# Patient Record
Sex: Female | Born: 1935 | Race: White | Hispanic: No | Smoking: Never smoker
Health system: Southern US, Community
[De-identification: ages and names within clinical notes are randomized; demographics above are authoritative.]

## PROBLEM LIST (undated history)

## (undated) DIAGNOSIS — I10 Essential (primary) hypertension: Secondary | ICD-10-CM

## (undated) DIAGNOSIS — E079 Disorder of thyroid, unspecified: Secondary | ICD-10-CM

## (undated) DIAGNOSIS — K219 Gastro-esophageal reflux disease without esophagitis: Secondary | ICD-10-CM

## (undated) DIAGNOSIS — M1990 Unspecified osteoarthritis, unspecified site: Secondary | ICD-10-CM

## (undated) DIAGNOSIS — C801 Malignant (primary) neoplasm, unspecified: Secondary | ICD-10-CM

## (undated) HISTORY — DX: Gastro-esophageal reflux disease without esophagitis: K21.9

## (undated) HISTORY — DX: Essential (primary) hypertension: I10

## (undated) HISTORY — DX: Malignant (primary) neoplasm, unspecified: C80.1

## (undated) HISTORY — DX: Disorder of thyroid, unspecified: E07.9

## (undated) HISTORY — PX: INTRACAPSULAR CATARACT EXTRACTION: SHX361

## (undated) HISTORY — DX: Unspecified osteoarthritis, unspecified site: M19.90

## (undated) HISTORY — PX: ANKLE SURGERY: SHX546

---

## 1992-09-16 HISTORY — PX: MASTECTOMY: SHX3

## 2000-04-02 ENCOUNTER — Encounter: Admission: RE | Admit: 2000-04-02 | Discharge: 2000-04-02 | Payer: Self-pay | Admitting: *Deleted

## 2000-04-02 ENCOUNTER — Encounter: Payer: Self-pay | Admitting: *Deleted

## 2001-04-02 ENCOUNTER — Encounter: Payer: Self-pay | Admitting: *Deleted

## 2001-04-02 ENCOUNTER — Encounter: Admission: RE | Admit: 2001-04-02 | Discharge: 2001-04-02 | Payer: Self-pay | Admitting: *Deleted

## 2002-03-15 ENCOUNTER — Ambulatory Visit (HOSPITAL_COMMUNITY): Admission: RE | Admit: 2002-03-15 | Discharge: 2002-03-15 | Payer: Self-pay | Admitting: Oncology

## 2002-03-15 ENCOUNTER — Encounter (HOSPITAL_COMMUNITY): Payer: Self-pay | Admitting: Oncology

## 2002-04-05 ENCOUNTER — Encounter: Admission: RE | Admit: 2002-04-05 | Discharge: 2002-04-05 | Payer: Self-pay | Admitting: Oncology

## 2002-04-05 ENCOUNTER — Encounter (HOSPITAL_COMMUNITY): Payer: Self-pay | Admitting: Oncology

## 2003-04-07 ENCOUNTER — Encounter: Admission: RE | Admit: 2003-04-07 | Discharge: 2003-04-07 | Payer: Self-pay | Admitting: Internal Medicine

## 2003-04-07 ENCOUNTER — Encounter: Payer: Self-pay | Admitting: Internal Medicine

## 2004-01-28 ENCOUNTER — Emergency Department (HOSPITAL_COMMUNITY): Admission: EM | Admit: 2004-01-28 | Discharge: 2004-01-28 | Payer: Self-pay | Admitting: Emergency Medicine

## 2004-04-09 ENCOUNTER — Encounter: Admission: RE | Admit: 2004-04-09 | Discharge: 2004-04-09 | Payer: Self-pay | Admitting: Oncology

## 2005-03-20 ENCOUNTER — Ambulatory Visit: Payer: Self-pay | Admitting: Oncology

## 2005-04-11 ENCOUNTER — Encounter: Admission: RE | Admit: 2005-04-11 | Discharge: 2005-04-11 | Payer: Self-pay | Admitting: Internal Medicine

## 2005-06-10 ENCOUNTER — Ambulatory Visit: Payer: Self-pay | Admitting: Internal Medicine

## 2005-06-10 ENCOUNTER — Other Ambulatory Visit: Admission: RE | Admit: 2005-06-10 | Discharge: 2005-06-10 | Payer: Self-pay | Admitting: Internal Medicine

## 2005-06-10 ENCOUNTER — Encounter: Payer: Self-pay | Admitting: Internal Medicine

## 2005-07-24 ENCOUNTER — Ambulatory Visit: Payer: Self-pay | Admitting: Internal Medicine

## 2005-11-06 ENCOUNTER — Ambulatory Visit: Payer: Self-pay | Admitting: Family Medicine

## 2006-03-12 ENCOUNTER — Ambulatory Visit: Payer: Self-pay | Admitting: Oncology

## 2006-03-25 LAB — CBC WITH DIFFERENTIAL/PLATELET
BASO%: 1 % (ref 0.0–2.0)
Eosinophils Absolute: 0.2 10*3/uL (ref 0.0–0.5)
HCT: 36.3 % (ref 34.8–46.6)
LYMPH%: 25.2 % (ref 14.0–48.0)
MCHC: 33.5 g/dL (ref 32.0–36.0)
MONO#: 0.4 10*3/uL (ref 0.1–0.9)
NEUT#: 3.4 10*3/uL (ref 1.5–6.5)
NEUT%: 62.8 % (ref 39.6–76.8)
Platelets: 377 10*3/uL (ref 145–400)
WBC: 5.4 10*3/uL (ref 3.9–10.0)
lymph#: 1.3 10*3/uL (ref 0.9–3.3)

## 2006-03-25 LAB — LACTATE DEHYDROGENASE: LDH: 167 U/L (ref 94–250)

## 2006-03-25 LAB — COMPREHENSIVE METABOLIC PANEL
ALT: 15 U/L (ref 0–40)
CO2: 23 mEq/L (ref 19–32)
Calcium: 8.8 mg/dL (ref 8.4–10.5)
Chloride: 108 mEq/L (ref 96–112)
Glucose, Bld: 94 mg/dL (ref 70–99)
Sodium: 141 mEq/L (ref 135–145)
Total Protein: 7.2 g/dL (ref 6.0–8.3)

## 2006-04-22 ENCOUNTER — Encounter: Admission: RE | Admit: 2006-04-22 | Discharge: 2006-04-22 | Payer: Self-pay | Admitting: Oncology

## 2006-04-30 ENCOUNTER — Encounter: Admission: RE | Admit: 2006-04-30 | Discharge: 2006-04-30 | Payer: Self-pay | Admitting: Oncology

## 2006-06-10 ENCOUNTER — Ambulatory Visit: Payer: Self-pay | Admitting: Internal Medicine

## 2007-03-16 ENCOUNTER — Encounter: Payer: Self-pay | Admitting: Internal Medicine

## 2007-03-16 DIAGNOSIS — Z853 Personal history of malignant neoplasm of breast: Secondary | ICD-10-CM

## 2007-03-16 DIAGNOSIS — M810 Age-related osteoporosis without current pathological fracture: Secondary | ICD-10-CM

## 2007-03-16 DIAGNOSIS — K219 Gastro-esophageal reflux disease without esophagitis: Secondary | ICD-10-CM

## 2007-03-16 DIAGNOSIS — E039 Hypothyroidism, unspecified: Secondary | ICD-10-CM

## 2007-03-16 DIAGNOSIS — M1990 Unspecified osteoarthritis, unspecified site: Secondary | ICD-10-CM

## 2007-03-19 ENCOUNTER — Ambulatory Visit: Payer: Self-pay | Admitting: Oncology

## 2007-03-24 ENCOUNTER — Ambulatory Visit (HOSPITAL_COMMUNITY): Admission: RE | Admit: 2007-03-24 | Discharge: 2007-03-24 | Payer: Self-pay | Admitting: Oncology

## 2007-03-24 LAB — CBC WITH DIFFERENTIAL/PLATELET
BASO%: 0.8 % (ref 0.0–2.0)
EOS%: 3.6 % (ref 0.0–7.0)
MCH: 28.5 pg (ref 26.0–34.0)
MCHC: 33.9 g/dL (ref 32.0–36.0)
MONO#: 0.5 10*3/uL (ref 0.1–0.9)
RBC: 4.27 10*6/uL (ref 3.70–5.32)
RDW: 14.9 % — ABNORMAL HIGH (ref 11.3–14.5)
WBC: 6.2 10*3/uL (ref 3.9–10.0)
lymph#: 1.2 10*3/uL (ref 0.9–3.3)

## 2007-03-24 LAB — COMPREHENSIVE METABOLIC PANEL
ALT: 16 U/L (ref 0–35)
AST: 28 U/L (ref 0–37)
Albumin: 4.2 g/dL (ref 3.5–5.2)
Calcium: 9.2 mg/dL (ref 8.4–10.5)
Chloride: 108 mEq/L (ref 96–112)
Creatinine, Ser: 1.47 mg/dL — ABNORMAL HIGH (ref 0.40–1.20)
Potassium: 4.4 mEq/L (ref 3.5–5.3)
Sodium: 140 mEq/L (ref 135–145)
Total Protein: 6.9 g/dL (ref 6.0–8.3)

## 2007-03-24 LAB — LACTATE DEHYDROGENASE: LDH: 183 U/L (ref 94–250)

## 2007-04-27 ENCOUNTER — Encounter: Admission: RE | Admit: 2007-04-27 | Discharge: 2007-04-27 | Payer: Self-pay | Admitting: Internal Medicine

## 2007-06-09 ENCOUNTER — Encounter: Payer: Self-pay | Admitting: Internal Medicine

## 2007-06-10 ENCOUNTER — Ambulatory Visit: Payer: Self-pay | Admitting: Internal Medicine

## 2007-07-17 ENCOUNTER — Ambulatory Visit: Payer: Self-pay | Admitting: Internal Medicine

## 2007-12-08 ENCOUNTER — Ambulatory Visit: Payer: Self-pay | Admitting: Internal Medicine

## 2007-12-08 LAB — CONVERTED CEMR LAB
ALT: 20 units/L (ref 0–35)
AST: 29 units/L (ref 0–37)
Alkaline Phosphatase: 84 units/L (ref 39–117)
Bilirubin, Direct: 0.1 mg/dL (ref 0.0–0.3)
CO2: 29 meq/L (ref 19–32)
Chloride: 106 meq/L (ref 96–112)
Cholesterol: 234 mg/dL (ref 0–200)
Creatinine, Ser: 1.4 mg/dL — ABNORMAL HIGH (ref 0.4–1.2)
Eosinophils Absolute: 0.3 10*3/uL (ref 0.0–0.6)
Eosinophils Relative: 6 % — ABNORMAL HIGH (ref 0.0–5.0)
GFR calc non Af Amer: 38 mL/min
HCT: 37.3 % (ref 36.0–46.0)
Hemoglobin: 12.1 g/dL (ref 12.0–15.0)
MCHC: 32.4 g/dL (ref 30.0–36.0)
MCV: 84.8 fL (ref 78.0–100.0)
Neutro Abs: 3.2 10*3/uL (ref 1.4–7.7)
Neutrophils Relative %: 62.1 % (ref 43.0–77.0)
Potassium: 4.5 meq/L (ref 3.5–5.1)
Sodium: 143 meq/L (ref 135–145)
TSH: 2.25 microintl units/mL (ref 0.35–5.50)
Total Bilirubin: 0.5 mg/dL (ref 0.3–1.2)
Total CHOL/HDL Ratio: 3.9
Total Protein: 6.8 g/dL (ref 6.0–8.3)
VLDL: 23 mg/dL (ref 0–40)

## 2008-03-21 ENCOUNTER — Ambulatory Visit: Payer: Self-pay | Admitting: Oncology

## 2008-03-24 ENCOUNTER — Encounter: Payer: Self-pay | Admitting: Internal Medicine

## 2008-03-24 LAB — COMPREHENSIVE METABOLIC PANEL
Alkaline Phosphatase: 87 U/L (ref 39–117)
Creatinine, Ser: 1.4 mg/dL — ABNORMAL HIGH (ref 0.40–1.20)
Glucose, Bld: 84 mg/dL (ref 70–99)
Sodium: 139 mEq/L (ref 135–145)
Total Bilirubin: 0.4 mg/dL (ref 0.3–1.2)
Total Protein: 6.8 g/dL (ref 6.0–8.3)

## 2008-03-24 LAB — CBC WITH DIFFERENTIAL/PLATELET
Eosinophils Absolute: 0.3 10*3/uL (ref 0.0–0.5)
HCT: 35.5 % (ref 34.8–46.6)
LYMPH%: 28.5 % (ref 14.0–48.0)
MCHC: 33.9 g/dL (ref 32.0–36.0)
MCV: 83 fL (ref 81.0–101.0)
MONO%: 8.8 % (ref 0.0–13.0)
NEUT#: 2.8 10*3/uL (ref 1.5–6.5)
NEUT%: 56.5 % (ref 39.6–76.8)
Platelets: 335 10*3/uL (ref 145–400)
RBC: 4.28 10*6/uL (ref 3.70–5.32)

## 2008-03-24 LAB — LACTATE DEHYDROGENASE: LDH: 184 U/L (ref 94–250)

## 2008-06-08 ENCOUNTER — Encounter: Admission: RE | Admit: 2008-06-08 | Discharge: 2008-06-08 | Payer: Self-pay | Admitting: Internal Medicine

## 2008-06-16 ENCOUNTER — Ambulatory Visit: Payer: Self-pay | Admitting: Internal Medicine

## 2008-12-06 ENCOUNTER — Ambulatory Visit: Payer: Self-pay | Admitting: Internal Medicine

## 2008-12-06 LAB — CONVERTED CEMR LAB
ALT: 24 units/L (ref 0–35)
AST: 32 units/L (ref 0–37)
Alkaline Phosphatase: 93 units/L (ref 39–117)
Basophils Relative: 1.2 % (ref 0.0–3.0)
CO2: 28 meq/L (ref 19–32)
Chloride: 111 meq/L (ref 96–112)
Eosinophils Absolute: 0.3 10*3/uL (ref 0.0–0.7)
Eosinophils Relative: 5.1 % — ABNORMAL HIGH (ref 0.0–5.0)
Glucose, Bld: 91 mg/dL (ref 70–99)
Hemoglobin: 11.7 g/dL — ABNORMAL LOW (ref 12.0–15.0)
LDL Cholesterol: 114 mg/dL — ABNORMAL HIGH (ref 0–99)
MCHC: 34 g/dL (ref 30.0–36.0)
Neutro Abs: 4 10*3/uL (ref 1.4–7.7)
Platelets: 290 10*3/uL (ref 150.0–400.0)
Potassium: 4.3 meq/L (ref 3.5–5.1)
RDW: 14.1 % (ref 11.5–14.6)
Sodium: 145 meq/L (ref 135–145)
TSH: 0.59 microintl units/mL (ref 0.35–5.50)
Total Bilirubin: 0.7 mg/dL (ref 0.3–1.2)
Total CHOL/HDL Ratio: 4
Total Protein: 6.7 g/dL (ref 6.0–8.3)
Triglycerides: 133 mg/dL (ref 0.0–149.0)
VLDL: 26.6 mg/dL (ref 0.0–40.0)

## 2009-03-28 ENCOUNTER — Ambulatory Visit: Payer: Self-pay | Admitting: Oncology

## 2009-03-30 ENCOUNTER — Encounter: Payer: Self-pay | Admitting: Internal Medicine

## 2009-03-30 LAB — COMPREHENSIVE METABOLIC PANEL
AST: 27 U/L (ref 0–37)
Albumin: 3.9 g/dL (ref 3.5–5.2)
Alkaline Phosphatase: 90 U/L (ref 39–117)
Potassium: 4.7 mEq/L (ref 3.5–5.3)
Sodium: 141 mEq/L (ref 135–145)
Total Protein: 6.5 g/dL (ref 6.0–8.3)

## 2009-03-30 LAB — CBC WITH DIFFERENTIAL/PLATELET
EOS%: 4.3 % (ref 0.0–7.0)
MCH: 27.9 pg (ref 25.1–34.0)
MCHC: 33.4 g/dL (ref 31.5–36.0)
MCV: 83.4 fL (ref 79.5–101.0)
MONO%: 6.3 % (ref 0.0–14.0)
NEUT#: 4.4 10*3/uL (ref 1.5–6.5)
RBC: 4.08 10*6/uL (ref 3.70–5.45)
RDW: 14.9 % — ABNORMAL HIGH (ref 11.2–14.5)

## 2009-06-22 ENCOUNTER — Encounter: Admission: RE | Admit: 2009-06-22 | Discharge: 2009-06-22 | Payer: Self-pay | Admitting: Internal Medicine

## 2009-07-03 ENCOUNTER — Ambulatory Visit: Payer: Self-pay | Admitting: Internal Medicine

## 2009-07-03 DIAGNOSIS — I10 Essential (primary) hypertension: Secondary | ICD-10-CM | POA: Insufficient documentation

## 2009-07-31 ENCOUNTER — Ambulatory Visit: Payer: Self-pay | Admitting: Internal Medicine

## 2009-10-31 ENCOUNTER — Ambulatory Visit: Payer: Self-pay | Admitting: Internal Medicine

## 2010-02-05 ENCOUNTER — Telehealth: Payer: Self-pay | Admitting: Internal Medicine

## 2010-03-27 ENCOUNTER — Ambulatory Visit: Payer: Self-pay | Admitting: Oncology

## 2010-03-29 ENCOUNTER — Encounter: Payer: Self-pay | Admitting: Internal Medicine

## 2010-03-29 LAB — COMPREHENSIVE METABOLIC PANEL
Alkaline Phosphatase: 64 U/L (ref 39–117)
BUN: 33 mg/dL — ABNORMAL HIGH (ref 6–23)
CO2: 19 mEq/L (ref 19–32)
Chloride: 109 mEq/L (ref 96–112)
Creatinine, Ser: 1.82 mg/dL — ABNORMAL HIGH (ref 0.40–1.20)
Total Bilirubin: 0.3 mg/dL (ref 0.3–1.2)

## 2010-03-29 LAB — CBC WITH DIFFERENTIAL/PLATELET
Basophils Absolute: 0.1 10*3/uL (ref 0.0–0.1)
EOS%: 5.6 % (ref 0.0–7.0)
Eosinophils Absolute: 0.3 10*3/uL (ref 0.0–0.5)
MONO%: 7.4 % (ref 0.0–14.0)
NEUT#: 3.6 10*3/uL (ref 1.5–6.5)
NEUT%: 63.3 % (ref 38.4–76.8)
RBC: 3.69 10*6/uL — ABNORMAL LOW (ref 3.70–5.45)
RDW: 14.1 % (ref 11.2–14.5)
lymph#: 1.3 10*3/uL (ref 0.9–3.3)

## 2010-03-29 LAB — LACTATE DEHYDROGENASE: LDH: 157 U/L (ref 94–250)

## 2010-05-02 ENCOUNTER — Ambulatory Visit: Payer: Self-pay | Admitting: Internal Medicine

## 2010-05-02 LAB — CONVERTED CEMR LAB: TSH: 1.91 microintl units/mL (ref 0.35–5.50)

## 2010-06-21 ENCOUNTER — Ambulatory Visit: Payer: Self-pay | Admitting: Internal Medicine

## 2010-06-26 ENCOUNTER — Encounter: Admission: RE | Admit: 2010-06-26 | Discharge: 2010-06-26 | Payer: Self-pay | Admitting: Oncology

## 2010-10-07 ENCOUNTER — Encounter (HOSPITAL_COMMUNITY): Payer: Self-pay | Admitting: Oncology

## 2010-10-16 NOTE — Assessment & Plan Note (Signed)
Summary: FLU SHOT // RS  Nurse Visit   Vitals Entered By: Cay Schillings LPN (October  6, 624THL 11:29 AM)  Allergies: No Known Drug Allergies  Immunizations Administered:  Influenza Vaccine # 1:    Vaccine Type: Fluvax 3+    Site: left deltoid    Mfr: GlaxoSmithKline    Dose: 0.5 ml    Route: IM    Given by: Cay Schillings LPN    Exp. Date: 03/16/2011    Lot #: [id number]    VIS given: 04/10/10 version given June 21, 2010.    Physician counseled: yes  Flu Vaccine Consent Questions:    Do you have a history of severe allergic reactions to this vaccine? no    Any prior history of allergic reactions to egg and/or gelatin? no    Do you have a sensitivity to the preservative Thimersol? no    Do you have a past history of Guillan-Barre Syndrome? no    Do you currently have an acute febrile illness? no    Have you ever had a severe reaction to latex? no    Vaccine information given and explained to patient? yes    Are you currently pregnant? no  Orders Added: 1)  Flu Vaccine 34yrs + [90658] 2)  Admin 1st Vaccine [id number]

## 2010-10-16 NOTE — Assessment & Plan Note (Signed)
Summary: 3 month rov/njr   Vital Signs:  Patient profile:   77 year old female Weight:      150 pounds Temp:     98.7 degrees F oral BP sitting:   130 / 70  (right arm) Cuff size:   regular  Vitals Entered By: Cay Schillings LPN (February 15, 624THL 11:28 AM) CC: rov , f/u on bp    CC:  rov  and f/u on bp .  History of Present Illness:  77 year old patient who is in today for general follow-up.  She has a history of treated hypertension menopausal syndrome and osteoporosis.  She is on soft, normal Synthroid for hypothyroidism.  She has gastroesophageal reflux disease.  She has done well.  She denies any cardiopulmonary complaints.  She has had no difficulty with medication intolerance.  Allergies: No Known Drug Allergies  Past History:  Past Medical History: Reviewed history from 07/03/2009 and no changes required. GERD Hypothyroidism Osteoporosis DJD Breast cancer, hx of stage IIB, moderately differentiated with 3 of a possible lymph nodes.  Status post radical mastectomy in 1994 Hypertension  Family History: Reviewed history from 06/10/2007 and no changes required. father died age 79, lung cancer mother died at 69, cardiac disease two brothers, one sister positive for cardiac disease, alcoholism  Review of Systems  The patient denies anorexia, fever, weight loss, weight gain, vision loss, decreased hearing, hoarseness, chest pain, syncope, dyspnea on exertion, peripheral edema, prolonged cough, headaches, hemoptysis, abdominal pain, melena, hematochezia, severe indigestion/heartburn, hematuria, incontinence, genital sores, muscle weakness, suspicious skin lesions, transient blindness, difficulty walking, depression, unusual weight change, abnormal bleeding, enlarged lymph nodes, angioedema, and breast masses.    Physical Exam  General:  Well-developed,well-nourished,in no acute distress; alert,appropriate and cooperative throughout examination Head:  Normocephalic  and atraumatic without obvious abnormalities. No apparent alopecia or balding. Eyes:  No corneal or conjunctival inflammation noted. EOMI. Perrla. Funduscopic exam benign, without hemorrhages, exudates or papilledema. Vision grossly normal. Mouth:  Oral mucosa and oropharynx without lesions or exudates.  Teeth in good repair. Neck:  No deformities, masses, or tenderness noted. Lungs:  Normal respiratory effort, chest expands symmetrically. Lungs are clear to auscultation, no crackles or wheezes. Heart:  Normal rate and regular rhythm. S1 and S2 normal without gallop, murmur, click, rub or other extra sounds. Abdomen:  Bowel sounds positive,abdomen soft and non-tender without masses, organomegaly or hernias noted. Msk:  No deformity or scoliosis noted of thoracic or lumbar spine.   Extremities:  No clubbing, cyanosis, edema, or deformity noted with normal full range of motion of all joints.     Impression & Recommendations:  Problem # 1:  HYPERTENSION (ICD-401.9)  Her updated medication list for this problem includes:    Lisinopril-hydrochlorothiazide 20-25 Mg Tabs (Lisinopril-hydrochlorothiazide) ..... One daily  Her updated medication list for this problem includes:    Lisinopril-hydrochlorothiazide 20-25 Mg Tabs (Lisinopril-hydrochlorothiazide) ..... One daily  Problem # 2:  DEGENERATIVE JOINT DISEASE (ICD-715.90)  Her updated medication list for this problem includes:    Aspir-low 81 Mg Tbec (Aspirin) ..... Qd  Her updated medication list for this problem includes:    Aspir-low 81 Mg Tbec (Aspirin) ..... Qd  Problem # 3:  HYPOTHYROIDISM (ICD-244.9)  Her updated medication list for this problem includes:    Synthroid 75 Mcg Tabs (Levothyroxine sodium) .Marland Kitchen... 1 once daily  Her updated medication list for this problem includes:    Synthroid 75 Mcg Tabs (Levothyroxine sodium) .Marland Kitchen... 1 once daily  Complete Medication List: 1)  Evista 60 Mg Tabs (Raloxifene hcl) .... Take 1 tablet by  mouth once a day 2)  Prevacid 30 Mg Cpdr (Lansoprazole) .... Take 1 capsule by mouth every morning 3)  Synthroid 75 Mcg Tabs (Levothyroxine sodium) .Marland Kitchen.. 1 once daily 4)  Lisinopril-hydrochlorothiazide 20-25 Mg Tabs (Lisinopril-hydrochlorothiazide) .... One daily 5)  Aspir-low 81 Mg Tbec (Aspirin) .... Qd 6)  Calcium 500 Mg Tabs (Calcium) .... Qd  Patient Instructions: 1)  Please schedule a follow-up appointment in 6 months. 2)  Limit your Sodium (Salt). 3)  It is important that you exercise regularly at least 20 minutes 5 times a week. If you develop chest pain, have severe difficulty breathing, or feel very tired , stop exercising immediately and seek medical attention. 4)  Take calcium +Vitamin D daily. Prescriptions: EVISTA 60 MG TABS (RALOXIFENE HCL) Take 1 tablet by mouth once a day  #90 x 6   Entered and Authorized by:   [name]  MD   Signed by:   [name]  MD on 10/31/2009   Method used:   Print then Give to Patient   RxID:   [id number]

## 2010-10-16 NOTE — Progress Notes (Signed)
Summary: REFILL REQUEST  Phone Note Refill Request   Refills Requested: Medication #1:  EVISTA 60 MG TABS Take 1 tablet by mouth once a day   Notes: Please send or fax to [place].  Medication #2:  PREVACID 30 MG CPDR Take 1 capsule by mouth every morning   Notes: Please send or fax to [place].  Medication #3:  LISINOPRIL-HYDROCHLOROTHIAZIDE 20-25 MG TABS one daily   Notes: Please send or fax to [place].    Initial call taken by: Duanne Moron,  Feb 05, 2010 8:31 AM    Prescriptions: LISINOPRIL-HYDROCHLOROTHIAZIDE 20-25 MG TABS (LISINOPRIL-HYDROCHLOROTHIAZIDE) one daily  #90 x 4   Entered by:   Cay Schillings LPN   Authorized by:   [name]  MD   Signed by:   Cay Schillings LPN on 075-GRM   Method used:   Faxed to ...       [facility]* (mail-order)             ,          Ph: [phone]       Fax: [phone]   RxIDHH:[id number] PREVACID 30 MG CPDR (LANSOPRAZOLE) Take 1 capsule by mouth every morning  #90 x 4   Entered by:   Cay Schillings LPN   Authorized by:   [name]  MD   Signed by:   Cay Schillings LPN on 075-GRM   Method used:   Faxed to ...       [facility]* (mail-order)             ,          Ph: [phone]       Fax: [phone]   RxIDTT:[id number] EVISTA 60 MG TABS (RALOXIFENE HCL) Take 1 tablet by mouth once a day  #90 x 4   Entered by:   Cay Schillings LPN   Authorized by:   [name]  MD   Signed by:   Cay Schillings LPN on 075-GRM   Method used:   Faxed to ...       [facility] (mail-order)             ,          Ph: [phone]       Fax: [phone]   RxID:   [id number]

## 2010-10-16 NOTE — Letter (Signed)
Summary: Mary Guzman   Imported By: Laural Benes 04/12/2010 12:18:12  _____________________________________________________________________  External Attachment:    Type:   Image     Comment:   External Document

## 2010-10-16 NOTE — Assessment & Plan Note (Signed)
Summary: pt will come in fasting/njr rsc bmp/njr   Vital Signs:  Patient profile:   77 year old female Height:      63 inches Weight:      151 pounds BMI:     26.85 Temp:     97.8 degrees F oral Pulse rate:   76 / minute Pulse rhythm:   regular Resp:     16 per minute BP sitting:   120 / 68  (right arm) Cuff size:   regular  Vitals Entered By: Cay Schillings LPN (August 17, 624THL 9:31 AM) CC: cpx - doing well Is Patient Diabetic? No   CC:  cpx - doing well.  History of Present Illness: an 77 year old patient who is seen today for a comprehensive evaluation.  She is accompanied by her daughter, who has noticed some mild cognitive impairment.  She is followed annually by oncology for stage IIB breast cancer with 3 of 8 lymph nodes positive.  She is status post left mastectomy in 1994.  She has hypertension and hypothyroidism. Here for Medicare AWV:  1.   Risk factors based on Past M, S, F history:  cardiovascular risk factors include age, hypertension. 2.   Physical Activities:  fairly sedentary at 77 years of age 27.   Depression/mood: no history of depression or mood disorder 4.   Hearing: no significant impairment 5.   ADL's: requires minimal assistance and aspects of daily living.  No longer drives 6.   Fall Risk: moderate due to her age 77.   Home Safety: no problems identified 8.   Height, weight, &visual acuity:height and weight are stable.  No change in her visual acuity 9.   Counseling: calcium supplementation encouraged 10.   Labs ordered based on risk factors: recent laboratory studies done by oncology will check TSH, only today 11.           Referral Coordination-not appropriate 12.           Care Plan- continue present regimen 13.            Cognitive Assessment- daughters describes some mild forgetfulness   Allergies (verified): No Known Drug Allergies  Past History:  Past Medical History: GERD Hypothyroidism Osteoporosis DJD Breast cancer, hx of stage  IIB, moderately differentiated with 3 of 8  possible lymph nodes.  Status post radical mastectomy in 1994 Hypertension  Past Surgical History: Reviewed history from 12/06/2008 and no changes required. Mastectomy 1994 R ankle surgery gravida two, para two, aborta zero Cataract extraction  Family History: Reviewed history from 06/10/2007 and no changes required. father died age 29, lung cancer mother died at 24, cardiac disease two brothers, one sister positive for cardiac disease, alcoholism  Social History: Reviewed history from 12/06/2008 and no changes required. widowed fairly active with interaction with grandchildren  Review of Systems  The patient denies anorexia, fever, weight loss, weight gain, vision loss, decreased hearing, hoarseness, chest pain, syncope, dyspnea on exertion, peripheral edema, prolonged cough, headaches, hemoptysis, abdominal pain, melena, hematochezia, severe indigestion/heartburn, hematuria, incontinence, genital sores, muscle weakness, suspicious skin lesions, transient blindness, difficulty walking, depression, unusual weight change, abnormal bleeding, enlarged lymph nodes, angioedema, and breast masses.    Physical Exam  General:  elderly alert, no distress.  Blood pressure 120/64 Head:  Normocephalic and atraumatic without obvious abnormalities. No apparent alopecia or balding. Eyes:  No corneal or conjunctival inflammation noted. EOMI. Perrla. Funduscopic exam benign, without hemorrhages, exudates or papilledema. Vision grossly normal. Ears:  External ear exam  shows no significant lesions or deformities.  Otoscopic examination reveals clear canals, tympanic membranes are intact bilaterally without bulging, retraction, inflammation or discharge. Hearing is grossly normal bilaterally. Mouth:  Oral mucosa and oropharynx without lesions or exudates.  dentures in place Neck:  No deformities, masses, or tenderness noted. Chest Wall:  No deformities,  masses, or tenderness noted. Breasts:  left mastectomy Lungs:  Normal respiratory effort, chest expands symmetrically. Lungs are clear to auscultation, no crackles or wheezes. Heart:  Normal rate and regular rhythm. S1 and S2 normal without gallop, murmur, click, rub or other extra sounds. Abdomen:  Bowel sounds positive,abdomen soft and non-tender without masses, organomegaly or hernias noted. Rectal:  No external abnormalities noted. Normal sphincter tone. No rectal masses or tenderness. Genitalia:  atrophic changes only Msk:  No deformity or scoliosis noted of thoracic or lumbar spine.   Pulses:  the left dorsalis pedis pulse intact.  Other peripheral pulses not easily palpable Extremities:  plus one right ankle edema Neurologic:  alert & oriented X3, DTRs symmetrical and normal, and finger-to-nose normal.  alert & oriented X3, DTRs symmetrical and normal, and finger-to-nose normal.   Skin:  Intact without suspicious lesions or rashes Cervical Nodes:  No lymphadenopathy noted Axillary Nodes:  No palpable lymphadenopathy Inguinal Nodes:  No significant adenopathy Psych:  Cognition and judgment appear intact. Alert and cooperative with normal attention span and concentration. No apparent delusions, illusions, hallucinations   Impression & Recommendations:  Problem # 1:  Preventive Health Care (ICD-V70.0)  Orders: Medicare -1st Annual Wellness Visit [phone])  Complete Medication List: 1)  Evista 60 Mg Tabs (Raloxifene hcl) .... Take 1 tablet by mouth once a day 2)  Prevacid 30 Mg Cpdr (Lansoprazole) .... Take 1 capsule by mouth every morning 3)  Synthroid 75 Mcg Tabs (Levothyroxine sodium) .Marland Kitchen.. 1 once daily 4)  Lisinopril-hydrochlorothiazide 20-25 Mg Tabs (Lisinopril-hydrochlorothiazide) .... One daily 5)  Aspir-low 81 Mg Tbec (Aspirin) .... Qd 6)  Calcium 500 Mg Tabs (Calcium) .... Qd  Other Orders: EKG w/ Interpretation (93000) Venipuncture HR:[id number]) TLB-TSH (Thyroid Stimulating  Hormone) (84443-TSH) Specimen Handling (99000)  Patient Instructions: 1)  Please schedule a follow-up appointment in 6 months. 2)  Limit your Sodium (Salt). 3)  It is important that you exercise regularly at least 20 minutes 5 times a week. If you develop chest pain, have severe difficulty breathing, or feel very tired , stop exercising immediately and seek medical attention. 4)  Take calcium +Vitamin D daily. Prescriptions: LISINOPRIL-HYDROCHLOROTHIAZIDE 20-25 MG TABS (LISINOPRIL-HYDROCHLOROTHIAZIDE) one daily  #90 x 4   Entered and Authorized by:   [name]  MD   Signed by:   [name]  MD on 05/02/2010   Method used:   Faxed to ...       [facility] ([facility])             , [place]         Ph:        Fax: [phone]   RxID:   [id number] SYNTHROID 75 MCG TABS (LEVOTHYROXINE SODIUM) 1 once daily  #90 x 4   Entered and Authorized by:   [name]  MD   Signed by:   [name]  MD on 05/02/2010   Method used:   Faxed to ...       [facility])             , [place]         Ph:  Fax: [phone]   RxIDIB:[id number] PREVACID 30 MG CPDR (LANSOPRAZOLE) Take 1 capsule by mouth every morning  #90 x 4   Entered and Authorized by:   [name]  MD   Signed by:   [name]  MD on 05/02/2010   Method used:   Faxed to ...       [facility] (mail-order)             , [place]         Ph:        Fax: [phone]   RxID:   [id number] EVISTA 60 MG TABS (RALOXIFENE HCL) Take 1 tablet by mouth once a day  #90 x 4   Entered and Authorized by:   [name]  MD   Signed by:   [name]  MD on 05/02/2010   Method used:   Faxed to ...       [facility] (mail-order)             , [place]         Ph:        Fax: [phone]   RxID:   [id number]

## 2010-10-30 ENCOUNTER — Encounter: Payer: Self-pay | Admitting: Internal Medicine

## 2010-10-31 ENCOUNTER — Encounter: Payer: Self-pay | Admitting: Internal Medicine

## 2010-10-31 ENCOUNTER — Ambulatory Visit (INDEPENDENT_AMBULATORY_CARE_PROVIDER_SITE_OTHER): Payer: Medicare Other | Admitting: Internal Medicine

## 2010-10-31 DIAGNOSIS — M810 Age-related osteoporosis without current pathological fracture: Secondary | ICD-10-CM

## 2010-10-31 DIAGNOSIS — K219 Gastro-esophageal reflux disease without esophagitis: Secondary | ICD-10-CM

## 2010-10-31 DIAGNOSIS — E039 Hypothyroidism, unspecified: Secondary | ICD-10-CM

## 2010-10-31 DIAGNOSIS — I10 Essential (primary) hypertension: Secondary | ICD-10-CM

## 2010-10-31 MED ORDER — LEVOTHYROXINE SODIUM 75 MCG PO TABS: 75.0000 ug | ORAL_TABLET | Freq: Every day | ORAL | Status: DC

## 2010-10-31 MED ORDER — LANSOPRAZOLE 30 MG PO CPDR: 30.0000 mg | DELAYED_RELEASE_CAPSULE | Freq: Every day | ORAL | Status: DC

## 2010-10-31 MED ORDER — LISINOPRIL-HYDROCHLOROTHIAZIDE 20-25 MG PO TABS: 1.0000 | ORAL_TABLET | Freq: Every day | ORAL | Status: DC

## 2010-10-31 MED ORDER — RALOXIFENE HCL 60 MG PO TABS: 60.0000 mg | ORAL_TABLET | Freq: Every day | ORAL | Status: DC

## 2010-10-31 NOTE — Progress Notes (Signed)
  Subjective:    Patient ID: Mary Guzman, female    DOB: [birth year], 77 y.o.   MRN: [id number]  HPI  An 77 year old patient who is seen today for follow-up of her multiple medical problems.  She has treated hypertension, which has done wel lon combination therapy of lisinopril thiazide diuretic.  She tracks home blood pressure readings with very nice results. She has gastroesophageal reflux disease, controlled on PPI therapy.  She is asymptomatic. She has hypothyroidism on supplemental medication.  Last TSH was normal. She has osteoarthritis and osteoporosis and remains on Evista.   Past Medical History  Diagnosis Date  . GERD (gastroesophageal reflux disease)   . Thyroid disease     Hypothyroidism  . Osteoporosis   . DJD (degenerative joint disease)   . 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  . Hypertension    Past Surgical History  Procedure Date  . Mastectomy 1994  . Ankle surgery     Right  . Intracapsular cataract extraction     reports that she has never smoked. She does not have any smokeless tobacco history on file. Her alcohol and drug histories not on file. family history includes Alcohol abuse in her brother and Asthma in her son.       Review of Systems  Constitutional: Negative.   HENT: Negative for hearing loss, congestion, sore throat, rhinorrhea, dental problem, sinus pressure and tinnitus.   Eyes: Negative for pain, discharge and visual disturbance.  Respiratory: Negative for cough and shortness of breath.   Cardiovascular: Negative for chest pain, palpitations and leg swelling.  Gastrointestinal: Negative for nausea, vomiting, abdominal pain, diarrhea, constipation, blood in stool and abdominal distention.  Genitourinary: Negative for dysuria, urgency, frequency, hematuria, flank pain, vaginal bleeding, vaginal discharge, difficulty urinating, vaginal pain and pelvic pain.  Musculoskeletal: Negative  for joint swelling, arthralgias and gait problem.  Skin: Negative for rash.  Neurological: Negative for dizziness, syncope, speech difficulty, weakness, numbness and headaches.  Hematological: Negative for adenopathy.  Psychiatric/Behavioral: Negative for behavioral problems, dysphoric mood and agitation. The patient is not nervous/anxious.        Objective:   Physical Exam  Constitutional: She is oriented to person, place, and time. She appears well-developed and well-nourished.  HENT:  Head: Normocephalic.  Right Ear: External ear normal.  Left Ear: External ear normal.  Mouth/Throat: Oropharynx is clear and moist.  Eyes: Conjunctivae and EOM are normal. Pupils are equal, round, and reactive to light.  Neck: Normal range of motion. Neck supple. No thyromegaly present.  Cardiovascular: Normal rate, regular rhythm, normal heart sounds and intact distal pulses.   Pulmonary/Chest: Effort normal and breath sounds normal.  Abdominal: Soft. Bowel sounds are normal. She exhibits no mass. There is no tenderness.  Musculoskeletal: Normal range of motion.  Lymphadenopathy:    She has no cervical adenopathy.  Neurological: She is alert and oriented to person, place, and time.  Skin: Skin is warm and dry. No rash noted.  Psychiatric: She has a normal mood and affect. Her behavior is normal.          Assessment & Plan:  Hypertension- stable.  Will continue present regimen.  Medications refill Hypothyroidism.  Levothyroxine refill Osteoporosis.  Will continue the Evista.  Medications refill Gastroesophageal reflux disease-

## 2010-10-31 NOTE — Patient Instructions (Signed)
Limit your sodium (Salt) intake   It is important that you exercise regularly, at least 20 minutes 3 to 4 times per week.  If you develop chest pain or shortness of breath seek  medical attention. Take a calcium supplement, plus [phone] units of vitamin D  Return in 6 months for follow-up  Avoids foods high in acid such as tomatoes citrus juices, and spicy foods.  Avoid eating within two hours of lying down or before exercising.  Do not overheat.  Try smaller more frequent meals.  If symptoms persist, elevate the head of her bed 12 inches while sleeping.

## 2011-02-01 NOTE — Assessment & Plan Note (Signed)
[facility] OFFICE NOTE   NAME:Mary Guzman, Mary Guzman                        MRN:          [id number]  DATE:06/10/2006                            DOB:          July 15, 1921    The patient is an 77 year old female, seen today for a comprehensive exam.   MEDICAL PROBLEMS:  1. Hypothyroidism.  2. Osteoporosis.  3. Remote history of breast cancer in 1994.  4. She has gastroesophageal reflux disease.  5. She is gravida 2, para 2, abortus 0.   FAMILY HISTORY:  Positive for lung cancer.   PHYSICAL EXAMINATION:  Exam reveals an elderly white female in no acute  distress.  Blood pressure was normal.  SKIN:  Unremarkable.  HEAD AND NECK:  Normal fundi.  Ears, nose and throat clear.  The patient was  edentulous.  NECK:  No bruits.  CHEST:  Clear.  The patient had a left mastectomy, right breast  unremarkable.  CARDIOVASCULAR:  Normal heart sounds without murmur.  ABDOMEN:  Benign, no organomegaly.  PELVIC EXAM:  No adnexal masses.  Stool heme negative.  EXTREMITIES:  Revealed intact peripheral pulses.  There is trace edema.  NEUROLOGIC:  Negative.   IMPRESSION:  1. Degenerative joint disease.  2. Osteoporosis.  3. Hypothyroidism.  4. Chronic reflux.   DISPOSITION:  Medical regimen unchanged.  Laboratory update reviewed.  We  will reassess in 1 year.  Flu vaccine administered today.                                   [name], MD   PFK/MedQ  DD:  06/10/2006  DT:  06/12/2006  Job #:  [id number]

## 2011-04-01 ENCOUNTER — Other Ambulatory Visit (HOSPITAL_COMMUNITY): Payer: Self-pay | Admitting: Oncology

## 2011-04-01 ENCOUNTER — Encounter (HOSPITAL_BASED_OUTPATIENT_CLINIC_OR_DEPARTMENT_OTHER): Payer: Medicare Other | Admitting: Oncology

## 2011-04-01 DIAGNOSIS — R030 Elevated blood-pressure reading, without diagnosis of hypertension: Secondary | ICD-10-CM

## 2011-04-01 DIAGNOSIS — Z853 Personal history of malignant neoplasm of breast: Secondary | ICD-10-CM

## 2011-04-01 DIAGNOSIS — M899 Disorder of bone, unspecified: Secondary | ICD-10-CM

## 2011-04-01 DIAGNOSIS — D638 Anemia in other chronic diseases classified elsewhere: Secondary | ICD-10-CM

## 2011-04-01 DIAGNOSIS — N19 Unspecified kidney failure: Secondary | ICD-10-CM

## 2011-04-01 LAB — CBC WITH DIFFERENTIAL/PLATELET
BASO%: 0.9 % (ref 0.0–2.0)
EOS%: 5.4 % (ref 0.0–7.0)
HCT: 30.7 % — ABNORMAL LOW (ref 34.8–46.6)
HGB: 10.4 g/dL — ABNORMAL LOW (ref 11.6–15.9)
LYMPH%: 22 % (ref 14.0–49.7)
MCHC: 34 g/dL (ref 31.5–36.0)
MONO%: 9.3 % (ref 0.0–14.0)
RBC: 3.4 10*6/uL — ABNORMAL LOW (ref 3.70–5.45)
WBC: 6.5 10*3/uL (ref 3.9–10.3)

## 2011-04-01 LAB — COMPREHENSIVE METABOLIC PANEL
BUN: 36 mg/dL — ABNORMAL HIGH (ref 6–23)
CO2: 18 mEq/L — ABNORMAL LOW (ref 19–32)
Calcium: 8.7 mg/dL (ref 8.4–10.5)
Chloride: 107 mEq/L (ref 96–112)
Creatinine, Ser: 1.88 mg/dL — ABNORMAL HIGH (ref 0.50–1.10)
Glucose, Bld: 88 mg/dL (ref 70–99)

## 2011-04-01 LAB — LACTATE DEHYDROGENASE: LDH: 172 U/L (ref 94–250)

## 2011-04-09 ENCOUNTER — Ambulatory Visit
Admission: RE | Admit: 2011-04-09 | Discharge: 2011-04-09 | Disposition: A | Discharge status: Home / Self Care | Payer: Medicare Other | Source: Ambulatory Visit | Attending: Oncology | Admitting: Oncology

## 2011-04-09 DIAGNOSIS — Z853 Personal history of malignant neoplasm of breast: Secondary | ICD-10-CM

## 2011-05-01 ENCOUNTER — Ambulatory Visit (INDEPENDENT_AMBULATORY_CARE_PROVIDER_SITE_OTHER): Payer: Medicare Other | Admitting: Internal Medicine

## 2011-05-01 ENCOUNTER — Encounter: Payer: Self-pay | Admitting: Internal Medicine

## 2011-05-01 DIAGNOSIS — M1990 Unspecified osteoarthritis, unspecified site: Secondary | ICD-10-CM

## 2011-05-01 DIAGNOSIS — E039 Hypothyroidism, unspecified: Secondary | ICD-10-CM

## 2011-05-01 DIAGNOSIS — Z853 Personal history of malignant neoplasm of breast: Secondary | ICD-10-CM

## 2011-05-01 DIAGNOSIS — I10 Essential (primary) hypertension: Secondary | ICD-10-CM

## 2011-05-01 DIAGNOSIS — K219 Gastro-esophageal reflux disease without esophagitis: Secondary | ICD-10-CM

## 2011-05-01 DIAGNOSIS — M810 Age-related osteoporosis without current pathological fracture: Secondary | ICD-10-CM

## 2011-05-01 MED ORDER — RALOXIFENE HCL 60 MG PO TABS: 60.0000 mg | ORAL_TABLET | Freq: Every day | ORAL | Status: DC

## 2011-05-01 MED ORDER — LEVOTHYROXINE SODIUM 75 MCG PO TABS: 75.0000 ug | ORAL_TABLET | Freq: Every day | ORAL | Status: DC

## 2011-05-01 MED ORDER — LISINOPRIL-HYDROCHLOROTHIAZIDE 20-25 MG PO TABS: 1.0000 | ORAL_TABLET | Freq: Every day | ORAL | Status: DC

## 2011-05-01 MED ORDER — LANSOPRAZOLE 30 MG PO CPDR: 30.0000 mg | DELAYED_RELEASE_CAPSULE | Freq: Every day | ORAL | Status: DC

## 2011-05-01 NOTE — Patient Instructions (Signed)
Limit your sodium (Salt) intake    It is important that you exercise regularly, at least 20 minutes 3 to 4 times per week.  If you develop chest pain or shortness of breath seek  medical attention.  Return in 6 months for follow-up  

## 2011-05-02 ENCOUNTER — Encounter: Payer: Self-pay | Admitting: Internal Medicine

## 2011-05-02 NOTE — Progress Notes (Signed)
  Subjective:    Patient ID: Mary Guzman, female    DOB: Apr 04, 1921, 77 y.o.   MRN: [id number]  HPI  77 year old patient who is seen today for her biannual followup. She has a history of treated hypothyroidism as well as treated hypertension. She has done quite well. She remains on the this to her history of breast cancer and osteoporosis. She is on calcium and vitamin D supplements. She has a history of gastroesophageal reflux disease which has been stable. She has done well without any new concerns or complaints.    Review of Systems  Constitutional: Negative.   HENT: Negative for hearing loss, congestion, sore throat, rhinorrhea, dental problem, sinus pressure and tinnitus.   Eyes: Negative for pain, discharge and visual disturbance.  Respiratory: Negative for cough and shortness of breath.   Cardiovascular: Negative for chest pain, palpitations and leg swelling.  Gastrointestinal: Negative for nausea, vomiting, abdominal pain, diarrhea, constipation, blood in stool and abdominal distention.  Genitourinary: Negative for dysuria, urgency, frequency, hematuria, flank pain, vaginal bleeding, vaginal discharge, difficulty urinating, vaginal pain and pelvic pain.  Musculoskeletal: Negative for joint swelling, arthralgias and gait problem.  Skin: Negative for rash.  Neurological: Negative for dizziness, syncope, speech difficulty, weakness, numbness and headaches.  Hematological: Negative for adenopathy.  Psychiatric/Behavioral: Negative for behavioral problems, dysphoric mood and agitation. The patient is not nervous/anxious.        Objective:   Physical Exam  Constitutional: She is oriented to person, place, and time. She appears well-developed and well-nourished.  HENT:  Head: Normocephalic.  Right Ear: External ear normal.  Left Ear: External ear normal.  Mouth/Throat: Oropharynx is clear and moist.  Eyes: Conjunctivae and EOM are normal. Pupils are equal, round, and reactive to  light.  Neck: Normal range of motion. Neck supple. No thyromegaly present.  Cardiovascular: Normal rate, regular rhythm, normal heart sounds and intact distal pulses.   Pulmonary/Chest: Effort normal and breath sounds normal.  Abdominal: Soft. Bowel sounds are normal. She exhibits no mass. There is no tenderness.  Musculoskeletal: Normal range of motion.  Lymphadenopathy:    She has no cervical adenopathy.  Neurological: She is alert and oriented to person, place, and time.  Skin: Skin is warm and dry. No rash noted.  Psychiatric: She has a normal mood and affect. Her behavior is normal.          Assessment & Plan:   Osteoporosis. We'll continue the Evista calcium and vitamin D supplementation Hypertension. Well controlled we'll continue her present antihypertensive regimen. Hypothyroidism. We'll check a TSH. Gastroesophageal reflux disease stable on present regimen  We'll recheck in 6 months for her annual physical

## 2011-06-11 ENCOUNTER — Other Ambulatory Visit (HOSPITAL_COMMUNITY): Payer: Self-pay | Admitting: Oncology

## 2011-06-11 DIAGNOSIS — Z1231 Encounter for screening mammogram for malignant neoplasm of breast: Secondary | ICD-10-CM

## 2011-06-18 ENCOUNTER — Other Ambulatory Visit: Payer: Self-pay | Admitting: Internal Medicine

## 2011-06-19 ENCOUNTER — Other Ambulatory Visit: Payer: Self-pay | Admitting: Internal Medicine

## 2011-06-27 ENCOUNTER — Ambulatory Visit (INDEPENDENT_AMBULATORY_CARE_PROVIDER_SITE_OTHER): Payer: Medicare Other

## 2011-06-27 DIAGNOSIS — Z23 Encounter for immunization: Secondary | ICD-10-CM

## 2011-07-02 ENCOUNTER — Ambulatory Visit
Admission: RE | Admit: 2011-07-02 | Discharge: 2011-07-02 | Disposition: A | Discharge status: Home / Self Care | Payer: Medicare Other | Source: Ambulatory Visit | Attending: Oncology | Admitting: Oncology

## 2011-07-02 DIAGNOSIS — Z1231 Encounter for screening mammogram for malignant neoplasm of breast: Secondary | ICD-10-CM

## 2011-10-24 ENCOUNTER — Telehealth: Payer: Self-pay | Admitting: Internal Medicine

## 2011-10-24 MED ORDER — LISINOPRIL-HYDROCHLOROTHIAZIDE 20-25 MG PO TABS: 1.0000 | ORAL_TABLET | Freq: Every day | ORAL | Status: DC

## 2011-10-24 NOTE — Telephone Encounter (Signed)
90 day supply sent to [facility].  14 day supply sent to [facility].

## 2011-10-24 NOTE — Telephone Encounter (Signed)
Pts mail order pharmacy has changed from [facility] to [facility]. Pt is needing new script for Lisinopril/HCTZ 20-25 mg to be sent to [facility]. Their phone # is [phone].  Pt also needs a script sent [place], [place], [place] [phone], to last until pts mail order arrives, because pt only has 1 pill left.

## 2011-10-30 ENCOUNTER — Encounter: Payer: Self-pay | Admitting: Internal Medicine

## 2011-10-30 ENCOUNTER — Ambulatory Visit (INDEPENDENT_AMBULATORY_CARE_PROVIDER_SITE_OTHER): Payer: Medicare Other | Admitting: Internal Medicine

## 2011-10-30 DIAGNOSIS — E039 Hypothyroidism, unspecified: Secondary | ICD-10-CM

## 2011-10-30 DIAGNOSIS — I10 Essential (primary) hypertension: Secondary | ICD-10-CM

## 2011-10-30 MED ORDER — LANSOPRAZOLE 30 MG PO CPDR: 30.0000 mg | DELAYED_RELEASE_CAPSULE | Freq: Every day | ORAL | Status: DC

## 2011-10-30 MED ORDER — LEVOTHYROXINE SODIUM 75 MCG PO TABS: 75.0000 ug | ORAL_TABLET | Freq: Every day | ORAL | Status: DC

## 2011-10-30 MED ORDER — RALOXIFENE HCL 60 MG PO TABS: 60.0000 mg | ORAL_TABLET | Freq: Every day | ORAL | Status: DC

## 2011-10-30 MED ORDER — LISINOPRIL-HYDROCHLOROTHIAZIDE 20-25 MG PO TABS: 1.0000 | ORAL_TABLET | Freq: Every day | ORAL | Status: DC

## 2011-10-30 NOTE — Progress Notes (Signed)
  Subjective:    Patient ID: Mary Guzman, female    DOB: Mar 18, 1921, 78 y.o.   MRN: [id number]  HPI  78 year old patient who is seen today for followup. She has a history of treated hypothyroidism as well as hypertension. She is doing markedly well at almost 78 years of age. She is scheduled for oncology followup in a few months. No concerns or complaints. She also takes Prevacid for mild reflux symptoms    Review of Systems  Constitutional: Negative.   HENT: Negative for hearing loss, congestion, sore throat, rhinorrhea, dental problem, sinus pressure and tinnitus.   Eyes: Negative for pain, discharge and visual disturbance.  Respiratory: Negative for cough and shortness of breath.   Cardiovascular: Negative for chest pain, palpitations and leg swelling.  Gastrointestinal: Negative for nausea, vomiting, abdominal pain, diarrhea, constipation, blood in stool and abdominal distention.  Genitourinary: Negative for dysuria, urgency, frequency, hematuria, flank pain, vaginal bleeding, vaginal discharge, difficulty urinating, vaginal pain and pelvic pain.  Musculoskeletal: Negative for joint swelling, arthralgias and gait problem.  Skin: Negative for rash.  Neurological: Negative for dizziness, syncope, speech difficulty, weakness, numbness and headaches.  Hematological: Negative for adenopathy.  Psychiatric/Behavioral: Negative for behavioral problems, dysphoric mood and agitation. The patient is not nervous/anxious.        Objective:   Physical Exam  Constitutional: She is oriented to person, place, and time. She appears well-developed and well-nourished.  HENT:  Head: Normocephalic.  Right Ear: External ear normal.  Left Ear: External ear normal.  Mouth/Throat: Oropharynx is clear and moist.  Eyes: Conjunctivae and EOM are normal. Pupils are equal, round, and reactive to light.        anicteric  Neck: Normal range of motion. Neck supple. No thyromegaly present.  Cardiovascular:  Normal rate, regular rhythm, normal heart sounds and intact distal pulses.   Pulmonary/Chest: Effort normal and breath sounds normal.  Abdominal: Soft. Bowel sounds are normal. She exhibits no mass. There is no tenderness.  Musculoskeletal: Normal range of motion.  Lymphadenopathy:    She has no cervical adenopathy.  Neurological: She is alert and oriented to person, place, and time.  Skin: Skin is warm and dry. No rash noted.       Skin appeared mildly jaundiced but no scleral icterus. This was noted 6 months ago 2 weeks following laboratory studies that revealed a normal bilirubin  Psychiatric: She has a normal mood and affect. Her behavior is normal.          Assessment & Plan:   Hypertension stable. We'll continue present regimen medications refilled Hypothyroidism medication refilled samples provided Gastroesophageal reflux disease stable on Prevacid  Followup oncology Low-salt diet and regular exercise encouraged Recheck 6 months

## 2011-10-30 NOTE — Patient Instructions (Signed)
Limit your sodium (Salt) intake    It is important that you exercise regularly, at least 20 minutes 3 to 4 times per week.  If you develop chest pain or shortness of breath seek  medical attention.  Return in 6 months for follow-up  

## 2012-01-30 ENCOUNTER — Other Ambulatory Visit: Payer: Self-pay

## 2012-01-30 MED ORDER — RALOXIFENE HCL 60 MG PO TABS: 60.0000 mg | ORAL_TABLET | Freq: Every day | ORAL | Status: DC

## 2012-01-30 MED ORDER — LISINOPRIL-HYDROCHLOROTHIAZIDE 20-25 MG PO TABS: 1.0000 | ORAL_TABLET | Freq: Every day | ORAL | Status: DC

## 2012-01-30 MED ORDER — LEVOTHYROXINE SODIUM 75 MCG PO TABS: 75.0000 ug | ORAL_TABLET | Freq: Every day | ORAL | Status: DC

## 2012-01-30 MED ORDER — LANSOPRAZOLE 30 MG PO CPDR: 30.0000 mg | DELAYED_RELEASE_CAPSULE | Freq: Every day | ORAL | Status: DC

## 2012-01-30 NOTE — Telephone Encounter (Signed)
meds RF to [facility]

## 2012-03-24 ENCOUNTER — Telehealth: Payer: Self-pay | Admitting: Oncology

## 2012-03-24 NOTE — Telephone Encounter (Signed)
s/w pt and moved her 7/23 appt to 8/29 due to tx pt         aom

## 2012-04-07 ENCOUNTER — Ambulatory Visit: Payer: Medicare Other | Admitting: Oncology

## 2012-04-07 ENCOUNTER — Other Ambulatory Visit: Payer: Medicare Other | Admitting: Lab

## 2012-04-21 ENCOUNTER — Ambulatory Visit: Payer: Medicare Other | Admitting: Internal Medicine

## 2012-04-28 ENCOUNTER — Ambulatory Visit: Payer: Medicare Other | Admitting: Internal Medicine

## 2012-05-07 ENCOUNTER — Ambulatory Visit (INDEPENDENT_AMBULATORY_CARE_PROVIDER_SITE_OTHER): Payer: Medicare Other | Admitting: Internal Medicine

## 2012-05-07 ENCOUNTER — Encounter: Payer: Self-pay | Admitting: Internal Medicine

## 2012-05-07 VITALS — BP 122/82 | HR 60 | Temp 98.5°F | Wt 153.0 lb

## 2012-05-07 DIAGNOSIS — K219 Gastro-esophageal reflux disease without esophagitis: Secondary | ICD-10-CM

## 2012-05-07 DIAGNOSIS — M810 Age-related osteoporosis without current pathological fracture: Secondary | ICD-10-CM

## 2012-05-07 DIAGNOSIS — M1990 Unspecified osteoarthritis, unspecified site: Secondary | ICD-10-CM

## 2012-05-07 DIAGNOSIS — I10 Essential (primary) hypertension: Secondary | ICD-10-CM

## 2012-05-07 DIAGNOSIS — E039 Hypothyroidism, unspecified: Secondary | ICD-10-CM

## 2012-05-07 MED ORDER — LISINOPRIL-HYDROCHLOROTHIAZIDE 20-25 MG PO TABS: 1.0000 | ORAL_TABLET | Freq: Every day | ORAL | Status: DC

## 2012-05-07 MED ORDER — RALOXIFENE HCL 60 MG PO TABS: 60.0000 mg | ORAL_TABLET | Freq: Every day | ORAL | Status: DC

## 2012-05-07 MED ORDER — LANSOPRAZOLE 30 MG PO CPDR: 30.0000 mg | DELAYED_RELEASE_CAPSULE | Freq: Every day | ORAL | Status: DC

## 2012-05-07 MED ORDER — LEVOTHYROXINE SODIUM 75 MCG PO TABS: 75.0000 ug | ORAL_TABLET | Freq: Every day | ORAL | Status: DC

## 2012-05-07 NOTE — Progress Notes (Signed)
Subjective:    Patient ID: [name], female    DOB: 10/20/1920, 78 y.o.   MRN: [id number]  HPI  78 year old patient who is seen today for her annual followup. Medical problems include treated hypertension and hypothyroidism. She is doing quite well and is accompanied by her daughter. She is scheduled for annual oncology visit for followup of breast cancer soon. No real concerns or complaints.  Past Medical History  Diagnosis Date  . GERD (gastroesophageal reflux disease)   . Thyroid disease     Hypothyroidism  . Osteoporosis   . DJD (degenerative joint disease)   . 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  . Hypertension     History   Social History  . Marital Status: Single    Spouse Name: N/A    Number of Children: N/A  . Years of Education: N/A   Occupational History  . Not on file.   Social History Main Topics  . Smoking status: Never Smoker   . Smokeless tobacco: Not on file  . Alcohol Use: Not on file  . Drug Use: Not on file  . Sexually Active: Not on file   Other Topics Concern  . Not on file   Social History Narrative   Fairly active interaction with grandchildren.    Past Surgical History  Procedure Date  . Mastectomy 1994  . Ankle surgery     Right  . Intracapsular cataract extraction     Family History  Problem Relation Age of Onset  . Alcohol abuse Brother   . Asthma Son     No Known Allergies  Current Outpatient Prescriptions on File Prior to Visit  Medication Sig Dispense Refill  . aspirin 81 MG EC tablet Take 81 mg by mouth daily.        . lansoprazole (PREVACID) 30 MG capsule Take 1 capsule (30 mg total) by mouth daily.  90 capsule  3  . lisinopril-hydrochlorothiazide (PRINZIDE,ZESTORETIC) 20-25 MG per tablet Take 1 tablet by mouth daily.  90 tablet  3  . raloxifene (EVISTA) 60 MG tablet Take 1 tablet (60 mg total) by mouth daily.  90 tablet  3  . Calcium Carbonate (CALCIUM 500  PO) Take 1 tablet by mouth daily.        Marland Kitchen levothyroxine (SYNTHROID, LEVOTHROID) 75 MCG tablet Take 1 tablet (75 mcg total) by mouth daily.  90 tablet  3    BP 122/82  Pulse 60  Temp 98.5 F (36.9 C) (Oral)  Wt 153 lb (69.4 kg)  SpO2 95%       Review of Systems  Constitutional: Negative.   HENT: Negative for hearing loss, congestion, sore throat, rhinorrhea, dental problem, sinus pressure and tinnitus.   Eyes: Negative for pain, discharge and visual disturbance.  Respiratory: Negative for cough and shortness of breath.   Cardiovascular: Negative for chest pain, palpitations and leg swelling.  Gastrointestinal: Negative for nausea, vomiting, abdominal pain, diarrhea, constipation, blood in stool and abdominal distention.  Genitourinary: Negative for dysuria, urgency, frequency, hematuria, flank pain, vaginal bleeding, vaginal discharge, difficulty urinating, vaginal pain and pelvic pain.  Musculoskeletal: Positive for gait problem. Negative for joint swelling and arthralgias.  Skin: Negative for rash.  Neurological: Negative for dizziness, syncope, speech difficulty, weakness, numbness and headaches.  Hematological: Negative for adenopathy.  Psychiatric/Behavioral: Negative for behavioral problems, dysphoric mood and agitation. The patient is not nervous/anxious.  Objective:   Physical Exam  Constitutional: She is oriented to person, place, and time. She appears well-developed and well-nourished.       Blood pressure well controlled  HENT:  Head: Normocephalic.  Right Ear: External ear normal.  Left Ear: External ear normal.  Mouth/Throat: Oropharynx is clear and moist.  Eyes: Conjunctivae and EOM are normal. Pupils are equal, round, and reactive to light.  Neck: Normal range of motion. Neck supple. No thyromegaly present.  Cardiovascular: Normal rate, regular rhythm, normal heart sounds and intact distal pulses.   Pulmonary/Chest: Effort normal and breath sounds  normal.  Abdominal: Soft. Bowel sounds are normal. She exhibits no mass. There is no tenderness.  Musculoskeletal: Normal range of motion. She exhibits edema.       +1 pedal edema  Lymphadenopathy:    She has no cervical adenopathy.  Neurological: She is alert and oriented to person, place, and time.  Skin: Skin is warm and dry. No rash noted.  Psychiatric: She has a normal mood and affect. Her behavior is normal.          Assessment & Plan:   Hypertension well controlled Hypothyroidism. We'll continue levothyroxin Gastro esophageal reflux disease Prevacid refilled osteoporosis stable  Oncology followup as planned CPX 6 months

## 2012-05-07 NOTE — Patient Instructions (Signed)
Limit your sodium (Salt) intake  Please check your blood pressure on a regular basis.  If it is consistently greater than 150/90, please make an office appointment.  Return in 6 months for follow-up   

## 2012-05-14 ENCOUNTER — Other Ambulatory Visit (HOSPITAL_BASED_OUTPATIENT_CLINIC_OR_DEPARTMENT_OTHER): Payer: Medicare Other

## 2012-05-14 ENCOUNTER — Telehealth: Payer: Self-pay | Admitting: Oncology

## 2012-05-14 ENCOUNTER — Ambulatory Visit (HOSPITAL_BASED_OUTPATIENT_CLINIC_OR_DEPARTMENT_OTHER): Payer: Medicare Other | Admitting: Family

## 2012-05-14 ENCOUNTER — Encounter: Payer: Self-pay | Admitting: Family

## 2012-05-14 ENCOUNTER — Ambulatory Visit: Payer: Medicare Other | Admitting: Oncology

## 2012-05-14 VITALS — BP 162/66 | HR 56 | Temp 97.8°F | Resp 20 | Ht 63.0 in | Wt 153.2 lb

## 2012-05-14 DIAGNOSIS — Z853 Personal history of malignant neoplasm of breast: Secondary | ICD-10-CM

## 2012-05-14 DIAGNOSIS — M7989 Other specified soft tissue disorders: Secondary | ICD-10-CM

## 2012-05-14 DIAGNOSIS — M810 Age-related osteoporosis without current pathological fracture: Secondary | ICD-10-CM

## 2012-05-14 DIAGNOSIS — R030 Elevated blood-pressure reading, without diagnosis of hypertension: Secondary | ICD-10-CM

## 2012-05-14 DIAGNOSIS — M899 Disorder of bone, unspecified: Secondary | ICD-10-CM

## 2012-05-14 LAB — CBC WITH DIFFERENTIAL/PLATELET
BASO%: 1 % (ref 0.0–2.0)
EOS%: 5.6 % (ref 0.0–7.0)
MCHC: 32.5 g/dL (ref 31.5–36.0)
MONO#: 0.4 10*3/uL (ref 0.1–0.9)
RBC: 3.56 10*6/uL — ABNORMAL LOW (ref 3.70–5.45)
WBC: 6.2 10*3/uL (ref 3.9–10.3)
lymph#: 1.9 10*3/uL (ref 0.9–3.3)

## 2012-05-14 LAB — COMPREHENSIVE METABOLIC PANEL (CC13)
Albumin: 3.7 g/dL (ref 3.5–5.0)
CO2: 22 mEq/L (ref 22–29)
Calcium: 8.6 mg/dL (ref 8.4–10.4)
Chloride: 104 mEq/L (ref 98–107)
Glucose: 91 mg/dl (ref 70–99)
Potassium: 5.5 mEq/L — ABNORMAL HIGH (ref 3.5–5.1)
Sodium: 133 mEq/L — ABNORMAL LOW (ref 136–145)
Total Bilirubin: 0.3 mg/dL (ref 0.20–1.20)
Total Protein: 6.5 g/dL (ref 6.4–8.3)

## 2012-05-14 LAB — LACTATE DEHYDROGENASE (CC13): LDH: 204 U/L (ref 125–220)

## 2012-05-14 NOTE — Progress Notes (Signed)
Patient ID: Mary Guzman, female   DOB: Mar 04, 1921, 78 y.o.   MRN: [id number] CSN: [id number]  Cc: Mary Lor, MD Mary Edison, MD  Problem List: [name] is a 78 y.o. female with a problem list consisting of: 1.  Stage IIB, Invasive carcinoma of the left breast, moderately differentiated, with 3 out of 8 positive lymph nodes, ER+, PR negative (1994) 2.  Left breast modified radical mastectomy (1994) 3.  Right ankle fracture and repair 4.  HTN 5.  Osteoporosis 6.  GERD 7.  Hypothyroidism 8.  Cataracts and cataract surgery  Ms. Mary Guzman returns to our clinic today for follow-up of her history of stage IIB invasive carcinoma of the left breast. Since her last visit in June 2012, she is without major complaint and does not describe any difficulty completing her ADLs. The patient is accompanied by her daughter Mary Guzman at today's visit. The patient does mention that she has ongoing bilateral lower extremity swelling,  but she is able to ambulate without difficulty. The patient denies any fever, chills, pain, night sweats, cough, dyspnea, nausea, vomiting, diarrhea or sick contact.  The patient also denies any unusual bruising or bleeding. The patient states that October is the month in which she receives her annual mammograms.  The patient cannot remember the last time she had a colonoscopy.  The patient receives the influenza vaccination annually.  Ms. Tuck believes she received the pneumonia vaccine 3 years ago. The patient reports she had a bone density examination in April 09, 2011.  The patient's blood pressure was slightly elevated today, this is an isolated reading. The patient and her daughter deny any other symptomatology or concerns.  Past Medical History: Past Medical History  Diagnosis Date  . GERD (gastroesophageal reflux disease)   . Thyroid disease     Hypothyroidism  . Osteoporosis   . DJD (degenerative joint disease)   . Cancer     Breast Hx of stage IIB,  moderately differentiated with 3 of 8 possible lymph nodes.  . Hypertension     Surgical History: Past Surgical History  Procedure Date  . Mastectomy 1994  . Ankle surgery     Right  . Intracapsular cataract extraction     Current Medications: Current Outpatient Prescriptions  Medication Sig Dispense Refill  . aspirin 81 MG EC tablet Take 81 mg by mouth daily.        . Calcium Carbonate (CALCIUM 500 PO) Take 1 tablet by mouth daily.        . lansoprazole (PREVACID) 30 MG capsule Take 1 capsule (30 mg total) by mouth daily.  90 capsule  3  . levothyroxine (SYNTHROID, LEVOTHROID) 75 MCG tablet Take 1 tablet (75 mcg total) by mouth daily.  90 tablet  3  . lisinopril-hydrochlorothiazide (PRINZIDE,ZESTORETIC) 20-25 MG per tablet Take 1 tablet by mouth daily.  90 tablet  3  . Multiple Vitamins-Minerals ([place]) Take by mouth 4 (four) times daily. Macular Protect Complete      . raloxifene (EVISTA) 60 MG tablet Take 1 tablet (60 mg total) by mouth daily.  90 tablet  3    Allergies: No Known Allergies   Family History: Family History  Problem Relation Age of Onset  . Alcohol abuse Brother   . Asthma Son     Social History: History  Substance Use Topics  . Smoking status: Never Smoker   . Smokeless tobacco: Current User    Types: Chew  . Alcohol  Use: No    Review of Systems: 10 Point review of systems was completed and is negative except as noted above.   Physical Exam:   Blood pressure 162/66, pulse 56, temperature 97.8 F (36.6 C), temperature source Oral, resp. rate 20, height 5\' 3"  (1.6 m), weight 153 lb 3.2 oz (69.491 kg).  General appearance: Alert, cooperative, well nourished, no apparent distress Head: Normocephalic, without obvious abnormality, atraumatic Eyes: Conjunctivae/corneas clear, PERRLA, EOMI, no scleral icterus Nose: Nares, septum and mucosa are normal, no drainage or sinus tenderness Neck: No adenopathy, supple, symmetrical, trachea  midline, thyroid not enlarged, no tenderness Resp: Clear to auscultation bilaterally Breasts: Left total mastectomy with well-healed surgical scar,right breast has a normal appearance, no masses or tenderness Cardio: Regular rate and rhythm, S1, S2 normal, no murmur, click, rub or gallop GI: Soft, non-tender, slightly distended, normoactive bowel sounds,  no masses,  no organomegaly Extremities: Extremities RLE is larger than LLE, 2+ bilateral LE edema,  atraumatic, no cyanosis  Lymph nodes: Cervical, supraclavicular, and axillary nodes normal Neurologic: Grossly normal   Laboratory Data: Results for orders placed in visit on 05/14/12 (from the past 48 hour(s))  CBC WITH DIFFERENTIAL     Status: Abnormal   Collection Time   05/14/12  3:22 PM      Component Value Range Comment   WBC 6.2  3.9 - 10.3 10e3/uL    NEUT# 3.5  1.5 - 6.5 10e3/uL    HGB 10.2 (*) 11.6 - 15.9 g/dL    HCT 31.4 (*) 34.8 - 46.6 %    Platelets 292  145 - 400 10e3/uL    MCV 88.2  79.5 - 101.0 fL    MCH 28.7  25.1 - 34.0 pg    MCHC 32.5  31.5 - 36.0 g/dL    RBC 3.56 (*) 3.70 - 5.45 10e6/uL    RDW 14.4  11.2 - 14.5 %    lymph# 1.9  0.9 - 3.3 10e3/uL    MONO# 0.4  0.1 - 0.9 10e3/uL    Eosinophils Absolute 0.4  0.0 - 0.5 10e3/uL    Basophils Absolute 0.1  0.0 - 0.1 10e3/uL    NEUT% 56.4  38.4 - 76.8 %    LYMPH% 29.9  14.0 - 49.7 %    MONO% 7.1  0.0 - 14.0 %    EOS% 5.6  0.0 - 7.0 %    BASO% 1.0  0.0 - 2.0 %      Imaging Studies: 1.  04/09/2011 Bone Density Study T Score:  2.7 - the patient's diagnostic category is osteoporosis by WHO criteria and fracture risk is increased.  Comparison: 04/22/2006, 04/09/2004, 04/02/2001. Compared to the previous study at the lumbar spine, there has been 13.9% decrease in bone mineral density over the baseline exam and 8.2% loss of bone mineral density since the previous study. Both numbers are statistically significant. No comparison is available for the forearm. 2.  07/02/2011  Digital Screening Mammogram Unilateral Right -  IMPRESSION: No specific mammographic evidence of malignancy. Next screening mammogram is recommended in one year.   Impression/Plan: Ms. Mary Guzman is a 78 year-old Caucasian female with a history of stage IIB invasive carcinoma of the left breast, moderately differentiated, with 3/8 positive lymph nodes diagnosed in August 1994. She underwent left modified radical mastectomy on 05/09/1993, followed by adjuvant tamoxifen for 5 years. She also received external radiation therapy to the chest. She's had no evidence of disease recurrence since that time. Her last mammogram was  obtained on 07/02/2011 which revealed no mammographic evidence of malignancy.  She'll be due for repeat mammogram in October of 2013. The patient received a bone density/DEXA scan 04/09/2011 in which her T score was -2.7 which indicates osteoporosis. Dr. Ralene Ok gave the patient the option of following up with our office on an as needed basis or to continue the annual evaluations during her office visit today.  The patient opted to continue annual evaluations.  Dr. Ralene Ok will see the patient again for follow up with in one years' time.  At that time we will check CBC, CMP and LDH. The patient's annual mammogram has been scheduled for July 08, 2012. The patient and her daughter were asked to call in the interim if they have any questions or concerns.   Marvis Bakken NP-C 05/14/2012, 3:31 PM

## 2012-05-14 NOTE — Patient Instructions (Addendum)
Patient ID: Mary Guzman,   DOB: [birth year],  MRN: [id number]   [place] Discharge Instructions  RECOMMENDATIONS MAD BY THE CONSULTANT AND ANY TEST RESULT(S) WILL BE FORWARDED TO YOU REFERRING DOCTOR   EXAM FINDINGS BY NURSE PRACTITIONER TODAY TO REPORT TO THE CLINIC OR PRIMARY PROVIDER:    Your Current Medications Are: Current Outpatient Prescriptions  Medication Sig Dispense Refill  . aspirin 81 MG EC tablet Take 81 mg by mouth daily.        . Calcium Carbonate (CALCIUM 500 PO) Take 1 tablet by mouth daily.        . lansoprazole (PREVACID) 30 MG capsule Take 1 capsule (30 mg total) by mouth daily.  90 capsule  3  . levothyroxine (SYNTHROID, LEVOTHROID) 75 MCG tablet Take 1 tablet (75 mcg total) by mouth daily.  90 tablet  3  . lisinopril-hydrochlorothiazide (PRINZIDE,ZESTORETIC) 20-25 MG per tablet Take 1 tablet by mouth daily.  90 tablet  3  . Multiple Vitamins-Minerals ([place]) Take by mouth 4 (four) times daily. Macular Protect Complete      . raloxifene (EVISTA) 60 MG tablet Take 1 tablet (60 mg total) by mouth daily.  90 tablet  3     INSTRUCTIONS GIVEN, DISCUSSED AND FOLLOW-UP: Keep up the great work. Think about giving up the chewing tobacco.  I acknowledge that I have been informed and understand all the instructions given to me and have received a copy.  I do not have any further questions at this time, but I understand that I may call the [facility] at [phone] during business hours should I have any further questions or need assistance in obtaining follow-up care.   05/14/2012, 4:37 PM

## 2012-05-14 NOTE — Telephone Encounter (Signed)
gv pt appt schedule for August 2014 and mammo for 07/08/12.

## 2012-06-16 ENCOUNTER — Ambulatory Visit (INDEPENDENT_AMBULATORY_CARE_PROVIDER_SITE_OTHER): Payer: Medicare Other

## 2012-06-16 DIAGNOSIS — Z23 Encounter for immunization: Secondary | ICD-10-CM

## 2012-06-30 ENCOUNTER — Ambulatory Visit: Payer: Medicare Other

## 2012-07-08 ENCOUNTER — Ambulatory Visit
Admission: RE | Admit: 2012-07-08 | Discharge: 2012-07-08 | Disposition: A | Discharge status: Home / Self Care | Payer: Medicare Other | Source: Ambulatory Visit | Attending: Family | Admitting: Family

## 2012-07-08 DIAGNOSIS — Z853 Personal history of malignant neoplasm of breast: Secondary | ICD-10-CM

## 2012-10-29 ENCOUNTER — Other Ambulatory Visit: Payer: Medicare Other

## 2012-11-05 ENCOUNTER — Encounter: Payer: Self-pay | Admitting: Internal Medicine

## 2012-11-05 ENCOUNTER — Ambulatory Visit (INDEPENDENT_AMBULATORY_CARE_PROVIDER_SITE_OTHER): Payer: Medicare Other | Admitting: Internal Medicine

## 2012-11-05 VITALS — BP 110/70 | HR 65 | Temp 98.4°F | Resp 18 | Ht 62.5 in | Wt 146.0 lb

## 2012-11-05 DIAGNOSIS — M810 Age-related osteoporosis without current pathological fracture: Secondary | ICD-10-CM

## 2012-11-05 DIAGNOSIS — I10 Essential (primary) hypertension: Secondary | ICD-10-CM

## 2012-11-05 DIAGNOSIS — K219 Gastro-esophageal reflux disease without esophagitis: Secondary | ICD-10-CM

## 2012-11-05 DIAGNOSIS — Z853 Personal history of malignant neoplasm of breast: Secondary | ICD-10-CM

## 2012-11-05 DIAGNOSIS — Z0000 Encounter for general adult medical examination without abnormal findings: Secondary | ICD-10-CM

## 2012-11-05 DIAGNOSIS — M1990 Unspecified osteoarthritis, unspecified site: Secondary | ICD-10-CM

## 2012-11-05 DIAGNOSIS — E039 Hypothyroidism, unspecified: Secondary | ICD-10-CM

## 2012-11-05 LAB — COMPREHENSIVE METABOLIC PANEL
ALT: 20 U/L (ref 0–35)
AST: 27 U/L (ref 0–37)
Albumin: 3.7 g/dL (ref 3.5–5.2)
BUN: 42 mg/dL — ABNORMAL HIGH (ref 6–23)
CO2: 22 mEq/L (ref 19–32)
Calcium: 8.8 mg/dL (ref 8.4–10.5)
Chloride: 112 mEq/L (ref 96–112)
GFR: 27.24 mL/min — ABNORMAL LOW (ref >60.00)
Potassium: 5.2 mEq/L — ABNORMAL HIGH (ref 3.5–5.1)

## 2012-11-05 LAB — CBC WITH DIFFERENTIAL/PLATELET
Basophils Absolute: 0.1 10*3/uL (ref 0.0–0.1)
Eosinophils Absolute: 0.3 10*3/uL (ref 0.0–0.7)
Lymphocytes Relative: 21.9 % (ref 12.0–46.0)
MCHC: 33 g/dL (ref 30.0–36.0)
Neutrophils Relative %: 66.2 % (ref 43.0–77.0)
RBC: 3.61 Mil/uL — ABNORMAL LOW (ref 3.87–5.11)
RDW: 14.7 % — ABNORMAL HIGH (ref 11.5–14.6)

## 2012-11-05 MED ORDER — LANSOPRAZOLE 30 MG PO CPDR: 30.0000 mg | DELAYED_RELEASE_CAPSULE | Freq: Every day | ORAL | Status: DC

## 2012-11-05 MED ORDER — LEVOTHYROXINE SODIUM 75 MCG PO TABS: 75.0000 ug | ORAL_TABLET | Freq: Every day | ORAL | Status: DC

## 2012-11-05 MED ORDER — LISINOPRIL-HYDROCHLOROTHIAZIDE 20-25 MG PO TABS: 1.0000 | ORAL_TABLET | Freq: Every day | ORAL | Status: DC

## 2012-11-05 NOTE — Patient Instructions (Signed)
Limit your sodium (Salt) intake  Please check your blood pressure on a regular basis.  If it is consistently greater than 150/90, please make an office appointment.   

## 2012-11-05 NOTE — Progress Notes (Signed)
Patient ID: Mary Guzman, female   DOB: 1921/03/18, 79 y.o.   MRN: [id number]  Subjective:    Patient ID: Mary Guzman, female    DOB: Feb 26, 1921, 79 y.o.   MRN: [id number]  HPI  79 year-old patient who is seen today for her annual followup. She has a history of treated hypothyroidism as well as treated hypertension. She has done quite well. She has a history of  breast cancer and osteoporosis. She is on calcium and vitamin D supplements. She has a history of gastroesophageal reflux disease which has been stable. She has done well without any new concerns or complaints.   Past Medical History  Diagnosis Date  . GERD (gastroesophageal reflux disease)   . Thyroid disease     Hypothyroidism  . Osteoporosis   . DJD (degenerative joint disease)   . 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  . Hypertension     History   Social History  . Marital Status: Single    Spouse Name: N/A    Number of Children: N/A  . Years of Education: N/A   Occupational History  . Not on file.   Social History Main Topics  . Smoking status: Never Smoker   . Smokeless tobacco: Current User    Types: Chew  . Alcohol Use: No  . Drug Use: Not on file  . Sexually Active: Not on file   Other Topics Concern  . Not on file   Social History Narrative   Fairly active interaction with grandchildren.    Past Surgical History  Procedure Laterality Date  . Mastectomy  1994  . Ankle surgery      Right  . Intracapsular cataract extraction      Family History  Problem Relation Age of Onset  . Alcohol abuse Brother   . Asthma Son     No Known Allergies  Current Outpatient Prescriptions on File Prior to Visit  Medication Sig Dispense Refill  . aspirin 81 MG EC tablet Take 81 mg by mouth daily.        . Calcium Carbonate (CALCIUM 500 PO) Take 1 tablet by mouth daily.        . lansoprazole (PREVACID) 30 MG capsule Take 1 capsule (30 mg total) by  mouth daily.  90 capsule  3  . levothyroxine (SYNTHROID, LEVOTHROID) 75 MCG tablet Take 1 tablet (75 mcg total) by mouth daily.  90 tablet  3  . lisinopril-hydrochlorothiazide (PRINZIDE,ZESTORETIC) 20-25 MG per tablet Take 1 tablet by mouth daily.  90 tablet  3  . Multiple Vitamins-Minerals ([place]) Take by mouth 4 (four) times daily. Macular Protect Complete      . raloxifene (EVISTA) 60 MG tablet Take 1 tablet (60 mg total) by mouth daily.  90 tablet  3   No current facility-administered medications on file prior to visit.    BP 110/70  Pulse 65  Temp(Src) 98.4 F (36.9 C) (Oral)  Resp 18  Ht 5' 2.5" (1.588 m)  Wt 146 lb (66.225 kg)  BMI 26.26 kg/m2  SpO2 96%   1. Risk factors, based on past  M,S,F history- cardio vascular factors include hypertension and age  79.  Physical activities: Fairly sedentary but does well for 79 years of age  41.  Depression/mood: No history of depression or mood disorder  4.  Hearing: Mild deficit only  5.  ADL's: Requires assistance with aspects  of daily living  6.  Fall risk: Moderate due to age and slight instability  7.  Home safety: No proms identifying  8.  Height weight, and visual acuity; height and weight stable no change in visual acuity  9.  Counseling: Heart healthy diet encouraged  10. Lab orders based on risk factors: TSH and laboratory update reviewed  11. Referral : Not appropriate at this time  12. Care plan: Continue the aggressive blood pressure control  13. Cognitive assessment: Alert and oriented normal affect. No cognitive dysfunction      Review of Systems  Constitutional: Negative.   HENT: Negative for hearing loss, congestion, sore throat, rhinorrhea, dental problem, sinus pressure and tinnitus.   Eyes: Negative for pain, discharge and visual disturbance.  Respiratory: Negative for cough and shortness of breath.   Cardiovascular: Negative for chest pain, palpitations and leg swelling.   Gastrointestinal: Negative for nausea, vomiting, abdominal pain, diarrhea, constipation, blood in stool and abdominal distention.  Genitourinary: Negative for dysuria, urgency, frequency, hematuria, flank pain, vaginal bleeding, vaginal discharge, difficulty urinating, vaginal pain and pelvic pain.  Musculoskeletal: Positive for arthralgias. Negative for joint swelling and gait problem.       Right ankle pain  Skin: Negative for rash.  Neurological: Negative for dizziness, syncope, speech difficulty, weakness, numbness and headaches.  Hematological: Negative for adenopathy.  Psychiatric/Behavioral: Negative for behavioral problems, dysphoric mood and agitation. The patient is not nervous/anxious.        Objective:   Physical Exam  Constitutional: She is oriented to person, place, and time. She appears well-developed and well-nourished.  HENT:  Head: Normocephalic.  Right Ear: External ear normal.  Left Ear: External ear normal.  Mouth/Throat: Oropharynx is clear and moist.  Eyes: Conjunctivae and EOM are normal. Pupils are equal, round, and reactive to light.  Neck: Normal range of motion. Neck supple. No thyromegaly present.  Cardiovascular: Normal rate, regular rhythm and normal heart sounds.   Pedal pulses not easily palpable  Pulmonary/Chest: Effort normal and breath sounds normal.  Abdominal: Soft. Bowel sounds are normal. She exhibits no mass. There is no tenderness.  Genitourinary: Guaiac negative stool.  Musculoskeletal: Normal range of motion.  Osteoarthritic changes in the small joints  Trace ankle edema  Lymphadenopathy:    She has no cervical adenopathy.  Neurological: She is alert and oriented to person, place, and time.  Skin: Skin is warm and dry. No rash noted.  Psychiatric: She has a normal mood and affect. Her behavior is normal.          Assessment & Plan:   Osteoporosis. We'll continue  calcium and vitamin D supplementation Hypertension. Well  controlled we'll continue her present antihypertensive regimen. Hypothyroidism. We'll check a TSH. Gastroesophageal reflux disease stable on present regimen  We'll recheck in 12 months

## 2013-01-01 ENCOUNTER — Telehealth: Payer: Self-pay | Admitting: *Deleted

## 2013-01-01 NOTE — Telephone Encounter (Signed)
sw pt informed her that DSM will be on pal 8/26. gv appt d/t for 05/17/13. Pt is aware.Mary Kitchentd

## 2013-03-04 ENCOUNTER — Telehealth: Payer: Self-pay | Admitting: *Deleted

## 2013-03-04 NOTE — Telephone Encounter (Signed)
sw  the pt informed her that we are closed on 05/17/13. gv appt d/t for 05/18/13@ 1pm labs, and ov@ 1:30pm. i also made the pt aware that i will mail a letter/cal as well...td

## 2013-05-04 ENCOUNTER — Telehealth: Payer: Self-pay | Admitting: Hematology and Oncology

## 2013-05-04 NOTE — Telephone Encounter (Signed)
Per email from [facility] called pt to r/s 9/2 appt to wk of 9/15. S/w pt re new d/t for 9/17 @ 2:30pm lb/fu. Per Merry Proud pt to see Alvy Bimler and scheduled under CP2.

## 2013-05-11 ENCOUNTER — Other Ambulatory Visit: Payer: Medicare Other | Admitting: Lab

## 2013-05-11 ENCOUNTER — Ambulatory Visit: Payer: Medicare Other | Admitting: Oncology

## 2013-05-17 ENCOUNTER — Other Ambulatory Visit: Payer: Medicare Other | Admitting: Lab

## 2013-05-17 ENCOUNTER — Ambulatory Visit: Payer: Medicare Other | Admitting: Oncology

## 2013-05-18 ENCOUNTER — Other Ambulatory Visit: Payer: Self-pay | Admitting: Lab

## 2013-05-18 ENCOUNTER — Ambulatory Visit: Payer: Medicare Other

## 2013-05-21 ENCOUNTER — Telehealth: Payer: Self-pay | Admitting: Hematology and Oncology

## 2013-05-21 NOTE — Telephone Encounter (Signed)
MOVED 9/17 APPT TO [facility]. S/W PT RE NEW TIME FOR LB/FU 9/17 @ 1:30PM.

## 2013-06-02 ENCOUNTER — Telehealth: Payer: Self-pay | Admitting: Internal Medicine

## 2013-06-02 ENCOUNTER — Other Ambulatory Visit: Payer: Self-pay | Admitting: *Deleted

## 2013-06-02 ENCOUNTER — Ambulatory Visit (HOSPITAL_BASED_OUTPATIENT_CLINIC_OR_DEPARTMENT_OTHER): Payer: Medicare Other | Admitting: Hematology and Oncology

## 2013-06-02 ENCOUNTER — Other Ambulatory Visit (HOSPITAL_BASED_OUTPATIENT_CLINIC_OR_DEPARTMENT_OTHER): Payer: Medicare Other | Admitting: Lab

## 2013-06-02 ENCOUNTER — Telehealth: Payer: Self-pay | Admitting: Hematology and Oncology

## 2013-06-02 ENCOUNTER — Encounter: Payer: Self-pay | Admitting: Hematology and Oncology

## 2013-06-02 VITALS — BP 143/62 | HR 67 | Temp 97.5°F | Resp 20 | Ht 62.5 in | Wt 145.8 lb

## 2013-06-02 DIAGNOSIS — C50919 Malignant neoplasm of unspecified site of unspecified female breast: Secondary | ICD-10-CM

## 2013-06-02 DIAGNOSIS — Z853 Personal history of malignant neoplasm of breast: Secondary | ICD-10-CM

## 2013-06-02 DIAGNOSIS — I129 Hypertensive chronic kidney disease with stage 1 through stage 4 chronic kidney disease, or unspecified chronic kidney disease: Secondary | ICD-10-CM

## 2013-06-02 DIAGNOSIS — D638 Anemia in other chronic diseases classified elsewhere: Secondary | ICD-10-CM

## 2013-06-02 DIAGNOSIS — N189 Chronic kidney disease, unspecified: Secondary | ICD-10-CM

## 2013-06-02 DIAGNOSIS — M810 Age-related osteoporosis without current pathological fracture: Secondary | ICD-10-CM

## 2013-06-02 DIAGNOSIS — Z23 Encounter for immunization: Secondary | ICD-10-CM

## 2013-06-02 DIAGNOSIS — D631 Anemia in chronic kidney disease: Secondary | ICD-10-CM

## 2013-06-02 LAB — COMPREHENSIVE METABOLIC PANEL (CC13)
AST: 26 U/L (ref 5–34)
Albumin: 3.6 g/dL (ref 3.5–5.0)
BUN: 50.1 mg/dL — ABNORMAL HIGH (ref 7.0–26.0)
Calcium: 8.8 mg/dL (ref 8.4–10.4)
Chloride: 113 mEq/L — ABNORMAL HIGH (ref 98–109)
Glucose: 97 mg/dl (ref 70–140)
Potassium: 5.8 mEq/L — ABNORMAL HIGH (ref 3.5–5.1)

## 2013-06-02 LAB — CBC WITH DIFFERENTIAL/PLATELET
Basophils Absolute: 0.1 10*3/uL (ref 0.0–0.1)
Eosinophils Absolute: 0.2 10*3/uL (ref 0.0–0.5)
HGB: 10.7 g/dL — ABNORMAL LOW (ref 11.6–15.9)
MONO#: 0.5 10*3/uL (ref 0.1–0.9)
NEUT#: 5.3 10*3/uL (ref 1.5–6.5)
RDW: 13.8 % (ref 11.2–14.5)
lymph#: 1.1 10*3/uL (ref 0.9–3.3)

## 2013-06-02 MED ORDER — INFLUENZA VAC SPLIT QUAD 0.5 ML IM SUSP
0.5000 mL | Freq: Once | INTRAMUSCULAR | Status: AC
  Administered 2013-06-02: 0.5 mL via INTRAMUSCULAR
  Filled 2013-06-02: qty 0.5

## 2013-06-02 NOTE — Telephone Encounter (Signed)
Dr. Alvy Bimler instructed patient not to take her blood pressure medication because her labs showed that her liver was elevated, as well as her potasium. Please advise.

## 2013-06-02 NOTE — Progress Notes (Signed)
[name] OFFICE PROGRESS NOTE  [name], MD [place] [address]  DIAGNOSIS: Needs flu shot - Plan: influenza vac split quadrivalent PF (FLUARIX) injection 0.5 mL  OSTEOPOROSIS - Plan: DG Bone Density  BREAST CANCER, HX OF - Plan: MM Digital Screening Unilat R, MM Digital Screening Unilat R  SUMMARY OF ONCOLOGIC HISTORY: I have reviewed her chart extensively. She has a background history of left breast cancer, moderately differentiated, sized unknown but with involvement of 3/8 lymph nodes, ER positive, PR negative, HER-2/neu unknown. The patient underwent left breast modified radical mastectomy in 1994, followed by radiation therapy. She was placed on tamoxifen for 5 years. Most recently she is on Evista. INTERVAL HISTORY: Mary Guzman 79 y.o. female returns for yearly visit pertaining to her prior diagnosis of breast cancer. She has no new complaints. She denies any new areas of abnormalities in her chest wall. No new palpable lumps of bumps swelling of lymph glands. She denies any new areas of bone pain. She's been compliant taking Evista. She is taking calcium and vitamin D for her history of osteoporosis. Her last bone density scan was more than 2 years ago.   I have reviewed the past medical history, past surgical history, social history and family history with the patient and they are unchanged from previous note.  ALLERGIES:  has No Known Allergies.  MEDICATIONS: has a current medication list which includes the following prescription(s): aspirin, calcium-magnesium-vitamin d, lansoprazole, levothyroxine, lisinopril-hydrochlorothiazide, multiple vitamins-minerals, and raloxifene.  REVIEW OF SYSTEMS:   Constitutional: Denies fevers, chills or abnormal weight loss Eyes: Denies blurriness of vision Ears, nose, mouth, throat, and face: Denies mucositis or sore throat Respiratory: Denies cough, dyspnea or wheezes Cardiovascular:  Denies palpitation, chest discomfort or lower extremity swelling Gastrointestinal:  Denies nausea, heartburn or change in bowel habits Skin: Denies abnormal skin rashes Lymphatics: Denies new lymphadenopathy or easy bruising Neurological:Denies numbness, tingling or new weaknesses Behavioral/Psych: Mood is stable, no new changes  All other systems were reviewed with the patient and are negative.  PHYSICAL EXAMINATION: ECOG PERFORMANCE STATUS: 1 - Symptomatic but completely ambulatory  Filed Vitals:   06/02/13 1359  BP: 143/62  Pulse: 67  Temp: 97.5 F (36.4 C)  Resp: 20    GENERAL:alert, no distress and comfortable SKIN: skin color, texture, turgor are normal, no rashes or significant lesions EYES: normal, Conjunctiva are pink and non-injected, sclera clear OROPHARYNX:no exudate, no erythema and lips, buccal mucosa, and tongue normal  NECK: supple, thyroid normal size, non-tender, without nodularity LYMPH:  no palpable lymphadenopathy in the cervical, axillary or inguinal LUNGS: clear to auscultation and percussion with normal breathing effort HEART: regular rate & rhythm and no murmurs and no lower extremity edema ABDOMEN:abdomen soft, non-tender and normal bowel sounds Musculoskeletal:no cyanosis of digits and no clubbing  NEURO: alert & oriented x 3 with fluent speech, no focal motor/sensory deficits Examining her right breast which is normal. There were no new lumps or bumps. The nipple is normal. On the left chest, there is a well-healed mastectomy scar no palpable abnormalities. I have examined both axilla and they were normal.   LABORATORY DATA:  I have reviewed the data as listed Results for orders placed in visit on 06/02/13 (from the past 48 hour(s))  CBC WITH DIFFERENTIAL     Status: Abnormal   Collection Time    06/02/13  1:47 PM      Result Value Range   WBC 7.2  3.9 -  10.3 10e3/uL   NEUT# 5.3  1.5 - 6.5 10e3/uL   HGB 10.7 (*) 11.6 - 15.9 g/dL   HCT 31.8 (*)  34.8 - 46.6 %   Platelets 304  145 - 400 10e3/uL   MCV 90.9  79.5 - 101.0 fL   MCH 30.5  25.1 - 34.0 pg   MCHC 33.6  31.5 - 36.0 g/dL   RBC 3.50 (*) 3.70 - 5.45 10e6/uL   RDW 13.8  11.2 - 14.5 %   lymph# 1.1  0.9 - 3.3 10e3/uL   MONO# 0.5  0.1 - 0.9 10e3/uL   Eosinophils Absolute 0.2  0.0 - 0.5 10e3/uL   Basophils Absolute 0.1  0.0 - 0.1 10e3/uL   NEUT% 74.3  38.4 - 76.8 %   LYMPH% 15.9  14.0 - 49.7 %   MONO% 6.4  0.0 - 14.0 %   EOS% 2.3  0.0 - 7.0 %   BASO% 1.1  0.0 - 2.0 %  COMPREHENSIVE METABOLIC PANEL (0000000)     Status: Abnormal   Collection Time    06/02/13  1:47 PM      Result Value Range   Sodium 140  136 - 145 mEq/L   Potassium 5.8 (*) 3.5 - 5.1 mEq/L   Chloride 113 (*) 98 - 109 mEq/L   CO2 20 (*) 22 - 29 mEq/L   Glucose 97  70 - 140 mg/dl   BUN 50.1 (*) 7.0 - 26.0 mg/dL   Creatinine 2.3 (*) 0.6 - 1.1 mg/dL   Total Bilirubin 0.23  0.20 - 1.20 mg/dL   Alkaline Phosphatase 54  40 - 150 U/L   AST 26  5 - 34 U/L   ALT 14  0 - 55 U/L   Total Protein 6.9  6.4 - 8.3 g/dL   Albumin 3.6  3.5 - 5.0 g/dL   Calcium 8.8  8.4 - 10.4 mg/dL    RADIOGRAPHIC STUDIES: I have personally reviewed the radiological images as listed and agreed with the findings in the report. I have reviewed the last mammogram report and her last bone density report.  ASSESSMENT: Remote history of left breast cancer, no evidence of disease recurrence.   PLAN:  #1 history of breast cancer She is doing very well. She is currently on Evista which has been shown to reduce her risk of breast cancer recurrence. I recommend you continue history, physical examination, and yearly mammogram. Her next mammogram is due in October of this year. I have ordered that for her. #2 history of osteoporosis She will continue Evista, calcium and vitamin D supplements. We will recheck a bone density scan in October. #3 high blood pressure Her blood pressure is just mildly elevated. I have asked her to stop her blood  pressure medication today 2 to worsening kidney function and mild hyperkalemia. #4 hyperkalemia I have asked her to hold her blood pressure medication and make an appointment to see her primary care provider as soon as possible for further evaluation. #5 anemia of chronic disease This is stable compared to her osteopenia but this is likely due to her chronic kidney disease. We'll observe. #6 chronic kidney disease Her creatinine is slightly worse. I suspect this could be due to her blood pressure medication. I have stopped her blood pressure medication today. She will need to followup with her primary care provider but later than next week. #7 preventive care We will administer influenza vaccination in the clinic today.  All questions were answered. The patient knows  to call the clinic with any problems, questions or concerns. We can certainly see the patient much sooner if necessary. No barriers to learning was detected.  The patient and plan discussed with [facility], Teal Bontrager  and she is in agreement with the aforementioned.  I spent 25 minutes counseling the patient face to face. The total time spent in the appointment was 40 minutes and more than 50% was on counseling.     [facility], [place], MD 06/02/2013 2:31 PM

## 2013-06-02 NOTE — Telephone Encounter (Signed)
gv pt appt schedule for November 2015 including appts for mammo/dexa scan 07/13/13 @ [facility]. Per pt dtr appt for NG November 2015 was scheduled for 2nd wk in November due to [facility] for late October 2015 is still to be schedule. Only one appt can be scheduled at a time and since pt has not completed mammo for 2014 yet appt for 2015 cannot be scheduled. Once mammo for 2014 is complete/resulted pt/dtr aware that they can schedule appt for 2015 and that [facility] will also send out a reminder. Pt/dtr aware mammo just needs to be before f/u visit w/NG 2nd wk in November.

## 2013-06-03 MED ORDER — RALOXIFENE HCL 60 MG PO TABS: 60.0000 mg | ORAL_TABLET | Freq: Every day | ORAL | Status: DC

## 2013-06-03 NOTE — Telephone Encounter (Signed)
agree. Discontinue blood pressure medicine and make office visit in one or 2 weeks. Will repeat laboratory studies at that time. Ask  patient to liberalize her fluid intake

## 2013-06-03 NOTE — Telephone Encounter (Signed)
Spoke to pt told her Dr. Raliegh Ip agrees with Cardiologist, discontinue blood pressure medicine and make follow up appt in 1-2 weeks. Will repeat labs at that time and need to increase fluids. Pt verbalized understanding.

## 2013-06-07 ENCOUNTER — Ambulatory Visit (INDEPENDENT_AMBULATORY_CARE_PROVIDER_SITE_OTHER): Payer: Medicare Other | Admitting: Internal Medicine

## 2013-06-07 ENCOUNTER — Encounter: Payer: Self-pay | Admitting: Internal Medicine

## 2013-06-07 VITALS — BP 140/80 | HR 70 | Temp 98.4°F | Resp 20 | Wt 148.0 lb

## 2013-06-07 DIAGNOSIS — E039 Hypothyroidism, unspecified: Secondary | ICD-10-CM

## 2013-06-07 DIAGNOSIS — M1990 Unspecified osteoarthritis, unspecified site: Secondary | ICD-10-CM

## 2013-06-07 DIAGNOSIS — N189 Chronic kidney disease, unspecified: Secondary | ICD-10-CM

## 2013-06-07 DIAGNOSIS — I10 Essential (primary) hypertension: Secondary | ICD-10-CM

## 2013-06-07 DIAGNOSIS — Z23 Encounter for immunization: Secondary | ICD-10-CM

## 2013-06-07 LAB — BASIC METABOLIC PANEL
Calcium: 8.4 mg/dL (ref 8.4–10.5)
GFR: 22.27 mL/min — ABNORMAL LOW (ref >60.00)
Glucose, Bld: 90 mg/dL (ref 70–99)
Sodium: 136 mEq/L (ref 135–145)

## 2013-06-07 LAB — TSH: TSH: 7.21 u[IU]/mL — ABNORMAL HIGH (ref 0.35–5.50)

## 2013-06-07 NOTE — Progress Notes (Signed)
Subjective:    Patient ID: Mary Guzman, female    DOB: September 13, 1921, 79 y.o.   MRN: [id number]  HPI  79 year old patient who has a history of treated hypertension as well as chronic kidney disease. Patient seen and followed by oncology recently and laboratory screen revealed worsening azotemia and hyperkalemia.  Patient has been treated for hypertension with a combination ACE inhibitor and diuretic. Patient has been on this medication for 5 days. She feels well today. The patient does have a home blood pressure monitor which she uses infrequently  Past Medical History  Diagnosis Date  . GERD (gastroesophageal reflux disease)   . Thyroid disease     Hypothyroidism  . Osteoporosis   . DJD (degenerative joint disease)   . 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  . Hypertension     History   Social History  . Marital Status: Single    Spouse Name: N/A    Number of Children: N/A  . Years of Education: N/A   Occupational History  . Not on file.   Social History Main Topics  . Smoking status: Never Smoker   . Smokeless tobacco: Current User    Types: Chew  . Alcohol Use: No  . Drug Use: Not on file  . Sexual Activity: Not on file   Other Topics Concern  . Not on file   Social History Narrative   Fairly active interaction with grandchildren.    Past Surgical History  Procedure Laterality Date  . Mastectomy  1994  . Ankle surgery      Right  . Intracapsular cataract extraction      Family History  Problem Relation Age of Onset  . Alcohol abuse Brother   . Asthma Son     No Known Allergies  Current Outpatient Prescriptions on File Prior to Visit  Medication Sig Dispense Refill  . aspirin 81 MG EC tablet Take 81 mg by mouth daily.        . Calcium Carbonate (CALCIUM 500 PO) Take 1 tablet by mouth daily.        . lansoprazole (PREVACID) 30 MG capsule Take 1 capsule (30 mg total) by mouth daily.  90 capsule  3   . levothyroxine (SYNTHROID, LEVOTHROID) 75 MCG tablet Take 1 tablet (75 mcg total) by mouth daily.  90 tablet  3  . Multiple Vitamins-Minerals ([place]) Take by mouth 4 (four) times daily. Macular Protect Complete      . raloxifene (EVISTA) 60 MG tablet Take 1 tablet (60 mg total) by mouth daily.  90 tablet  3  . lisinopril-hydrochlorothiazide (PRINZIDE,ZESTORETIC) 20-25 MG per tablet Take 1 tablet by mouth daily.  90 tablet  3   No current facility-administered medications on file prior to visit.    BP 140/80  Pulse 70  Temp(Src) 98.4 F (36.9 C) (Oral)  Resp 20  Wt 148 lb (67.132 kg)  BMI 26.62 kg/m2  SpO2 98%       Review of Systems  Constitutional: Negative.   HENT: Negative for hearing loss, congestion, sore throat, rhinorrhea, dental problem, sinus pressure and tinnitus.   Eyes: Negative for pain, discharge and visual disturbance.  Respiratory: Negative for cough and shortness of breath.   Cardiovascular: Negative for chest pain, palpitations and leg swelling.  Gastrointestinal: Negative for nausea, vomiting, abdominal pain, diarrhea, constipation, blood in stool and abdominal distention.  Genitourinary: Negative for dysuria, urgency,  frequency, hematuria, flank pain, vaginal bleeding, vaginal discharge, difficulty urinating, vaginal pain and pelvic pain.  Musculoskeletal: Negative for joint swelling, arthralgias and gait problem.  Skin: Negative for rash.  Neurological: Negative for dizziness, syncope, speech difficulty, weakness, numbness and headaches.  Hematological: Negative for adenopathy.  Psychiatric/Behavioral: Negative for behavioral problems, dysphoric mood and agitation. The patient is not nervous/anxious.        Objective:   Physical Exam  Constitutional: She is oriented to person, place, and time. She appears well-developed and well-nourished.  Blood pressure 134/72  HENT:  Head: Normocephalic.  Right Ear: External ear normal.  Left Ear:  External ear normal.  Mouth/Throat: Oropharynx is clear and moist.  Eyes: Conjunctivae and EOM are normal. Pupils are equal, round, and reactive to light.  Neck: Normal range of motion. Neck supple. No thyromegaly present.  Cardiovascular: Normal rate, regular rhythm, normal heart sounds and intact distal pulses.   Pulmonary/Chest: Effort normal and breath sounds normal.  Abdominal: Soft. Bowel sounds are normal. She exhibits no mass. There is no tenderness.  Musculoskeletal: Normal range of motion.  Lymphadenopathy:    She has no cervical adenopathy.  Neurological: She is alert and oriented to person, place, and time.  Skin: Skin is warm and dry. No rash noted.  Psychiatric: She has a normal mood and affect. Her behavior is normal.          Assessment & Plan:   Hypertension. Blood pressure medication has been discontinued. We'll continue to observe off treatment. Home blood pressure monitoring encouraged. We'll reassess in 6 weeks Azotemia/hyperkalemia. Will recheck a BMet today and continue to hold combination blood pressure medication. Recheck 6 weeks

## 2013-06-07 NOTE — Patient Instructions (Signed)
Drink as much fluid as you  can tolerate over the next few days  Please check your blood pressure on a regular basis.  If it is consistently greater than 150/90, please make an office appointment.

## 2013-06-09 ENCOUNTER — Other Ambulatory Visit: Payer: Self-pay | Admitting: *Deleted

## 2013-06-09 MED ORDER — LEVOTHYROXINE SODIUM 88 MCG PO TABS: 88.0000 ug | ORAL_TABLET | Freq: Every day | ORAL | Status: DC

## 2013-06-21 ENCOUNTER — Telehealth: Payer: Self-pay | Admitting: Internal Medicine

## 2013-06-21 NOTE — Telephone Encounter (Signed)
Called [facility] spoke to pharmacist Jamas Lav, told her pt has been on generic since we have been writing Rx's. Jamas Lav said that is fine, pt wanted generic and just needed to verify to was okay. Told her that is fine.

## 2013-06-21 NOTE — Telephone Encounter (Signed)
Reference number - pop [id number]. Spoke w/ Larena Glassman, and she stated that the patient has always received brand name levothyroxine (SYNTHROID, LEVOTHROID) 88 MCG tablet. The last RX was sent over, stated that a generic was okay. The pharmacy is calling to inquire to see if Dr. Raliegh Ip would really like them to dispense a generic, or continue with name brand. Please advise.

## 2013-07-13 ENCOUNTER — Ambulatory Visit
Admission: RE | Admit: 2013-07-13 | Discharge: 2013-07-13 | Disposition: A | Discharge status: Home / Self Care | Payer: Medicare Other | Source: Ambulatory Visit | Attending: Hematology and Oncology | Admitting: Hematology and Oncology

## 2013-07-13 DIAGNOSIS — M810 Age-related osteoporosis without current pathological fracture: Secondary | ICD-10-CM

## 2013-07-13 DIAGNOSIS — Z853 Personal history of malignant neoplasm of breast: Secondary | ICD-10-CM

## 2013-07-27 ENCOUNTER — Encounter: Payer: Self-pay | Admitting: Internal Medicine

## 2013-07-27 ENCOUNTER — Ambulatory Visit (INDEPENDENT_AMBULATORY_CARE_PROVIDER_SITE_OTHER): Payer: Medicare Other | Admitting: Internal Medicine

## 2013-07-27 VITALS — BP 140/70 | HR 76 | Temp 98.1°F | Resp 20 | Wt 150.0 lb

## 2013-07-27 DIAGNOSIS — I129 Hypertensive chronic kidney disease with stage 1 through stage 4 chronic kidney disease, or unspecified chronic kidney disease: Secondary | ICD-10-CM

## 2013-07-27 DIAGNOSIS — E039 Hypothyroidism, unspecified: Secondary | ICD-10-CM

## 2013-07-27 DIAGNOSIS — I10 Essential (primary) hypertension: Secondary | ICD-10-CM

## 2013-07-27 DIAGNOSIS — N189 Chronic kidney disease, unspecified: Secondary | ICD-10-CM

## 2013-07-27 DIAGNOSIS — M810 Age-related osteoporosis without current pathological fracture: Secondary | ICD-10-CM

## 2013-07-27 LAB — BASIC METABOLIC PANEL
Chloride: 109 mEq/L (ref 96–112)
Creatinine, Ser: 1.9 mg/dL — ABNORMAL HIGH (ref 0.4–1.2)

## 2013-07-27 NOTE — Progress Notes (Signed)
Subjective:    Patient ID: Mary Guzman, female    DOB: 30-Mar-1921, 79 y.o.   MRN: [id number]  HPI Pre-visit discussion using our clinic review tool. No additional management support is needed unless otherwise documented below in the visit note.  79 year old patient who is seen today in followup. Due to hyperkalemia and renal insufficiency blood pressure medication held 6 weeks ago. She feels well today. Blood pressure medication has been held  Past Medical History  Diagnosis Date  . GERD (gastroesophageal reflux disease)   . Thyroid disease     Hypothyroidism  . Osteoporosis   . DJD (degenerative joint disease)   . 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  . Hypertension     History   Social History  . Marital Status: Single    Spouse Name: N/A    Number of Children: N/A  . Years of Education: N/A   Occupational History  . Not on file.   Social History Main Topics  . Smoking status: Never Smoker   . Smokeless tobacco: Current User    Types: Chew  . Alcohol Use: No  . Drug Use: Not on file  . Sexual Activity: Not on file   Other Topics Concern  . Not on file   Social History Narrative   Fairly active interaction with grandchildren.    Past Surgical History  Procedure Laterality Date  . Mastectomy  1994  . Ankle surgery      Right  . Intracapsular cataract extraction      Family History  Problem Relation Age of Onset  . Alcohol abuse Brother   . Asthma Son     No Known Allergies  Current Outpatient Prescriptions on File Prior to Visit  Medication Sig Dispense Refill  . aspirin 81 MG EC tablet Take 81 mg by mouth daily.        . Calcium Carbonate (CALCIUM 500 PO) Take 1 tablet by mouth daily.        . Cholecalciferol (VITAMIN D3) 5000 UNITS CAPS Take 1 capsule by mouth daily.      . lansoprazole (PREVACID) 30 MG capsule Take 1 capsule (30 mg total) by mouth daily.  90 capsule  3  . levothyroxine  (SYNTHROID, LEVOTHROID) 88 MCG tablet Take 1 tablet (88 mcg total) by mouth daily.  90 tablet  3  . Multiple Vitamins-Minerals ([place]) Take by mouth 4 (four) times daily. Macular Protect Complete      . raloxifene (EVISTA) 60 MG tablet Take 1 tablet (60 mg total) by mouth daily.  90 tablet  3   No current facility-administered medications on file prior to visit.    BP 140/70  Pulse 76  Temp(Src) 98.1 F (36.7 C) (Oral)  Resp 20  Wt 150 lb (68.04 kg)  SpO2 96%       Review of Systems  Constitutional: Negative.   HENT: Negative for congestion, dental problem, hearing loss, rhinorrhea, sinus pressure, sore throat and tinnitus.   Eyes: Negative for pain, discharge and visual disturbance.  Respiratory: Negative for cough and shortness of breath.   Cardiovascular: Negative for chest pain, palpitations and leg swelling.  Gastrointestinal: Negative for nausea, vomiting, abdominal pain, diarrhea, constipation, blood in stool and abdominal distention.  Genitourinary: Negative for dysuria, urgency, frequency, hematuria, flank pain, vaginal bleeding, vaginal discharge, difficulty urinating, vaginal pain and pelvic pain.  Musculoskeletal: Negative for arthralgias, gait problem and  joint swelling.  Skin: Negative for rash.  Neurological: Negative for dizziness, syncope, speech difficulty, weakness, numbness and headaches.  Hematological: Negative for adenopathy.  Psychiatric/Behavioral: Negative for behavioral problems, dysphoric mood and agitation. The patient is not nervous/anxious.        Objective:   Physical Exam  Constitutional: She appears well-developed and well-nourished. No distress.  Blood pressure 140/70          Assessment & Plan:   Hypertension. The blood pressure presently controlled off therapy. We'll continue to observe without treatment. Chronic kidney disease. We'll check a renal indices and potassium level.  Recheck in 3 months as  scheduled Home blood pressure monitoring encouraged

## 2013-07-27 NOTE — Progress Notes (Signed)
Pre-visit discussion using our clinic review tool. No additional management support is needed unless otherwise documented below in the visit note.  

## 2013-07-27 NOTE — Patient Instructions (Signed)
Limit your sodium (Salt) intake  Return in 3 months for follow-up   

## 2013-11-09 ENCOUNTER — Ambulatory Visit (INDEPENDENT_AMBULATORY_CARE_PROVIDER_SITE_OTHER): Payer: Medicare Other | Admitting: Internal Medicine

## 2013-11-09 ENCOUNTER — Encounter: Payer: Self-pay | Admitting: Internal Medicine

## 2013-11-09 VITALS — BP 156/80 | HR 78 | Temp 98.5°F | Resp 20 | Ht 63.0 in | Wt 147.0 lb

## 2013-11-09 DIAGNOSIS — Z0000 Encounter for general adult medical examination without abnormal findings: Secondary | ICD-10-CM

## 2013-11-09 DIAGNOSIS — I10 Essential (primary) hypertension: Secondary | ICD-10-CM

## 2013-11-09 DIAGNOSIS — K219 Gastro-esophageal reflux disease without esophagitis: Secondary | ICD-10-CM

## 2013-11-09 DIAGNOSIS — M810 Age-related osteoporosis without current pathological fracture: Secondary | ICD-10-CM

## 2013-11-09 DIAGNOSIS — Z23 Encounter for immunization: Secondary | ICD-10-CM

## 2013-11-09 DIAGNOSIS — M1990 Unspecified osteoarthritis, unspecified site: Secondary | ICD-10-CM

## 2013-11-09 DIAGNOSIS — N189 Chronic kidney disease, unspecified: Secondary | ICD-10-CM

## 2013-11-09 DIAGNOSIS — E039 Hypothyroidism, unspecified: Secondary | ICD-10-CM

## 2013-11-09 LAB — COMPREHENSIVE METABOLIC PANEL
ALBUMIN: 3.7 g/dL (ref 3.5–5.2)
ALT: 16 U/L (ref 0–35)
AST: 32 U/L (ref 0–37)
Alkaline Phosphatase: 48 U/L (ref 39–117)
BUN: 32 mg/dL — AB (ref 6–23)
CALCIUM: 8.7 mg/dL (ref 8.4–10.5)
CHLORIDE: 109 meq/L (ref 96–112)
CO2: 24 mEq/L (ref 19–32)
CREATININE: 1.8 mg/dL — AB (ref 0.4–1.2)
GFR: 27.69 mL/min — AB (ref >60.00)
GLUCOSE: 79 mg/dL (ref 70–99)
POTASSIUM: 4.8 meq/L (ref 3.5–5.1)
Sodium: 140 mEq/L (ref 135–145)
Total Bilirubin: 0.5 mg/dL (ref 0.3–1.2)
Total Protein: 6.5 g/dL (ref 6.0–8.3)

## 2013-11-09 LAB — CBC WITH DIFFERENTIAL/PLATELET
BASOS PCT: 0.6 % (ref 0.0–3.0)
Basophils Absolute: 0 10*3/uL (ref 0.0–0.1)
EOS ABS: 0.2 10*3/uL (ref 0.0–0.7)
EOS PCT: 3.6 % (ref 0.0–5.0)
HCT: 31.7 % — ABNORMAL LOW (ref 36.0–46.0)
HEMOGLOBIN: 10.3 g/dL — AB (ref 12.0–15.0)
LYMPHS PCT: 21.2 % (ref 12.0–46.0)
Lymphs Abs: 1.2 10*3/uL (ref 0.7–4.0)
MCHC: 32.6 g/dL (ref 30.0–36.0)
MCV: 87 fl (ref 78.0–100.0)
MONOS PCT: 9.1 % (ref 3.0–12.0)
Monocytes Absolute: 0.5 10*3/uL (ref 0.1–1.0)
NEUTROS ABS: 3.8 10*3/uL (ref 1.4–7.7)
NEUTROS PCT: 65.5 % (ref 43.0–77.0)
Platelets: 236 10*3/uL (ref 150.0–400.0)
RBC: 3.64 Mil/uL — AB (ref 3.87–5.11)
RDW: 14.9 % — ABNORMAL HIGH (ref 11.5–14.6)
WBC: 5.8 10*3/uL (ref 4.5–10.5)

## 2013-11-09 LAB — TSH: TSH: 1.04 u[IU]/mL (ref 0.35–5.50)

## 2013-11-09 MED ORDER — LANSOPRAZOLE 30 MG PO CPDR: 30.0000 mg | DELAYED_RELEASE_CAPSULE | Freq: Every day | ORAL | Status: DC

## 2013-11-09 NOTE — Patient Instructions (Signed)
Limit your sodium (Salt) intake  Take a calcium supplement, plus 800-1200 units of vitamin D  Return in one year for follow-up  

## 2013-11-09 NOTE — Progress Notes (Signed)
Pre-visit discussion using our clinic review tool. No additional management support is needed unless otherwise documented below in the visit note.  

## 2013-11-09 NOTE — Progress Notes (Signed)
Patient ID: Mary Guzman, female   DOB: 1920-10-11, 80 y.o.   MRN: [id number]  Subjective:    Patient ID: Mary Guzman, female    DOB: Sep 19, 1920, 80 y.o.   MRN: [id number]  HPI 80 year-old patient who is seen today for her annual followup. She has a history of treated hypothyroidism as well as treated hypertension. She has done quite well. She has a history of  breast cancer and osteoporosis. She is on calcium and vitamin D supplements. She has a history of gastroesophageal reflux disease which has been stable. She has done well without any new concerns or complaints.   Past Medical History  Diagnosis Date  . GERD (gastroesophageal reflux disease)   . Thyroid disease     Hypothyroidism  . Osteoporosis   . DJD (degenerative joint disease)   . 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  . Hypertension     History   Social History  . Marital Status: Single    Spouse Name: N/A    Number of Children: N/A  . Years of Education: N/A   Occupational History  . Not on file.   Social History Main Topics  . Smoking status: Never Smoker   . Smokeless tobacco: Current User    Types: Chew  . Alcohol Use: No  . Drug Use: Not on file  . Sexual Activity: Not on file   Other Topics Concern  . Not on file   Social History Narrative   Fairly active interaction with grandchildren.    Past Surgical History  Procedure Laterality Date  . Mastectomy  1994  . Ankle surgery      Right  . Intracapsular cataract extraction      Family History  Problem Relation Age of Onset  . Alcohol abuse Brother   . Asthma Son     No Known Allergies  Current Outpatient Prescriptions on File Prior to Visit  Medication Sig Dispense Refill  . aspirin 81 MG EC tablet Take 81 mg by mouth daily.        . Calcium Carbonate (CALCIUM 500 PO) Take 1 tablet by mouth daily.        . Cholecalciferol (VITAMIN D3) 5000 UNITS CAPS Take 1 capsule by mouth  daily.      . lansoprazole (PREVACID) 30 MG capsule Take 1 capsule (30 mg total) by mouth daily.  90 capsule  3  . levothyroxine (SYNTHROID, LEVOTHROID) 88 MCG tablet Take 1 tablet (88 mcg total) by mouth daily.  90 tablet  3  . Multiple Vitamins-Minerals ([place]) Take by mouth 4 (four) times daily. Macular Protect Complete      . raloxifene (EVISTA) 60 MG tablet Take 1 tablet (60 mg total) by mouth daily.  90 tablet  3   No current facility-administered medications on file prior to visit.    BP 156/80  Pulse 78  Temp(Src) 98.5 F (36.9 C) (Oral)  Resp 20  Ht 5\' 3"  (1.6 m)  Wt 147 lb (66.679 kg)  BMI 26.05 kg/m2  SpO2 97%   1. Risk factors, based on past  M,S,F history- cardio vascular factors include hypertension and age  80.  Physical activities: Fairly sedentary but does well for 80 years of age  9.  Depression/mood: No history of depression or mood disorder  4.  Hearing: Mild deficit only  5.  ADL's: Requires assistance with aspects of daily  living  6.  Fall risk: Moderate due to age and slight instability  7.  Home safety: No problems identified   8.  Height weight, and visual acuity; height and weight stable no change in visual acuity  9.  Counseling: Heart healthy diet encouraged  10. Lab orders based on risk factors: TSH and laboratory update reviewed  11. Referral : Not appropriate at this time  12. Care plan: Continue the aggressive blood pressure control  13. Cognitive assessment: Alert and oriented normal affect. No cognitive dysfunction      Review of Systems  Constitutional: Negative.   HENT: Negative for congestion, dental problem, hearing loss, rhinorrhea, sinus pressure, sore throat and tinnitus.   Eyes: Negative for pain, discharge and visual disturbance.  Respiratory: Negative for cough and shortness of breath.   Cardiovascular: Negative for chest pain, palpitations and leg swelling.  Gastrointestinal: Negative for nausea,  vomiting, abdominal pain, diarrhea, constipation, blood in stool and abdominal distention.  Genitourinary: Negative for dysuria, urgency, frequency, hematuria, flank pain, vaginal bleeding, vaginal discharge, difficulty urinating, vaginal pain and pelvic pain.  Musculoskeletal: Positive for arthralgias. Negative for gait problem and joint swelling.          Skin: Negative for rash.  Neurological: Negative for dizziness, syncope, speech difficulty, weakness, numbness and headaches.  Hematological: Negative for adenopathy.  Psychiatric/Behavioral: Negative for behavioral problems, dysphoric mood and agitation. The patient is not nervous/anxious.        Objective:   Physical Exam  Constitutional: She is oriented to person, place, and time. She appears well-developed and well-nourished.  HENT:  Head: Normocephalic.  Right Ear: External ear normal.  Left Ear: External ear normal.  Mouth/Throat: Oropharynx is clear and moist.  Eyes: Conjunctivae and EOM are normal. Pupils are equal, round, and reactive to light.  Neck: Normal range of motion. Neck supple. No thyromegaly present.  Cardiovascular: Normal rate, regular rhythm and normal heart sounds.   Dorsalis pedis pulses full.  Posterior tibial pulses not easily palpable  Pulmonary/Chest: Effort normal and breath sounds normal.  Abdominal: Soft. Bowel sounds are normal. She exhibits no mass. There is no tenderness.  Genitourinary: Guaiac negative stool.  Musculoskeletal: Normal range of motion.  Osteoarthritic changes in the small joints  Trace ankle edema  Lymphadenopathy:    She has no cervical adenopathy.  Neurological: She is alert and oriented to person, place, and time.  Skin: Skin is warm and dry. No rash noted.  Psychiatric: She has a normal mood and affect. Her behavior is normal.          Assessment & Plan:  Preventive health examination Osteoporosis. We'll continue  calcium and vitamin D supplementation Hypertension.  Well controlled we'll continue her present antihypertensive regimen. Hypothyroidism. We'll check a TSH. Gastroesophageal reflux disease stable on present regimen  We'll recheck in 12 months

## 2013-11-10 ENCOUNTER — Telehealth: Payer: Self-pay | Admitting: Internal Medicine

## 2013-11-10 NOTE — Telephone Encounter (Signed)
Relevant patient education mailed to patient.  

## 2014-01-03 ENCOUNTER — Other Ambulatory Visit: Payer: Self-pay | Admitting: Internal Medicine

## 2014-01-03 ENCOUNTER — Ambulatory Visit (HOSPITAL_COMMUNITY): Payer: Medicare Other | Attending: Internal Medicine | Admitting: Cardiology

## 2014-01-03 ENCOUNTER — Telehealth: Payer: Self-pay | Admitting: Internal Medicine

## 2014-01-03 DIAGNOSIS — R600 Localized edema: Secondary | ICD-10-CM

## 2014-01-03 DIAGNOSIS — M79609 Pain in unspecified limb: Secondary | ICD-10-CM | POA: Insufficient documentation

## 2014-01-03 DIAGNOSIS — M79662 Pain in left lower leg: Secondary | ICD-10-CM

## 2014-01-03 DIAGNOSIS — R609 Edema, unspecified: Secondary | ICD-10-CM | POA: Insufficient documentation

## 2014-01-03 NOTE — Progress Notes (Signed)
Lower venous duplex bilateral complete 

## 2014-01-03 NOTE — Telephone Encounter (Signed)
Patient Information:  Caller Name: [name]  Phone: [phone]  Patient: Mary Guzman  Gender: Female  DOB: 03/19/1921  Age: 80 Years  PCP: Bluford Kaufmann (Family Practice > 1yrs old)  Office Follow Up:  Does the office need to follow up with this patient?: Yes  Instructions For The Office: Please call ASAP to advise where to be seen.  RN Note:  [name] called from work; last saw her mom  01/02/14.  Conferenced with Aeris at [phone]. Left leg is more edamatous than right leg; left calf pain is intermittent.  Per EMR, appointment at 1345 was not scheduled yet.  Discussed see now vs ED dispositon with Kathyrn/office nurse who requested message be sent now and she will discuss with MD and call [name]/daughter back. [name] notified and said she will need at least 1 hour to get to office.   Symptoms  Reason For Call & Symptoms: Worsening edema of both legs over past 2 weeks; edema worsened over weekend and intermittent pain in left lower calf. Also noted decreased hearing  left ear following viral upper respiratory infection.  Reports she was scheduled to see Dr Maudie Mercury for 1345; asking if OK to wait to see Dr. Burnice Logan  Reviewed Health History In EMR: Yes  Reviewed Medications In EMR: Yes  Reviewed Allergies In EMR: Yes  Reviewed Surgeries / Procedures: Yes  Date of Onset of Symptoms: 12/20/2013  Guideline(s) Used:  Leg Swelling and Edema  Disposition Per Guideline:   Go to ED Now (or to Office with PCP Approval)  Reason For Disposition Reached:   Thigh, calf, or ankle swelling in both legs, but one side is definitely more swollen  Advice Given:  Varicose Veins - Treatment:  Try to rest and elevate your legs above your heart a couple times each day for 15 minutes.  Walking is good for your blood flow (Reason: helps pump the blood out of the veins).  Avoid prolonged standing in one place.  Call Back If:  Swelling becomes worse  Swelling becomes red or painful to the  touch  Calf pain occurs and becomes constant  You become worse.  RN Overrode Recommendation:  Follow Up With Office Later  Office to call back with MD recommendation

## 2014-01-03 NOTE — Telephone Encounter (Signed)
Received call from [facility] in the Vascular Lab, Venous Doppler was negative. Told her okay can let pt go home and I will let Dr. Raliegh Ip know and I will call her with follow up. Gina verbalized understanding.

## 2014-01-03 NOTE — Telephone Encounter (Signed)
Discussed result with Dr. Raliegh Ip, he said pt to elevate legs and schedule follow up end of week.

## 2014-01-03 NOTE — Telephone Encounter (Signed)
STAT order done for Venous Doppler Bilateral per Dr. Raliegh Ip.

## 2014-01-03 NOTE — Telephone Encounter (Signed)
Spoke to pt and daughter [name], told them venous doppler was negative and pt should elevate legs and make follow up appointment end of week. [name] and pt verbalized understanding and [name] said pt legs are red also. Told her okay, elevate legs and will see pt on Wed at 11:00 with Dr. Raliegh Ip. Appointment scheduled. [name] verbalized understanding.

## 2014-01-05 ENCOUNTER — Ambulatory Visit (INDEPENDENT_AMBULATORY_CARE_PROVIDER_SITE_OTHER): Payer: Medicare Other | Admitting: Internal Medicine

## 2014-01-05 ENCOUNTER — Encounter: Payer: Self-pay | Admitting: Internal Medicine

## 2014-01-05 VITALS — BP 158/70 | HR 68 | Temp 98.3°F | Resp 20 | Ht 63.0 in | Wt 144.0 lb

## 2014-01-05 DIAGNOSIS — I10 Essential (primary) hypertension: Secondary | ICD-10-CM

## 2014-01-05 DIAGNOSIS — H6120 Impacted cerumen, unspecified ear: Secondary | ICD-10-CM

## 2014-01-05 DIAGNOSIS — L03116 Cellulitis of left lower limb: Secondary | ICD-10-CM

## 2014-01-05 DIAGNOSIS — K219 Gastro-esophageal reflux disease without esophagitis: Secondary | ICD-10-CM

## 2014-01-05 DIAGNOSIS — H918X9 Other specified hearing loss, unspecified ear: Secondary | ICD-10-CM

## 2014-01-05 DIAGNOSIS — L03119 Cellulitis of unspecified part of limb: Secondary | ICD-10-CM

## 2014-01-05 DIAGNOSIS — L02419 Cutaneous abscess of limb, unspecified: Secondary | ICD-10-CM

## 2014-01-05 DIAGNOSIS — E039 Hypothyroidism, unspecified: Secondary | ICD-10-CM

## 2014-01-05 DIAGNOSIS — N189 Chronic kidney disease, unspecified: Secondary | ICD-10-CM

## 2014-01-05 DIAGNOSIS — M1990 Unspecified osteoarthritis, unspecified site: Secondary | ICD-10-CM

## 2014-01-05 MED ORDER — CEFUROXIME AXETIL 250 MG PO TABS: 250.0000 mg | ORAL_TABLET | Freq: Two times a day (BID) | ORAL | Status: DC

## 2014-01-05 NOTE — Progress Notes (Signed)
Pre-visit discussion using our clinic review tool. No additional management support is needed unless otherwise documented below in the visit note.  

## 2014-01-05 NOTE — Patient Instructions (Signed)
Limit your sodium (Salt) intake  Take your antibiotic as prescribed until ALL of it is gone, but stop if you develop a rash, swelling, or any side effects of the medication.  Contact our office as soon as possible if  there are side effects of the medication.  Cellulitis Cellulitis is an infection of the skin and the tissue under the skin. The infected area is usually red and tender. This happens most often in the arms and lower legs. HOME CARE   Take your antibiotic medicine as told. Finish the medicine even if you start to feel better.  Keep the infected arm or leg raised (elevated).  Put a warm cloth on the area up to 4 times per day.  Only take medicines as told by your doctor.  Keep all doctor visits as told. GET HELP RIGHT AWAY IF:   You have a fever.  You feel very sleepy.  You throw up (vomit) or have watery poop (diarrhea).  You feel sick and have muscle aches and pains.  You see red streaks on the skin coming from the infected area.  Your red area gets bigger or turns a dark color.  Your bone or joint under the infected area is painful after the skin heals.  Your infection comes back in the same area or different area.  You have a puffy (swollen) bump in the infected area.  You have new symptoms. MAKE SURE YOU:   Understand these instructions.  Will watch your condition.  Will get help right away if you are not doing well or get worse. Document Released: 02/19/2008 Document Revised: 03/03/2012 Document Reviewed: 11/18/2011 [facility] Patient Information 2014 [place], [place].

## 2014-01-05 NOTE — Progress Notes (Signed)
Subjective:    Patient ID: Mary Guzman, female    DOB: Oct 09, 1920, 80 y.o.   MRN: [id number]  HPI  80 year old patient who is seen today in followup.  For the past 2 or 3 weeks has had increase in swelling involving primarily her left leg.  She has had a recent outpatient venous Doppler study performed that was negative for DVT.  Over the past couple days.  She has had increasing redness and pain involving her left lower leg.  She denies any pulmonary complaints.  She does have a history of treated hypertension, chronic kidney disease, and osteoarthritis.  She has treated hypothyroidism.  The patient also complained of decreased artery acutely from the right ear  Past Medical History  Diagnosis Date  . GERD (gastroesophageal reflux disease)   . Thyroid disease     Hypothyroidism  . Osteoporosis   . DJD (degenerative joint disease)   . 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  . Hypertension     History   Social History  . Marital Status: Single    Spouse Name: N/A    Number of Children: N/A  . Years of Education: N/A   Occupational History  . Not on file.   Social History Main Topics  . Smoking status: Never Smoker   . Smokeless tobacco: Current User    Types: Chew  . Alcohol Use: No  . Drug Use: Not on file  . Sexual Activity: Not on file   Other Topics Concern  . Not on file   Social History Narrative   Fairly active interaction with grandchildren.    Past Surgical History  Procedure Laterality Date  . Mastectomy  1994  . Ankle surgery      Right  . Intracapsular cataract extraction      Family History  Problem Relation Age of Onset  . Alcohol abuse Brother   . Asthma Son     No Known Allergies  Current Outpatient Prescriptions on File Prior to Visit  Medication Sig Dispense Refill  . aspirin 81 MG EC tablet Take 81 mg by mouth daily.        . Calcium Carbonate (CALCIUM 500 PO) Take 1 tablet  by mouth daily.        . Cholecalciferol (VITAMIN D3) 5000 UNITS CAPS Take 1 capsule by mouth daily.      . lansoprazole (PREVACID) 30 MG capsule Take 1 capsule (30 mg total) by mouth daily.  90 capsule  3  . levothyroxine (SYNTHROID, LEVOTHROID) 88 MCG tablet Take 1 tablet (88 mcg total) by mouth daily.  90 tablet  3  . raloxifene (EVISTA) 60 MG tablet Take 1 tablet (60 mg total) by mouth daily.  90 tablet  3   No current facility-administered medications on file prior to visit.    BP 158/70  Pulse 68  Temp(Src) 98.3 F (36.8 C) (Oral)  Resp 20  Ht 5\' 3"  (1.6 m)  Wt 144 lb (65.318 kg)  BMI 25.51 kg/m2  SpO2 94%       Review of Systems  Constitutional: Negative.   HENT: Positive for hearing loss. Negative for congestion, dental problem, rhinorrhea, sinus pressure, sore throat and tinnitus.   Eyes: Negative for pain, discharge and visual disturbance.  Respiratory: Negative for cough and shortness of breath.   Cardiovascular: Positive for leg swelling. Negative for chest pain and palpitations.  Gastrointestinal: Negative for nausea,  vomiting, abdominal pain, diarrhea, constipation, blood in stool and abdominal distention.  Genitourinary: Negative for dysuria, urgency, frequency, hematuria, flank pain, vaginal bleeding, vaginal discharge, difficulty urinating, vaginal pain and pelvic pain.  Musculoskeletal: Negative for arthralgias, gait problem and joint swelling.  Skin: Positive for rash.  Neurological: Negative for dizziness, syncope, speech difficulty, weakness, numbness and headaches.  Hematological: Negative for adenopathy.  Psychiatric/Behavioral: Negative for behavioral problems, dysphoric mood and agitation. The patient is not nervous/anxious.        Objective:   Physical Exam  Constitutional: She appears well-developed and well-nourished. No distress.  HENT:  Cerumen impaction right canal  Musculoskeletal:  Both legs with distal edema.  This changes.  Some chronic  swelling.  Involving the right ankle due to a prior fracture.  She did have increasing warmth, tenderness, and erythema involving her distal left lower leg          Assessment & Plan:   Early cellulitis, left lower leg.  Recent venous Doppler study negative for DVT.  Will place on antibiotic therapy Hypertension stable Hypothyroidism.  We'll schedule CPX and check a TSH Cerumen  impaction right canal.  Right ear irrigated until clear

## 2014-01-24 ENCOUNTER — Encounter: Payer: Self-pay | Admitting: Internal Medicine

## 2014-01-24 ENCOUNTER — Ambulatory Visit (INDEPENDENT_AMBULATORY_CARE_PROVIDER_SITE_OTHER): Payer: Medicare Other | Admitting: Internal Medicine

## 2014-01-24 ENCOUNTER — Telehealth: Payer: Self-pay | Admitting: Internal Medicine

## 2014-01-24 VITALS — BP 134/76 | HR 81 | Temp 98.3°F | Resp 20 | Ht 63.0 in | Wt 143.0 lb

## 2014-01-24 DIAGNOSIS — I10 Essential (primary) hypertension: Secondary | ICD-10-CM

## 2014-01-24 DIAGNOSIS — I872 Venous insufficiency (chronic) (peripheral): Secondary | ICD-10-CM

## 2014-01-24 MED ORDER — FUROSEMIDE 20 MG PO TABS: 20.0000 mg | ORAL_TABLET | Freq: Every day | ORAL | Status: DC

## 2014-01-24 NOTE — Patient Instructions (Addendum)
Venous Stasis or Chronic Venous Insufficiency Chronic venous insufficiency, also called venous stasis, is a condition that affects the veins in the legs. The condition prevents blood from being pumped through these veins effectively. Blood may no longer be pumped effectively from the legs back to the heart. This condition can range from mild to severe. With proper treatment, you should be able to continue with an active life. CAUSES  Chronic venous insufficiency occurs when the vein walls become stretched, weakened, or damaged or when valves within the vein are damaged. Some common causes of this include:  High blood pressure inside the veins (venous hypertension).  Increased blood pressure in the leg veins from long periods of sitting or standing.  A blood clot that blocks blood flow in a vein (deep vein thrombosis).  Inflammation of a superficial vein (phlebitis) that causes a blood clot to form. RISK FACTORS Various things can make you more likely to develop chronic venous insufficiency, including:  Family history of this condition.  Obesity.  Pregnancy.  Sedentary lifestyle.  Smoking.  Jobs requiring long periods of standing or sitting in one place.  Being a certain age. Women in their 64s and 80s and men in their 35s are more likely to develop this condition. SIGNS AND SYMPTOMS  Symptoms may include:   Varicose veins.  Skin breakdown or ulcers.  Reddened or discolored skin on the leg.  Brown, smooth, tight, and painful skin just above the ankle, usually on the inside surface (lipodermatosclerosis).  Swelling. DIAGNOSIS  To diagnose this condition, your health care provider will take a medical history and do a physical exam. The following tests may be ordered to confirm the diagnosis:  Duplex ultrasound A procedure that produces a picture of a blood vessel and nearby organs and also provides information on blood flow through the blood vessel.  Plethysmography A  procedure that tests blood flow.  A venogram, or venography A procedure used to look at the veins using X-ray and dye. TREATMENT The goals of treatment are to help you return to an active life and to minimize pain or disability. Treatment will depend on the severity of the condition. Medical procedures may be needed for severe cases. Treatment options may include:   Use of compression stockings. These can help with symptoms and lower the chances of the problem getting worse, but they do not cure the problem.  Sclerotherapy A procedure involving an injection of a material that "dissolves" the damaged veins. Other veins in the network of blood vessels take over the function of the damaged veins.  Surgery to remove the vein or cut off blood flow through the vein (vein stripping or laser ablation surgery).  Surgery to repair a valve. HOME CARE INSTRUCTIONS   Wear compression stockings as directed by your health care provider.  Only take over-the-counter or prescription medicines for pain, discomfort, or fever as directed by your health care provider.  Follow up with your health care provider as directed. SEEK MEDICAL CARE IF:   You have redness, swelling, or increasing pain in the affected area.  You see a red streak or line that extends up or down from the affected area.  You have a breakdown or loss of skin in the affected area, even if the breakdown is small.  You have an injury to the affected area. SEEK IMMEDIATE MEDICAL CARE IF:   You have an injury and open wound in the affected area.  Your pain is severe and does not improve with  medicine.  You have sudden numbness or weakness in the foot or ankle below the affected area, or you have trouble moving your foot or ankle.  You have a fever or persistent symptoms for more than 2 3 days.  You have a fever and your symptoms suddenly get worse. MAKE SURE YOU:   Understand these instructions.  Will watch your condition.  Will  get help right away if you are not doing well or get worse. Document Released: 01/06/2007 Document Revised: 06/23/2013 Document Reviewed: 05/10/2013 [facility] Patient Information 2014 [place].   Limit your sodium (Salt) intake  Keep your legs elevated as much as possible  2 Gram Low Sodium Diet A 2 gram sodium diet restricts the amount of sodium in the diet to no more than 2 g or 2000 mg daily. Limiting the amount of sodium is often used to help lower blood pressure. It is important if you have heart, liver, or kidney problems. Many foods contain sodium for flavor and sometimes as a preservative. When the amount of sodium in a diet needs to be low, it is important to know what to look for when choosing foods and drinks. The following includes some information and guidelines to help make it easier for you to adapt to a low sodium diet. QUICK TIPS  Do not add salt to food.  Avoid convenience items and fast food.  Choose unsalted snack foods.  Buy lower sodium products, often labeled as "lower sodium" or "no salt added."  Check food labels to learn how much sodium is in 1 serving.  When eating at a restaurant, ask that your food be prepared with less salt or none, if possible. READING FOOD LABELS FOR SODIUM INFORMATION The nutrition facts label is a good place to find how much sodium is in foods. Look for products with no more than 500 to 600 mg of sodium per meal and no more than 150 mg per serving. Remember that 2 g = 2000 mg. The food label may also list foods as:  Sodium-free: Less than 5 mg in a serving.  Very low sodium: 35 mg or less in a serving.  Low-sodium: 140 mg or less in a serving.  Light in sodium: 50% less sodium in a serving. For example, if a food that usually has 300 mg of sodium is changed to become light in sodium, it will have 150 mg of sodium.  Reduced sodium: 25% less sodium in a serving. For example, if a food that usually has 400 mg of sodium is  changed to reduced sodium, it will have 300 mg of sodium. CHOOSING FOODS Grains  Avoid: Salted crackers and snack items. Some cereals, including instant hot cereals. Bread stuffing and biscuit mixes. Seasoned rice or pasta mixes.  Choose: Unsalted snack items. Low-sodium cereals, oats, puffed wheat and rice, shredded wheat. English muffins and bread. Pasta. Meats  Avoid: Salted, canned, smoked, spiced, pickled meats, including fish and poultry. Bacon, ham, sausage, cold cuts, hot dogs, anchovies.  Choose: Low-sodium canned tuna and salmon. Fresh or frozen meat, poultry, and fish. Dairy  Avoid: Processed cheese and spreads. Cottage cheese. Buttermilk and condensed milk. Regular cheese.  Choose: Milk. Low-sodium cottage cheese. Yogurt. Sour cream. Low-sodium cheese. Fruits and Vegetables  Avoid: Regular canned vegetables. Regular canned tomato sauce and paste. Frozen vegetables in sauces. Olives. Angie Fava. Relishes. Sauerkraut.  Choose: Low-sodium canned vegetables. Low-sodium tomato sauce and paste. Frozen or fresh vegetables. Fresh and frozen fruit. Condiments  Avoid: Canned and packaged  gravies. Worcestershire sauce. Tartar sauce. Barbecue sauce. Soy sauce. Steak sauce. Ketchup. Onion, garlic, and table salt. Meat flavorings and tenderizers.  Choose: Fresh and dried herbs and spices. Low-sodium varieties of mustard and ketchup. Lemon juice. Tabasco sauce. Horseradish. SAMPLE 2 GRAM SODIUM MEAL PLAN Breakfast / Sodium (mg)  1 cup low-fat milk / [id number] mg  2 slices whole-wheat toast / 270 mg  1 tbs heart-healthy margarine / 153 mg  1 hard-boiled egg / 139 mg  1 small orange / 0 mg Lunch / Sodium (mg)  1 cup raw carrots / 76 mg   cup hummus / 298 mg  1 cup low-fat milk / 143 mg   cup red grapes / 2 mg  1 whole-wheat pita bread / 356 mg Dinner / Sodium (mg)  1 cup whole-wheat pasta / 2 mg  1 cup low-sodium tomato sauce / 73 mg  3 oz lean ground beef / 57 mg  1  small side salad (1 cup raw spinach leaves,  cup cucumber,  cup yellow bell pepper) with 1 tsp olive oil and 1 tsp red wine vinegar / 25 mg Snack / Sodium (mg)  1 container low-fat vanilla yogurt / 107 mg  3 graham cracker squares / 127 mg Nutrient Analysis  Calories: 2033  Protein: 77 g  Carbohydrate: 282 g  Fat: 72 g  Sodium: 1971 mg Document Released: 09/02/2005 Document Revised: 11/25/2011 Document Reviewed: 12/04/2009 [employer] Patient Information 2014 [place].

## 2014-01-24 NOTE — Progress Notes (Signed)
Pre-visit discussion using our clinic review tool. No additional management support is needed unless otherwise documented below in the visit note.  

## 2014-01-24 NOTE — Progress Notes (Signed)
Subjective:    Patient ID: Mary Guzman, female    DOB: Apr 05, 1921, 80 y.o.   MRN: [id number]  HPI  80 year old patient , who was treated 2 weeks ago for a early cellulitis involving the left lower extremity.  Venous Doppler studies recently have been negative for DVT.  Today she complains of bilateral lower extremity swelling.  No shortness of breath or chest pain She has a remote history of breast cancer, chronic kidney disease, and hypertension.  Past Medical History  Diagnosis Date  . GERD (gastroesophageal reflux disease)   . Thyroid disease     Hypothyroidism  . Osteoporosis   . DJD (degenerative joint disease)   . 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  . Hypertension     History   Social History  . Marital Status: Single    Spouse Name: N/A    Number of Children: N/A  . Years of Education: N/A   Occupational History  . Not on file.   Social History Main Topics  . Smoking status: Never Smoker   . Smokeless tobacco: Current User    Types: Chew  . Alcohol Use: No  . Drug Use: Not on file  . Sexual Activity: Not on file   Other Topics Concern  . Not on file   Social History Narrative   Fairly active interaction with grandchildren.    Past Surgical History  Procedure Laterality Date  . Mastectomy  1994  . Ankle surgery      Right  . Intracapsular cataract extraction      Family History  Problem Relation Age of Onset  . Alcohol abuse Brother   . Asthma Son     No Known Allergies  Current Outpatient Prescriptions on File Prior to Visit  Medication Sig Dispense Refill  . aspirin 81 MG EC tablet Take 81 mg by mouth daily.        . Calcium Carbonate (CALCIUM 500 PO) Take 1 tablet by mouth daily.        . Cholecalciferol (VITAMIN D3) 5000 UNITS CAPS Take 1 capsule by mouth daily.      . lansoprazole (PREVACID) 30 MG capsule Take 1 capsule (30 mg total) by mouth daily.  90 capsule  3  .  levothyroxine (SYNTHROID, LEVOTHROID) 88 MCG tablet Take 1 tablet (88 mcg total) by mouth daily.  90 tablet  3  . Multiple Vitamins-Minerals (PRESERVISION AREDS 2 PO) Take 2 tablets by mouth 2 (two) times daily.      . raloxifene (EVISTA) 60 MG tablet Take 1 tablet (60 mg total) by mouth daily.  90 tablet  3   No current facility-administered medications on file prior to visit.    BP 134/76  Pulse 81  Temp(Src) 98.3 F (36.8 C) (Oral)  Resp 20  Ht 5\' 3"  (1.6 m)  Wt 143 lb (64.864 kg)  BMI 25.34 kg/m2       Review of Systems  Constitutional: Negative.   HENT: Negative for congestion, dental problem, hearing loss, rhinorrhea, sinus pressure, sore throat and tinnitus.   Eyes: Negative for pain, discharge and visual disturbance.  Respiratory: Negative for cough and shortness of breath.   Cardiovascular: Positive for leg swelling. Negative for chest pain and palpitations.  Gastrointestinal: Negative for nausea, vomiting, abdominal pain, diarrhea, constipation, blood in stool and abdominal distention.  Genitourinary: Negative for dysuria, urgency, frequency, hematuria, flank pain, vaginal bleeding, vaginal discharge,  difficulty urinating, vaginal pain and pelvic pain.  Musculoskeletal: Negative for arthralgias, gait problem and joint swelling.  Skin: Negative for rash.  Neurological: Negative for dizziness, syncope, speech difficulty, weakness, numbness and headaches.  Hematological: Negative for adenopathy.  Psychiatric/Behavioral: Negative for behavioral problems, dysphoric mood and agitation. The patient is not nervous/anxious.        Objective:   Physical Exam  Constitutional: She appears well-developed and well-nourished. No distress.  Musculoskeletal: She exhibits edema.  Bilateral lower extremity edema.  Distal to the knees Stasis dermatitis changes left lower leg greater than the right Chronic right ankle edema Edema is somewhat asymmetric with more distal involvement on  the right and more proximal involvement on the left          Assessment & Plan:   Venous insufficiency Status post recent cellulitis  Will place on a low salt diet elevate and low-dose diuretic therapy. We'll considered Ted stockings

## 2014-01-24 NOTE — Telephone Encounter (Signed)
Patient Information:  Caller Name: [name]  Phone: [phone]  Patient: Mary, Guzman  Gender: Female  DOB: 1921/04/20  Age: 80 Years  PCP: Bluford Kaufmann (Family Practice > 80yrs old)  Office Follow Up:  Does the office need to follow up with this patient?: No  Instructions For The Office: N/A   Symptoms  Reason For Call & Symptoms: Today, 01/24/2014 , Daughter calling stating pt had OV  01/05/2014   for swelling  in legs with red discoloration for  ~ 1-2 weeks.  Pt had doppler and no evidence of blood clots. Pt was prescribed  Ceftin with diagnosis of early cellulitis  finishing ~ 01/12/2014 .  Legs are still swollen , and also still reddish , brown  colored,  not as bright red . Pain has improved ,  not having as much as before. RN called pt and conferenced for triage and c/o of swelling extending to below her knees not improving  No fever, NO pain.  Reviewed Health History In EMR: Yes  Reviewed Medications In EMR: Yes  Reviewed Allergies In EMR: Yes  Reviewed Surgeries / Procedures: Yes  Date of Onset of Symptoms: 12/15/2013  Treatments Tried: Elevation  Treatments Tried Worked: No  Guideline(s) Used:  Leg Swelling and Edema  Disposition Per Guideline:   See Today in Office  Reason For Disposition Reached:   Moderate swelling of both ankles (e.g., swelling extends up to the knees) AND new onset or worsening  Advice Given:  Varicose Veins - Treatment:  Try to rest and elevate your legs above your heart a couple times each day for 15 minutes.  Patient Will Follow Care Advice:  YES  Appointment Scheduled:  01/24/2014 15:15:00 Appointment Scheduled Provider:  Bluford Kaufmann (Family Practice > 41yrs old)

## 2014-01-24 NOTE — Telephone Encounter (Signed)
Noted  

## 2014-03-22 ENCOUNTER — Other Ambulatory Visit: Payer: Self-pay | Admitting: *Deleted

## 2014-03-22 MED ORDER — LEVOTHYROXINE SODIUM 88 MCG PO TABS: 88.0000 ug | ORAL_TABLET | Freq: Every day | ORAL | Status: DC

## 2014-07-04 ENCOUNTER — Encounter: Payer: Self-pay | Admitting: Internal Medicine

## 2014-07-04 ENCOUNTER — Other Ambulatory Visit: Payer: Self-pay | Admitting: Internal Medicine

## 2014-07-04 ENCOUNTER — Ambulatory Visit (INDEPENDENT_AMBULATORY_CARE_PROVIDER_SITE_OTHER): Payer: Medicare Other | Admitting: Internal Medicine

## 2014-07-04 VITALS — BP 136/80 | HR 68 | Temp 98.3°F | Resp 18 | Ht 63.0 in | Wt 137.0 lb

## 2014-07-04 DIAGNOSIS — E039 Hypothyroidism, unspecified: Secondary | ICD-10-CM

## 2014-07-04 DIAGNOSIS — I10 Essential (primary) hypertension: Secondary | ICD-10-CM

## 2014-07-04 DIAGNOSIS — Z23 Encounter for immunization: Secondary | ICD-10-CM

## 2014-07-04 DIAGNOSIS — M810 Age-related osteoporosis without current pathological fracture: Secondary | ICD-10-CM

## 2014-07-04 DIAGNOSIS — M159 Polyosteoarthritis, unspecified: Secondary | ICD-10-CM

## 2014-07-04 DIAGNOSIS — K219 Gastro-esophageal reflux disease without esophagitis: Secondary | ICD-10-CM

## 2014-07-04 DIAGNOSIS — M150 Primary generalized (osteo)arthritis: Secondary | ICD-10-CM

## 2014-07-04 MED ORDER — RALOXIFENE HCL 60 MG PO TABS: 60.0000 mg | ORAL_TABLET | Freq: Every day | ORAL | Status: DC

## 2014-07-04 MED ORDER — LEVOTHYROXINE SODIUM 88 MCG PO TABS: 88.0000 ug | ORAL_TABLET | Freq: Every day | ORAL | Status: DC

## 2014-07-04 MED ORDER — OMEPRAZOLE 20 MG PO CPDR: 20.0000 mg | DELAYED_RELEASE_CAPSULE | Freq: Every day | ORAL | Status: DC

## 2014-07-04 MED ORDER — FUROSEMIDE 20 MG PO TABS: 20.0000 mg | ORAL_TABLET | Freq: Every day | ORAL | Status: DC

## 2014-07-04 NOTE — Patient Instructions (Signed)
Limit your sodium (Salt) intake  Return in 6 months for follow-up  

## 2014-07-04 NOTE — Progress Notes (Signed)
Pre visit review using our clinic review tool, if applicable. No additional management support is needed unless otherwise documented below in the visit note. 

## 2014-07-04 NOTE — Progress Notes (Signed)
Subjective:    Patient ID: Mary Guzman, female    DOB: 1920/10/20, 80 y.o.   MRN: [id number]  HPI  80 year old patient who is seen today for followup.  She has a history of hypothyroidism, mild hypertension, and gastroesophageal reflux disease.  Doing quite well.  She has chronic venous insufficiency, which has been stable.  She has been off low-dose furosemide for about one month and has manageable edema.  No new concerns or complaints  Past Medical History  Diagnosis Date  . GERD (gastroesophageal reflux disease)   . Thyroid disease     Hypothyroidism  . Osteoporosis   . DJD (degenerative joint disease)   . 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  . Hypertension     History   Social History  . Marital Status: Single    Spouse Name: N/A    Number of Children: N/A  . Years of Education: N/A   Occupational History  . Not on file.   Social History Main Topics  . Smoking status: Never Smoker   . Smokeless tobacco: Current User    Types: Chew  . Alcohol Use: No  . Drug Use: Not on file  . Sexual Activity: Not on file   Other Topics Concern  . Not on file   Social History Narrative   Fairly active interaction with grandchildren.    Past Surgical History  Procedure Laterality Date  . Mastectomy  1994  . Ankle surgery      Right  . Intracapsular cataract extraction      Family History  Problem Relation Age of Onset  . Alcohol abuse Brother   . Asthma Son     No Known Allergies  Current Outpatient Prescriptions on File Prior to Visit  Medication Sig Dispense Refill  . aspirin 81 MG EC tablet Take 81 mg by mouth daily.        . Calcium Carbonate (CALCIUM 500 PO) Take 1 tablet by mouth daily.        . Cholecalciferol (VITAMIN D3) 5000 UNITS CAPS Take 1 capsule by mouth daily.      . furosemide (LASIX) 20 MG tablet Take 1 tablet (20 mg total) by mouth daily.  30 tablet  3  . lansoprazole (PREVACID) 30  MG capsule Take 1 capsule (30 mg total) by mouth daily.  90 capsule  3  . levothyroxine (SYNTHROID, LEVOTHROID) 88 MCG tablet Take 1 tablet (88 mcg total) by mouth daily.  90 tablet  3  . Multiple Vitamins-Minerals (PRESERVISION AREDS 2 PO) Take 2 tablets by mouth 2 (two) times daily.      . raloxifene (EVISTA) 60 MG tablet Take 1 tablet (60 mg total) by mouth daily.  90 tablet  3   No current facility-administered medications on file prior to visit.    BP 136/80  Pulse 68  Temp(Src) 98.3 F (36.8 C) (Oral)  Resp 18  Ht 5\' 3"  (1.6 m)  Wt 137 lb (62.143 kg)  BMI 24.27 kg/m2     Review of Systems  Constitutional: Negative.   HENT: Negative for congestion, dental problem, hearing loss, rhinorrhea, sinus pressure, sore throat and tinnitus.   Eyes: Negative for pain, discharge and visual disturbance.  Respiratory: Negative for cough and shortness of breath.   Cardiovascular: Positive for leg swelling. Negative for chest pain and palpitations.  Gastrointestinal: Negative for nausea, vomiting, abdominal pain, diarrhea, constipation, blood in stool  and abdominal distention.  Genitourinary: Negative for dysuria, urgency, frequency, hematuria, flank pain, vaginal bleeding, vaginal discharge, difficulty urinating, vaginal pain and pelvic pain.  Musculoskeletal: Negative for arthralgias, gait problem and joint swelling.  Skin: Negative for rash.  Neurological: Negative for dizziness, syncope, speech difficulty, weakness, numbness and headaches.  Hematological: Negative for adenopathy.  Psychiatric/Behavioral: Negative for behavioral problems, dysphoric mood and agitation. The patient is not nervous/anxious.        Objective:   Physical Exam  Constitutional: She is oriented to person, place, and time. She appears well-developed and well-nourished.  HENT:  Head: Normocephalic.  Right Ear: External ear normal.  Left Ear: External ear normal.  Mouth/Throat: Oropharynx is clear and moist.    Eyes: Conjunctivae and EOM are normal. Pupils are equal, round, and reactive to light.  Neck: Normal range of motion. Neck supple. No thyromegaly present.  Cardiovascular: Normal rate, regular rhythm, normal heart sounds and intact distal pulses.   Pulmonary/Chest: Effort normal and breath sounds normal.  Abdominal: Soft. Bowel sounds are normal. She exhibits no mass. There is no tenderness.  Musculoskeletal: Normal range of motion. She exhibits edema.  Mild lower extremity edema, right greater than left, with some stasis changes  Lymphadenopathy:    She has no cervical adenopathy.  Neurological: She is alert and oriented to person, place, and time.  Skin: Skin is warm and dry. No rash noted.  Psychiatric: She has a normal mood and affect. Her behavior is normal.          Assessment & Plan:   Chronic venous insufficiency.  Continue furosemide when necessary Essential hypertension, stable Osteoporosis Chronic kidney disease  CPX 6 months

## 2014-07-13 ENCOUNTER — Other Ambulatory Visit: Payer: Self-pay

## 2014-07-13 DIAGNOSIS — Z1231 Encounter for screening mammogram for malignant neoplasm of breast: Secondary | ICD-10-CM

## 2014-07-18 ENCOUNTER — Encounter: Payer: Self-pay | Admitting: Internal Medicine

## 2014-07-27 ENCOUNTER — Telehealth: Payer: Self-pay | Admitting: Hematology and Oncology

## 2014-07-27 ENCOUNTER — Ambulatory Visit (HOSPITAL_BASED_OUTPATIENT_CLINIC_OR_DEPARTMENT_OTHER): Payer: Medicare Other | Admitting: Hematology and Oncology

## 2014-07-27 ENCOUNTER — Encounter: Payer: Self-pay | Admitting: Hematology and Oncology

## 2014-07-27 VITALS — BP 174/71 | HR 69 | Temp 98.4°F | Resp 18 | Ht 63.0 in | Wt 134.8 lb

## 2014-07-27 DIAGNOSIS — Z853 Personal history of malignant neoplasm of breast: Secondary | ICD-10-CM

## 2014-07-27 DIAGNOSIS — R600 Localized edema: Secondary | ICD-10-CM

## 2014-07-27 DIAGNOSIS — M810 Age-related osteoporosis without current pathological fracture: Secondary | ICD-10-CM

## 2014-07-27 NOTE — Telephone Encounter (Signed)
Pt confirmed labs/ov per 11/11 POF, gave pt AVS..... KJ °

## 2014-07-27 NOTE — Assessment & Plan Note (Signed)
This is due to mild fluid retention.  She is currently taking Lasix.

## 2014-07-27 NOTE — Progress Notes (Signed)
[facility] OFFICE PROGRESS NOTE  Patient Care Team: [name], MD as PCP - General  SUMMARY OF ONCOLOGIC HISTORY: I have reviewed her chart extensively. She has a background history of left breast cancer, moderately differentiated, sized unknown but with involvement of 3/8 lymph nodes, ER positive, PR negative, HER-2/neu unknown. The patient underwent left breast modified radical mastectomy in 1994, followed by radiation therapy. She was placed on tamoxifen for 5 years. Most recently she is on Evista.  INTERVAL HISTORY: Please see below for problem oriented charting. She denies any recent abnormal breast examination, palpable mass, abnormal breast appearance or nipple changes   REVIEW OF SYSTEMS:   Constitutional: Denies fevers, chills  Eyes: Denies blurriness of vision Ears, nose, mouth, throat, and face: Denies mucositis or sore throat Respiratory: Denies cough, dyspnea or wheezes Cardiovascular: Denies palpitation, chest discomfort or lower extremity swelling Gastrointestinal:  Denies nausea, heartburn or change in bowel habits Skin: Denies abnormal skin rashes Lymphatics: Denies new lymphadenopathy or easy bruising Neurological:Denies numbness, tingling or new weaknesses Behavioral/Psych: Mood is stable, no new changes  All other systems were reviewed with the patient and are negative.  I have reviewed the past medical history, past surgical history, social history and family history with the patient and they are unchanged from previous note.  ALLERGIES:  has No Known Allergies.  MEDICATIONS:  Current Outpatient Prescriptions  Medication Sig Dispense Refill  . aspirin 81 MG EC tablet Take 81 mg by mouth daily.      . Calcium Carbonate (CALCIUM 500 PO) Take 1 tablet by mouth daily.      . Cholecalciferol (VITAMIN D3) 5000 UNITS CAPS Take 1 capsule by mouth daily.    . furosemide (LASIX) 20 MG tablet Take 1 tablet (20 mg total) by mouth daily. 30 tablet  3  . levothyroxine (SYNTHROID, LEVOTHROID) 88 MCG tablet Take 1 tablet (88 mcg total) by mouth daily. 90 tablet 3  . Multiple Vitamins-Minerals (PRESERVISION AREDS 2 PO) Take 2 tablets by mouth 2 (two) times daily.    Marland Kitchen omeprazole (PRILOSEC) 20 MG capsule Take 1 capsule (20 mg total) by mouth daily. 90 capsule 3  . raloxifene (EVISTA) 60 MG tablet Take 1 tablet (60 mg total) by mouth daily. 90 tablet 3   No current facility-administered medications for this visit.    PHYSICAL EXAMINATION: ECOG PERFORMANCE STATUS: 1 - Symptomatic but completely ambulatory  Filed Vitals:   07/27/14 1201  BP: 174/71  Pulse: 69  Temp: 98.4 F (36.9 C)  Resp: 18   Filed Weights   07/27/14 1201  Weight: 134 lb 12.8 oz (61.145 kg)    GENERAL:alert, no distress and comfortable. She looks thin SKIN: skin color, texture, turgor are normal, no rashes or significant lesions EYES: normal, Conjunctiva are pink and non-injected, sclera clear OROPHARYNX:no exudate, no erythema and lips, buccal mucosa, and tongue normal  NECK: supple, thyroid normal size, non-tender, without nodularity LYMPH:  no palpable lymphadenopathy in the cervical, axillary or inguinal LUNGS: clear to auscultation and percussion with normal breathing effort HEART: regular rate & rhythm and no murmurs with mild bilateral lower extremity edema ABDOMEN:abdomen soft, non-tender and normal bowel sounds Musculoskeletal:no cyanosis of digits and no clubbing  NEURO: alert & oriented x 3 with fluent speech, no focal motor/sensory deficits  Well-healed mastectomy scar on the left. Normal breast exam on the right. LABORATORY DATA:  I have reviewed the data as listed    Component Value Date/Time   NA 140 11/09/2013  0920   NA 140 06/02/2013 1347   K 4.8 11/09/2013 0920   K 5.8* 06/02/2013 1347   CL 109 11/09/2013 0920   CL 104 05/14/2012 1522   CO2 24 11/09/2013 0920   CO2 20* 06/02/2013 1347   GLUCOSE 79 11/09/2013 0920   GLUCOSE 97  06/02/2013 1347   GLUCOSE 91 05/14/2012 1522   BUN 32* 11/09/2013 0920   BUN 50.1* 06/02/2013 1347   CREATININE 1.8* 11/09/2013 0920   CREATININE 2.3* 06/02/2013 1347   CALCIUM 8.7 11/09/2013 0920   CALCIUM 8.8 06/02/2013 1347   PROT 6.5 11/09/2013 0920   PROT 6.9 06/02/2013 1347   ALBUMIN 3.7 11/09/2013 0920   ALBUMIN 3.6 06/02/2013 1347   AST 32 11/09/2013 0920   AST 26 06/02/2013 1347   ALT 16 11/09/2013 0920   ALT 14 06/02/2013 1347   ALKPHOS 48 11/09/2013 0920   ALKPHOS 54 06/02/2013 1347   BILITOT 0.5 11/09/2013 0920   BILITOT 0.23 06/02/2013 1347   GFRNONAA 45.29 12/06/2008 1026   GFRAA 46 12/08/2007 0959    No results found for: SPEP, UPEP  Lab Results  Component Value Date   WBC 5.8 11/09/2013   NEUTROABS 3.8 11/09/2013   HGB 10.3* 11/09/2013   HCT 31.7* 11/09/2013   MCV 87.0 11/09/2013   PLT 236.0 11/09/2013      Chemistry      Component Value Date/Time   NA 140 11/09/2013 0920   NA 140 06/02/2013 1347   K 4.8 11/09/2013 0920   K 5.8* 06/02/2013 1347   CL 109 11/09/2013 0920   CL 104 05/14/2012 1522   CO2 24 11/09/2013 0920   CO2 20* 06/02/2013 1347   BUN 32* 11/09/2013 0920   BUN 50.1* 06/02/2013 1347   CREATININE 1.8* 11/09/2013 0920   CREATININE 2.3* 06/02/2013 1347      Component Value Date/Time   CALCIUM 8.7 11/09/2013 0920   CALCIUM 8.8 06/02/2013 1347   ALKPHOS 48 11/09/2013 0920   ALKPHOS 54 06/02/2013 1347   AST 32 11/09/2013 0920   AST 26 06/02/2013 1347   ALT 16 11/09/2013 0920   ALT 14 06/02/2013 1347   BILITOT 0.5 11/09/2013 0920   BILITOT 0.23 06/02/2013 1347      ASSESSMENT & PLAN:  BREAST CANCER, HX OF Clinically, she has no evidence of disease. I recommend consideration to discontinue mammogram screening.  Osteoporosis She is currently on Evista, calcium with vitamin D. I would defer to her primary care doctor for further management.  Bilateral leg edema This is due to mild fluid retention.  She is currently taking  Lasix.     All questions were answered. The patient knows to call the clinic with any problems, questions or concerns. No barriers to learning was detected. I spent 15 minutes counseling the patient face to face. The total time spent in the appointment was 20 minutes and more than 50% was on counseling and review of test results     [facility], [place], MD 07/27/2014 2:52 PM

## 2014-07-27 NOTE — Assessment & Plan Note (Signed)
Clinically, she has no evidence of disease. I recommend consideration to discontinue mammogram screening.

## 2014-07-27 NOTE — Assessment & Plan Note (Signed)
She is currently on Evista, calcium with vitamin D. I would defer to her primary care doctor for further management.

## 2014-08-03 ENCOUNTER — Ambulatory Visit: Payer: Medicare Other | Admitting: Hematology and Oncology

## 2014-08-09 ENCOUNTER — Ambulatory Visit
Admission: RE | Admit: 2014-08-09 | Discharge: 2014-08-09 | Disposition: A | Discharge status: Home / Self Care | Payer: Medicare Other | Source: Ambulatory Visit

## 2014-08-09 DIAGNOSIS — Z1231 Encounter for screening mammogram for malignant neoplasm of breast: Secondary | ICD-10-CM

## 2014-11-10 ENCOUNTER — Ambulatory Visit (INDEPENDENT_AMBULATORY_CARE_PROVIDER_SITE_OTHER): Payer: Medicare Other | Admitting: Internal Medicine

## 2014-11-10 ENCOUNTER — Encounter: Payer: Self-pay | Admitting: Internal Medicine

## 2014-11-10 VITALS — BP 122/70 | HR 70 | Temp 98.1°F | Resp 18 | Ht 62.0 in | Wt 136.0 lb

## 2014-11-10 DIAGNOSIS — Z0000 Encounter for general adult medical examination without abnormal findings: Secondary | ICD-10-CM

## 2014-11-10 DIAGNOSIS — M810 Age-related osteoporosis without current pathological fracture: Secondary | ICD-10-CM

## 2014-11-10 DIAGNOSIS — I10 Essential (primary) hypertension: Secondary | ICD-10-CM

## 2014-11-10 DIAGNOSIS — N183 Chronic kidney disease, stage 3 unspecified: Secondary | ICD-10-CM

## 2014-11-10 DIAGNOSIS — M150 Primary generalized (osteo)arthritis: Secondary | ICD-10-CM

## 2014-11-10 DIAGNOSIS — E039 Hypothyroidism, unspecified: Secondary | ICD-10-CM

## 2014-11-10 DIAGNOSIS — M159 Polyosteoarthritis, unspecified: Secondary | ICD-10-CM

## 2014-11-10 DIAGNOSIS — Z853 Personal history of malignant neoplasm of breast: Secondary | ICD-10-CM

## 2014-11-10 DIAGNOSIS — N182 Chronic kidney disease, stage 2 (mild): Secondary | ICD-10-CM

## 2014-11-10 LAB — COMPREHENSIVE METABOLIC PANEL
ALT: 11 U/L (ref 0–35)
AST: 22 U/L (ref 0–37)
Albumin: 3.6 g/dL (ref 3.5–5.2)
Alkaline Phosphatase: 74 U/L (ref 39–117)
BILIRUBIN TOTAL: 0.3 mg/dL (ref 0.2–1.2)
BUN: 42 mg/dL — ABNORMAL HIGH (ref 6–23)
CO2: 24 mEq/L (ref 19–32)
CREATININE: 1.84 mg/dL — AB (ref 0.40–1.20)
Calcium: 8.8 mg/dL (ref 8.4–10.5)
Chloride: 110 mEq/L (ref 96–112)
GFR: 27.28 mL/min — ABNORMAL LOW (ref >60.00)
Glucose, Bld: 90 mg/dL (ref 70–99)
Potassium: 4.9 mEq/L (ref 3.5–5.1)
SODIUM: 139 meq/L (ref 135–145)
TOTAL PROTEIN: 6.6 g/dL (ref 6.0–8.3)

## 2014-11-10 LAB — TSH: TSH: 1.3 u[IU]/mL (ref 0.35–4.50)

## 2014-11-10 LAB — CBC WITH DIFFERENTIAL/PLATELET
BASOS PCT: 0.9 % (ref 0.0–3.0)
Basophils Absolute: 0.1 10*3/uL (ref 0.0–0.1)
Eosinophils Absolute: 0.3 10*3/uL (ref 0.0–0.7)
Eosinophils Relative: 5.8 % — ABNORMAL HIGH (ref 0.0–5.0)
HEMATOCRIT: 29.1 % — AB (ref 36.0–46.0)
HEMOGLOBIN: 9.4 g/dL — AB (ref 12.0–15.0)
Lymphocytes Relative: 21.7 % (ref 12.0–46.0)
Lymphs Abs: 1.3 10*3/uL (ref 0.7–4.0)
MCHC: 32.4 g/dL (ref 30.0–36.0)
MCV: 78.2 fl (ref 78.0–100.0)
MONOS PCT: 7.3 % (ref 3.0–12.0)
Monocytes Absolute: 0.4 10*3/uL (ref 0.1–1.0)
NEUTROS ABS: 3.8 10*3/uL (ref 1.4–7.7)
Neutrophils Relative %: 64.3 % (ref 43.0–77.0)
Platelets: 363 10*3/uL (ref 150.0–400.0)
RBC: 3.72 Mil/uL — ABNORMAL LOW (ref 3.87–5.11)
RDW: 15.4 % (ref 11.5–15.5)
WBC: 5.9 10*3/uL (ref 4.0–10.5)

## 2014-11-10 MED ORDER — FUROSEMIDE 20 MG PO TABS: 20.0000 mg | ORAL_TABLET | Freq: Every day | ORAL | Status: DC

## 2014-11-10 NOTE — Patient Instructions (Signed)
Limit your sodium (Salt) intake  Please check your blood pressure on a regular basis.  If it is consistently greater than 150/90, please make an office appointment.  Avoids foods high in acid such as tomatoes citrus juices, and spicy foods.  Avoid eating within two hours of lying down or before exercising.  Do not overheat.  Try smaller more frequent meals.  If symptoms persist, elevate the head of her bed 12 inches while sleeping.  Health Maintenance Adopting a healthy lifestyle and getting preventive care can go a long way to promote health and wellness. Talk with your health care provider about what schedule of regular examinations is right for you. This is a good chance for you to check in with your provider about disease prevention and staying healthy. In between checkups, there are plenty of things you can do on your own. Experts have done a lot of research about which lifestyle changes and preventive measures are most likely to keep you healthy. Ask your health care provider for more information. WEIGHT AND DIET  Eat a healthy diet  Be sure to include plenty of vegetables, fruits, low-fat dairy products, and lean protein.  Do not eat a lot of foods high in solid fats, added sugars, or salt.  Get regular exercise. This is one of the most important things you can do for your health.  Most adults should exercise for at least 150 minutes each week. The exercise should increase your heart rate and make you sweat (moderate-intensity exercise).  Most adults should also do strengthening exercises at least twice a week. This is in addition to the moderate-intensity exercise.  Maintain a healthy weight  Body mass index (BMI) is a measurement that can be used to identify possible weight problems. It estimates body fat based on height and weight. Your health care provider can help determine your BMI and help you achieve or maintain a healthy weight.  For females 79 years of age and older:   A  BMI below 18.5 is considered underweight.  A BMI of 18.5 to 24.9 is normal.  A BMI of 25 to 29.9 is considered overweight.  A BMI of 30 and above is considered obese.  Watch levels of cholesterol and blood lipids  You should start having your blood tested for lipids and cholesterol at 79 years of age, then have this test every 5 years.  You may need to have your cholesterol levels checked more often if:  Your lipid or cholesterol levels are high.  You are older than 79 years of age.  You are at high risk for heart disease.  CANCER SCREENING   Lung Cancer  Lung cancer screening is recommended for adults 79-43 years old who are at high risk for lung cancer because of a history of smoking.  A yearly low-dose CT scan of the lungs is recommended for people who:  Currently smoke.  Have quit within the past 15 years.  Have at least a 30-pack-year history of smoking. A pack year is smoking an average of one pack of cigarettes a day for 1 year.  Yearly screening should continue until it has been 15 years since you quit.  Yearly screening should stop if you develop a health problem that would prevent you from having lung cancer treatment.  Breast Cancer  Practice breast self-awareness. This means understanding how your breasts normally appear and feel.  It also means doing regular breast self-exams. Let your health care provider know about any changes, no  matter how small.  If you are in your 79s or 30s, you should have a clinical breast exam (CBE) by a health care provider every 1-3 years as part of a regular health exam.  If you are 68 or older, have a CBE every year. Also consider having a breast X-ray (mammogram) every year.  If you have a family history of breast cancer, talk to your health care provider about genetic screening.  If you are at high risk for breast cancer, talk to your health care provider about having an MRI and a mammogram every year.  Breast cancer  gene (BRCA) assessment is recommended for women who have family members with BRCA-related cancers. BRCA-related cancers include:  Breast.  Ovarian.  Tubal.  Peritoneal cancers.  Results of the assessment will determine the need for genetic counseling and BRCA1 and BRCA2 testing. Cervical Cancer Routine pelvic examinations to screen for cervical cancer are no longer recommended for nonpregnant women who are considered low risk for cancer of the pelvic organs (ovaries, uterus, and vagina) and who do not have symptoms. A pelvic examination may be necessary if you have symptoms including those associated with pelvic infections. Ask your health care provider if a screening pelvic exam is right for you.   The Pap test is the screening test for cervical cancer for women who are considered at risk.  If you had a hysterectomy for a problem that was not cancer or a condition that could lead to cancer, then you no longer need Pap tests.  If you are older than 65 years, and you have had normal Pap tests for the past 10 years, you no longer need to have Pap tests.  If you have had past treatment for cervical cancer or a condition that could lead to cancer, you need Pap tests and screening for cancer for at least 20 years after your treatment.  If you no longer get a Pap test, assess your risk factors if they change (such as having a new sexual partner). This can affect whether you should start being screened again.  Some women have medical problems that increase their chance of getting cervical cancer. If this is the case for you, your health care provider may recommend more frequent screening and Pap tests.  The human papillomavirus (HPV) test is another test that may be used for cervical cancer screening. The HPV test looks for the virus that can cause cell changes in the cervix. The cells collected during the Pap test can be tested for HPV.  The HPV test can be used to screen women 10 years of age  and older. Getting tested for HPV can extend the interval between normal Pap tests from three to five years.  An HPV test also should be used to screen women of any age who have unclear Pap test results.  After 79 years of age, women should have HPV testing as often as Pap tests.  Colorectal Cancer  This type of cancer can be detected and often prevented.  Routine colorectal cancer screening usually begins at 79 years of age and continues through 79 years of age.  Your health care provider may recommend screening at an earlier age if you have risk factors for colon cancer.  Your health care provider may also recommend using home test kits to check for hidden blood in the stool.  A small camera at the end of a tube can be used to examine your colon directly (sigmoidoscopy or colonoscopy). This is  done to check for the earliest forms of colorectal cancer.  Routine screening usually begins at age 50.  Direct examination of the colon should be repeated every 5-10 years through 79 years of age. However, you may need to be screened more often if early forms of precancerous polyps or small growths are found. Skin Cancer  Check your skin from head to toe regularly.  Tell your health care provider about any new moles or changes in moles, especially if there is a change in a mole's shape or color.  Also tell your health care provider if you have a mole that is larger than the size of a pencil eraser.  Always use sunscreen. Apply sunscreen liberally and repeatedly throughout the day.  Protect yourself by wearing long sleeves, pants, a wide-brimmed hat, and sunglasses whenever you are outside. HEART DISEASE, DIABETES, AND HIGH BLOOD PRESSURE   Have your blood pressure checked at least every 1-2 years. High blood pressure causes heart disease and increases the risk of stroke.  If you are between 55 years and 79 years old, ask your health care provider if you should take aspirin to prevent  strokes.  Have regular diabetes screenings. This involves taking a blood sample to check your fasting blood sugar level.  If you are at a normal weight and have a low risk for diabetes, have this test once every three years after 79 years of age.  If you are overweight and have a high risk for diabetes, consider being tested at a younger age or more often. PREVENTING INFECTION  Hepatitis B  If you have a higher risk for hepatitis B, you should be screened for this virus. You are considered at high risk for hepatitis B if:  You were born in a country where hepatitis B is common. Ask your health care provider which countries are considered high risk.  Your parents were born in a high-risk country, and you have not been immunized against hepatitis B (hepatitis B vaccine).  You have HIV or AIDS.  You use needles to inject street drugs.  You live with someone who has hepatitis B.  You have had sex with someone who has hepatitis B.  You get hemodialysis treatment.  You take certain medicines for conditions, including cancer, organ transplantation, and autoimmune conditions. Hepatitis C  Blood testing is recommended for:  Everyone born from 1945 through 1965.  Anyone with known risk factors for hepatitis C. Sexually transmitted infections (STIs)  You should be screened for sexually transmitted infections (STIs) including gonorrhea and chlamydia if:  You are sexually active and are younger than 79 years of age.  You are older than 79 years of age and your health care provider tells you that you are at risk for this type of infection.  Your sexual activity has changed since you were last screened and you are at an increased risk for chlamydia or gonorrhea. Ask your health care provider if you are at risk.  If you do not have HIV, but are at risk, it may be recommended that you take a prescription medicine daily to prevent HIV infection. This is called pre-exposure prophylaxis  (PrEP). You are considered at risk if:  You are sexually active and do not regularly use condoms or know the HIV status of your partner(s).  You take drugs by injection.  You are sexually active with a partner who has HIV. Talk with your health care provider about whether you are at high risk of being infected   with HIV. If you choose to begin PrEP, you should first be tested for HIV. You should then be tested every 3 months for as long as you are taking PrEP.  PREGNANCY   If you are premenopausal and you may become pregnant, ask your health care provider about preconception counseling.  If you may become pregnant, take 400 to 800 micrograms (mcg) of folic acid every day.  If you want to prevent pregnancy, talk to your health care provider about birth control (contraception). OSTEOPOROSIS AND MENOPAUSE   Osteoporosis is a disease in which the bones lose minerals and strength with aging. This can result in serious bone fractures. Your risk for osteoporosis can be identified using a bone density scan.  If you are 65 years of age or older, or if you are at risk for osteoporosis and fractures, ask your health care provider if you should be screened.  Ask your health care provider whether you should take a calcium or vitamin D supplement to lower your risk for osteoporosis.  Menopause may have certain physical symptoms and risks.  Hormone replacement therapy may reduce some of these symptoms and risks. Talk to your health care provider about whether hormone replacement therapy is right for you.  HOME CARE INSTRUCTIONS   Schedule regular health, dental, and eye exams.  Stay current with your immunizations.   Do not use any tobacco products including cigarettes, chewing tobacco, or electronic cigarettes.  If you are pregnant, do not drink alcohol.  If you are breastfeeding, limit how much and how often you drink alcohol.  Limit alcohol intake to no more than 1 drink per day for  nonpregnant women. One drink equals 12 ounces of beer, 5 ounces of wine, or 1 ounces of hard liquor.  Do not use street drugs.  Do not share needles.  Ask your health care provider for help if you need support or information about quitting drugs.  Tell your health care provider if you often feel depressed.  Tell your health care provider if you have ever been abused or do not feel safe at home. Document Released: 03/18/2011 Document Revised: 01/17/2014 Document Reviewed: 08/04/2013 [employer] Patient Information 2015 [employer]. This information is not intended to replace advice given to you by your health care provider. Make sure you discuss any questions you have with your health care provider.  

## 2014-11-10 NOTE — Progress Notes (Signed)
Patient ID: Mary Guzman, female   DOB: 1921/02/25, 81 y.o.   MRN: [id number]  Subjective:    Patient ID: Mary Guzman, female    DOB: 10-Nov-1920, 81 y.o.   MRN: [id number]  HPI 81 year-old patient who is seen today for her annual followup.  She has a history of treated hypothyroidism as well as treated hypertension. She has done quite well. She has a history of  breast cancer and osteoporosis. She is on calcium and vitamin D supplements. She has a history of gastroesophageal reflux disease which has been stable. She has done well without any new concerns or complaints.   Past Medical History  Diagnosis Date  . GERD (gastroesophageal reflux disease)   . Thyroid disease     Hypothyroidism  . Osteoporosis   . DJD (degenerative joint disease)   . 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  . Hypertension     History   Social History  . Marital Status: Single    Spouse Name: N/A  . Number of Children: N/A  . Years of Education: N/A   Occupational History  . Not on file.   Social History Main Topics  . Smoking status: Never Smoker   . Smokeless tobacco: Current User    Types: Chew  . Alcohol Use: No  . Drug Use: Not on file  . Sexual Activity: Not on file   Other Topics Concern  . Not on file   Social History Narrative   Fairly active interaction with grandchildren.    Past Surgical History  Procedure Laterality Date  . Mastectomy  1994  . Ankle surgery      Right  . Intracapsular cataract extraction      Family History  Problem Relation Age of Onset  . Alcohol abuse Brother   . Asthma Son     No Known Allergies  Current Outpatient Prescriptions on File Prior to Visit  Medication Sig Dispense Refill  . aspirin 81 MG EC tablet Take 81 mg by mouth daily.      . Calcium Carbonate (CALCIUM 500 PO) Take 1 tablet by mouth daily.      . Cholecalciferol (VITAMIN D3) 5000 UNITS CAPS Take 1 capsule by mouth daily.     . furosemide (LASIX) 20 MG tablet Take 1 tablet (20 mg total) by mouth daily. 30 tablet 3  . levothyroxine (SYNTHROID, LEVOTHROID) 88 MCG tablet Take 1 tablet (88 mcg total) by mouth daily. 90 tablet 3  . Multiple Vitamins-Minerals (PRESERVISION AREDS 2 PO) Take 2 tablets by mouth 2 (two) times daily.    Marland Kitchen omeprazole (PRILOSEC) 20 MG capsule Take 1 capsule (20 mg total) by mouth daily. 90 capsule 3  . raloxifene (EVISTA) 60 MG tablet Take 1 tablet (60 mg total) by mouth daily. 90 tablet 3   No current facility-administered medications on file prior to visit.    BP 122/70 mmHg  Pulse 70  Temp(Src) 98.1 F (36.7 C) (Oral)  Resp 18  Ht 5\' 2"  (1.575 m)  Wt 136 lb (61.689 kg)  BMI 24.87 kg/m2  SpO2 95%   1. Risk factors, based on past  M,S,F history- cardio vascular factors include hypertension and age  83.  Physical activities: Fairly sedentary but does well for 81 years of age  58.  Depression/mood: No history of depression or mood disorder  4.  Hearing: Mild deficit only  5.  ADL's: Requires  assistance with aspects of daily living  6.  Fall risk: Moderate due to age and slight instability  7.  Home safety: No problems identified   8.  Height weight, and visual acuity; height and weight stable no change in visual acuity  9.  Counseling: Heart healthy diet encouraged  10. Lab orders based on risk factors: TSH and laboratory update reviewed  11. Referral : Not appropriate at this time  12. Care plan: Continue the aggressive blood pressure control  13. Cognitive assessment: Alert and oriented normal affect. No cognitive dysfunction  14.  Preventive services will include annual clinical exams with screening lab.  She will consider annual mammograms.  Oncology follow-up.  She is also followed by ophthalmology.  previously  15.  Provider list includes primary care ophthalmology oncology and radiology    Review of Systems  Constitutional: Negative.   HENT: Negative for  congestion, dental problem, hearing loss, rhinorrhea, sinus pressure, sore throat and tinnitus.   Eyes: Negative for pain, discharge and visual disturbance.  Respiratory: Negative for cough and shortness of breath.   Cardiovascular: Negative for chest pain, palpitations and leg swelling.  Gastrointestinal: Negative for nausea, vomiting, abdominal pain, diarrhea, constipation, blood in stool and abdominal distention.  Genitourinary: Negative for dysuria, urgency, frequency, hematuria, flank pain, vaginal bleeding, vaginal discharge, difficulty urinating, vaginal pain and pelvic pain.  Musculoskeletal: Positive for arthralgias. Negative for joint swelling and gait problem.          Skin: Negative for rash.  Neurological: Negative for dizziness, syncope, speech difficulty, weakness, numbness and headaches.  Hematological: Negative for adenopathy.  Psychiatric/Behavioral: Negative for behavioral problems, dysphoric mood and agitation. The patient is not nervous/anxious.        Objective:   Physical Exam  Constitutional: She is oriented to person, place, and time. She appears well-developed and well-nourished.  HENT:  Head: Normocephalic.  Right Ear: External ear normal.  Left Ear: External ear normal.  Mouth/Throat: Oropharynx is clear and moist.  Eyes: Conjunctivae and EOM are normal. Pupils are equal, round, and reactive to light.  Neck: Normal range of motion. Neck supple. No thyromegaly present.  Cardiovascular: Normal rate, regular rhythm and normal heart sounds.   Dorsalis pedis pulses full.  Posterior tibial pulses not easily palpable  Pulmonary/Chest: Effort normal and breath sounds normal.  Abdominal: Soft. Bowel sounds are normal. She exhibits no mass. There is no tenderness.  Genitourinary: Guaiac negative stool.  Musculoskeletal: Normal range of motion.  Osteoarthritic changes in the small joints  Trace ankle edema  Lymphadenopathy:    She has no cervical adenopathy.   Neurological: She is alert and oriented to person, place, and time.  Skin: Skin is warm and dry. No rash noted.  Psychiatric: She has a normal mood and affect. Her behavior is normal.          Assessment & Plan:  Preventive health examination Osteoporosis. We'll continue  calcium and vitamin D supplementation Hypertension. Well controlled we'll continue her present antihypertensive regimen. Hypothyroidism. We'll check a TSH. Gastroesophageal reflux disease stable;  we'll challenge off PPI therapy  We'll recheck in 12 months

## 2014-11-10 NOTE — Progress Notes (Signed)
Pre visit review using our clinic review tool, if applicable. No additional management support is needed unless otherwise documented below in the visit note. 

## 2014-11-22 ENCOUNTER — Ambulatory Visit: Payer: Medicare Other | Attending: Optometry | Admitting: Occupational Therapy

## 2014-11-22 DIAGNOSIS — H53419 Scotoma involving central area, unspecified eye: Secondary | ICD-10-CM

## 2014-11-22 NOTE — Therapy (Signed)
[facility] [address] [place], [place], [address] Phone: [phone]   Fax:  [phone]  Occupational Therapy Evaluation  Patient Details  Name: Mary Guzman MRN: [id number] Date of Birth: 10-24-1920 Referring Provider:  [name], MD  Encounter Date: 11/22/2014      OT End of Session - 11/22/14 1234    Visit Number 1   Number of Visits 1   Authorization Type [employer]   OT Start Time 1110   OT Stop Time 1205   OT Time Calculation (min) 55 min   Activity Tolerance Patient tolerated treatment well   Behavior During Therapy [facility] for tasks assessed/performed      Past Medical History  Diagnosis Date  . GERD (gastroesophageal reflux disease)   . Thyroid disease     Hypothyroidism  . Osteoporosis   . DJD (degenerative joint disease)   . 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  . Hypertension     Past Surgical History  Procedure Laterality Date  . Mastectomy  1994  . Ankle surgery      Right  . Intracapsular cataract extraction      There were no vitals taken for this visit.  Visit Diagnosis:  Scotoma involving central area, unspecified laterality      Subjective Assessment - 11/22/14 1112    Symptoms Pt's vision is blurry. Pt reports difficulty reading eye chart   Pertinent History macular degeneration    Patient Stated Goals read better with adaptive equipment    Currently in Pain? No/denies          [facility] OT Assessment - 11/23/14 0001    Assessment   Diagnosis macular degeneration   Onset Date --  several years   Assessment Pt reports increased blurriness of vision with increased difficulty reading.   Prior Therapy none   Precautions   Precautions Other (comment)  low vision   Balance Screen   Has the patient fallen in the past 6 months Yes   How many times? 1   Has the patient had a decrease in activity level  because of a fear of falling?  No   Is the patient reluctant to leave their home because of a fear of falling?  No   Home  Environment   Family/patient expects to be discharged to: Private residence   [place] One level   Bathroom Shower/Tub [place]   Lives With Alone   ADL   ADL comments modified independent with all basic ADLs  Pt performs cooking modified independently   Vision - History   Baseline Vision Bifocals   Visual History Macular degeneration   Additional Comments hx of  cataract surgery   Vision Assessment   Vision Assessment Vision tested   Per MD/OD Report OD: 20/100, OS: 20/25   Reading Acuity 20/63   Cognition   Overall Cognitive Status Within Functional Limits for tasks assessed   Mini Mental State Exam  26/30 WNLS         Treatment: Pt/daughter were instructed in use of 3x handheld magnifier and 3x stand magnifier. Pt demonstrated ability to read continuous text following instruction. Therapist made recommendations regarding hi- marks for stove/ microwave, AE for increased ease with check writing/ measuring ingredients. Education provided regarding macular degeneration and compensatory strategies for visual impairment. Pt was provided with an  [employer] catalog for purchase of AE.Pt verbalized understanding of all education.              OT Education - 10-Dec-2014 0915    Education provided Yes   Education Details Use of 3x stand and handheld magnifiers, macular degeneration and AE/strategies to compensate for visual deficits   Person(s) Educated Patient;Child(ren)   Methods Explanation;Demonstration  catalog to purchase AE/ magnfiers   Comprehension Verbalized understanding;Returned demonstration             OT Long Term Goals - 12/10/14 0910    OT LONG TERM GOAL #1   Title No goals were set as pt was seen for evaluation and treatment on day of eval.                Plan - 12/10/14 0917    Clinical Impression Statement Pt with macular degneration reports blurry vision and increased difficulty with reading. Pt can benefit from OT eval and treatment to educate pt. regarding AE and compensatory strategies for visual deficits.   Pt will benefit from skilled therapeutic intervention in order to improve on the following deficits (Retired) Impaired vision/preception   Rehab Potential Good   Clinical Impairments Affecting Rehab Potential vision   OT Frequency 1x / week   OT Duration 4 weeks   OT Treatment/Interventions Self-care/ADL training;DME and/or AE instruction;Patient/family education;Therapeutic exercise;Visual/perceptual remediation/compensation;Therapeutic activities   Plan Pt was seen for evaluation and treatment on day of eval. Pt demonstrates ability to read continuous text using 3x magnifiers. No additional OT recommended at this time.   Consulted and Agree with Plan of Care Patient;Family member/caregiver   Family Member Consulted daughter.          G-Codes - Dec 10, 2014 0911    Functional Assessment Tool Used difficulty reading   Functional Limitation Self care   Self Care Current Status [phone]) At least 1 percent but less than 20 percent impaired, limited or restricted   Self Care Goal Status [id number]) At least 1 percent but less than 20 percent impaired, limited or restricted   Self Care Discharge Status [phone]) At least 1 percent but less than 20 percent impaired, limited or restricted      Problem List Patient Active Problem List   Diagnosis Date Noted  . Bilateral leg edema 07/27/2014  . Chronic kidney disease 06/07/2013  . Essential hypertension 07/03/2009  . Hypothyroidism 03/16/2007  . GERD 03/16/2007  . Osteoarthritis 03/16/2007  . Osteoporosis 03/16/2007  . BREAST CANCER, HX OF 03/16/2007    RINE,KATHRYN 10-Dec-2014, 9:22 AM Theone Murdoch, OTR/L [phone] Phone: [phone] 9:22 AM  Dec 10, 2014 [facility] [address] [place], [place], [address] Phone: [phone]   Fax:  [phone]

## 2014-11-23 NOTE — Addendum Note (Signed)
Addended by: Theone Murdoch B on: 11/23/2014 09:26 AM   Modules accepted: Orders

## 2015-01-03 ENCOUNTER — Encounter: Payer: Medicare Other | Admitting: Internal Medicine

## 2015-01-10 ENCOUNTER — Ambulatory Visit (INDEPENDENT_AMBULATORY_CARE_PROVIDER_SITE_OTHER): Payer: Medicare Other | Admitting: Family Medicine

## 2015-01-10 ENCOUNTER — Encounter: Payer: Self-pay | Admitting: Family Medicine

## 2015-01-10 VITALS — BP 138/80 | HR 71 | Temp 98.1°F | Ht 62.0 in | Wt 143.7 lb

## 2015-01-10 DIAGNOSIS — M545 Low back pain: Secondary | ICD-10-CM

## 2015-01-10 NOTE — Progress Notes (Signed)
HPI:  R hip pain: -started two week ago - no trauma or fall recently -reports fall several months ago and reports no pain at that time and reports saw PCP for this -pain is sharp, intermittent, moderate pain - notices it when she lifts something heavy or when goes to stand up or walking sometimes, doesn't bother her when walking up steps -denies: radiation, weakness, numbness, bowel or bladder dysfunction, fevers, chills, weight loss  ROS: See pertinent positives and negatives per HPI.  Past Medical History  Diagnosis Date  . GERD (gastroesophageal reflux disease)   . Thyroid disease     Hypothyroidism  . Osteoporosis   . DJD (degenerative joint disease)   . 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  . Hypertension     Past Surgical History  Procedure Laterality Date  . Mastectomy  1994  . Ankle surgery      Right  . Intracapsular cataract extraction      Family History  Problem Relation Age of Onset  . Alcohol abuse Brother   . Asthma Son     History   Social History  . Marital Status: Single    Spouse Name: N/A  . Number of Children: N/A  . Years of Education: N/A   Social History Main Topics  . Smoking status: Never Smoker   . Smokeless tobacco: Current User    Types: Chew  . Alcohol Use: No  . Drug Use: Not on file  . Sexual Activity: Not on file   Other Topics Concern  . None   Social History Narrative   Fairly active interaction with grandchildren.     Current outpatient prescriptions:  .  aspirin 81 MG EC tablet, Take 81 mg by mouth daily.  , Disp: , Rfl:  .  Calcium Carbonate (CALCIUM 500 PO), Take 1 tablet by mouth daily.  , Disp: , Rfl:  .  Cholecalciferol (VITAMIN D3) 5000 UNITS CAPS, Take 1 capsule by mouth daily., Disp: , Rfl:  .  furosemide (LASIX) 20 MG tablet, Take 1 tablet (20 mg total) by mouth daily., Disp: 90 tablet, Rfl: 3 .  ibuprofen (ADVIL,MOTRIN) 200 MG tablet, Take 200 mg  by mouth as needed., Disp: , Rfl:  .  levothyroxine (SYNTHROID, LEVOTHROID) 88 MCG tablet, Take 1 tablet (88 mcg total) by mouth daily., Disp: 90 tablet, Rfl: 3 .  Multiple Vitamins-Minerals (PRESERVISION AREDS 2 PO), Take 2 tablets by mouth 2 (two) times daily., Disp: , Rfl:  .  omeprazole (PRILOSEC) 20 MG capsule, Take 20 mg by mouth daily as needed., Disp: , Rfl:  .  raloxifene (EVISTA) 60 MG tablet, Take 1 tablet (60 mg total) by mouth daily., Disp: 90 tablet, Rfl: 3  EXAM:  Filed Vitals:   01/10/15 1402  BP: 138/80  Pulse: 71  Temp: 98.1 F (36.7 C)    Body mass index is 26.28 kg/(m^2).  GENERAL: vitals reviewed and listed above, alert, oriented, appears well hydrated and in no acute distress  HEENT: atraumatic, conjunttiva clear, no obvious abnormalities on inspection of external nose and ears  NECK: no obvious masses on inspection  MS: moves all extremities without noticeable abnormality Normal Gait Normal inspection of back except for increased thoracic kyphosis and mild scoliosis  TTP at: L PSIS and in R upper glutes -/+ tests: neg trendelenburg,-facet loading, -SLRT, -CLRT, -FABER, -FADIR Muscle strength equal and semtric in LEs bilat, sensation to light touch  and DTRs in LEs bilaterally  PSYCH: pleasant and cooperative, no obvious depression or anxiety  ASSESSMENT AND PLAN:  Discussed the following assessment and plan:  Low back pain without sciatica, unspecified back pain laterality  -suspect musculoskeletal origin, possible sacroiliitis -opted for conservative measures and follow up in 4 weeks -Patient advised to return or notify a doctor immediately if symptoms worsen or persist or new concerns arise.  Patient Instructions  BEFORE YOU LEAVE: -sacroiliitis exercises -schedule follow up with Dr. Burnice Logan in 4 weeks  Heat for 15 minutes twice daily  Tylenol 500mg  up to 3 times daily as needed for pain     KIM, HANNAH R.

## 2015-01-10 NOTE — Patient Instructions (Signed)
BEFORE YOU LEAVE: -sacroiliitis exercises -schedule follow up with Dr. Burnice Logan in 4 weeks  Heat for 15 minutes twice daily  Tylenol 500mg  up to 3 times daily as needed for pain

## 2015-01-10 NOTE — Progress Notes (Signed)
Pre visit review using our clinic review tool, if applicable. No additional management support is needed unless otherwise documented below in the visit note. 

## 2015-01-20 ENCOUNTER — Telehealth: Payer: Self-pay | Admitting: Internal Medicine

## 2015-01-20 NOTE — Telephone Encounter (Signed)
Pt daughter called and is requesting to see Dr Raliegh Ip for her mom buttock pain. May I use SDA  01/23/10

## 2015-01-20 NOTE — Telephone Encounter (Signed)
Yes, that is fine. 

## 2015-01-24 ENCOUNTER — Telehealth: Payer: Self-pay | Admitting: Adult Health

## 2015-01-24 ENCOUNTER — Ambulatory Visit (INDEPENDENT_AMBULATORY_CARE_PROVIDER_SITE_OTHER)
Admission: RE | Admit: 2015-01-24 | Discharge: 2015-01-24 | Disposition: A | Discharge status: Home / Self Care | Payer: Medicare Other | Source: Ambulatory Visit | Attending: Adult Health | Admitting: Adult Health

## 2015-01-24 ENCOUNTER — Ambulatory Visit (INDEPENDENT_AMBULATORY_CARE_PROVIDER_SITE_OTHER): Payer: Medicare Other | Admitting: Adult Health

## 2015-01-24 ENCOUNTER — Encounter: Payer: Self-pay | Admitting: Adult Health

## 2015-01-24 VITALS — BP 130/80 | HR 71 | Temp 98.0°F | Ht 62.0 in | Wt 139.6 lb

## 2015-01-24 DIAGNOSIS — R059 Cough, unspecified: Secondary | ICD-10-CM

## 2015-01-24 DIAGNOSIS — R05 Cough: Secondary | ICD-10-CM

## 2015-01-24 DIAGNOSIS — M25551 Pain in right hip: Secondary | ICD-10-CM

## 2015-01-24 MED ORDER — BACLOFEN 10 MG PO TABS: ORAL_TABLET | ORAL | Status: DC

## 2015-01-24 MED ORDER — METHYLPREDNISOLONE ACETATE 80 MG/ML IJ SUSP
60.0000 mg | Freq: Once | INTRAMUSCULAR | Status: AC
  Administered 2015-01-24: 60 mg via INTRAMUSCULAR

## 2015-01-24 NOTE — Progress Notes (Addendum)
   Subjective:    Patient ID: Mary Guzman, female    DOB: 07/24/1921, 81 y.o.   MRN: [id number]  Cough Associated symptoms include myalgias and wheezing. Pertinent negatives include no chest pain, ear pain, eye redness, postnasal drip, rhinorrhea or sore throat.    Patient presents to the office today for continued right hip pain.   Patient had a fall in February but did not have any discomfort since the end of April. Was seen on 01/10/2015 by Dr. Maudie Mercury. Has been following her discharge instructions. Patient endorses no improvement in her right buttock pain.   She endorses the pain is "pitching", she has no pain when she is sitting. Also endorses pain with walking.   She also endorses intermittant productive cough for two weeks that has not been getting any better. She had a fever at the beginning of the two weeks but nothing since.   Denies any n/v/d/ SOB/ CP. No PND or rhinorrhea.     Review of Systems  HENT: Negative for ear discharge, ear pain, postnasal drip, rhinorrhea, sneezing, sore throat and trouble swallowing.   Eyes: Negative for redness.  Respiratory: Positive for cough and wheezing. Negative for chest tightness.   Cardiovascular: Negative for chest pain, palpitations and leg swelling.  Musculoskeletal: Positive for myalgias and gait problem. Negative for back pain and joint swelling.       Right lateral buttock pain  All other systems reviewed and are negative.      Objective:   Physical Exam  Constitutional: She is oriented to person, place, and time. She appears well-developed and well-nourished. No distress.  HENT:  Head: Normocephalic and atraumatic.  Right Ear: External ear normal.  Left Ear: External ear normal.  Mouth/Throat: Oropharynx is clear and moist. No oropharyngeal exudate.  Cardiovascular: Normal rate, regular rhythm, normal heart sounds and intact distal pulses.  Exam reveals no gallop and no friction rub.   No murmur heard. Pulmonary/Chest:  Effort normal. No respiratory distress. She has wheezes (able to clear wheezing in bilateral lower lobes with cough. No wheezing in lower). She has no rales.  Musculoskeletal: She exhibits tenderness (Tenderness with deep palpation to right lower lateral buttocks. No bruising, redness, swelling or warmth noted). She exhibits no edema.  "Pullling" pain in right gluteal muscle with flexion of right leg. No pain with extension  Lymphadenopathy:    She has no cervical adenopathy.  Neurological: She is alert and oriented to person, place, and time.  Skin: Skin is warm and dry. No rash noted. She is not diaphoretic. No erythema. No pallor.  Psychiatric: She has a normal mood and affect. Her behavior is normal. Judgment and thought content normal.  Nursing note and vitals reviewed.        Assessment & Plan:  1. Right hip pain - methylPREDNISolone acetate (DEPO-MEDROL) injection 60 mg; Inject 0.75 mLs (60 mg total) into the muscle once. - baclofen (LIORESAL) 10 MG tablet; Take 1/2 tablet once a day.  Dispense: 10 each; Refill: 0 - Take one half a tablet daily.  - Ice area of pain for 15 minutes every hour  - DG Pelvis 1-2 Views; Future - Use caution when getting out of bed, out of chair or off cough.  - Follow up if no improvement in 2-3 days  2. Cough - DG Chest 2 View; Future - Likely viral - Follow up if no improvement.

## 2015-01-24 NOTE — Patient Instructions (Signed)
I will let you know what your x ray shows. In the mean time, see how the steroid shot works. If it helps with your pain, then you don't have to take the muscle relaxer. If you do take the muscle relaxer then only take it when you will not be up moing around or driving for the next several hours. Please be careful walking or getting out of bed, chair or couch. Start using ice on the injured site. Follow up with me in 2-3 days if no improvement.    Continue to drink plenty of fluid and get a lot of rest. If you notice that your cough is getting worse or you have a fever greater than 101, then please follow up with me. If you need anything in the mean time, please let me know. It was great meeting you today! Have a Happy 89nd Birthday!

## 2015-01-24 NOTE — Telephone Encounter (Signed)
Spoke to patient and her daughter to inform them that the chest x ray and pelvic x ray came back normal. Patient endorses improved pain control since steroid injection.

## 2015-01-24 NOTE — Progress Notes (Signed)
Pre visit review using our clinic review tool, if applicable. No additional management support is needed unless otherwise documented below in the visit note. 

## 2015-01-25 ENCOUNTER — Ambulatory Visit: Payer: Medicare Other | Admitting: Internal Medicine

## 2015-02-07 ENCOUNTER — Encounter: Payer: Self-pay | Admitting: Internal Medicine

## 2015-02-07 ENCOUNTER — Ambulatory Visit (INDEPENDENT_AMBULATORY_CARE_PROVIDER_SITE_OTHER): Payer: Medicare Other | Admitting: Internal Medicine

## 2015-02-07 VITALS — BP 140/80 | HR 72 | Temp 98.0°F | Resp 20 | Ht 62.0 in | Wt 138.0 lb

## 2015-02-07 DIAGNOSIS — N183 Chronic kidney disease, stage 3 unspecified: Secondary | ICD-10-CM

## 2015-02-07 DIAGNOSIS — M810 Age-related osteoporosis without current pathological fracture: Secondary | ICD-10-CM

## 2015-02-07 DIAGNOSIS — M150 Primary generalized (osteo)arthritis: Secondary | ICD-10-CM | POA: Diagnosis not present

## 2015-02-07 DIAGNOSIS — I10 Essential (primary) hypertension: Secondary | ICD-10-CM | POA: Diagnosis not present

## 2015-02-07 DIAGNOSIS — E039 Hypothyroidism, unspecified: Secondary | ICD-10-CM | POA: Diagnosis not present

## 2015-02-07 DIAGNOSIS — M159 Polyosteoarthritis, unspecified: Secondary | ICD-10-CM

## 2015-02-07 NOTE — Progress Notes (Signed)
Pre visit review using our clinic review tool, if applicable. No additional management support is needed unless otherwise documented below in the visit note. 

## 2015-02-07 NOTE — Progress Notes (Signed)
Subjective:    Patient ID: Mary Guzman, female    DOB: 21-Nov-1920, 81 y.o.   MRN: [id number]  HPI  81 year old patient who was seen earlier complaining of right hip pain.  Radiographs were obtained and reveal very mild arthritis only the right hip pain has resolved and now she has some left hip discomfort.  She has been doing some stretching exercises.  She was treated with Depo-Medrol with some benefit.  In general doing fairly well.  She'll be Donnie 81 years old in 2 days  Past Medical History  Diagnosis Date  . GERD (gastroesophageal reflux disease)   . Thyroid disease     Hypothyroidism  . Osteoporosis   . DJD (degenerative joint disease)   . 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  . Hypertension     History   Social History  . Marital Status: Single    Spouse Name: N/A  . Number of Children: N/A  . Years of Education: N/A   Occupational History  . Not on file.   Social History Main Topics  . Smoking status: Never Smoker   . Smokeless tobacco: Current User    Types: Chew  . Alcohol Use: No  . Drug Use: Not on file  . Sexual Activity: Not on file   Other Topics Concern  . Not on file   Social History Narrative   Fairly active interaction with grandchildren.    Past Surgical History  Procedure Laterality Date  . Mastectomy  1994  . Ankle surgery      Right  . Intracapsular cataract extraction      Family History  Problem Relation Age of Onset  . Alcohol abuse Brother   . Asthma Son     No Known Allergies  Current Outpatient Prescriptions on File Prior to Visit  Medication Sig Dispense Refill  . aspirin 81 MG EC tablet Take 81 mg by mouth daily.      . baclofen (LIORESAL) 10 MG tablet Take 1/2 tablet once a day. 10 each 0  . Calcium Carbonate (CALCIUM 500 PO) Take 1 tablet by mouth daily.      . Cholecalciferol (VITAMIN D3) 5000 UNITS CAPS Take 1 capsule by mouth daily.    . furosemide  (LASIX) 20 MG tablet Take 1 tablet (20 mg total) by mouth daily. (Patient taking differently: Take 20 mg by mouth as needed. ) 90 tablet 3  . ibuprofen (ADVIL,MOTRIN) 200 MG tablet Take 200 mg by mouth as needed.    Marland Kitchen levothyroxine (SYNTHROID, LEVOTHROID) 88 MCG tablet Take 1 tablet (88 mcg total) by mouth daily. 90 tablet 3  . Multiple Vitamins-Minerals (PRESERVISION AREDS 2 PO) Take 2 tablets by mouth 2 (two) times daily.    Marland Kitchen omeprazole (PRILOSEC) 20 MG capsule Take 20 mg by mouth as needed.     . raloxifene (EVISTA) 60 MG tablet Take 1 tablet (60 mg total) by mouth daily. 90 tablet 3   No current facility-administered medications on file prior to visit.    BP 140/80 mmHg  Pulse 72  Temp(Src) 98 F (36.7 C) (Oral)  Resp 20  Ht 5\' 2"  (1.575 m)  Wt 138 lb (62.596 kg)  BMI 25.23 kg/m2  SpO2 96%     Review of Systems  Constitutional: Negative.   HENT: Negative for congestion, dental problem, hearing loss, rhinorrhea, sinus pressure, sore throat and tinnitus.   Eyes: Negative for  pain, discharge and visual disturbance.  Respiratory: Negative for cough and shortness of breath.   Cardiovascular: Negative for chest pain, palpitations and leg swelling.  Gastrointestinal: Negative for nausea, vomiting, abdominal pain, diarrhea, constipation, blood in stool and abdominal distention.  Genitourinary: Negative for dysuria, urgency, frequency, hematuria, flank pain, vaginal bleeding, vaginal discharge, difficulty urinating, vaginal pain and pelvic pain.  Musculoskeletal: Positive for back pain, arthralgias and gait problem. Negative for joint swelling.  Skin: Negative for rash.  Neurological: Negative for dizziness, syncope, speech difficulty, weakness, numbness and headaches.  Hematological: Negative for adenopathy.  Psychiatric/Behavioral: Negative for behavioral problems, dysphoric mood and agitation. The patient is not nervous/anxious.        Objective:   Physical Exam    Constitutional: She is oriented to person, place, and time. She appears well-developed and well-nourished.  HENT:  Head: Normocephalic.  Right Ear: External ear normal.  Left Ear: External ear normal.  Mouth/Throat: Oropharynx is clear and moist.  Eyes: Conjunctivae and EOM are normal. Pupils are equal, round, and reactive to light.  Neck: Normal range of motion. Neck supple. No thyromegaly present.  Cardiovascular: Normal rate, regular rhythm, normal heart sounds and intact distal pulses.   Pulmonary/Chest: Effort normal and breath sounds normal.  Abdominal: Soft. Bowel sounds are normal. She exhibits no mass. There is no tenderness.  Musculoskeletal: Normal range of motion.  Full range of motion both hips  Lymphadenopathy:    She has no cervical adenopathy.  Neurological: She is alert and oriented to person, place, and time.  Skin: Skin is warm and dry. No rash noted.  Psychiatric: She has a normal mood and affect. Her behavior is normal.          Assessment & Plan:   Left hip pain.  This  seems more musculoligamentous.  Will continue warm compresses, gentle stretching, and Tylenol. Chronic kidney disease Hypertension, stable  Recheck 6 months

## 2015-02-07 NOTE — Patient Instructions (Signed)
Limit your sodium (Salt) intake  Return in 6 months for follow-up  Call or return to clinic prn if these symptoms worsen or fail to improve as anticipated.

## 2015-03-23 ENCOUNTER — Telehealth: Payer: Self-pay | Admitting: Internal Medicine

## 2015-03-23 NOTE — Telephone Encounter (Signed)
I spoke with pt daughter and pt prefers to come in and see Dr. Raliegh Ip.

## 2015-03-23 NOTE — Telephone Encounter (Signed)
Spoke to pt's daughter [name], told her can see pt tomorrow at 2:30 PM with Dr.K. [name] verbalized understanding, appointment scheduled.

## 2015-03-23 NOTE — Telephone Encounter (Signed)
[facility] - Client [place] [facility] Patient Name: RECIE CARNELL DOB: [birth year] Initial Comment Caller states her mother was diagnosed with a pulled muscle in her hip. After a prednisone shot it did become better but it now has moved to the other hip. It is to the point that she has difficulty walking. Nurse Assessment Nurse: Martyn Ehrich, RN, Felicia Date/Time (Eastern Time): 03/23/2015 4:25:02 PM Confirm and document reason for call. If symptomatic, describe symptoms. ---Pt had a prednisone shot in R hip on May 10 for pulled muscle. Now her other hip is bothering her and she is having trouble walking all along but last week is a lot of trouble bc of pain in L buttock. She has been trying to do the exercises but it is worse. No fever Has the patient traveled out of the country within the last 30 days? ---No Does the patient require triage? ---Yes Related visit to physician within the last 2 weeks? ---No Does the PT have any chronic conditions? (i.e. diabetes, asthma, etc.) ---NoGuidelines Guideline Title Affirmed Question Affirmed Notes Hip Pain [1] SEVERE pain (e.g., excruciating, unable to do any normal activities) AND [2] not improved after 2 hours of pain medicine Final Disposition User See Physician within 4 Hours (or PCP triage) Martyn Ehrich, RN, Felicia Comments pain is in center of buttock urination has not changed , no pain and no blood in it PT DECLINES TO GO TO [facility] THIS EVENING. SHE PREFERS TO BE SEEN TOM IN THE AFTERNOON AT THE OFFICE. (pain of 9 when walking) Told caller will send this record to the office and someone will call her Call Id: [id number]

## 2015-03-24 ENCOUNTER — Encounter: Payer: Self-pay | Admitting: Internal Medicine

## 2015-03-24 ENCOUNTER — Ambulatory Visit (INDEPENDENT_AMBULATORY_CARE_PROVIDER_SITE_OTHER): Payer: Medicare Other | Admitting: Internal Medicine

## 2015-03-24 DIAGNOSIS — M791 Myalgia: Secondary | ICD-10-CM

## 2015-03-24 DIAGNOSIS — M150 Primary generalized (osteo)arthritis: Secondary | ICD-10-CM | POA: Diagnosis not present

## 2015-03-24 DIAGNOSIS — M7918 Myalgia, other site: Secondary | ICD-10-CM

## 2015-03-24 DIAGNOSIS — I10 Essential (primary) hypertension: Secondary | ICD-10-CM | POA: Diagnosis not present

## 2015-03-24 DIAGNOSIS — N183 Chronic kidney disease, stage 3 unspecified: Secondary | ICD-10-CM

## 2015-03-24 DIAGNOSIS — M159 Polyosteoarthritis, unspecified: Secondary | ICD-10-CM

## 2015-03-24 MED ORDER — METHYLPREDNISOLONE ACETATE 80 MG/ML IJ SUSP
40.0000 mg | Freq: Once | INTRAMUSCULAR | Status: AC
  Administered 2015-03-24: 40 mg via INTRAMUSCULAR

## 2015-03-24 NOTE — Progress Notes (Signed)
Subjective:    Patient ID: Mary Guzman, female    DOB: Jun 07, 1921, 81 y.o.   MRN: [id number]  HPI  81 year old patient who has had some pain involving the right buttock area.  When she was seen 6 weeks ago, this had resolved and she was first noticing discomfort in the left buttock area.  Over the past 2 weeks.  This has intensified.  Earlier she had improved after a Depo-Medrol injection to the right buttock area.  She walks with a cane.  Pain seems worse with prolonged sitting.  She has benefit with warm compresses   Past Medical History  Diagnosis Date  . GERD (gastroesophageal reflux disease)   . Thyroid disease     Hypothyroidism  . Osteoporosis   . DJD (degenerative joint disease)   . 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  . Hypertension     History   Social History  . Marital Status: Single    Spouse Name: N/A  . Number of Children: N/A  . Years of Education: N/A   Occupational History  . Not on file.   Social History Main Topics  . Smoking status: Never Smoker   . Smokeless tobacco: Current User    Types: Chew  . Alcohol Use: No  . Drug Use: Not on file  . Sexual Activity: Not on file   Other Topics Concern  . Not on file   Social History Narrative   Fairly active interaction with grandchildren.    Past Surgical History  Procedure Laterality Date  . Mastectomy  1994  . Ankle surgery      Right  . Intracapsular cataract extraction      Family History  Problem Relation Age of Onset  . Alcohol abuse Brother   . Asthma Son     No Known Allergies  Current Outpatient Prescriptions on File Prior to Visit  Medication Sig Dispense Refill  . aspirin 81 MG EC tablet Take 81 mg by mouth daily.      . Calcium Carbonate (CALCIUM 500 PO) Take 1 tablet by mouth daily.      . Cholecalciferol (VITAMIN D3) 5000 UNITS CAPS Take 1 capsule by mouth daily.    . furosemide (LASIX) 20 MG tablet Take 1  tablet (20 mg total) by mouth daily. (Patient taking differently: Take 20 mg by mouth as needed. ) 90 tablet 3  . levothyroxine (SYNTHROID, LEVOTHROID) 88 MCG tablet Take 1 tablet (88 mcg total) by mouth daily. 90 tablet 3  . Multiple Vitamins-Minerals (PRESERVISION AREDS 2 PO) Take 2 tablets by mouth 2 (two) times daily.    Marland Kitchen omeprazole (PRILOSEC) 20 MG capsule Take 20 mg by mouth as needed.     . raloxifene (EVISTA) 60 MG tablet Take 1 tablet (60 mg total) by mouth daily. 90 tablet 3   No current facility-administered medications on file prior to visit.    BP 140/74 mmHg  Pulse 76  Temp(Src) 98.3 F (36.8 C) (Oral)  Resp 20  Ht 5\' 2"  (1.575 m)  Wt 143 lb (64.864 kg)  BMI 26.15 kg/m2  SpO2 97%     Review of Systems  Constitutional: Negative.   HENT: Negative for congestion, dental problem, hearing loss, rhinorrhea, sinus pressure, sore throat and tinnitus.   Eyes: Negative for pain, discharge and visual disturbance.  Respiratory: Negative for cough and shortness of breath.   Cardiovascular: Negative for chest pain,  palpitations and leg swelling.  Gastrointestinal: Negative for nausea, vomiting, abdominal pain, diarrhea, constipation, blood in stool and abdominal distention.  Genitourinary: Negative for dysuria, urgency, frequency, hematuria, flank pain, vaginal bleeding, vaginal discharge, difficulty urinating, vaginal pain and pelvic pain.  Musculoskeletal: Negative for joint swelling, arthralgias and gait problem.       Left buttock pain  Skin: Negative for rash.  Neurological: Negative for dizziness, syncope, speech difficulty, weakness, numbness and headaches.  Hematological: Negative for adenopathy.  Psychiatric/Behavioral: Negative for behavioral problems, dysphoric mood and agitation. The patient is not nervous/anxious.        Objective:   Physical Exam  Constitutional: She appears well-developed and well-nourished. No distress.  Musculoskeletal:  Tenderness over  the left ischium Flexion of the left hip also tends to aggravate the discomfort in this area          Assessment & Plan:   Left buttock pain.  Probable mechanical; tenderness over the left ischium.  Patient has had a similar pain involving the right buttock area that responded to local Depo-Medrol.  Will repeat Essential hypertension, stable

## 2015-03-24 NOTE — Patient Instructions (Signed)
You  may move around, but avoid painful motions and activities.  Apply heat  to the sore area for 15 to 20 minutes 3 or 4 times daily for the next two to 3 days.  Call if you develop any new or worsening symptoms 

## 2015-03-24 NOTE — Progress Notes (Signed)
Pre visit review using our clinic review tool, if applicable. No additional management support is needed unless otherwise documented below in the visit note. 

## 2015-03-24 NOTE — Addendum Note (Signed)
Addended by: Marian Sorrow on: 03/24/2015 03:24 PM   Modules accepted: Orders

## 2015-04-04 ENCOUNTER — Telehealth: Payer: Self-pay | Admitting: Internal Medicine

## 2015-04-04 DIAGNOSIS — M159 Polyosteoarthritis, unspecified: Secondary | ICD-10-CM

## 2015-04-04 DIAGNOSIS — M25552 Pain in left hip: Secondary | ICD-10-CM

## 2015-04-04 DIAGNOSIS — M150 Primary generalized (osteo)arthritis: Secondary | ICD-10-CM

## 2015-04-04 NOTE — Telephone Encounter (Signed)
Daughter is calling to let you know what has been going on with her mother as well as seek advice on what their next steps should be. The Prednisone has not helped her bursitis and they need further direction please.

## 2015-04-04 NOTE — Telephone Encounter (Signed)
Please see message and advise 

## 2015-04-05 ENCOUNTER — Other Ambulatory Visit: Payer: Self-pay | Admitting: Internal Medicine

## 2015-04-05 MED ORDER — TRAMADOL HCL 50 MG PO TABS: 50.0000 mg | ORAL_TABLET | Freq: Four times a day (QID) | ORAL | Status: DC | PRN

## 2015-04-05 NOTE — Telephone Encounter (Signed)
Ortho  Referral Tramadol

## 2015-04-05 NOTE — Telephone Encounter (Signed)
Spoke to pt's daughter [name], told her Dr. Raliegh Ip said to see Ortho, so I sent the referral and someone will contact you regarding an appointment. Also I faxed a Rx to the pharmacy for Tramadol to help with pain. [name] verbalized understanding.

## 2015-05-31 ENCOUNTER — Telehealth: Payer: Self-pay | Admitting: Internal Medicine

## 2015-05-31 NOTE — Telephone Encounter (Signed)
Pt went to 2nd therapy visit today. When they took her bp today it was 172/106 and 171/92. They stopped her treatment today due to pt's elevated bp and advised to call pcp.  Pt was telling them she was weak and nervous.  Daughter refused triage nurse. Also advised her dr Raliegh Ip out this afternoon and would be in the am before he wuld return. Daughter verbalized understanding. She states now that pt is out of their office, she is fine, hungry and calm. She was glad to be out of there. pls advise

## 2015-06-01 ENCOUNTER — Encounter: Payer: Self-pay | Admitting: Internal Medicine

## 2015-06-01 ENCOUNTER — Ambulatory Visit (INDEPENDENT_AMBULATORY_CARE_PROVIDER_SITE_OTHER): Payer: Medicare Other | Admitting: Internal Medicine

## 2015-06-01 VITALS — BP 130/78 | HR 88 | Temp 98.1°F | Resp 18 | Ht 62.0 in | Wt 137.0 lb

## 2015-06-01 DIAGNOSIS — R531 Weakness: Secondary | ICD-10-CM | POA: Diagnosis not present

## 2015-06-01 DIAGNOSIS — M159 Polyosteoarthritis, unspecified: Secondary | ICD-10-CM

## 2015-06-01 DIAGNOSIS — M150 Primary generalized (osteo)arthritis: Secondary | ICD-10-CM | POA: Diagnosis not present

## 2015-06-01 DIAGNOSIS — I10 Essential (primary) hypertension: Secondary | ICD-10-CM

## 2015-06-01 DIAGNOSIS — R600 Localized edema: Secondary | ICD-10-CM | POA: Diagnosis not present

## 2015-06-01 DIAGNOSIS — E039 Hypothyroidism, unspecified: Secondary | ICD-10-CM

## 2015-06-01 DIAGNOSIS — N183 Chronic kidney disease, stage 3 unspecified: Secondary | ICD-10-CM

## 2015-06-01 NOTE — Patient Instructions (Signed)
Limit your sodium (Salt) intake  Please check your blood pressure on a regular basis.  If it is consistently greater than 150/90, please make an office appointment.  Return in one month for follow-up  Report any new or worsening symptoms

## 2015-06-01 NOTE — Progress Notes (Signed)
Subjective:    Patient ID: Mary Guzman, female    DOB: Sep 16, 1921, 81 y.o.   MRN: [id number]  HPI  Wt Readings from Last 3 Encounters:  06/01/15 137 lb (62.143 kg)  03/24/15 143 lb (64.864 kg)  02/07/15 138 lb (62.596 kg)    BP Readings from Last 3 Encounters:  06/01/15 130/78  03/24/15 140/74  02/07/15 16/76   81 year old patient who has a history of osteoarthritis, remote breast cancer and essential hypertension.  She has chronic lower extremity edema.  She has chronic kidney disease.  For the past couple weeks she has had some increasing fatigue and weakness.  She has had some right hip pain and is undergoing physical therapy.  Her blood pressure and pulse have been a bit labile and physical therapy has been canceled at times due to elevated blood pressure. Medical regimen includes levothyroxin.  She has a history of chronic anemia thought secondary to her chronic kidney disease.  There is been some modest weight loss compared to July, but unchanged over the past 4 months.  She does describe some early satiety.  No change in bowel habits.  No nausea or vomiting.  Denies any abdominal pain  Past Medical History  Diagnosis Date  . GERD (gastroesophageal reflux disease)   . Thyroid disease     Hypothyroidism  . Osteoporosis   . DJD (degenerative joint disease)   . 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  . Hypertension     Social History   Social History  . Marital Status: Single    Spouse Name: N/A  . Number of Children: N/A  . Years of Education: N/A   Occupational History  . Not on file.   Social History Main Topics  . Smoking status: Never Smoker   . Smokeless tobacco: Current User    Types: Chew  . Alcohol Use: No  . Drug Use: Not on file  . Sexual Activity: Not on file   Other Topics Concern  . Not on file   Social History Narrative   Fairly active interaction with grandchildren.    Past  Surgical History  Procedure Laterality Date  . Mastectomy  1994  . Ankle surgery      Right  . Intracapsular cataract extraction      Family History  Problem Relation Age of Onset  . Alcohol abuse Brother   . Asthma Son     No Known Allergies  Current Outpatient Prescriptions on File Prior to Visit  Medication Sig Dispense Refill  . acetaminophen (TYLENOL) 500 MG tablet Take 500 mg by mouth every 6 (six) hours as needed.    Marland Kitchen aspirin 81 MG EC tablet Take 81 mg by mouth daily.      . Calcium Carbonate (CALCIUM 500 PO) Take 1 tablet by mouth daily.      . Cholecalciferol (VITAMIN D3) 5000 UNITS CAPS Take 1 capsule by mouth daily.    . furosemide (LASIX) 20 MG tablet Take 1 tablet (20 mg total) by mouth daily. (Patient taking differently: Take 20 mg by mouth as needed. ) 90 tablet 3  . levothyroxine (SYNTHROID, LEVOTHROID) 88 MCG tablet Take 1 tablet (88 mcg total) by mouth daily. 90 tablet 3  . Multiple Vitamins-Minerals (PRESERVISION AREDS 2 PO) Take 2 tablets by mouth 2 (two) times daily.    . naproxen sodium (ALEVE) 220 MG tablet Take 220 mg by mouth 2 (  two) times daily with a meal.    . omeprazole (PRILOSEC) 20 MG capsule Take 20 mg by mouth as needed.     . raloxifene (EVISTA) 60 MG tablet Take 1 tablet (60 mg total) by mouth daily. 90 tablet 3   No current facility-administered medications on file prior to visit.    BP 130/78 mmHg  Pulse 88  Temp(Src) 98.1 F (36.7 C) (Oral)  Resp 18  Ht 5\' 2"  (1.575 m)  Wt 137 lb (62.143 kg)  BMI 25.05 kg/m2  SpO2 96%    Review of Systems  Constitutional: Positive for fatigue.  HENT: Negative for congestion, dental problem, hearing loss, rhinorrhea, sinus pressure, sore throat and tinnitus.   Eyes: Negative for pain, discharge and visual disturbance.  Respiratory: Negative for cough and shortness of breath.   Cardiovascular: Negative for chest pain, palpitations and leg swelling.  Gastrointestinal: Negative for nausea, vomiting,  abdominal pain, diarrhea, constipation, blood in stool and abdominal distention.  Genitourinary: Negative for dysuria, urgency, frequency, hematuria, flank pain, vaginal bleeding, vaginal discharge, difficulty urinating, vaginal pain and pelvic pain.  Musculoskeletal: Negative for joint swelling, arthralgias and gait problem.  Skin: Negative for rash.  Neurological: Positive for tremors and weakness. Negative for dizziness, syncope, speech difficulty, numbness and headaches.  Hematological: Negative for adenopathy.  Psychiatric/Behavioral: Negative for behavioral problems, dysphoric mood and agitation. The patient is nervous/anxious.        Objective:   Physical Exam  Constitutional: She is oriented to person, place, and time. She appears well-developed and well-nourished.  Blood pressure 130/70  HENT:  Head: Normocephalic.  Right Ear: External ear normal.  Left Ear: External ear normal.  Mouth/Throat: Oropharynx is clear and moist.  Eyes: Conjunctivae and EOM are normal. Pupils are equal, round, and reactive to light.  Neck: Normal range of motion. Neck supple. No thyromegaly present.  Cardiovascular: Normal rate, regular rhythm, normal heart sounds and intact distal pulses.   Pulmonary/Chest: Effort normal and breath sounds normal.  Abdominal: Soft. Bowel sounds are normal. She exhibits no mass. There is no tenderness. There is no rebound and no guarding.  Musculoskeletal: Normal range of motion. She exhibits edema.  Lymphadenopathy:    She has no cervical adenopathy.  Neurological: She is alert and oriented to person, place, and time.  Skin: Skin is warm and dry. No rash noted.  Psychiatric: She has a normal mood and affect. Her behavior is normal.          Assessment & Plan:  Fatigue Weakness Labile hypertension Chronic kidney disease History of anemia Osteoarthritis  We'll check screening lab including TSH.  Blood pressure well controlled at present.  Will continue to  observe off medication Recheck 4 weeks or as needed Low-salt diet recommended

## 2015-06-01 NOTE — Progress Notes (Signed)
Pre visit review using our clinic review tool, if applicable. No additional management support is needed unless otherwise documented below in the visit note. 

## 2015-06-01 NOTE — Telephone Encounter (Signed)
Spoke to [place] pt's daughter, asked her how pt's blood pressure was yesterday after PT visit. [name] said Bp was running anywhere from 125/78- 154/84, pulse 74-103. [name] said she asked pt if she has been feeling tired,weak and nervous for awhile prior to PT and pt said yes. Rolanda Lundborg I think pt should come in today to be evaluated and check TSH has not been checked since Feb. [name] verbalized understanding. Appointment scheduled for 4:15 today with Dr.K. [name] verbalized understanding.

## 2015-06-02 ENCOUNTER — Telehealth: Payer: Self-pay | Admitting: Internal Medicine

## 2015-06-02 LAB — CBC WITH DIFFERENTIAL/PLATELET
BASOS ABS: 0.1 10*3/uL (ref 0.0–0.1)
Basophils Relative: 1.5 % (ref 0.0–3.0)
EOS ABS: 0.3 10*3/uL (ref 0.0–0.7)
Eosinophils Relative: 4 % (ref 0.0–5.0)
HCT: 32.3 % — ABNORMAL LOW (ref 36.0–46.0)
HEMOGLOBIN: 10.2 g/dL — AB (ref 12.0–15.0)
Lymphocytes Relative: 23.7 % (ref 12.0–46.0)
Lymphs Abs: 1.7 10*3/uL (ref 0.7–4.0)
MCHC: 31.5 g/dL (ref 30.0–36.0)
MCV: 77.6 fl — AB (ref 78.0–100.0)
Monocytes Absolute: 0.4 10*3/uL (ref 0.1–1.0)
Monocytes Relative: 6.1 % (ref 3.0–12.0)
Neutro Abs: 4.6 10*3/uL (ref 1.4–7.7)
Neutrophils Relative %: 64.7 % (ref 43.0–77.0)
Platelets: 416 10*3/uL — ABNORMAL HIGH (ref 150.0–400.0)
RBC: 4.16 Mil/uL (ref 3.87–5.11)
RDW: 20.1 % — ABNORMAL HIGH (ref 11.5–15.5)
WBC: 7.1 10*3/uL (ref 4.0–10.5)

## 2015-06-02 LAB — COMPREHENSIVE METABOLIC PANEL
ALK PHOS: 102 U/L (ref 39–117)
ALT: 18 U/L (ref 0–35)
AST: 29 U/L (ref 0–37)
Albumin: 3.6 g/dL (ref 3.5–5.2)
BILIRUBIN TOTAL: 0.3 mg/dL (ref 0.2–1.2)
BUN: 47 mg/dL — ABNORMAL HIGH (ref 6–23)
CO2: 24 mEq/L (ref 19–32)
Calcium: 8.5 mg/dL (ref 8.4–10.5)
Chloride: 106 mEq/L (ref 96–112)
Creatinine, Ser: 1.75 mg/dL — ABNORMAL HIGH (ref 0.40–1.20)
GFR: 28.87 mL/min — AB (ref >60.00)
Glucose, Bld: 96 mg/dL (ref 70–99)
Potassium: 4.2 mEq/L (ref 3.5–5.1)
Sodium: 138 mEq/L (ref 135–145)
TOTAL PROTEIN: 6.6 g/dL (ref 6.0–8.3)

## 2015-06-02 LAB — TSH: TSH: 1.9 u[IU]/mL (ref 0.35–4.50)

## 2015-06-02 LAB — SEDIMENTATION RATE: Sed Rate: 29 mm/hr — ABNORMAL HIGH (ref 0–22)

## 2015-06-02 NOTE — Telephone Encounter (Signed)
Please call/notify patient that lab/test/procedure is normal; mild anemia is improved

## 2015-06-02 NOTE — Telephone Encounter (Signed)
Spoke to pt's daughter [name], told her labs were normal, mild anemia is improved. [name] verbalized understanding and asked what should we do. Told her to continue to monitor blood pressure if greater than 140/90 to call office, follow  Low salt diet as recommended and follow up in 4 weeks. [name] verbalized understanding.

## 2015-06-02 NOTE — Telephone Encounter (Signed)
daughter would like to know if you have the results of pt's labs   And are they do anything about them? She is at the pt's house, so pls call on that [phone]

## 2015-06-02 NOTE — Telephone Encounter (Signed)
Pt calling for lab results from yesterday, please advise.

## 2015-06-09 ENCOUNTER — Other Ambulatory Visit: Payer: Self-pay | Admitting: *Deleted

## 2015-06-09 MED ORDER — RALOXIFENE HCL 60 MG PO TABS: 60.0000 mg | ORAL_TABLET | Freq: Every day | ORAL | Status: DC

## 2015-06-09 MED ORDER — LEVOTHYROXINE SODIUM 88 MCG PO TABS: 88.0000 ug | ORAL_TABLET | Freq: Every day | ORAL | Status: DC

## 2015-06-12 ENCOUNTER — Other Ambulatory Visit: Payer: Self-pay | Admitting: Internal Medicine

## 2015-06-12 ENCOUNTER — Telehealth: Payer: Self-pay | Admitting: Internal Medicine

## 2015-06-12 ENCOUNTER — Ambulatory Visit (INDEPENDENT_AMBULATORY_CARE_PROVIDER_SITE_OTHER)
Admission: RE | Admit: 2015-06-12 | Discharge: 2015-06-12 | Disposition: A | Discharge status: Home / Self Care | Payer: Medicare Other | Source: Ambulatory Visit | Attending: Internal Medicine | Admitting: Internal Medicine

## 2015-06-12 DIAGNOSIS — R0602 Shortness of breath: Secondary | ICD-10-CM

## 2015-06-12 NOTE — Telephone Encounter (Signed)
Dr. K, please see message and advise. 

## 2015-06-12 NOTE — Telephone Encounter (Signed)
Pt daughter is calling to let donna know her mother is not doing as well as last visit. Pt daughter decline triage. Pt has no appetite, still jittery. Please advise  If [name] not at home please call her mother number

## 2015-06-15 ENCOUNTER — Ambulatory Visit (INDEPENDENT_AMBULATORY_CARE_PROVIDER_SITE_OTHER): Payer: Medicare Other | Admitting: Internal Medicine

## 2015-06-15 ENCOUNTER — Other Ambulatory Visit: Payer: Self-pay | Admitting: *Deleted

## 2015-06-15 ENCOUNTER — Encounter: Payer: Self-pay | Admitting: Internal Medicine

## 2015-06-15 VITALS — BP 136/80 | HR 93 | Temp 97.9°F | Resp 24 | Ht 62.0 in | Wt 139.0 lb

## 2015-06-15 DIAGNOSIS — I5022 Chronic systolic (congestive) heart failure: Secondary | ICD-10-CM

## 2015-06-15 DIAGNOSIS — R600 Localized edema: Secondary | ICD-10-CM

## 2015-06-15 DIAGNOSIS — N183 Chronic kidney disease, stage 3 unspecified: Secondary | ICD-10-CM

## 2015-06-15 DIAGNOSIS — I10 Essential (primary) hypertension: Secondary | ICD-10-CM

## 2015-06-15 LAB — BRAIN NATRIURETIC PEPTIDE

## 2015-06-15 LAB — BASIC METABOLIC PANEL
BUN: 58 mg/dL — AB (ref 6–23)
CO2: 23 meq/L (ref 19–32)
CREATININE: 2.18 mg/dL — AB (ref 0.40–1.20)
Calcium: 8.7 mg/dL (ref 8.4–10.5)
Chloride: 104 mEq/L (ref 96–112)
GFR: 22.41 mL/min — ABNORMAL LOW (ref >60.00)
Glucose, Bld: 87 mg/dL (ref 70–99)
Potassium: 4 mEq/L (ref 3.5–5.1)
Sodium: 139 mEq/L (ref 135–145)

## 2015-06-15 MED ORDER — METOPROLOL SUCCINATE ER 25 MG PO TB24: 12.5000 mg | ORAL_TABLET | Freq: Every day | ORAL | Status: DC

## 2015-06-15 MED ORDER — LISINOPRIL 5 MG PO TABS: 5.0000 mg | ORAL_TABLET | Freq: Every day | ORAL | Status: DC

## 2015-06-15 NOTE — Progress Notes (Signed)
Pre visit review using our clinic review tool, if applicable. No additional management support is needed unless otherwise documented below in the visit note. 

## 2015-06-15 NOTE — Progress Notes (Signed)
Subjective:    Patient ID: Mary Guzman, female    DOB: 1920/09/21, 81 y.o.   MRN: [id number]  HPI  Wt Readings from Last 3 Encounters:  06/15/15 139 lb (63.05 kg)  06/01/15 137 lb (62.143 kg)  03/24/15 143 lb (64.67 kg)     81 year old patient who is seen today for follow-up. She was seen 2 weeks ago and was placed on diuretic therapy. Complains of fatigue, shortness of breath nausea, poor appetite nervousness and shakiness her lower extremity edema is about the same  Laboratory screen was fairly unremarkable.  Stable renal insufficiency and anemia.  She has hypothyroidism and TSH was normal  Chest x-ray was consistent with congestive heart failure  She has not improved with diuretic therapy she has a long history of lower extremity edema.  Past Medical History  Diagnosis Date  . GERD (gastroesophageal reflux disease)   . Thyroid disease     Hypothyroidism  . Osteoporosis   . DJD (degenerative joint disease)   . 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  . Hypertension     Social History   Social History  . Marital Status: Single    Spouse Name: N/A  . Number of Children: N/A  . Years of Education: N/A   Occupational History  . Not on file.   Social History Main Topics  . Smoking status: Never Smoker   . Smokeless tobacco: Current User    Types: Chew  . Alcohol Use: No  . Drug Use: Not on file  . Sexual Activity: Not on file   Other Topics Concern  . Not on file   Social History Narrative   Fairly active interaction with grandchildren.    Past Surgical History  Procedure Laterality Date  . Mastectomy  1994  . Ankle surgery      Right  . Intracapsular cataract extraction      Family History  Problem Relation Age of Onset  . Alcohol abuse Brother   . Asthma Son     No Known Allergies  Current Outpatient Prescriptions on File Prior to Visit  Medication Sig Dispense Refill  . acetaminophen  (TYLENOL) 500 MG tablet Take 500 mg by mouth every 6 (six) hours as needed.    Marland Kitchen aspirin 81 MG EC tablet Take 81 mg by mouth daily.      . Calcium Carbonate (CALCIUM 500 PO) Take 1 tablet by mouth daily.      . Cholecalciferol (VITAMIN D3) 5000 UNITS CAPS Take 1 capsule by mouth daily.    . furosemide (LASIX) 20 MG tablet Take 1 tablet (20 mg total) by mouth daily. (Patient taking differently: Take 20 mg by mouth as needed. ) 90 tablet 3  . levothyroxine (SYNTHROID, LEVOTHROID) 88 MCG tablet Take 1 tablet (88 mcg total) by mouth daily. 90 tablet 1  . Multiple Vitamins-Minerals (PRESERVISION AREDS 2 PO) Take 2 tablets by mouth 2 (two) times daily.    . naproxen sodium (ALEVE) 220 MG tablet Take 220 mg by mouth 2 (two) times daily with a meal.    . omeprazole (PRILOSEC) 20 MG capsule Take 20 mg by mouth as needed.     . raloxifene (EVISTA) 60 MG tablet Take 1 tablet (60 mg total) by mouth daily. 90 tablet 1   No current facility-administered medications on file prior to visit.    BP 136/80 mmHg  Pulse 93  Temp(Src) 97.9 F (  36.6 C) (Oral)  Resp 24  Ht 5\' 2"  (1.575 m)  Wt 139 lb (63.05 kg)  BMI 25.42 kg/m2  SpO2 96%     Review of Systems  Constitutional: Positive for activity change, appetite change and fatigue.  HENT: Negative for congestion, dental problem, hearing loss, rhinorrhea, sinus pressure, sore throat and tinnitus.   Eyes: Negative for pain, discharge and visual disturbance.  Respiratory: Positive for shortness of breath. Negative for cough.   Cardiovascular: Positive for leg swelling. Negative for chest pain and palpitations.  Gastrointestinal: Positive for nausea. Negative for vomiting, abdominal pain, diarrhea, constipation, blood in stool and abdominal distention.  Genitourinary: Negative for dysuria, urgency, frequency, hematuria, flank pain, vaginal bleeding, vaginal discharge, difficulty urinating, vaginal pain and pelvic pain.  Musculoskeletal: Negative for joint  swelling, arthralgias and gait problem.  Skin: Negative for rash.  Neurological: Positive for tremors. Negative for dizziness, syncope, speech difficulty, weakness, numbness and headaches.  Hematological: Negative for adenopathy.  Psychiatric/Behavioral: Negative for behavioral problems, dysphoric mood and agitation. The patient is nervous/anxious.        Objective:   Physical Exam  Constitutional: She is oriented to person, place, and time. She appears well-developed and well-nourished.  HENT:  Head: Normocephalic.  Right Ear: External ear normal.  Left Ear: External ear normal.  Mouth/Throat: Oropharynx is clear and moist.  Eyes: Conjunctivae and EOM are normal. Pupils are equal, round, and reactive to light.  Neck: Normal range of motion. Neck supple. No thyromegaly present.  Cardiovascular: Normal rate, regular rhythm, normal heart sounds and intact distal pulses.   Pulmonary/Chest: Effort normal and breath sounds normal.  Decreased breath sounds to bases, right greater than the left. Rare basilar rales  Abdominal: Soft. Bowel sounds are normal. She exhibits no mass. There is no tenderness.  Musculoskeletal: Normal range of motion. She exhibits edema.  Lower extremity edema distal to the knees, more prominent in the calves Right ankle greater than left ankle edema No pedal edema  Lymphadenopathy:    She has no cervical adenopathy.  Neurological: She is alert and oriented to person, place, and time.  Skin: Skin is warm and dry. No rash noted.  Psychiatric: She has a normal mood and affect. Her behavior is normal.          Assessment & Plan:   Congestive heart failure.  We'll continue careful diuresis.  We'll decrease furosemide to 20 mg daily unless swelling worsens.  Add low-dose beta blocker therapy and Ace inhibition.  We'll check electrolytes and BNP as well as 2-D echocardiogram Essential hypertension

## 2015-06-15 NOTE — Patient Instructions (Addendum)
Limit your sodium (Salt) intake  Discontinue Evista  Discontinue naproxen  Furosemide 20 mg once daily  Return in 2 weeks for follow-up

## 2015-06-16 ENCOUNTER — Other Ambulatory Visit: Payer: Self-pay | Admitting: Internal Medicine

## 2015-06-16 DIAGNOSIS — R600 Localized edema: Secondary | ICD-10-CM

## 2015-06-16 DIAGNOSIS — N183 Chronic kidney disease, stage 3 unspecified: Secondary | ICD-10-CM

## 2015-06-16 DIAGNOSIS — R5383 Other fatigue: Secondary | ICD-10-CM

## 2015-06-16 DIAGNOSIS — I5022 Chronic systolic (congestive) heart failure: Secondary | ICD-10-CM

## 2015-06-23 ENCOUNTER — Telehealth: Payer: Self-pay

## 2015-06-23 NOTE — Telephone Encounter (Signed)
Left message regarding referral received from primary care. Pt does live in [place]. Wanted to verify with patient if she wanted to be seen in [place] or go to the CHF clinic in [place].

## 2015-06-26 ENCOUNTER — Other Ambulatory Visit: Payer: Self-pay

## 2015-06-26 ENCOUNTER — Ambulatory Visit (HOSPITAL_COMMUNITY): Payer: Medicare Other | Attending: Internal Medicine

## 2015-06-26 DIAGNOSIS — I340 Nonrheumatic mitral (valve) insufficiency: Secondary | ICD-10-CM | POA: Diagnosis not present

## 2015-06-26 DIAGNOSIS — I10 Essential (primary) hypertension: Secondary | ICD-10-CM | POA: Insufficient documentation

## 2015-06-26 DIAGNOSIS — I429 Cardiomyopathy, unspecified: Secondary | ICD-10-CM | POA: Insufficient documentation

## 2015-06-26 DIAGNOSIS — J90 Pleural effusion, not elsewhere classified: Secondary | ICD-10-CM | POA: Diagnosis not present

## 2015-06-26 DIAGNOSIS — I5022 Chronic systolic (congestive) heart failure: Secondary | ICD-10-CM | POA: Diagnosis not present

## 2015-06-26 DIAGNOSIS — R29898 Other symptoms and signs involving the musculoskeletal system: Secondary | ICD-10-CM | POA: Diagnosis not present

## 2015-06-26 DIAGNOSIS — Z8249 Family history of ischemic heart disease and other diseases of the circulatory system: Secondary | ICD-10-CM | POA: Diagnosis not present

## 2015-06-26 DIAGNOSIS — I313 Pericardial effusion (noninflammatory): Secondary | ICD-10-CM | POA: Insufficient documentation

## 2015-06-26 NOTE — Telephone Encounter (Signed)
Patients daughter called back does want to go to [place] if possible as they live there I will forward the referral to the [facility] CHF clinic.

## 2015-06-26 NOTE — Progress Notes (Signed)
Patient ID: Mary Guzman, female   DOB: [birth year], 81 y.o.   MRN: [id number]   2D Echocardiogram Complete.  06/26/2015   Deliah Boston, RDCS   Preliminary Technician Findings:  Moderately Decreased Ejection Fraction (around 15-20%), Moderate TR and MR, with Mild AI.  I discussed these findings with Dr. Marlou Porch prior to the patient leaving, who is comfortable letting her follow up with her primary.  [name] does not complain of any symptoms besides fatigue and appears stable at the time of the exam.

## 2015-06-29 ENCOUNTER — Ambulatory Visit: Payer: Medicare Other | Admitting: Internal Medicine

## 2015-06-29 ENCOUNTER — Other Ambulatory Visit: Payer: Self-pay | Admitting: Internal Medicine

## 2015-06-30 ENCOUNTER — Encounter: Payer: Self-pay | Admitting: Internal Medicine

## 2015-06-30 ENCOUNTER — Ambulatory Visit (INDEPENDENT_AMBULATORY_CARE_PROVIDER_SITE_OTHER): Payer: Medicare Other | Admitting: Internal Medicine

## 2015-06-30 VITALS — BP 102/72 | HR 73 | Temp 98.3°F | Ht 62.0 in | Wt 142.0 lb

## 2015-06-30 DIAGNOSIS — Z23 Encounter for immunization: Secondary | ICD-10-CM

## 2015-06-30 DIAGNOSIS — I10 Essential (primary) hypertension: Secondary | ICD-10-CM | POA: Diagnosis not present

## 2015-06-30 DIAGNOSIS — I5023 Acute on chronic systolic (congestive) heart failure: Secondary | ICD-10-CM

## 2015-06-30 DIAGNOSIS — R600 Localized edema: Secondary | ICD-10-CM

## 2015-06-30 DIAGNOSIS — N184 Chronic kidney disease, stage 4 (severe): Secondary | ICD-10-CM

## 2015-06-30 LAB — BASIC METABOLIC PANEL
BUN: 41 mg/dL — ABNORMAL HIGH (ref 6–23)
CHLORIDE: 107 meq/L (ref 96–112)
CO2: 22 meq/L (ref 19–32)
CREATININE: 1.68 mg/dL — AB (ref 0.40–1.20)
Calcium: 8.7 mg/dL (ref 8.4–10.5)
GFR: 30.26 mL/min — ABNORMAL LOW (ref >60.00)
Glucose, Bld: 90 mg/dL (ref 70–99)
Potassium: 4.5 mEq/L (ref 3.5–5.1)
Sodium: 139 mEq/L (ref 135–145)

## 2015-06-30 NOTE — Progress Notes (Signed)
Pre visit review using our clinic review tool, if applicable. No additional management support is needed unless otherwise documented below in the visit note. 

## 2015-06-30 NOTE — Patient Instructions (Signed)
Limit your sodium (Salt) intake  Cardiology/ heart failure clinic follow-up as scheduled  Return in one month for follow-up

## 2015-06-30 NOTE — Progress Notes (Signed)
Subjective:    Patient ID: Mary Guzman, female    DOB: [birth year], 81 y.o.   MRN: [id number]  HPI  Wt Readings from Last 3 Encounters:  06/30/15 142 lb (64.411 kg)  06/15/15 139 lb (63.05 kg)  06/01/15 137 lb (62.40 kg)     81 year old patient who is seen today for follow-up of congestive heart failure. Her chief complaint today is fatigue  Shortness of breath and dyspnea on exertion is improved.  Peripheral edema is unchanged   The patient has had a 2-D echocardiogram that revealed an ejection fraction in the 20-25% range.  Still  attempting to schedule a visit with the heart failure clinic.  Wt Readings from Last 3 Encounters:  06/30/15 142 lb (64.411 kg)  06/15/15 139 lb (63.05 kg)  06/01/15 137 lb (62.143 kg)   Past Medical History  Diagnosis Date  . GERD (gastroesophageal reflux disease)   . Thyroid disease     Hypothyroidism  . Osteoporosis   . DJD (degenerative joint disease)   . Cancer ([place])     Breast Hx of stage IIB, moderately differentiated with 3 of 8 possible lymph nodes.  . Hypertension     Social History   Social History  . Marital Status: Single    Spouse Name: N/A  . Number of Children: N/A  . Years of Education: N/A   Occupational History  . Not on file.   Social History Main Topics  . Smoking status: Never Smoker   . Smokeless tobacco: Current User    Types: Chew  . Alcohol Use: No  . Drug Use: Not on file  . Sexual Activity: Not on file   Other Topics Concern  . Not on file   Social History Narrative   Fairly active interaction with grandchildren.    Past Surgical History  Procedure Laterality Date  . Mastectomy  1994  . Ankle surgery      Right  . Intracapsular cataract extraction      Family History  Problem Relation Age of Onset  . Alcohol abuse Brother   . Asthma Son     No Known Allergies  Current Outpatient Prescriptions on File Prior to Visit  Medication Sig Dispense Refill  . acetaminophen (TYLENOL) 500 MG  tablet Take 500 mg by mouth every 6 (six) hours as needed.    Marland Kitchen aspirin 81 MG EC tablet Take 81 mg by mouth daily.      . Calcium Carbonate (CALCIUM 500 PO) Take 1 tablet by mouth daily.      . Cholecalciferol (VITAMIN D3) 5000 UNITS CAPS Take 1 capsule by mouth daily.    . furosemide (LASIX) 20 MG tablet Take 1 tablet (20 mg total) by mouth daily. (Patient taking differently: Take 20 mg by mouth as needed. ) 90 tablet 3  . levothyroxine (SYNTHROID, LEVOTHROID) 88 MCG tablet Take 1 tablet (88 mcg total) by mouth daily. 90 tablet 1  . lisinopril (PRINIVIL,ZESTRIL) 5 MG tablet Take 1 tablet (5 mg total) by mouth daily. 90 tablet 3  . lisinopril (PRINIVIL,ZESTRIL) 5 MG tablet TAKE 1 TABLET(5 MG) BY MOUTH DAILY 15 tablet 0  . metoprolol succinate (TOPROL-XL) 25 MG 24 hr tablet Take 0.5 tablets (12.5 mg total) by mouth daily. 90 tablet 3  . metoprolol succinate (TOPROL-XL) 25 MG 24 hr tablet Take 0.5 tablets (12.5 mg total) by mouth daily. 15 tablet 0  . Multiple Vitamins-Minerals (PRESERVISION AREDS 2 PO) Take 2 tablets by mouth 2 (two) times  daily.    . omeprazole (PRILOSEC) 20 MG capsule Take 20 mg by mouth as needed.      No current facility-administered medications on file prior to visit.    BP 102/72 mmHg  Pulse 73  Temp(Src) 98.3 F (36.8 C) (Oral)  Ht 5\' 2"  (1.575 m)  Wt 142 lb (64.411 kg)  BMI 25.97 kg/m2     Review of Systems  Constitutional: Positive for fatigue.  HENT: Negative for congestion, dental problem, hearing loss, rhinorrhea, sinus pressure, sore throat and tinnitus.   Eyes: Negative for pain, discharge and visual disturbance.  Respiratory: Negative for cough and shortness of breath.   Cardiovascular: Positive for leg swelling. Negative for chest pain and palpitations.  Gastrointestinal: Negative for nausea, vomiting, abdominal pain, diarrhea, constipation, blood in stool and abdominal distention.  Genitourinary: Negative for dysuria, urgency, frequency, hematuria,  flank pain, vaginal bleeding, vaginal discharge, difficulty urinating, vaginal pain and pelvic pain.  Musculoskeletal: Negative for joint swelling, arthralgias and gait problem.  Skin: Negative for rash.  Neurological: Positive for weakness. Negative for dizziness, syncope, speech difficulty, numbness and headaches.  Hematological: Negative for adenopathy.  Psychiatric/Behavioral: Negative for behavioral problems, dysphoric mood and agitation. The patient is not nervous/anxious.        Objective:   Physical Exam  Constitutional: She is oriented to person, place, and time. She appears well-developed and well-nourished.  HENT:  Head: Normocephalic.  Right Ear: External ear normal.  Left Ear: External ear normal.  Mouth/Throat: Oropharynx is clear and moist.  Eyes: Conjunctivae and EOM are normal. Pupils are equal, round, and reactive to light.  Neck: Normal range of motion. Neck supple. No thyromegaly present.  Cardiovascular: Normal rate, regular rhythm, normal heart sounds and intact distal pulses.   Pulmonary/Chest: Effort normal and breath sounds normal.  Decreased breath sounds at both bases, left greater than right, but otherwise clear  O2 saturation a remarkable 99%  Abdominal: Soft. Bowel sounds are normal. She exhibits no mass. There is no tenderness.  Musculoskeletal: Normal range of motion. She exhibits edema.  Lymphadenopathy:    She has no cervical adenopathy.  Neurological: She is alert and oriented to person, place, and time.  Skin: Skin is warm and dry. No rash noted.  Psychiatric: She has a normal mood and affect. Her behavior is normal.          Assessment & Plan:   Systolic heart failure Chronic kidney disease.  Will check a Bmet.  No change in therapy at this time Essential hypertension, stable  We'll attempt to obtain a urgent referral for the heart failure clinic

## 2015-07-04 ENCOUNTER — Encounter: Payer: Self-pay | Admitting: Hematology and Oncology

## 2015-07-04 ENCOUNTER — Telehealth: Payer: Self-pay | Admitting: Hematology and Oncology

## 2015-07-04 ENCOUNTER — Ambulatory Visit (HOSPITAL_BASED_OUTPATIENT_CLINIC_OR_DEPARTMENT_OTHER): Payer: Medicare Other | Admitting: Hematology and Oncology

## 2015-07-04 VITALS — BP 141/64 | HR 81 | Temp 97.9°F | Resp 18 | Ht 62.0 in | Wt 139.9 lb

## 2015-07-04 DIAGNOSIS — Z853 Personal history of malignant neoplasm of breast: Secondary | ICD-10-CM

## 2015-07-04 DIAGNOSIS — I5023 Acute on chronic systolic (congestive) heart failure: Secondary | ICD-10-CM | POA: Diagnosis not present

## 2015-07-04 NOTE — Telephone Encounter (Signed)
per pof to sch pt appt-gave pt copy of avs °

## 2015-07-05 NOTE — Progress Notes (Signed)
[facility] OFFICE PROGRESS NOTE  Patient Care Team: [name], MD as PCP - General  SUMMARY OF ONCOLOGIC HISTORY:  I have reviewed her chart extensively. She has a background history of left breast cancer, moderately differentiated, sized unknown but with involvement of 3/8 lymph nodes, ER positive, PR negative, HER-2/neu unknown. The patient underwent left breast modified radical mastectomy in 1994, followed by radiation therapy. She was placed on tamoxifen for 5 years. She was then placed on Evista, discontinue due to recent acute heart failure  INTERVAL HISTORY: Please see below for problem oriented charting. She denies any recent abnormal breast examination, palpable mass, abnormal breast appearance or nipple changes She has significant shortness of breath and diffuse bilateral lower extremity edema. Currently she is on high-dose diuretic therapy.  REVIEW OF SYSTEMS:   Constitutional: Denies fevers, chills or abnormal weight loss Eyes: Denies blurriness of vision Ears, nose, mouth, throat, and face: Denies mucositis or sore throat Gastrointestinal:  Denies nausea, heartburn or change in bowel habits Skin: Denies abnormal skin rashes Lymphatics: Denies new lymphadenopathy or easy bruising Neurological:Denies numbness, tingling or new weaknesses Behavioral/Psych: Mood is stable, no new changes  All other systems were reviewed with the patient and are negative.  I have reviewed the past medical history, past surgical history, social history and family history with the patient and they are unchanged from previous note.  ALLERGIES:  has No Known Allergies.  MEDICATIONS:  Current Outpatient Prescriptions  Medication Sig Dispense Refill  . Calcium Carbonate (CALCIUM 500 PO) Take 1 tablet by mouth daily.      . Cholecalciferol (VITAMIN D3) 5000 UNITS CAPS Take 1 capsule by mouth daily.    . furosemide (LASIX) 20 MG tablet Take 1 tablet (20 mg total) by mouth  daily. (Patient taking differently: Take 20 mg by mouth as needed. ) 90 tablet 3  . levothyroxine (SYNTHROID, LEVOTHROID) 88 MCG tablet Take 1 tablet (88 mcg total) by mouth daily. 90 tablet 1  . lisinopril (PRINIVIL,ZESTRIL) 5 MG tablet Take 1 tablet (5 mg total) by mouth daily. 90 tablet 3  . metoprolol succinate (TOPROL-XL) 25 MG 24 hr tablet Take 0.5 tablets (12.5 mg total) by mouth daily. 90 tablet 3  . Multiple Vitamins-Minerals (PRESERVISION AREDS 2 PO) Take 2 tablets by mouth 2 (two) times daily.    Marland Kitchen omeprazole (PRILOSEC) 20 MG capsule Take 20 mg by mouth as needed.     Marland Kitchen acetaminophen (TYLENOL) 500 MG tablet Take 500 mg by mouth every 6 (six) hours as needed.    Marland Kitchen aspirin 81 MG EC tablet Take 81 mg by mouth daily.       No current facility-administered medications for this visit.    PHYSICAL EXAMINATION: ECOG PERFORMANCE STATUS: 2 - Symptomatic, <50% confined to bed  Filed Vitals:   07/04/15 1046  BP: 141/64  Pulse: 81  Temp: 97.9 F (36.6 C)  Resp: 18   Filed Weights   07/04/15 1046  Weight: 139 lb 14.4 oz (63.458 kg)    GENERAL:alert, no distress and comfortable SKIN: skin color, texture, turgor are normal, no rashes or significant lesions EYES: normal, Conjunctiva are pink and non-injected, sclera clear OROPHARYNX:no exudate, no erythema and lips, buccal mucosa, and tongue normal  NECK: supple, thyroid normal size, non-tender, without nodularity LYMPH:  no palpable lymphadenopathy in the cervical, axillary or inguinal LUNGS: Noticed reduced breath sounds on both lung bases  HEART: regular rate & rhythm and no murmurs with moderate bilateral lower extremity  edema ABDOMEN:abdomen soft, non-tender and normal bowel sounds Musculoskeletal:no cyanosis of digits and no clubbing  NEURO: alert & oriented x 3 with fluent speech, no focal motor/sensory deficits Breasts exam revealed well-healed mastectomy scar on the left without any signs of cancer. Normal breast exam on the  right.  LABORATORY DATA:  I have reviewed the data as listed    Component Value Date/Time   NA 139 06/30/2015 1432   NA 140 06/02/2013 1347   K 4.5 06/30/2015 1432   K 5.8* 06/02/2013 1347   CL 107 06/30/2015 1432   CL 104 05/14/2012 1522   CO2 22 06/30/2015 1432   CO2 20* 06/02/2013 1347   GLUCOSE 90 06/30/2015 1432   GLUCOSE 97 06/02/2013 1347   GLUCOSE 91 05/14/2012 1522   BUN 41* 06/30/2015 1432   BUN 50.1* 06/02/2013 1347   CREATININE 1.68* 06/30/2015 1432   CREATININE 2.3* 06/02/2013 1347   CALCIUM 8.7 06/30/2015 1432   CALCIUM 8.8 06/02/2013 1347   PROT 6.6 06/01/2015 1701   PROT 6.9 06/02/2013 1347   ALBUMIN 3.6 06/01/2015 1701   ALBUMIN 3.6 06/02/2013 1347   AST 29 06/01/2015 1701   AST 26 06/02/2013 1347   ALT 18 06/01/2015 1701   ALT 14 06/02/2013 1347   ALKPHOS 102 06/01/2015 1701   ALKPHOS 54 06/02/2013 1347   BILITOT 0.3 06/01/2015 1701   BILITOT 0.23 06/02/2013 1347   GFRNONAA 45.29 12/06/2008 1026   GFRAA 46 12/08/2007 0959    No results found for: SPEP, UPEP  Lab Results  Component Value Date   WBC 7.1 06/01/2015   NEUTROABS 4.6 06/01/2015   HGB 10.2* 06/01/2015   HCT 32.3* 06/01/2015   MCV 77.6* 06/01/2015   PLT 416.0* 06/01/2015      Chemistry      Component Value Date/Time   NA 139 06/30/2015 1432   NA 140 06/02/2013 1347   K 4.5 06/30/2015 1432   K 5.8* 06/02/2013 1347   CL 107 06/30/2015 1432   CL 104 05/14/2012 1522   CO2 22 06/30/2015 1432   CO2 20* 06/02/2013 1347   BUN 41* 06/30/2015 1432   BUN 50.1* 06/02/2013 1347   CREATININE 1.68* 06/30/2015 1432   CREATININE 2.3* 06/02/2013 1347      Component Value Date/Time   CALCIUM 8.7 06/30/2015 1432   CALCIUM 8.8 06/02/2013 1347   ALKPHOS 102 06/01/2015 1701   ALKPHOS 54 06/02/2013 1347   AST 29 06/01/2015 1701   AST 26 06/02/2013 1347   ALT 18 06/01/2015 1701   ALT 14 06/02/2013 1347   BILITOT 0.3 06/01/2015 1701   BILITOT 0.23 06/02/2013 1347      ASSESSMENT &  PLAN:  BREAST CANCER, HX OF Clinically, she has no evidence of disease. I recommend consideration to discontinue mammogram screening. I agree with discontinuation of Evista and just follow clinically.   Acute on chronic systolic heart failure ([place]) She has clinical evidence of fluid overload and recent echocardiogram shows significant cardiomyopathy. She is currently on aggressive diuretic regimen and will follow with cardiologist in the new future. Continue medical management.   No orders of the defined types were placed in this encounter.   All questions were answered. The patient knows to call the clinic with any problems, questions or concerns. No barriers to learning was detected. I spent 15 minutes counseling the patient face to face. The total time spent in the appointment was 20 minutes and more than 50% was on counseling and review of  test results     [facility], Chrystian Cupples, MD 07/05/2015 7:27 AM

## 2015-07-05 NOTE — Assessment & Plan Note (Signed)
She has clinical evidence of fluid overload and recent echocardiogram shows significant cardiomyopathy. She is currently on aggressive diuretic regimen and will follow with cardiologist in the new future. Continue medical management.

## 2015-07-05 NOTE — Assessment & Plan Note (Signed)
Clinically, she has no evidence of disease. I recommend consideration to discontinue mammogram screening. I agree with discontinuation of Evista and just follow clinically.

## 2015-07-14 ENCOUNTER — Encounter (HOSPITAL_COMMUNITY): Payer: Self-pay

## 2015-07-14 ENCOUNTER — Ambulatory Visit (HOSPITAL_COMMUNITY)
Admission: RE | Admit: 2015-07-14 | Discharge: 2015-07-14 | Disposition: A | Discharge status: Home / Self Care | Payer: Medicare Other | Source: Ambulatory Visit | Attending: Cardiology | Admitting: Cardiology

## 2015-07-14 VITALS — BP 126/74 | HR 75 | Wt 133.5 lb

## 2015-07-14 DIAGNOSIS — Z8249 Family history of ischemic heart disease and other diseases of the circulatory system: Secondary | ICD-10-CM | POA: Insufficient documentation

## 2015-07-14 DIAGNOSIS — K219 Gastro-esophageal reflux disease without esophagitis: Secondary | ICD-10-CM | POA: Insufficient documentation

## 2015-07-14 DIAGNOSIS — Z7982 Long term (current) use of aspirin: Secondary | ICD-10-CM | POA: Insufficient documentation

## 2015-07-14 DIAGNOSIS — Z79899 Other long term (current) drug therapy: Secondary | ICD-10-CM | POA: Diagnosis not present

## 2015-07-14 DIAGNOSIS — I130 Hypertensive heart and chronic kidney disease with heart failure and stage 1 through stage 4 chronic kidney disease, or unspecified chronic kidney disease: Secondary | ICD-10-CM | POA: Insufficient documentation

## 2015-07-14 DIAGNOSIS — E039 Hypothyroidism, unspecified: Secondary | ICD-10-CM | POA: Insufficient documentation

## 2015-07-14 DIAGNOSIS — Z853 Personal history of malignant neoplasm of breast: Secondary | ICD-10-CM | POA: Insufficient documentation

## 2015-07-14 DIAGNOSIS — Z923 Personal history of irradiation: Secondary | ICD-10-CM | POA: Insufficient documentation

## 2015-07-14 DIAGNOSIS — N184 Chronic kidney disease, stage 4 (severe): Secondary | ICD-10-CM

## 2015-07-14 DIAGNOSIS — I5022 Chronic systolic (congestive) heart failure: Secondary | ICD-10-CM | POA: Diagnosis not present

## 2015-07-14 DIAGNOSIS — N183 Chronic kidney disease, stage 3 (moderate): Secondary | ICD-10-CM | POA: Insufficient documentation

## 2015-07-14 DIAGNOSIS — I509 Heart failure, unspecified: Secondary | ICD-10-CM | POA: Diagnosis present

## 2015-07-14 MED ORDER — POTASSIUM CHLORIDE ER 10 MEQ PO TBCR: 10.0000 meq | EXTENDED_RELEASE_TABLET | Freq: Every day | ORAL | Status: DC

## 2015-07-14 MED ORDER — FUROSEMIDE 20 MG PO TABS: 40.0000 mg | ORAL_TABLET | Freq: Every day | ORAL | Status: DC

## 2015-07-14 NOTE — Patient Instructions (Signed)
TAKE Lasix 40mg  (2 tablets) in the morning and 20mg  (1 tablet) in the evening for 4 DAYS... Then take Lasix 40mg  DAILY.  Start Potassium 41meq ( 1 tablet ) daily.   LABS in 7 days (bmet bnp)  FOLLOW UP: 10days

## 2015-07-16 DIAGNOSIS — I5022 Chronic systolic (congestive) heart failure: Secondary | ICD-10-CM | POA: Insufficient documentation

## 2015-07-16 NOTE — Progress Notes (Signed)
Patient ID: Mary Guzman, female   DOB: 06-01-1921, 81 y.o.   MRN: [id number] PCP: Dr Burnice Logan Cardiology: Dr. Aundra Dubin  81 yo with history of breast cancer in the 1990s, HTN, and CKD has developed congestive heart failure.  No prior known cardiac problems. She continues to live alone.  She dates her symptoms to around the time when she developed severe hip pain in 8/16.  She was under a lot of stress with the severe pain and ended up getting steroid injections.  She developed exertional dyspnea around that time.  However, for > 1 year she has had significant lower extremity edema.  She has gotten to the point where she is short of breath after walking about 15-20 feet.  She feels weak and is using a walker.  Denies orthopnea but lies on her side.  No PND.  No lightheadedness, syncope, or palpitations.  No chest pain.  CXR in 9/16 showed mild pulmonary edema.  She has been started on Lasix 20 mg daily.   ECG: NSR at 70, IVCD  Labs (9/16): pro-BNP > 5000, TSH normal Labs (10/16): K 4.5, creatinine 1.68  PMH: 1. Breast cancer: On left, s/p mastectomy in 1994 along with radiation treatment.  No chemotherapy.  2. Hypothyroidism. 3. OA 4. HTN 5. GERD 6. CKD 7. Chronic systolic CHF: Echo (123XX123) with EF 20-25%, diffuse hypokinesis, mild MR, moderately decreased RV systolic function, PASP 60 mmHg.   FH: Son with MI.  SH: Single, lives alone in [place], nonsmoker, no ETOH.   ROS: All systems reviewed and negative except as per HPI.   Current Outpatient Prescriptions  Medication Sig Dispense Refill  . acetaminophen (TYLENOL) 500 MG tablet Take 500 mg by mouth every 6 (six) hours as needed.    Marland Kitchen aspirin 81 MG EC tablet Take 81 mg by mouth daily.      . Calcium Carbonate (CALCIUM 500 PO) Take 1 tablet by mouth daily.      . Cholecalciferol (VITAMIN D3) 5000 UNITS CAPS Take 1 capsule by mouth daily.    . furosemide (LASIX) 20 MG tablet Take 2 tablets (40 mg total) by mouth daily. 60 tablet 3   . levothyroxine (SYNTHROID, LEVOTHROID) 88 MCG tablet Take 1 tablet (88 mcg total) by mouth daily. 90 tablet 1  . lisinopril (PRINIVIL,ZESTRIL) 5 MG tablet Take 1 tablet (5 mg total) by mouth daily. 90 tablet 3  . metoprolol succinate (TOPROL-XL) 25 MG 24 hr tablet Take 0.5 tablets (12.5 mg total) by mouth daily. 90 tablet 3  . Multiple Vitamins-Minerals (PRESERVISION AREDS 2 PO) Take 2 tablets by mouth 2 (two) times daily.    Marland Kitchen omeprazole (PRILOSEC) 20 MG capsule Take 20 mg by mouth as needed.     . potassium chloride (KLOR-CON 10) 10 MEQ tablet Take 1 tablet (10 mEq total) by mouth daily. 30 tablet 3   No current facility-administered medications for this encounter.   BP 126/74 mmHg  Pulse 75  Wt 133 lb 8 oz (60.555 kg)  SpO2 99% General: NAD Neck: JVP 14cm, no thyromegaly or thyroid nodule.  Lungs: Clear to auscultation bilaterally with normal respiratory effort. CV: Nondisplaced PMI.  Heart regular S1/S2, no S3/S4, 1/6 SEM RUSB.  2+ edema to knees bilaterally.  No carotid bruit.  Normal pedal pulses.  Abdomen: Soft, nontender, no hepatosplenomegaly, no distention.  Skin: Intact without lesions or rashes.  Neurologic: Alert and oriented x 3.  Psych: Normal affect. Extremities: No clubbing or cyanosis.  HEENT: Normal.  Assessment/Plan: 1. Chronic systolic CHF: EF 0000000 with moderate RV dysfunction on echo in 10/16.  She is volume overloaded on exam with NYHA class IIIb symptoms.  Etiology of the cardiomyopathy is uncertain: no chest pain or suggestion of CAD, however cannot rule out ischemic cardiomyopathy.  Symptoms noted at a time of significant stress with severe hip pain in 8/16, so cannot rule out Takotsubo CMP (but would typically expect EF to recover rather than stay low).  Viral myocarditis remains possible.  - Increase Lasix to 40 mg qam/20 mg qpm x 4 days then continue Lasix 40 mg daily.  Add KCl 10 daily.  BMET/BNP in 7 days.  - Continue current lisinopril and Toprol XL.   Will likely adjust at next appointment.  - With lack of chest pain, would hold off on coronary angiography given age and CKD III-IV.  2. CKD: Stage III-IV.  Will need to follow carefully with diuresis.  BMET to be repeated in 1 week.   Followup in 10 days to make sure fluid is coming off  [employer] 07/16/2015

## 2015-07-18 ENCOUNTER — Ambulatory Visit (INDEPENDENT_AMBULATORY_CARE_PROVIDER_SITE_OTHER): Payer: Medicare Other | Admitting: Internal Medicine

## 2015-07-18 ENCOUNTER — Encounter: Payer: Self-pay | Admitting: Internal Medicine

## 2015-07-18 VITALS — BP 130/72 | HR 76 | Temp 98.2°F | Resp 18 | Ht 62.0 in | Wt 136.0 lb

## 2015-07-18 DIAGNOSIS — I10 Essential (primary) hypertension: Secondary | ICD-10-CM

## 2015-07-18 DIAGNOSIS — I5023 Acute on chronic systolic (congestive) heart failure: Secondary | ICD-10-CM

## 2015-07-18 NOTE — Patient Instructions (Signed)
Limit your sodium (Salt) intake  Return in 6 months for follow-up  Cardiology followup as scheduled 

## 2015-07-18 NOTE — Progress Notes (Signed)
Pre visit review using our clinic review tool, if applicable. No additional management support is needed unless otherwise documented below in the visit note. 

## 2015-07-18 NOTE — Progress Notes (Signed)
Subjective:    Patient ID: Mary Guzman, female    DOB: 02/25/1921, 81 y.o.   MRN: [id number]  HPI  Wt Readings from Last 3 Encounters:  07/18/15 136 lb (61.689 kg)  07/14/15 133 lb 8 oz (60.555 kg)  07/04/15 139 lb 14.4 oz (63.72 kg)    81 year old patient who is seen today in follow-up.  She is now followed at the heart failure clinic for chronic systolic heart failure.  Her Lasix dose was uptitrated and if she is scheduled for follow-up lab tomorrow.  She feels much stronger today, but urinary frequency.  A problem.  Past Medical History  Diagnosis Date  . GERD (gastroesophageal reflux disease)   . Thyroid disease     Hypothyroidism  . Osteoporosis   . DJD (degenerative joint disease)   . Cancer ([place])     Breast Hx of stage IIB, moderately differentiated with 3 of 8 possible lymph nodes.  . Hypertension     Social History   Social History  . Marital Status: Single    Spouse Name: N/A  . Number of Children: N/A  . Years of Education: N/A   Occupational History  . Not on file.   Social History Main Topics  . Smoking status: Never Smoker   . Smokeless tobacco: Current User    Types: Chew  . Alcohol Use: No  . Drug Use: Not on file  . Sexual Activity: Not on file   Other Topics Concern  . Not on file   Social History Narrative   Fairly active interaction with grandchildren.    Past Surgical History  Procedure Laterality Date  . Mastectomy  1994  . Ankle surgery      Right  . Intracapsular cataract extraction      Family History  Problem Relation Age of Onset  . Alcohol abuse Brother   . Asthma Son     No Known Allergies  Current Outpatient Prescriptions on File Prior to Visit  Medication Sig Dispense Refill  . acetaminophen (TYLENOL) 500 MG tablet Take 500 mg by mouth every 6 (six) hours as needed.    Marland Kitchen aspirin 81 MG EC tablet Take 81 mg by mouth daily.      . Calcium Carbonate (CALCIUM 500 PO) Take 1 tablet by mouth daily.      .  Cholecalciferol (VITAMIN D3) 5000 UNITS CAPS Take 1 capsule by mouth daily.    . furosemide (LASIX) 20 MG tablet Take 2 tablets (40 mg total) by mouth daily. 60 tablet 3  . levothyroxine (SYNTHROID, LEVOTHROID) 88 MCG tablet Take 1 tablet (88 mcg total) by mouth daily. 90 tablet 1  . lisinopril (PRINIVIL,ZESTRIL) 5 MG tablet Take 1 tablet (5 mg total) by mouth daily. 90 tablet 3  . metoprolol succinate (TOPROL-XL) 25 MG 24 hr tablet Take 0.5 tablets (12.5 mg total) by mouth daily. 90 tablet 3  . Multiple Vitamins-Minerals (PRESERVISION AREDS 2 PO) Take 2 tablets by mouth 2 (two) times daily.    Marland Kitchen omeprazole (PRILOSEC) 20 MG capsule Take 20 mg by mouth as needed.     . potassium chloride (KLOR-CON 10) 10 MEQ tablet Take 1 tablet (10 mEq total) by mouth daily. 30 tablet 3   No current facility-administered medications on file prior to visit.    BP 130/72 mmHg  Pulse 76  Temp(Src) 98.2 F (36.8 C) (Oral)  Resp 18  Ht 5\' 2"  (1.575 m)  Wt 136 lb (61.689 kg)  BMI 24.87 kg/m2  Review of Systems  Constitutional: Positive for fatigue.  HENT: Negative for congestion, dental problem, hearing loss, rhinorrhea, sinus pressure, sore throat and tinnitus.   Eyes: Negative for pain, discharge and visual disturbance.  Respiratory: Negative for cough and shortness of breath.   Cardiovascular: Negative for chest pain, palpitations and leg swelling.  Gastrointestinal: Negative for nausea, vomiting, abdominal pain, diarrhea, constipation, blood in stool and abdominal distention.  Endocrine: Positive for polyuria.  Genitourinary: Negative for dysuria, urgency, frequency, hematuria, flank pain, vaginal bleeding, vaginal discharge, difficulty urinating, vaginal pain and pelvic pain.  Musculoskeletal: Negative for joint swelling, arthralgias and gait problem.  Skin: Negative for rash.  Neurological: Negative for dizziness, syncope, speech difficulty, weakness, numbness and headaches.  Hematological:  Negative for adenopathy.  Psychiatric/Behavioral: Negative for behavioral problems, dysphoric mood and agitation. The patient is not nervous/anxious.        Objective:   Physical Exam  Constitutional: She is oriented to person, place, and time. She appears well-developed and well-nourished.  HENT:  Head: Normocephalic.  Right Ear: External ear normal.  Left Ear: External ear normal.  Mouth/Throat: Oropharynx is clear and moist.  Eyes: Conjunctivae and EOM are normal. Pupils are equal, round, and reactive to light.  Neck: Normal range of motion. Neck supple. No thyromegaly present.  Prominent jugular venous pressure, with the patient sitting upright  Cardiovascular: Normal rate, regular rhythm, normal heart sounds and intact distal pulses.   Pulmonary/Chest: Effort normal and breath sounds normal.  Abdominal: Soft. Bowel sounds are normal. She exhibits no mass. There is no tenderness.  Musculoskeletal: Normal range of motion. She exhibits edema.  Lymphadenopathy:    She has no cervical adenopathy.  Neurological: She is alert and oriented to person, place, and time.  Skin: Skin is warm and dry. No rash noted.  Psychiatric: She has a normal mood and affect. Her behavior is normal.          Assessment & Plan:   Chronic systolic heart failure.  Symptomatically improved, although bothered by polyuria.  Follow-up cardiology Essential hypertension Chronic kidney disease  Follow-up 4-6 months

## 2015-07-19 ENCOUNTER — Ambulatory Visit (HOSPITAL_COMMUNITY)
Admission: RE | Admit: 2015-07-19 | Discharge: 2015-07-19 | Disposition: A | Discharge status: Home / Self Care | Payer: Medicare Other | Source: Ambulatory Visit | Attending: Cardiology | Admitting: Cardiology

## 2015-07-19 DIAGNOSIS — I5022 Chronic systolic (congestive) heart failure: Secondary | ICD-10-CM | POA: Diagnosis not present

## 2015-07-19 LAB — BASIC METABOLIC PANEL
ANION GAP: 12 (ref 5–15)
BUN: 47 mg/dL — ABNORMAL HIGH (ref 6–20)
CO2: 24 mmol/L (ref 22–32)
Calcium: 9.2 mg/dL (ref 8.9–10.3)
Chloride: 103 mmol/L (ref 101–111)
Creatinine, Ser: 1.67 mg/dL — ABNORMAL HIGH (ref 0.44–1.00)
GFR, EST AFRICAN AMERICAN: 30 mL/min — AB (ref >60)
GFR, EST NON AFRICAN AMERICAN: 25 mL/min — AB (ref >60)
Glucose, Bld: 96 mg/dL (ref 65–99)
POTASSIUM: 4.7 mmol/L (ref 3.5–5.1)
SODIUM: 139 mmol/L (ref 135–145)

## 2015-07-19 LAB — BRAIN NATRIURETIC PEPTIDE: B NATRIURETIC PEPTIDE 5: 3448.3 pg/mL — AB (ref 0.0–100.0)

## 2015-07-28 ENCOUNTER — Ambulatory Visit (HOSPITAL_COMMUNITY)
Admission: RE | Admit: 2015-07-28 | Discharge: 2015-07-28 | Disposition: A | Discharge status: Home / Self Care | Payer: Medicare Other | Source: Ambulatory Visit | Attending: Cardiology | Admitting: Cardiology

## 2015-07-28 ENCOUNTER — Encounter (HOSPITAL_COMMUNITY): Payer: Self-pay

## 2015-07-28 VITALS — BP 124/60 | HR 66 | Wt 130.4 lb

## 2015-07-28 DIAGNOSIS — Z923 Personal history of irradiation: Secondary | ICD-10-CM | POA: Diagnosis not present

## 2015-07-28 DIAGNOSIS — I429 Cardiomyopathy, unspecified: Secondary | ICD-10-CM | POA: Diagnosis not present

## 2015-07-28 DIAGNOSIS — N183 Chronic kidney disease, stage 3 unspecified: Secondary | ICD-10-CM

## 2015-07-28 DIAGNOSIS — K219 Gastro-esophageal reflux disease without esophagitis: Secondary | ICD-10-CM | POA: Diagnosis not present

## 2015-07-28 DIAGNOSIS — E039 Hypothyroidism, unspecified: Secondary | ICD-10-CM | POA: Insufficient documentation

## 2015-07-28 DIAGNOSIS — Z7982 Long term (current) use of aspirin: Secondary | ICD-10-CM | POA: Diagnosis not present

## 2015-07-28 DIAGNOSIS — Z853 Personal history of malignant neoplasm of breast: Secondary | ICD-10-CM | POA: Insufficient documentation

## 2015-07-28 DIAGNOSIS — Z8249 Family history of ischemic heart disease and other diseases of the circulatory system: Secondary | ICD-10-CM | POA: Insufficient documentation

## 2015-07-28 DIAGNOSIS — I5022 Chronic systolic (congestive) heart failure: Secondary | ICD-10-CM | POA: Diagnosis not present

## 2015-07-28 DIAGNOSIS — I130 Hypertensive heart and chronic kidney disease with heart failure and stage 1 through stage 4 chronic kidney disease, or unspecified chronic kidney disease: Secondary | ICD-10-CM | POA: Diagnosis not present

## 2015-07-28 DIAGNOSIS — Z79899 Other long term (current) drug therapy: Secondary | ICD-10-CM | POA: Insufficient documentation

## 2015-07-28 MED ORDER — METOPROLOL SUCCINATE ER 25 MG PO TB24: 25.0000 mg | ORAL_TABLET | Freq: Every day | ORAL | Status: DC

## 2015-07-28 MED ORDER — FUROSEMIDE 20 MG PO TABS: 40.0000 mg | ORAL_TABLET | Freq: Every morning | ORAL | Status: DC

## 2015-07-28 NOTE — Progress Notes (Signed)
Advanced Heart Failure Medication Review by a Pharmacist  Does the patient  feel that his/her medications are working for him/her?  yes  Has the patient been experiencing any side effects to the medications prescribed?  no  Does the patient measure his/her own blood pressure or blood glucose at home?  yes   Does the patient have any problems obtaining medications due to transportation or finances?   no  Understanding of regimen: good Understanding of indications: good Potential of compliance: good Patient understands to avoid NSAIDs. Patient understands to avoid decongestants.  Issues to address at subsequent visits: None   Pharmacist comments:  Mary Guzman is a pleasant 81 yo F presenting with her daughter and an up-to-date medication list. She reports excellent compliance with her medications and states that her daughter fills her pillbox weekly for her. She did not have any specific medication-related questions or concerns for me at this time.  [name] Velva Harman, PharmD, BCPS, CPP Clinical Pharmacist Pager: [phone] Phone: [phone] 07/28/2015 10:56 AM      Time with patient: 6 minutes Preparation and documentation time: 2 minutes Total time: 8 minutes

## 2015-07-28 NOTE — Patient Instructions (Signed)
Increase Metoprolol to 25 mg (1 tab) daily  Increase Furosemide (Lasix) to 40 mg (2 tabs) EVERY AM and take 20 mg (1 tab) every other day IN THE PM  Labs in 10 days  Your physician recommends that you schedule a follow-up appointment in: 2 months

## 2015-07-29 DIAGNOSIS — N183 Chronic kidney disease, stage 3 unspecified: Secondary | ICD-10-CM | POA: Insufficient documentation

## 2015-07-29 NOTE — Progress Notes (Signed)
Patient ID: Mary Guzman, female   DOB: 1921-06-19, 81 y.o.   MRN: [id number] PCP: Dr Burnice Logan Cardiology: Dr. Aundra Dubin  81 yo with history of breast cancer in the 1990s, HTN, and CKD has developed congestive heart failure.  No prior known cardiac problems. She continues to live alone.  She dates her symptoms to around the time when she developed severe hip pain in 8/16.  She was under a lot of stress with the severe pain and ended up getting steroid injections.  She developed exertional dyspnea around that time.  However, for > 1 year she has had significant lower extremity edema.  She got to the point where she is short of breath after walking about 15-20 feet.  No chest pain.  CXR in 9/16 showed mild pulmonary edema.   At last appointment, I increased her Lasix to 40 mg daily.  She has lost 3 lbs.  She is breathing better.  She still tires with activity around the house but is doing better.  She was able to walk into her appointment today using her walker without dyspnea.  She uses a cane in the house.  No orthopnea/PND.    Labs (9/16): pro-BNP > 5000, TSH normal Labs (10/16): K 4.5, creatinine 1.68 Labs (11/16): K 4.7, creatinine 1.67  PMH: 1. Breast cancer: On left, s/p mastectomy in 1994 along with radiation treatment.  No chemotherapy.  2. Hypothyroidism. 3. OA 4. HTN 5. GERD 6. CKD 7. Chronic systolic CHF: Echo (123XX123) with EF 20-25%, diffuse hypokinesis, mild MR, moderately decreased RV systolic function, PASP 60 mmHg.   FH: Son with MI.  SH: Single, lives alone in [place], nonsmoker, no ETOH.   ROS: All systems reviewed and negative except as per HPI.   Current Outpatient Prescriptions  Medication Sig Dispense Refill  . acetaminophen (TYLENOL) 500 MG tablet Take 500 mg by mouth every 6 (six) hours as needed for mild pain.     Marland Kitchen aspirin 81 MG EC tablet Take 81 mg by mouth daily as needed for pain.     . Cholecalciferol (VITAMIN D3) 5000 UNITS CAPS Take 1 capsule by mouth  daily.    . furosemide (LASIX) 20 MG tablet Take 2 tablets (40 mg total) by mouth every morning. Take 1 tab every other day in the PM 240 tablet 3  . levothyroxine (SYNTHROID, LEVOTHROID) 88 MCG tablet Take 1 tablet (88 mcg total) by mouth daily. 90 tablet 1  . lisinopril (PRINIVIL,ZESTRIL) 5 MG tablet Take 1 tablet (5 mg total) by mouth daily. 90 tablet 3  . metoprolol succinate (TOPROL-XL) 25 MG 24 hr tablet Take 1 tablet (25 mg total) by mouth daily. 90 tablet 3  . Multiple Vitamins-Minerals (PRESERVISION AREDS 2 PO) Take 2 tablets by mouth daily.     Marland Kitchen omeprazole (PRILOSEC) 20 MG capsule Take 20 mg by mouth as needed (heartburn).     . potassium chloride (KLOR-CON 10) 10 MEQ tablet Take 1 tablet (10 mEq total) by mouth daily. 30 tablet 3   No current facility-administered medications for this encounter.   BP 124/60 mmHg  Pulse 66  Wt 130 lb 6.4 oz (59.149 kg)  SpO2 100% General: NAD Neck: JVP 8 cm, no thyromegaly or thyroid nodule.  Lungs: Clear to auscultation bilaterally with normal respiratory effort. CV: Nondisplaced PMI.  Heart regular S1/S2 with widely split S2, no S3/S4, 1/6 SEM RUSB.  1+ edema to knees bilaterally.  No carotid bruit.  Normal pedal pulses.  Abdomen: Soft, nontender,  no hepatosplenomegaly, no distention.  Skin: Intact without lesions or rashes.  Neurologic: Alert and oriented x 3.  Psych: Normal affect. Extremities: No clubbing or cyanosis.  HEENT: Normal.   Assessment/Plan: 1. Chronic systolic CHF: EF 0000000 with moderate RV dysfunction on echo in 10/16.  Etiology of the cardiomyopathy is uncertain: no chest pain or suggestion of CAD, however cannot rule out ischemic cardiomyopathy.  Symptoms noted at a time of significant stress with severe hip pain in 8/16, so cannot rule out Takotsubo CMP (but would typically expect EF to recover rather than stay low).  Viral myocarditis remains possible.  Today, she is doing better symptomatically and has lost 3 lbs.  NYHA  class III symptoms.  Still some volume overload but improved.  - Continue Lasix 40 mg daily, but add 20 mg Lasix in the afternoon every other day.  BMET in 10 days.  - Continue current lisinopril and increase Toprol XL to 25 mg daily.  - With lack of chest pain, would hold off on coronary angiography given age and CKD III-IV.  - Wear graded compression stockings daily.  2. CKD: Stage III-IV.  Will need to follow carefully with diuresis.  BMET to be repeated in 10 days.  Followup in 2 months  Loralie Champagne 07/29/2015

## 2015-07-31 ENCOUNTER — Other Ambulatory Visit (HOSPITAL_COMMUNITY): Payer: Self-pay | Admitting: *Deleted

## 2015-08-09 ENCOUNTER — Ambulatory Visit (HOSPITAL_COMMUNITY)
Admission: RE | Admit: 2015-08-09 | Discharge: 2015-08-09 | Disposition: A | Discharge status: Home / Self Care | Payer: Medicare Other | Source: Ambulatory Visit | Attending: Internal Medicine | Admitting: Internal Medicine

## 2015-08-09 DIAGNOSIS — I5022 Chronic systolic (congestive) heart failure: Secondary | ICD-10-CM | POA: Diagnosis not present

## 2015-08-09 LAB — BASIC METABOLIC PANEL
ANION GAP: 8 (ref 5–15)
BUN: 86 mg/dL — ABNORMAL HIGH (ref 6–20)
CALCIUM: 9.4 mg/dL (ref 8.9–10.3)
CO2: 19 mmol/L — ABNORMAL LOW (ref 22–32)
CREATININE: 1.76 mg/dL — AB (ref 0.44–1.00)
Chloride: 112 mmol/L — ABNORMAL HIGH (ref 101–111)
GFR, EST AFRICAN AMERICAN: 28 mL/min — AB (ref >60)
GFR, EST NON AFRICAN AMERICAN: 24 mL/min — AB (ref >60)
Glucose, Bld: 99 mg/dL (ref 65–99)
Potassium: 5.8 mmol/L — ABNORMAL HIGH (ref 3.5–5.1)
SODIUM: 139 mmol/L (ref 135–145)

## 2015-08-15 ENCOUNTER — Ambulatory Visit: Payer: Medicare Other | Admitting: Internal Medicine

## 2015-08-16 ENCOUNTER — Ambulatory Visit (HOSPITAL_COMMUNITY)
Admission: RE | Admit: 2015-08-16 | Discharge: 2015-08-16 | Disposition: A | Discharge status: Home / Self Care | Payer: Medicare Other | Source: Ambulatory Visit | Attending: Cardiology | Admitting: Cardiology

## 2015-08-16 DIAGNOSIS — I5022 Chronic systolic (congestive) heart failure: Secondary | ICD-10-CM | POA: Diagnosis present

## 2015-08-16 LAB — BASIC METABOLIC PANEL
Anion gap: 9 (ref 5–15)
BUN: 90 mg/dL — AB (ref 6–20)
CHLORIDE: 107 mmol/L (ref 101–111)
CO2: 20 mmol/L — ABNORMAL LOW (ref 22–32)
CREATININE: 2.07 mg/dL — AB (ref 0.44–1.00)
Calcium: 8.9 mg/dL (ref 8.9–10.3)
GFR, EST AFRICAN AMERICAN: 23 mL/min — AB (ref >60)
GFR, EST NON AFRICAN AMERICAN: 20 mL/min — AB (ref >60)
Glucose, Bld: 107 mg/dL — ABNORMAL HIGH (ref 65–99)
POTASSIUM: 5.2 mmol/L — AB (ref 3.5–5.1)
SODIUM: 136 mmol/L (ref 135–145)

## 2015-08-21 ENCOUNTER — Telehealth (HOSPITAL_COMMUNITY): Payer: Self-pay | Admitting: *Deleted

## 2015-08-21 NOTE — Telephone Encounter (Signed)
-----   Message from Larey Dresser, MD sent at 08/18/2015  3:34 PM EST ----- Stop lisinopril.  Repeat BMET 10 days.

## 2015-08-21 NOTE — Telephone Encounter (Signed)
   Notes Recorded by Scarlette Calico, RN on 08/21/2015 at 4:23 PM Pt aware, agreeable and verbalizes understanding, repeat labs sch for 12/12

## 2015-08-28 ENCOUNTER — Ambulatory Visit (HOSPITAL_COMMUNITY)
Admission: RE | Admit: 2015-08-28 | Discharge: 2015-08-28 | Disposition: A | Discharge status: Home / Self Care | Payer: Medicare Other | Source: Ambulatory Visit | Attending: Cardiology | Admitting: Cardiology

## 2015-08-28 DIAGNOSIS — I5022 Chronic systolic (congestive) heart failure: Secondary | ICD-10-CM

## 2015-08-28 LAB — BASIC METABOLIC PANEL
ANION GAP: 9 (ref 5–15)
BUN: 96 mg/dL — ABNORMAL HIGH (ref 6–20)
CHLORIDE: 109 mmol/L (ref 101–111)
CO2: 21 mmol/L — ABNORMAL LOW (ref 22–32)
CREATININE: 1.86 mg/dL — AB (ref 0.44–1.00)
Calcium: 8.9 mg/dL (ref 8.9–10.3)
GFR calc non Af Amer: 22 mL/min — ABNORMAL LOW (ref >60)
GFR, EST AFRICAN AMERICAN: 26 mL/min — AB (ref >60)
Glucose, Bld: 88 mg/dL (ref 65–99)
Potassium: 4.9 mmol/L (ref 3.5–5.1)
SODIUM: 139 mmol/L (ref 135–145)

## 2015-09-19 ENCOUNTER — Ambulatory Visit (HOSPITAL_COMMUNITY)
Admission: RE | Admit: 2015-09-19 | Discharge: 2015-09-19 | Disposition: A | Discharge status: Home / Self Care | Payer: PPO | Source: Ambulatory Visit | Attending: Cardiology | Admitting: Cardiology

## 2015-09-19 ENCOUNTER — Encounter (HOSPITAL_COMMUNITY): Payer: Self-pay

## 2015-09-19 VITALS — BP 140/64 | HR 84 | Resp 20 | Wt 131.8 lb

## 2015-09-19 DIAGNOSIS — N183 Chronic kidney disease, stage 3 unspecified: Secondary | ICD-10-CM

## 2015-09-19 DIAGNOSIS — N184 Chronic kidney disease, stage 4 (severe): Secondary | ICD-10-CM | POA: Insufficient documentation

## 2015-09-19 DIAGNOSIS — I5022 Chronic systolic (congestive) heart failure: Secondary | ICD-10-CM | POA: Diagnosis not present

## 2015-09-19 LAB — BASIC METABOLIC PANEL
ANION GAP: 8 (ref 5–15)
BUN: 65 mg/dL — ABNORMAL HIGH (ref 6–20)
CALCIUM: 9 mg/dL (ref 8.9–10.3)
CO2: 23 mmol/L (ref 22–32)
Chloride: 109 mmol/L (ref 101–111)
Creatinine, Ser: 1.63 mg/dL — ABNORMAL HIGH (ref 0.44–1.00)
GFR, EST AFRICAN AMERICAN: 30 mL/min — AB (ref >60)
GFR, EST NON AFRICAN AMERICAN: 26 mL/min — AB (ref >60)
Glucose, Bld: 103 mg/dL — ABNORMAL HIGH (ref 65–99)
Potassium: 4.8 mmol/L (ref 3.5–5.1)
SODIUM: 140 mmol/L (ref 135–145)

## 2015-09-19 LAB — BRAIN NATRIURETIC PEPTIDE: B NATRIURETIC PEPTIDE 5: 573.2 pg/mL — AB (ref 0.0–100.0)

## 2015-09-19 MED ORDER — FUROSEMIDE 20 MG PO TABS: 40.0000 mg | ORAL_TABLET | Freq: Every morning | ORAL | Status: DC

## 2015-09-19 MED ORDER — METOPROLOL SUCCINATE ER 25 MG PO TB24: 25.0000 mg | ORAL_TABLET | Freq: Two times a day (BID) | ORAL | Status: DC

## 2015-09-19 NOTE — Progress Notes (Signed)
Patient ID: AVERYROSE MILD, female   DOB: 10-02-1920, 82 y.o.   MRN: [id number] PCP: Dr Burnice Logan Cardiology: Dr. Aundra Dubin  82 yo with history of breast cancer in the 1990s, HTN, and CKD has developed congestive heart failure.  No prior known cardiac problems. She continues to live alone.  She dates her symptoms to around the time when she developed severe hip pain in 8/16.  She was under a lot of stress with the severe pain and ended up getting steroid injections.  She developed exertional dyspnea around that time.  However, for > 1 year she has had significant lower extremity edema.  She got to the point where she was short of breath after walking about 15-20 feet.  No chest pain.  CXR in 9/16 showed mild pulmonary edema.   She is now taking Lasix 40 qam, 20 qod in the pm.  She is off lisinopril with elevated creatinine.  She generally is doing ok walking on flat ground, no significant dyspnea.  She uses a walker.  She is able to walk 15-20 minutes in stores.  She is short of breath with housework like making up her bed.  No chest pain, no orthopnea/PND, no lightheadedness or palpitations.  Weight is stable.  Of note, she developed a spinning sensation after getting out of bed today that resolved.   Labs (9/16): pro-BNP > 5000, TSH normal Labs (10/16): K 4.5, creatinine 1.68 Labs (11/16): K 4.7, creatinine 1.67 Labs (12/16): K 4.9, creatinine 1.86  PMH: 1. Breast cancer: On left, s/p mastectomy in 1994 along with radiation treatment.  No chemotherapy.  2. Hypothyroidism. 3. OA 4. HTN 5. GERD 6. CKD 7. Chronic systolic CHF: Echo (123XX123) with EF 20-25%, diffuse hypokinesis, mild MR, moderately decreased RV systolic function, PASP 60 mmHg.   FH: Son with MI.  SH: Single, lives alone in [place], nonsmoker, no ETOH.   ROS: All systems reviewed and negative except as per HPI.   Current Outpatient Prescriptions  Medication Sig Dispense Refill  . acetaminophen (TYLENOL) 500 MG tablet Take 500  mg by mouth every 6 (six) hours as needed for mild pain.     Marland Kitchen aspirin 81 MG EC tablet Take 81 mg by mouth daily as needed for pain.     . Cholecalciferol (VITAMIN D3) 5000 UNITS CAPS Take 1 capsule by mouth daily.    . furosemide (LASIX) 20 MG tablet Take 2 tablets (40 mg total) by mouth every morning. Take 1 tab every other day in the PM 240 tablet 3  . levothyroxine (SYNTHROID, LEVOTHROID) 88 MCG tablet Take 1 tablet (88 mcg total) by mouth daily. 90 tablet 1  . metoprolol succinate (TOPROL-XL) 25 MG 24 hr tablet Take 1 tablet (25 mg total) by mouth 2 (two) times daily. 180 tablet 3  . Multiple Vitamins-Minerals (PRESERVISION AREDS 2 PO) Take 2 tablets by mouth daily.     Marland Kitchen omeprazole (PRILOSEC) 20 MG capsule Take 20 mg by mouth as needed (heartburn).      No current facility-administered medications for this encounter.   BP 140/64 mmHg  Pulse 84  Resp 20  Wt 131 lb 12 oz (59.761 kg)  SpO2 98% General: NAD Neck: JVP 7 cm, no thyromegaly or thyroid nodule.  Lungs: Slight crackles at bases bilaterally.  CV: Nondisplaced PMI.  Heart regular S1/S2 with widely split S2, no S3/S4, 1/6 SEM RUSB.  1+ edema 1/2 to knees bilaterally.  No carotid bruit.    Abdomen: Soft, nontender, no hepatosplenomegaly,  no distention.  Skin: Intact without lesions or rashes.  Neurologic: Alert and oriented x 3.  Psych: Normal affect. Extremities: No clubbing or cyanosis.  HEENT: Normal.   Assessment/Plan: 1. Chronic systolic CHF: EF 0000000 with moderate RV dysfunction on echo in 10/16.  Etiology of the cardiomyopathy is uncertain: no chest pain or suggestion of CAD, however cannot rule out ischemic cardiomyopathy.  Symptoms noted at a time of significant stress with severe hip pain in 8/16, so cannot rule out Takotsubo CMP (but would typically expect EF to recover rather than stay low).  Viral myocarditis remains possible.  Today, she is stable.  NYHA class III symptoms.  Not significantly volume overloaded. -  Continue Lasix 40 mg daily with 20 mg every other day in the afternoon.  BMET/BNP today.  - Can increase Toprol XL to 25 mg bid.  - Stay off lisinopril with CKD III-IV.  - With lack of chest pain, would hold off on coronary angiography given age and CKD III-IV.  - Wear graded compression stockings daily.  2. CKD: Stage III-IV.  Will need to follow carefully with diuresis.  BMET today.   Followup in 3 months  Loralie Champagne 09/19/2015

## 2015-09-19 NOTE — Patient Instructions (Signed)
Routine lab work today. Will notify you of abnormal results, otherwise no news is good news!  INCREASE Toprol to 25 mg twice daily.  Follow up 3 months.  Do the following things EVERYDAY: 1) Weigh yourself in the morning before breakfast. Write it down and keep it in a log. 2) Take your medicines as prescribed 3) Eat low salt foods-Limit salt (sodium) to 2000 mg per day.  4) Stay as active as you can everyday 5) Limit all fluids for the day to less than 2 liters

## 2015-11-14 ENCOUNTER — Encounter: Payer: Medicare Other | Admitting: Internal Medicine

## 2016-01-16 ENCOUNTER — Encounter: Payer: Self-pay | Admitting: Internal Medicine

## 2016-01-16 ENCOUNTER — Ambulatory Visit (INDEPENDENT_AMBULATORY_CARE_PROVIDER_SITE_OTHER): Payer: PPO | Admitting: Internal Medicine

## 2016-01-16 VITALS — BP 140/68 | HR 76 | Temp 97.9°F | Resp 198 | Ht 62.0 in | Wt 142.0 lb

## 2016-01-16 DIAGNOSIS — N183 Chronic kidney disease, stage 3 unspecified: Secondary | ICD-10-CM

## 2016-01-16 DIAGNOSIS — M159 Polyosteoarthritis, unspecified: Secondary | ICD-10-CM

## 2016-01-16 DIAGNOSIS — E039 Hypothyroidism, unspecified: Secondary | ICD-10-CM

## 2016-01-16 DIAGNOSIS — I10 Essential (primary) hypertension: Secondary | ICD-10-CM

## 2016-01-16 DIAGNOSIS — M150 Primary generalized (osteo)arthritis: Secondary | ICD-10-CM

## 2016-01-16 DIAGNOSIS — I5023 Acute on chronic systolic (congestive) heart failure: Secondary | ICD-10-CM | POA: Diagnosis not present

## 2016-01-16 LAB — COMPREHENSIVE METABOLIC PANEL
ALBUMIN: 3.7 g/dL (ref 3.5–5.2)
ALK PHOS: 225 U/L — AB (ref 39–117)
ALT: 29 U/L (ref 0–35)
AST: 41 U/L — ABNORMAL HIGH (ref 0–37)
BILIRUBIN TOTAL: 0.3 mg/dL (ref 0.2–1.2)
BUN: 58 mg/dL — AB (ref 6–23)
CO2: 25 mEq/L (ref 19–32)
Calcium: 9.1 mg/dL (ref 8.4–10.5)
Chloride: 102 mEq/L (ref 96–112)
Creatinine, Ser: 2.11 mg/dL — ABNORMAL HIGH (ref 0.40–1.20)
GFR: 23.24 mL/min — ABNORMAL LOW (ref >60.00)
GLUCOSE: 92 mg/dL (ref 70–99)
POTASSIUM: 4.4 meq/L (ref 3.5–5.1)
SODIUM: 135 meq/L (ref 135–145)
TOTAL PROTEIN: 7.2 g/dL (ref 6.0–8.3)

## 2016-01-16 LAB — CBC WITH DIFFERENTIAL/PLATELET
BASOS ABS: 0.1 10*3/uL (ref 0.0–0.1)
Basophils Relative: 0.6 % (ref 0.0–3.0)
EOS ABS: 0.3 10*3/uL (ref 0.0–0.7)
EOS PCT: 3 % (ref 0.0–5.0)
HCT: 32.4 % — ABNORMAL LOW (ref 36.0–46.0)
HEMOGLOBIN: 10.6 g/dL — AB (ref 12.0–15.0)
LYMPHS ABS: 1.9 10*3/uL (ref 0.7–4.0)
Lymphocytes Relative: 21.2 % (ref 12.0–46.0)
MCHC: 32.9 g/dL (ref 30.0–36.0)
MCV: 85.2 fl (ref 78.0–100.0)
MONO ABS: 0.7 10*3/uL (ref 0.1–1.0)
Monocytes Relative: 8 % (ref 3.0–12.0)
NEUTROS PCT: 67.2 % (ref 43.0–77.0)
Neutro Abs: 6.1 10*3/uL (ref 1.4–7.7)
Platelets: 371 10*3/uL (ref 150.0–400.0)
RBC: 3.8 Mil/uL — AB (ref 3.87–5.11)
RDW: 14.6 % (ref 11.5–15.5)
WBC: 9.1 10*3/uL (ref 4.0–10.5)

## 2016-01-16 LAB — TSH: TSH: 0.98 u[IU]/mL (ref 0.35–4.50)

## 2016-01-16 LAB — BRAIN NATRIURETIC PEPTIDE: Pro B Natriuretic peptide (BNP): 373 pg/mL — ABNORMAL HIGH (ref 0.0–100.0)

## 2016-01-16 NOTE — Progress Notes (Signed)
Pre visit review using our clinic review tool, if applicable. No additional management support is needed unless otherwise documented below in the visit note. 

## 2016-01-16 NOTE — Patient Instructions (Signed)
Limit your sodium (Salt) intake  Cardiology follow-up as scheduled  Take an additional furosemide after lunch today and tomorrow only  Return in 6 months for follow-up

## 2016-01-16 NOTE — Progress Notes (Signed)
Subjective:    Patient ID: Mary Guzman, female    DOB: [birth year], 82 y.o.   MRN: [id number]  HPI 82 year old patient who is seen today for her biannual follow-up.  She is scheduled to see cardiology in the heart failure clinic in 2 days Her only complaint today is fatigue.  Apparently this is intermittent but has no energy, perhaps 2 days out of the week.  Since her last visit here, there has been some weight gain and worsening lower extremity edema.  Denies any dyspareunia  Wt Readings from Last 3 Encounters:  01/16/16 142 lb (64.411 kg)  09/19/15 131 lb 12 oz (59.761 kg)  07/28/15 130 lb 6.4 oz (59.149 kg)   Past Medical History  Diagnosis Date  . GERD (gastroesophageal reflux disease)   . Thyroid disease     Hypothyroidism  . Osteoporosis   . DJD (degenerative joint disease)   . Cancer ([place])     Breast Hx of stage IIB, moderately differentiated with 3 of 8 possible lymph nodes.  . Hypertension      Social History   Social History  . Marital Status: Single    Spouse Name: N/A  . Number of Children: N/A  . Years of Education: N/A   Occupational History  . Not on file.   Social History Main Topics  . Smoking status: Never Smoker   . Smokeless tobacco: Current User    Types: Chew  . Alcohol Use: No  . Drug Use: Not on file  . Sexual Activity: Not on file   Other Topics Concern  . Not on file   Social History Narrative   Fairly active interaction with grandchildren.    Past Surgical History  Procedure Laterality Date  . Mastectomy  1994  . Ankle surgery      Right  . Intracapsular cataract extraction      Family History  Problem Relation Age of Onset  . Alcohol abuse Brother   . Asthma Son     No Known Allergies  Current Outpatient Prescriptions on File Prior to Visit  Medication Sig Dispense Refill  . acetaminophen (TYLENOL) 500 MG tablet Take 500 mg by mouth every 6 (six) hours as needed for mild pain.     . Cholecalciferol (VITAMIN D3) 5000  UNITS CAPS Take 1 capsule by mouth daily.    . furosemide (LASIX) 20 MG tablet Take 2 tablets (40 mg total) by mouth every morning. Take 1 tab every other day in the PM 240 tablet 3  . levothyroxine (SYNTHROID, LEVOTHROID) 88 MCG tablet Take 1 tablet (88 mcg total) by mouth daily. 90 tablet 1  . metoprolol succinate (TOPROL-XL) 25 MG 24 hr tablet Take 1 tablet (25 mg total) by mouth 2 (two) times daily. 180 tablet 3  . Multiple Vitamins-Minerals (PRESERVISION AREDS 2 PO) Take 2 tablets by mouth daily.     Marland Kitchen omeprazole (PRILOSEC) 20 MG capsule Take 20 mg by mouth as needed (heartburn).      No current facility-administered medications on file prior to visit.    BP 140/68 mmHg  Pulse 76  Temp(Src) 97.9 F (36.6 C) (Oral)  Resp 198  Ht 5\' 2"  (1.575 m)  Wt 142 lb (64.411 kg)  BMI 25.97 kg/m2      Review of Systems  Constitutional: Positive for fatigue.  HENT: Negative for congestion, dental problem, hearing loss, rhinorrhea, sinus pressure, sore throat and tinnitus.   Eyes: Negative for pain, discharge and visual disturbance.  Respiratory:  Negative for cough and shortness of breath.   Cardiovascular: Positive for leg swelling. Negative for chest pain and palpitations.  Gastrointestinal: Negative for nausea, vomiting, abdominal pain, diarrhea, constipation, blood in stool and abdominal distention.  Genitourinary: Negative for dysuria, urgency, frequency, hematuria, flank pain, vaginal bleeding, vaginal discharge, difficulty urinating, vaginal pain and pelvic pain.  Musculoskeletal: Negative for joint swelling, arthralgias and gait problem.  Skin: Negative for rash.  Neurological: Negative for dizziness, syncope, speech difficulty, weakness, numbness and headaches.  Hematological: Negative for adenopathy.  Psychiatric/Behavioral: Negative for behavioral problems, dysphoric mood and agitation. The patient is not nervous/anxious.        Objective:   Physical Exam  Constitutional: She  is oriented to person, place, and time. She appears well-developed and well-nourished.  Repeat blood pressure 130/70  HENT:  Head: Normocephalic.  Right Ear: External ear normal.  Left Ear: External ear normal.  Mouth/Throat: Oropharynx is clear and moist.  Eyes: Conjunctivae and EOM are normal. Pupils are equal, round, and reactive to light.  Neck: Normal range of motion. Neck supple. No JVD present. No thyromegaly present.  No JVD  Cardiovascular: Normal rate, regular rhythm, normal heart sounds and intact distal pulses.   Pulmonary/Chest: Effort normal and breath sounds normal. No respiratory distress. She has no wheezes. She has no rales.  Kyphosis  Clear  Abdominal: Soft. Bowel sounds are normal. She exhibits no mass. There is no tenderness.  Musculoskeletal: Normal range of motion. She exhibits edema.  Plus 2 lower extremity edema, right slightly greater than the left  Lymphadenopathy:    She has no cervical adenopathy.  Neurological: She is alert and oriented to person, place, and time.  Skin: Skin is warm and dry. No rash noted.  Psychiatric: She has a normal mood and affect. Her behavior is normal.          Assessment & Plan:   Chronic systolic heart failure.  Will give an additional Lasix today and tomorrow.  Cardiology follow-up in 2 days.  Check lab  Fatigue.  We'll also check a CBC, electrolytes, and TSH  Chronic kidney disease.  Check indices Hypothyroidism.  Check TSH

## 2016-01-18 ENCOUNTER — Ambulatory Visit (HOSPITAL_COMMUNITY)
Admission: RE | Admit: 2016-01-18 | Discharge: 2016-01-18 | Disposition: A | Discharge status: Home / Self Care | Payer: PPO | Source: Ambulatory Visit | Attending: Cardiology | Admitting: Cardiology

## 2016-01-18 ENCOUNTER — Encounter (HOSPITAL_COMMUNITY): Payer: Self-pay

## 2016-01-18 VITALS — BP 126/64 | HR 64 | Wt 138.0 lb

## 2016-01-18 DIAGNOSIS — I5022 Chronic systolic (congestive) heart failure: Secondary | ICD-10-CM | POA: Diagnosis not present

## 2016-01-18 DIAGNOSIS — E039 Hypothyroidism, unspecified: Secondary | ICD-10-CM | POA: Diagnosis not present

## 2016-01-18 DIAGNOSIS — I130 Hypertensive heart and chronic kidney disease with heart failure and stage 1 through stage 4 chronic kidney disease, or unspecified chronic kidney disease: Secondary | ICD-10-CM | POA: Insufficient documentation

## 2016-01-18 DIAGNOSIS — Z8249 Family history of ischemic heart disease and other diseases of the circulatory system: Secondary | ICD-10-CM | POA: Diagnosis not present

## 2016-01-18 DIAGNOSIS — Z79899 Other long term (current) drug therapy: Secondary | ICD-10-CM | POA: Diagnosis not present

## 2016-01-18 DIAGNOSIS — N183 Chronic kidney disease, stage 3 unspecified: Secondary | ICD-10-CM

## 2016-01-18 DIAGNOSIS — M1990 Unspecified osteoarthritis, unspecified site: Secondary | ICD-10-CM | POA: Diagnosis not present

## 2016-01-18 DIAGNOSIS — Z923 Personal history of irradiation: Secondary | ICD-10-CM | POA: Diagnosis not present

## 2016-01-18 DIAGNOSIS — Z853 Personal history of malignant neoplasm of breast: Secondary | ICD-10-CM | POA: Insufficient documentation

## 2016-01-18 DIAGNOSIS — K219 Gastro-esophageal reflux disease without esophagitis: Secondary | ICD-10-CM | POA: Insufficient documentation

## 2016-01-18 NOTE — Patient Instructions (Signed)
Please wear your compression stockings.  Take Lasix 40mg  in the morning and 40mg  in the evening for 2 days.  Then resume regular dose of Lasix (40mg  in the morning and 20mg  in the evening)  Follow up in 1 week.

## 2016-01-19 NOTE — Progress Notes (Signed)
Patient ID: Mary Guzman, female   DOB: 02-01-1921, 82 y.o.   MRN: [id number] PCP: Dr Burnice Logan Cardiology: Dr. Aundra Dubin  82 yo with history of breast cancer in the 1990s, HTN, and CKD has developed congestive heart failure.  No prior known cardiac problems. She continues to live alone.  She dates her symptoms to around the time when she developed severe hip pain in 8/16.  She was under a lot of stress with the severe pain and ended up getting steroid injections.  She developed exertional dyspnea around that time.  However, for > 1 year she has had significant lower extremity edema.  She got to the point where she was short of breath after walking about 15-20 feet.  No chest pain.  CXR in 9/16 showed mild pulmonary edema.  This improved with increase in Lasix to 40 qam and every other day 20 mg pm Lasix.   Recently, she has built up fluid again.  Weight is up 7 lbs.  She is more fatigued.  No dyspnea walking in house with walker but she is dyspneic with longer distances like walking into the office today.  Significantly worsened lower extremity edema.  No orthopnea/PND.  No lightheadedness or chest pain.  She lives by herself.  Unable to get compression stockings on. Of note, creatinine has been higher recently.      Labs (9/16): pro-BNP > 5000, TSH normal Labs (10/16): K 4.5, creatinine 1.68 Labs (11/16): K 4.7, creatinine 1.67 Labs (12/16): K 4.9, creatinine 1.86 Labs (5/17): K 4.9, creatinine 2.1, BNP 373, TSH 0.98 (normal)  PMH: 1. Breast cancer: On left, s/p mastectomy in 1994 along with radiation treatment.  No chemotherapy.  2. Hypothyroidism. 3. OA 4. HTN 5. GERD 6. 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  7. Chronic systolic CHF: Echo (123XX123) with EF 20-25%, diffuse hypokinesis, mild MR, moderately decreased RV systolic function, PASP 60 mmHg.   FH: Son with MI.  SH: Single, lives alone in [place], nonsmoker, no ETOH.   ROS: All systems reviewed and negative except as per HPI.   Current Outpatient  Prescriptions  Medication Sig Dispense Refill  . acetaminophen (TYLENOL) 500 MG tablet Take 500 mg by mouth every 6 (six) hours as needed for mild pain.     . Calcium Carb-Cholecalciferol (CALCIUM 600+D) 600-800 MG-UNIT TABS Take 1 tablet by mouth 2 (two) times daily.    . Cholecalciferol (VITAMIN D3) 5000 UNITS CAPS Take 1 capsule by mouth daily.    . furosemide (LASIX) 20 MG tablet Take 2 tablets (40 mg total) by mouth every morning. Take 1 tab every other day in the PM 240 tablet 3  . levothyroxine (SYNTHROID, LEVOTHROID) 88 MCG tablet Take 1 tablet (88 mcg total) by mouth daily. 90 tablet 1  . metoprolol succinate (TOPROL-XL) 25 MG 24 hr tablet Take 1 tablet (25 mg total) by mouth 2 (two) times daily. 180 tablet 3  . Multiple Vitamins-Minerals (PRESERVISION AREDS 2 PO) Take 2 tablets by mouth daily.     Marland Kitchen omeprazole (PRILOSEC) 20 MG capsule Take 20 mg by mouth as needed (heartburn).     . Probiotic Product (PROBIOTIC ADVANCED PO) Take 500 mg by mouth.     No current facility-administered medications for this encounter.   BP 126/64 mmHg  Pulse 64  Wt 138 lb (62.596 kg)  SpO2 98% General: NAD Neck: JVP 8-9 cm, no thyromegaly or thyroid nodule.  Lungs: CTAB  CV: Nondisplaced PMI.  Heart regular S1/S2 with widely split S2, no S3/S4,  1/6 SEM RUSB.  2+ edema to knees bilaterally.  No carotid bruit.    Abdomen: Soft, nontender, no hepatosplenomegaly, no distention.  Skin: Intact without lesions or rashes.  Neurologic: Alert and oriented x 3.  Psych: Normal affect. Extremities: No clubbing or cyanosis.  HEENT: Normal.   Assessment/Plan: 1. Chronic systolic CHF: EF 0000000 with moderate RV dysfunction on echo in 10/16.  Etiology of the cardiomyopathy is uncertain: no chest pain or suggestion of CAD, however cannot rule out ischemic cardiomyopathy.  Symptoms noted at a time of significant stress with severe hip pain in 8/16, so cannot rule out Takotsubo CMP (but would typically expect EF to  recover rather than stay low).  Viral myocarditis remains possible.  She is more volume overloaded than in the past with rise in weight.  NYHA class IIIb symptoms.  This is complicated by recent rise in creatinine. - Increase Lasix to 40 mg bid x 2 days, then 40 qam/20 qpm.  - Continue Toprol XL 25 mg bid.  - Stay off lisinopril with CKD III-IV.  - Wear graded compression stockings daily => she will try to get the type with zippers.   2. CKD: Stage III-IV.  Will need to follow carefully with diuresis.  BMET in 1 week.  Followup in 1 week.   Loralie Champagne 01/19/2016

## 2016-01-25 ENCOUNTER — Ambulatory Visit (HOSPITAL_COMMUNITY)
Admission: RE | Admit: 2016-01-25 | Discharge: 2016-01-25 | Disposition: A | Discharge status: Home / Self Care | Payer: PPO | Source: Ambulatory Visit | Attending: Internal Medicine | Admitting: Internal Medicine

## 2016-01-25 ENCOUNTER — Other Ambulatory Visit (HOSPITAL_COMMUNITY): Payer: Self-pay | Admitting: *Deleted

## 2016-01-25 VITALS — BP 120/78 | HR 98 | Wt 136.0 lb

## 2016-01-25 DIAGNOSIS — M1990 Unspecified osteoarthritis, unspecified site: Secondary | ICD-10-CM | POA: Insufficient documentation

## 2016-01-25 DIAGNOSIS — Z853 Personal history of malignant neoplasm of breast: Secondary | ICD-10-CM | POA: Diagnosis not present

## 2016-01-25 DIAGNOSIS — I429 Cardiomyopathy, unspecified: Secondary | ICD-10-CM | POA: Diagnosis not present

## 2016-01-25 DIAGNOSIS — I130 Hypertensive heart and chronic kidney disease with heart failure and stage 1 through stage 4 chronic kidney disease, or unspecified chronic kidney disease: Secondary | ICD-10-CM | POA: Diagnosis not present

## 2016-01-25 DIAGNOSIS — I5022 Chronic systolic (congestive) heart failure: Secondary | ICD-10-CM | POA: Diagnosis not present

## 2016-01-25 DIAGNOSIS — K219 Gastro-esophageal reflux disease without esophagitis: Secondary | ICD-10-CM | POA: Diagnosis not present

## 2016-01-25 DIAGNOSIS — Z79899 Other long term (current) drug therapy: Secondary | ICD-10-CM | POA: Diagnosis not present

## 2016-01-25 DIAGNOSIS — Z923 Personal history of irradiation: Secondary | ICD-10-CM | POA: Insufficient documentation

## 2016-01-25 DIAGNOSIS — N183 Chronic kidney disease, stage 3 unspecified: Secondary | ICD-10-CM

## 2016-01-25 DIAGNOSIS — H353123 Nonexudative age-related macular degeneration, left eye, advanced atrophic without subfoveal involvement: Secondary | ICD-10-CM | POA: Diagnosis not present

## 2016-01-25 DIAGNOSIS — H353114 Nonexudative age-related macular degeneration, right eye, advanced atrophic with subfoveal involvement: Secondary | ICD-10-CM | POA: Diagnosis not present

## 2016-01-25 DIAGNOSIS — Z8249 Family history of ischemic heart disease and other diseases of the circulatory system: Secondary | ICD-10-CM | POA: Insufficient documentation

## 2016-01-25 DIAGNOSIS — Z9012 Acquired absence of left breast and nipple: Secondary | ICD-10-CM | POA: Diagnosis not present

## 2016-01-25 DIAGNOSIS — E039 Hypothyroidism, unspecified: Secondary | ICD-10-CM | POA: Diagnosis not present

## 2016-01-25 LAB — BASIC METABOLIC PANEL
ANION GAP: 13 (ref 5–15)
BUN: 81 mg/dL — ABNORMAL HIGH (ref 6–20)
CALCIUM: 9 mg/dL (ref 8.9–10.3)
CHLORIDE: 104 mmol/L (ref 101–111)
CO2: 23 mmol/L (ref 22–32)
Creatinine, Ser: 2.37 mg/dL — ABNORMAL HIGH (ref 0.44–1.00)
GFR calc non Af Amer: 17 mL/min — ABNORMAL LOW (ref >60)
GFR, EST AFRICAN AMERICAN: 19 mL/min — AB (ref >60)
Glucose, Bld: 99 mg/dL (ref 65–99)
POTASSIUM: 4.5 mmol/L (ref 3.5–5.1)
Sodium: 140 mmol/L (ref 135–145)

## 2016-01-25 MED ORDER — FUROSEMIDE 20 MG PO TABS: 40.0000 mg | ORAL_TABLET | Freq: Every day | ORAL | Status: DC

## 2016-01-25 NOTE — Patient Instructions (Signed)
Labs today  Your physician recommends that you schedule a follow-up appointment in: 6 weeks with Dr.McLean  Do the following things EVERYDAY: 1) Weigh yourself in the morning before breakfast. Write it down and keep it in a log. 2) Take your medicines as prescribed 3) Eat low salt foods-Limit salt (sodium) to 2000 mg per day.  4) Stay as active as you can everyday 5) Limit all fluids for the day to less than 2 liters 6)

## 2016-01-25 NOTE — Progress Notes (Signed)
Patient ID: Mary Guzman, female   DOB: July 12, 1921, 82 y.o.   MRN: [id number] PCP: Dr Burnice Logan Cardiology: Dr. Aundra Dubin  82 yo with history of breast cancer in the 1990s, HTN, and CKD has developed congestive heart failure.  No prior known cardiac problems. She continues to live alone.  She dates her symptoms to around the time when she developed severe hip pain in 8/16.  She was under a lot of stress with the severe pain and ended up getting steroid injections.  She developed exertional dyspnea around that time.  However, for > 1 year she has had significant lower extremity edema.  She got to the point where she was short of breath after walking about 15-20 feet.  No chest pain.  CXR in 9/16 showed mild pulmonary edema.  This improved with increase in Lasix to 40 qam and every other day 20 mg pm Lasix.   Today she returns for HF follow up. Last visit lasix was increased for a few days. Weight at home 134-136 pounds. Overall feeling ok. Denies SOB/PND/Orthopnea. Taking all medications. Does not want to wear compression stockings.   Labs (9/16): pro-BNP > 5000, TSH normal Labs (10/16): K 4.5, creatinine 1.68 Labs (11/16): K 4.7, creatinine 1.67 Labs (12/16): K 4.9, creatinine 1.86 Labs (5/17): K 4.9, creatinine 2.1, BNP 373, TSH 0.98 (normal)  PMH: 1. Breast cancer: On left, s/p mastectomy in 1994 along with radiation treatment.  No chemotherapy.  2. Hypothyroidism. 3. OA 4. HTN 5. GERD 6. 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  7. Chronic systolic CHF: Echo (123XX123) with EF 20-25%, diffuse hypokinesis, mild MR, moderately decreased RV systolic function, PASP 60 mmHg.   FH: Son with MI.  SH: Single, lives alone in [place], nonsmoker, no ETOH.   ROS: All systems reviewed and negative except as per HPI.   Current Outpatient Prescriptions  Medication Sig Dispense Refill  . acetaminophen (TYLENOL) 500 MG tablet Take 500 mg by mouth every 6 (six) hours as needed for mild pain.     . Calcium Carb-Cholecalciferol  (CALCIUM 600+D) 600-800 MG-UNIT TABS Take 1 tablet by mouth 2 (two) times daily.    . calcium carbonate (TUMS - DOSED IN MG ELEMENTAL CALCIUM) 500 MG chewable tablet Chew 1 tablet by mouth daily as needed for indigestion or heartburn.    . carboxymethylcellulose (REFRESH PLUS) 0.5 % SOLN Place 1 drop into both eyes 3 (three) times daily as needed (dry eye).    . Cholecalciferol (VITAMIN D3) 5000 UNITS CAPS Take 1 capsule by mouth daily.    . furosemide (LASIX) 20 MG tablet Take 20-40 mg by mouth 2 (two) times daily. Take 40 mg (2 tablets) in the morning and 20 mg (1 tablet) in the afternoon    . levothyroxine (SYNTHROID, LEVOTHROID) 88 MCG tablet Take 1 tablet (88 mcg total) by mouth daily. 90 tablet 1  . loperamide (IMODIUM A-D) 2 MG tablet Take 2 mg by mouth 4 (four) times daily as needed for diarrhea or loose stools.    . metoprolol succinate (TOPROL-XL) 25 MG 24 hr tablet Take 1 tablet (25 mg total) by mouth 2 (two) times daily. 180 tablet 3  . Multiple Vitamins-Minerals (PRESERVISION AREDS 2 PO) Take 2 tablets by mouth daily.     . Probiotic Product (PROBIOTIC ADVANCED PO) Take 500 mg by mouth daily.      No current facility-administered medications for this encounter.   BP 120/78 mmHg  Pulse 98  Wt 136 lb (61.689 kg)  SpO2 95%  General: NAD Neck: JVP 6-7cm, no thyromegaly or thyroid nodule.  Lungs: CTAB  CV: Nondisplaced PMI.  Heart regular S1/S2 with widely split S2, no S3/S4, 1/6 SEM RUSB.  1+ edema mid RLE and LLE.   No carotid bruit.    Abdomen: Soft, nontender, no hepatosplenomegaly, no distention.  Skin: Intact without lesions or rashes.  Neurologic: Alert and oriented x 3.  Psych: Normal affect. Extremities: No clubbing or cyanosis.  HEENT: Normal.   Assessment/Plan: 1. Chronic systolic CHF: EF 0000000 with moderate RV dysfunction on echo in 10/16.  Etiology of the cardiomyopathy is uncertain: no chest pain or suggestion of CAD, however cannot rule out ischemic cardiomyopathy.   Symptoms noted at a time of significant stress with severe hip pain in 8/16, so cannot rule out Takotsubo CMP (but would typically expect EF to recover rather than stay low).  Viral myocarditis remains possible.   NYHA III. Volume status ok. Continue lasix 40 mg/20 qpm. BMET today. - Continue Toprol XL 25 mg bid.  - Stay off lisinopril with CKD III-IV.  - She is unable to wear compression stockings.    2. CKD: Stage III-IV.  Will need to follow carefully with diuresis.  BMET today  Followup in  6 weeks with Dr Aundra Dubin.    Amy Clegg 01/25/2016

## 2016-01-25 NOTE — Progress Notes (Signed)
Advanced Heart Failure Medication Review by a Pharmacist  Does the patient  feel that his/her medications are working for him/her?  yes  Has the patient been experiencing any side effects to the medications prescribed?  no  Does the patient measure his/her own blood pressure or blood glucose at home?  yes   Does the patient have any problems obtaining medications due to transportation or finances?   no  Understanding of regimen: good Understanding of indications: good Potential of compliance: good Patient understands to avoid NSAIDs. Patient understands to avoid decongestants.  Issues to address at subsequent visits: None   Pharmacist comments:  Ms. Barg is a pleasant 82 yo F presenting with her daughter and her current medication bottles. She reports good compliance with her regimen and has been doing well since her lasix was increased from 40 mg qam and 20 mg qod to 40/20 daily. She did not have any other medication-related questions or concerns for me at this time.   [name] Velva Harman, PharmD, BCPS, CPP Clinical Pharmacist Pager: [phone] Phone: [phone] 01/25/2016 12:11 PM      Time with patient: 10 minutes Preparation and documentation time: 2 minutes Total time: 12 minutes

## 2016-02-16 ENCOUNTER — Other Ambulatory Visit: Payer: Self-pay | Admitting: *Deleted

## 2016-02-16 MED ORDER — LEVOTHYROXINE SODIUM 88 MCG PO TABS: 88.0000 ug | ORAL_TABLET | Freq: Every day | ORAL | Status: DC

## 2016-02-16 NOTE — Telephone Encounter (Signed)
Received voicemail pt needs refill Levothyroxine sent to [facility].  Left message on personal voicemail that Rx was sent to [facility] as requested. Any questions please call office.

## 2016-03-12 ENCOUNTER — Ambulatory Visit (HOSPITAL_COMMUNITY)
Admission: RE | Admit: 2016-03-12 | Discharge: 2016-03-12 | Disposition: A | Discharge status: Home / Self Care | Payer: PPO | Source: Ambulatory Visit | Attending: Cardiology | Admitting: Cardiology

## 2016-03-12 ENCOUNTER — Encounter (HOSPITAL_COMMUNITY): Payer: Self-pay

## 2016-03-12 VITALS — BP 140/74 | HR 70 | Ht 62.0 in | Wt 139.0 lb

## 2016-03-12 DIAGNOSIS — M1990 Unspecified osteoarthritis, unspecified site: Secondary | ICD-10-CM | POA: Diagnosis not present

## 2016-03-12 DIAGNOSIS — I5022 Chronic systolic (congestive) heart failure: Secondary | ICD-10-CM | POA: Insufficient documentation

## 2016-03-12 DIAGNOSIS — Z9012 Acquired absence of left breast and nipple: Secondary | ICD-10-CM | POA: Diagnosis not present

## 2016-03-12 DIAGNOSIS — I130 Hypertensive heart and chronic kidney disease with heart failure and stage 1 through stage 4 chronic kidney disease, or unspecified chronic kidney disease: Secondary | ICD-10-CM | POA: Insufficient documentation

## 2016-03-12 DIAGNOSIS — E039 Hypothyroidism, unspecified: Secondary | ICD-10-CM | POA: Insufficient documentation

## 2016-03-12 DIAGNOSIS — R350 Frequency of micturition: Secondary | ICD-10-CM

## 2016-03-12 DIAGNOSIS — I429 Cardiomyopathy, unspecified: Secondary | ICD-10-CM | POA: Insufficient documentation

## 2016-03-12 DIAGNOSIS — Z853 Personal history of malignant neoplasm of breast: Secondary | ICD-10-CM | POA: Diagnosis not present

## 2016-03-12 DIAGNOSIS — N183 Chronic kidney disease, stage 3 unspecified: Secondary | ICD-10-CM

## 2016-03-12 DIAGNOSIS — Z79899 Other long term (current) drug therapy: Secondary | ICD-10-CM | POA: Insufficient documentation

## 2016-03-12 DIAGNOSIS — R300 Dysuria: Secondary | ICD-10-CM | POA: Insufficient documentation

## 2016-03-12 DIAGNOSIS — Z8249 Family history of ischemic heart disease and other diseases of the circulatory system: Secondary | ICD-10-CM | POA: Insufficient documentation

## 2016-03-12 DIAGNOSIS — K219 Gastro-esophageal reflux disease without esophagitis: Secondary | ICD-10-CM | POA: Insufficient documentation

## 2016-03-12 DIAGNOSIS — N184 Chronic kidney disease, stage 4 (severe): Secondary | ICD-10-CM | POA: Insufficient documentation

## 2016-03-12 DIAGNOSIS — Z923 Personal history of irradiation: Secondary | ICD-10-CM | POA: Insufficient documentation

## 2016-03-12 LAB — BASIC METABOLIC PANEL
Anion gap: 11 (ref 5–15)
BUN: 61 mg/dL — ABNORMAL HIGH (ref 6–20)
CALCIUM: 8.9 mg/dL (ref 8.9–10.3)
CHLORIDE: 108 mmol/L (ref 101–111)
CO2: 17 mmol/L — ABNORMAL LOW (ref 22–32)
CREATININE: 2.16 mg/dL — AB (ref 0.44–1.00)
GFR calc non Af Amer: 19 mL/min — ABNORMAL LOW (ref >60)
GFR, EST AFRICAN AMERICAN: 21 mL/min — AB (ref >60)
Glucose, Bld: 89 mg/dL (ref 65–99)
Potassium: 4.2 mmol/L (ref 3.5–5.1)
SODIUM: 136 mmol/L (ref 135–145)

## 2016-03-12 LAB — URINALYSIS, ROUTINE W REFLEX MICROSCOPIC
BILIRUBIN URINE: NEGATIVE
Glucose, UA: NEGATIVE mg/dL
Ketones, ur: NEGATIVE mg/dL
NITRITE: NEGATIVE
Protein, ur: 30 mg/dL — AB
SPECIFIC GRAVITY, URINE: 1.014 (ref 1.005–1.030)
pH: 5.5 (ref 5.0–8.0)

## 2016-03-12 LAB — URINE MICROSCOPIC-ADD ON

## 2016-03-12 NOTE — Patient Instructions (Signed)
Routine lab work today. Will notify you of abnormal results, otherwise no news is good news!  Follow up 3 months with Dr. McLean.  Do the following things EVERYDAY: 1) Weigh yourself in the morning before breakfast. Write it down and keep it in a log. 2) Take your medicines as prescribed 3) Eat low salt foods-Limit salt (sodium) to 2000 mg per day.  4) Stay as active as you can everyday 5) Limit all fluids for the day to less than 2 liters  

## 2016-03-12 NOTE — Progress Notes (Signed)
Patient ID: Mary Guzman, female   DOB: 1921/01/09, 82 y.o.   MRN: [id number] PCP: Dr Burnice Logan Cardiology: Dr. Aundra Dubin  82 yo with history of breast cancer in the 1990s, HTN, and CKD has developed congestive heart failure.  No prior known cardiac problems. She continues to live alone.  She dates her symptoms to around the time when she developed severe hip pain in 8/16.  She was under a lot of stress with the severe pain and ended up getting steroid injections.  She developed exertional dyspnea around that time.  However, for > 1 year she has had significant lower extremity edema.  She got to the point where she was short of breath after walking about 15-20 feet.  No chest pain.  CXR in 9/16 showed mild pulmonary edema.  This improved with increase in Lasix to 40 qam and every other day 20 mg pm Lasix.   Today she returns for HF follow up. Last visit lasix held for 2 days due to elelvated creatinine and 40 mg po lasix started daily. Weight at home 134-137 pounds. Overall feeling ok. Complains of fatigue. Complaining of painful urination over the last couple days.  Denies SOB/PND/Orthopnea. Appetitie ok. Taking all medications.   Labs (9/16): pro-BNP > 5000, TSH normal Labs (10/16): K 4.5, creatinine 1.68 Labs (11/16): K 4.7, creatinine 1.67 Labs (12/16): K 4.9, creatinine 1.86 Labs (5/17): K 4.9, creatinine 2.1, BNP 373, TSH 0.98 (normal) Labs 01/25/2016: K 4.5 Creatinine 2.37  PMH: 1. Breast cancer: On left, s/p mastectomy in 1994 along with radiation treatment.  No chemotherapy.  2. Hypothyroidism. 3. OA 4. HTN 5. GERD 6. 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  7. Chronic systolic CHF: Echo (123XX123) with EF 20-25%, diffuse hypokinesis, mild MR, moderately decreased RV systolic function, PASP 60 mmHg.   FH: Son with MI.  SH: Single, lives alone in [place], nonsmoker, no ETOH.   ROS: All systems reviewed and negative except as per HPI.   Current Outpatient Prescriptions  Medication Sig Dispense Refill  .  acetaminophen (TYLENOL) 500 MG tablet Take 500 mg by mouth every 6 (six) hours as needed for mild pain.     . Calcium Carb-Cholecalciferol (CALCIUM 600+D) 600-800 MG-UNIT TABS Take 1 tablet by mouth 2 (two) times daily.    . calcium carbonate (TUMS - DOSED IN MG ELEMENTAL CALCIUM) 500 MG chewable tablet Chew 1 tablet by mouth daily as needed for indigestion or heartburn.    . carboxymethylcellulose (REFRESH PLUS) 0.5 % SOLN Place 1 drop into both eyes 3 (three) times daily as needed (dry eye).    . Cholecalciferol (VITAMIN D3) 5000 UNITS CAPS Take 1 capsule by mouth daily.    . furosemide (LASIX) 20 MG tablet Take 2 tablets (40 mg total) by mouth daily. Take 40 mg (2 tablets) in the morning and 20 mg (1 tablet) in the afternoon 30 tablet 3  . levothyroxine (SYNTHROID, LEVOTHROID) 88 MCG tablet Take 1 tablet (88 mcg total) by mouth daily. 90 tablet 1  . loperamide (IMODIUM A-D) 2 MG tablet Take 2 mg by mouth 4 (four) times daily as needed for diarrhea or loose stools.    . metoprolol succinate (TOPROL-XL) 25 MG 24 hr tablet Take 1 tablet (25 mg total) by mouth 2 (two) times daily. 180 tablet 3  . Multiple Vitamins-Minerals (PRESERVISION AREDS 2 PO) Take 2 tablets by mouth daily.     . Probiotic Product (PROBIOTIC ADVANCED PO) Take 500 mg by mouth daily.  No current facility-administered medications for this encounter.   BP 140/74 mmHg  Pulse 70  Ht 5\' 2"  (1.575 m)  Wt 139 lb (63.05 kg)  BMI 25.42 kg/m2  SpO2 99% General: NAD Neck: JVP 6-7cm, no thyromegaly or thyroid nodule.  Lungs: CTAB  CV: Nondisplaced PMI.  Heart regular S1/S2 with widely split S2, no S3/S4, 1/6 SEM RUSB.   No carotid bruit.    Abdomen: Soft, nontender, no hepatosplenomegaly, no distention.  Skin: Intact without lesions or rashes.  Neurologic: Alert and oriented x 3.  Psych: Normal affect. Extremities: No clubbing or cyanosis. R and LLE compression stockings in place HEENT: Normal.   Assessment/Plan: 1. Chronic  systolic CHF: EF 0000000 with moderate RV dysfunction on echo in 10/16.  Etiology of the cardiomyopathy is uncertain: no chest pain or suggestion of CAD, however cannot rule out ischemic cardiomyopathy.  Symptoms noted at a time of significant stress with severe hip pain in 8/16, so cannot rule out Takotsubo CMP (but would typically expect EF to recover rather than stay low).  Viral myocarditis remains possible.  NYHA III. Volume status ok.  - Continue lasix 40 mg daily. Check BMET today.  - Continue Toprol XL 25 mg bid.  - Stay off lisinopril with CKD III-IV.  - She is unable to wear compression stockings.    2. CKD: Stage III-IV.   BMET today 3. Dysuria: Check UA and culture.   Followup in  8 weeks with Dr Aundra Dubin.    Amy Clegg NP-C  03/12/2016  Patient seen with NP, agree with the above note.  Volume looks ok, creatinine has been higher recently.  I will keep Lasix at 40 mg daily. Will check BMET/BNP today.    Dysuria concerning for UTI.  Will obtain UA, treat for UTI if suggestive.   Loralie Champagne 03/12/2016

## 2016-03-13 ENCOUNTER — Telehealth (HOSPITAL_COMMUNITY): Payer: Self-pay | Admitting: Cardiology

## 2016-03-13 MED ORDER — CIPROFLOXACIN HCL 250 MG PO TABS: 250.0000 mg | ORAL_TABLET | Freq: Two times a day (BID) | ORAL | Status: DC

## 2016-03-13 NOTE — Telephone Encounter (Signed)
Patient aware.

## 2016-03-13 NOTE — Telephone Encounter (Signed)
-----   Message from Larey Dresser, MD sent at 03/12/2016 10:06 PM EDT ----- She has a UTI, treat with ciprofloxacin 250 mg bid x 3 days.

## 2016-06-12 ENCOUNTER — Ambulatory Visit (HOSPITAL_COMMUNITY)
Admission: RE | Admit: 2016-06-12 | Discharge: 2016-06-12 | Disposition: A | Discharge status: Home / Self Care | Payer: PPO | Source: Ambulatory Visit | Attending: Cardiology | Admitting: Cardiology

## 2016-06-12 VITALS — BP 142/74 | HR 65 | Wt 143.8 lb

## 2016-06-12 DIAGNOSIS — K219 Gastro-esophageal reflux disease without esophagitis: Secondary | ICD-10-CM | POA: Insufficient documentation

## 2016-06-12 DIAGNOSIS — I5022 Chronic systolic (congestive) heart failure: Secondary | ICD-10-CM | POA: Insufficient documentation

## 2016-06-12 DIAGNOSIS — I400 Infective myocarditis: Secondary | ICD-10-CM | POA: Diagnosis not present

## 2016-06-12 DIAGNOSIS — N183 Chronic kidney disease, stage 3 unspecified: Secondary | ICD-10-CM

## 2016-06-12 DIAGNOSIS — M1990 Unspecified osteoarthritis, unspecified site: Secondary | ICD-10-CM | POA: Insufficient documentation

## 2016-06-12 DIAGNOSIS — Z923 Personal history of irradiation: Secondary | ICD-10-CM | POA: Diagnosis not present

## 2016-06-12 DIAGNOSIS — Z853 Personal history of malignant neoplasm of breast: Secondary | ICD-10-CM | POA: Insufficient documentation

## 2016-06-12 DIAGNOSIS — N184 Chronic kidney disease, stage 4 (severe): Secondary | ICD-10-CM | POA: Insufficient documentation

## 2016-06-12 DIAGNOSIS — Z8249 Family history of ischemic heart disease and other diseases of the circulatory system: Secondary | ICD-10-CM | POA: Diagnosis not present

## 2016-06-12 DIAGNOSIS — E039 Hypothyroidism, unspecified: Secondary | ICD-10-CM | POA: Diagnosis not present

## 2016-06-12 DIAGNOSIS — I429 Cardiomyopathy, unspecified: Secondary | ICD-10-CM | POA: Diagnosis not present

## 2016-06-12 DIAGNOSIS — I130 Hypertensive heart and chronic kidney disease with heart failure and stage 1 through stage 4 chronic kidney disease, or unspecified chronic kidney disease: Secondary | ICD-10-CM | POA: Insufficient documentation

## 2016-06-12 DIAGNOSIS — Z9012 Acquired absence of left breast and nipple: Secondary | ICD-10-CM | POA: Insufficient documentation

## 2016-06-12 LAB — BASIC METABOLIC PANEL
Anion gap: 12 (ref 5–15)
BUN: 68 mg/dL — AB (ref 6–20)
CHLORIDE: 106 mmol/L (ref 101–111)
CO2: 22 mmol/L (ref 22–32)
CREATININE: 2.17 mg/dL — AB (ref 0.44–1.00)
Calcium: 9 mg/dL (ref 8.9–10.3)
GFR calc Af Amer: 21 mL/min — ABNORMAL LOW (ref >60)
GFR calc non Af Amer: 18 mL/min — ABNORMAL LOW (ref >60)
Glucose, Bld: 100 mg/dL — ABNORMAL HIGH (ref 65–99)
Potassium: 4.7 mmol/L (ref 3.5–5.1)
SODIUM: 140 mmol/L (ref 135–145)

## 2016-06-12 LAB — BRAIN NATRIURETIC PEPTIDE: B NATRIURETIC PEPTIDE 5: 249.7 pg/mL — AB (ref 0.0–100.0)

## 2016-06-12 NOTE — Patient Instructions (Signed)
Labs today  Your physician recommends that you schedule a follow-up appointment in: 6 weeks with echocardiogram

## 2016-06-13 NOTE — Progress Notes (Signed)
Patient ID: Mary Guzman, female   DOB: 16-Jul-1921, 82 y.o.   MRN: [id number] PCP: Dr Burnice Logan Cardiology: Dr. Aundra Dubin  82 yo with history of breast cancer in the 1990s, HTN, and CKD has developed congestive heart failure.  No prior known cardiac problems. She continues to live alone.  She dates her symptoms to around the time when she developed severe hip pain in 8/16.  She was under a lot of stress with the severe pain and ended up getting steroid injections.  She developed exertional dyspnea around that time.  However, for > 1 year she has had significant lower extremity edema.  She got to the point where she was short of breath after walking about 15-20 feet.  No chest pain.  CXR in 9/16 showed mild pulmonary edema.  This improved with increase in Lasix to 40 qam and every other day 20 mg pm Lasix.   Today she returns for HF follow up.  Weight has gone up by about 4 lbs compared to prior appointment. She fatigues easily but is able to walk around the house with walker without dyspnea.  She developed increased ankle edema recently.  She increased her Lasix to 40 qam and 20 qpm (every day).  Edema is improved.  No lightheadedness, no orthopnea/PND.  No chest pain.   Labs (9/16): pro-BNP > 5000, TSH normal Labs (10/16): K 4.5, creatinine 1.68 Labs (11/16): K 4.7, creatinine 1.67 Labs (12/16): K 4.9, creatinine 1.86 Labs (5/17): K 4.9, creatinine 2.1, BNP 373, TSH 0.98 (normal) Labs 01/25/2016: K 4.5 Creatinine 2.37 Labs (6/17): K 4.2, creatinine 2.16  PMH: 1. Breast cancer: On left, s/p mastectomy in 1994 along with radiation treatment.  No chemotherapy.  2. Hypothyroidism. 3. OA 4. HTN 5. GERD 6. 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  7. Chronic systolic CHF: Echo (40/98) with EF 20-25%, diffuse hypokinesis, mild MR, moderately decreased RV systolic function, PASP 60 mmHg.   FH: Son with MI.  SH: Single, lives alone in [place], nonsmoker, no ETOH.   ROS: All systems reviewed and negative except as per  HPI.   Current Outpatient Prescriptions  Medication Sig Dispense Refill  . acetaminophen (TYLENOL) 500 MG tablet Take 500 mg by mouth every 6 (six) hours as needed for mild pain.     . Calcium Carb-Cholecalciferol (CALCIUM 600+D) 600-800 MG-UNIT TABS Take 1 tablet by mouth 2 (two) times daily.    . calcium carbonate (TUMS - DOSED IN MG ELEMENTAL CALCIUM) 500 MG chewable tablet Chew 1 tablet by mouth daily as needed for indigestion or heartburn.    . carboxymethylcellulose (REFRESH PLUS) 0.5 % SOLN Place 1 drop into both eyes 3 (three) times daily as needed (dry eye).    . Cholecalciferol (VITAMIN D3) 5000 UNITS CAPS Take 1 capsule by mouth daily.    . furosemide (LASIX) 20 MG tablet Take 2 tabs in AM and 1 tab in PM    . levothyroxine (SYNTHROID, LEVOTHROID) 88 MCG tablet Take 1 tablet (88 mcg total) by mouth daily. 90 tablet 1  . loperamide (IMODIUM A-D) 2 MG tablet Take 2 mg by mouth 4 (four) times daily as needed for diarrhea or loose stools.    . metoprolol succinate (TOPROL-XL) 25 MG 24 hr tablet Take 1 tablet (25 mg total) by mouth 2 (two) times daily. 180 tablet 3  . Multiple Vitamins-Minerals (PRESERVISION AREDS 2 PO) Take 2 tablets by mouth daily.     . Probiotic Product (PROBIOTIC ADVANCED PO) Take 500 mg by mouth daily.  No current facility-administered medications for this encounter.    BP (!) 142/74 (BP Location: Right Arm, Patient Position: Sitting, Cuff Size: Normal)   Pulse 65   Wt 143 lb 12 oz (65.2 kg)   SpO2 96%   BMI 26.29 kg/m  General: NAD Neck: JVP 7cm, no thyromegaly or thyroid nodule.  Lungs: CTAB  CV: Nondisplaced PMI.  Heart regular S1/S2 with widely split S2, no S3/S4, 1/6 SEM RUSB.   No carotid bruit.    Abdomen: Soft, nontender, no hepatosplenomegaly, no distention.  Skin: Intact without lesions or rashes.  Neurologic: Alert and oriented x 3.  Psych: Normal affect. Extremities: No clubbing or cyanosis. 1+ ankle edema.  HEENT: Normal.    Assessment/Plan: 1. Chronic systolic CHF: EF 68-03% with moderate RV dysfunction on echo in 10/16.  Etiology of the cardiomyopathy is uncertain: no chest pain or suggestion of CAD, however cannot rule out ischemic cardiomyopathy.  Symptoms noted at a time of significant stress with severe hip pain in 8/16, so cannot rule out Takotsubo CMP (but would typically expect EF to recover rather than stay low).  Viral myocarditis remains possible.  NYHA III, more volume overloaded recently but looks ok today on increased Lasix.   - Continue Lasix 40 qam/20 qpm.  - Continue Toprol XL 25 mg bid.  - Stay off lisinopril with CKD III-IV.  - BMET/BNP today. - Repeat echo at followup appointment.  2. CKD: Stage III-IV.   BMET today  Followup in 6 wks.  Loralie Champagne 06/13/2016

## 2016-06-18 ENCOUNTER — Ambulatory Visit (INDEPENDENT_AMBULATORY_CARE_PROVIDER_SITE_OTHER): Payer: PPO

## 2016-06-18 DIAGNOSIS — Z23 Encounter for immunization: Secondary | ICD-10-CM | POA: Diagnosis not present

## 2016-07-02 ENCOUNTER — Encounter: Payer: Self-pay | Admitting: Hematology and Oncology

## 2016-07-02 ENCOUNTER — Ambulatory Visit (HOSPITAL_BASED_OUTPATIENT_CLINIC_OR_DEPARTMENT_OTHER): Payer: PPO | Admitting: Hematology and Oncology

## 2016-07-02 DIAGNOSIS — Z853 Personal history of malignant neoplasm of breast: Secondary | ICD-10-CM | POA: Diagnosis not present

## 2016-07-02 DIAGNOSIS — I10 Essential (primary) hypertension: Secondary | ICD-10-CM | POA: Diagnosis not present

## 2016-07-02 DIAGNOSIS — N189 Chronic kidney disease, unspecified: Secondary | ICD-10-CM | POA: Diagnosis not present

## 2016-07-02 DIAGNOSIS — D631 Anemia in chronic kidney disease: Secondary | ICD-10-CM

## 2016-07-02 DIAGNOSIS — N184 Chronic kidney disease, stage 4 (severe): Secondary | ICD-10-CM

## 2016-07-02 NOTE — Progress Notes (Signed)
[place] OFFICE PROGRESS NOTE  Patient Care Team: [name], MD as PCP - General  SUMMARY OF ONCOLOGIC HISTORY:  I have reviewed her chart extensively. She has a background history of left breast cancer, moderately differentiated, sized unknown but with involvement of 3/8 lymph nodes, ER positive, PR negative, HER-2/neu unknown. The patient underwent left breast modified radical mastectomy in 1994, followed by radiation therapy. She was placed on tamoxifen for 5 years. She was then placed on Evista, discontinue due to recent acute heart failure  INTERVAL HISTORY: Please see below for problem oriented charting. She feels well. She denies any recent abnormal breast examination, palpable mass, abnormal breast appearance or nipple changes Denies recent infection. She complained of fatigue. The patient denies any recent signs or symptoms of bleeding such as spontaneous epistaxis, hematuria or hematochezia. She has chronic bilateral lower extremity edema  REVIEW OF SYSTEMS:   Constitutional: Denies fevers, chills or abnormal weight loss Eyes: Denies blurriness of vision Ears, nose, mouth, throat, and face: Denies mucositis or sore throat Respiratory: Denies cough, dyspnea or wheezes Cardiovascular: Denies palpitation, chest discomfort  Gastrointestinal:  Denies nausea, heartburn or change in bowel habits Skin: Denies abnormal skin rashes Lymphatics: Denies new lymphadenopathy or easy bruising Neurological:Denies numbness, tingling or new weaknesses Behavioral/Psych: Mood is stable, no new changes  All other systems were reviewed with the patient and are negative.  I have reviewed the past medical history, past surgical history, social history and family history with the patient and they are unchanged from previous note.  ALLERGIES:  has No Known Allergies.  MEDICATIONS:  Current Outpatient Prescriptions  Medication Sig Dispense Refill  . acetaminophen  (TYLENOL) 500 MG tablet Take 500 mg by mouth every 6 (six) hours as needed for mild pain.     . Calcium Carb-Cholecalciferol (CALCIUM 600+D) 600-800 MG-UNIT TABS Take 1 tablet by mouth 2 (two) times daily.    . calcium carbonate (TUMS - DOSED IN MG ELEMENTAL CALCIUM) 500 MG chewable tablet Chew 1 tablet by mouth daily as needed for indigestion or heartburn.    . carboxymethylcellulose (REFRESH PLUS) 0.5 % SOLN Place 1 drop into both eyes 3 (three) times daily as needed (dry eye).    . Cholecalciferol (VITAMIN D3) 5000 UNITS CAPS Take 1 capsule by mouth daily.    . furosemide (LASIX) 20 MG tablet Take 2 tabs in AM and 1 tab in PM    . levothyroxine (SYNTHROID, LEVOTHROID) 88 MCG tablet Take 1 tablet (88 mcg total) by mouth daily. 90 tablet 1  . loperamide (IMODIUM A-D) 2 MG tablet Take 2 mg by mouth 4 (four) times daily as needed for diarrhea or loose stools.    . metoprolol succinate (TOPROL-XL) 25 MG 24 hr tablet Take 1 tablet (25 mg total) by mouth 2 (two) times daily. 180 tablet 3  . Multiple Vitamins-Minerals (PRESERVISION AREDS 2 PO) Take 2 tablets by mouth daily.     . Probiotic Product (PROBIOTIC ADVANCED PO) Take 500 mg by mouth daily.      No current facility-administered medications for this visit.     PHYSICAL EXAMINATION: ECOG PERFORMANCE STATUS: 1 - Symptomatic but completely ambulatory  Vitals:   07/02/16 1054 07/02/16 1055  BP: (!) 188/70 (!) 158/61  Pulse: 61   Resp: 17   Temp: 97.7 F (36.5 C)    Filed Weights   07/02/16 1054  Weight: 145 lb 11.2 oz (66.1 kg)    GENERAL:alert, no distress and comfortable SKIN: skin  color, texture, turgor are normal, no rashes or significant lesions EYES: normal, Conjunctiva are pink and non-injected, sclera clear OROPHARYNX:no exudate, no erythema and lips, buccal mucosa, and tongue normal  NECK: supple, thyroid normal size, non-tender, without nodularity LYMPH:  no palpable lymphadenopathy in the cervical, axillary or  inguinal LUNGS: clear to auscultation and percussion with normal breathing effort HEART: regular rate & rhythm and no murmurs With mild-to-moderate lower extremity edema ABDOMEN:abdomen soft, non-tender and normal bowel sounds Musculoskeletal:no cyanosis of digits and no clubbing  NEURO: alert & oriented x 3 with fluent speech, no focal motor/sensory deficits Well-healed mastectomy scar on the left. Normal breast exam on the right LABORATORY DATA:  I have reviewed the data as listed    Component Value Date/Time   NA 140 06/12/2016 1140   NA 140 06/02/2013 1347   K 4.7 06/12/2016 1140   K 5.8 (H) 06/02/2013 1347   CL 106 06/12/2016 1140   CL 104 05/14/2012 1522   CO2 22 06/12/2016 1140   CO2 20 (L) 06/02/2013 1347   GLUCOSE 100 (H) 06/12/2016 1140   GLUCOSE 97 06/02/2013 1347   GLUCOSE 91 05/14/2012 1522   BUN 68 (H) 06/12/2016 1140   BUN 50.1 (H) 06/02/2013 1347   CREATININE 2.17 (H) 06/12/2016 1140   CREATININE 2.3 (H) 06/02/2013 1347   CALCIUM 9.0 06/12/2016 1140   CALCIUM 8.8 06/02/2013 1347   PROT 7.2 01/16/2016 1319   PROT 6.9 06/02/2013 1347   ALBUMIN 3.7 01/16/2016 1319   ALBUMIN 3.6 06/02/2013 1347   AST 41 (H) 01/16/2016 1319   AST 26 06/02/2013 1347   ALT 29 01/16/2016 1319   ALT 14 06/02/2013 1347   ALKPHOS 225 (H) 01/16/2016 1319   ALKPHOS 54 06/02/2013 1347   BILITOT 0.3 01/16/2016 1319   BILITOT 0.23 06/02/2013 1347   GFRNONAA 18 (L) 06/12/2016 1140   GFRAA 21 (L) 06/12/2016 1140    No results found for: SPEP, UPEP  Lab Results  Component Value Date   WBC 9.1 01/16/2016   NEUTROABS 6.1 01/16/2016   HGB 10.6 (L) 01/16/2016   HCT 32.4 (L) 01/16/2016   MCV 85.2 01/16/2016   PLT 371.0 01/16/2016      Chemistry      Component Value Date/Time   NA 140 06/12/2016 1140   NA 140 06/02/2013 1347   K 4.7 06/12/2016 1140   K 5.8 (H) 06/02/2013 1347   CL 106 06/12/2016 1140   CL 104 05/14/2012 1522   CO2 22 06/12/2016 1140   CO2 20 (L) 06/02/2013  1347   BUN 68 (H) 06/12/2016 1140   BUN 50.1 (H) 06/02/2013 1347   CREATININE 2.17 (H) 06/12/2016 1140   CREATININE 2.3 (H) 06/02/2013 1347      Component Value Date/Time   CALCIUM 9.0 06/12/2016 1140   CALCIUM 8.8 06/02/2013 1347   ALKPHOS 225 (H) 01/16/2016 1319   ALKPHOS 54 06/02/2013 1347   AST 41 (H) 01/16/2016 1319   AST 26 06/02/2013 1347   ALT 29 01/16/2016 1319   ALT 14 06/02/2013 1347   BILITOT 0.3 01/16/2016 1319   BILITOT 0.23 06/02/2013 1347       ASSESSMENT & PLAN:  BREAST CANCER, HX OF Clinically, she has no evidence of disease. I recommend discontinue mammogram screening. Says she is a long-term cancer survivor, I recommend discharge from the clinic. She is comfortable with the plan  Essential hypertension she will continue current medical management. I recommend close follow-up with primary care doctor  for medication adjustment.   Anemia in chronic kidney disease This is likely anemia of chronic disease. The patient denies recent history of bleeding such as epistaxis, hematuria or hematochezia. She is asymptomatic from the anemia. We will observe for now.   I will recommend consideration for ESA in the future if her hemoglobin is consistently less than 10 in the future   No orders of the defined types were placed in this encounter.  All questions were answered. The patient knows to call the clinic with any problems, questions or concerns. No barriers to learning was detected. I spent 15 minutes counseling the patient face to face. The total time spent in the appointment was 20 minutes and more than 50% was on counseling and review of test results     Heath Lark, MD 07/02/2016 1:18 PM

## 2016-07-02 NOTE — Assessment & Plan Note (Signed)
she will continue current medical management. I recommend close follow-up with primary care doctor for medication adjustment.  

## 2016-07-02 NOTE — Assessment & Plan Note (Signed)
Clinically, she has no evidence of disease. I recommend discontinue mammogram screening. Says she is a long-term cancer survivor, I recommend discharge from the clinic. She is comfortable with the plan

## 2016-07-02 NOTE — Assessment & Plan Note (Signed)
This is likely anemia of chronic disease. The patient denies recent history of bleeding such as epistaxis, hematuria or hematochezia. She is asymptomatic from the anemia. We will observe for now.   I will recommend consideration for ESA in the future if her hemoglobin is consistently less than 10 in the future

## 2016-07-22 ENCOUNTER — Ambulatory Visit: Payer: PPO | Admitting: Internal Medicine

## 2016-07-24 DIAGNOSIS — H43813 Vitreous degeneration, bilateral: Secondary | ICD-10-CM | POA: Diagnosis not present

## 2016-07-24 DIAGNOSIS — H353114 Nonexudative age-related macular degeneration, right eye, advanced atrophic with subfoveal involvement: Secondary | ICD-10-CM | POA: Diagnosis not present

## 2016-07-24 DIAGNOSIS — H353123 Nonexudative age-related macular degeneration, left eye, advanced atrophic without subfoveal involvement: Secondary | ICD-10-CM | POA: Diagnosis not present

## 2016-07-24 DIAGNOSIS — H35423 Microcystoid degeneration of retina, bilateral: Secondary | ICD-10-CM | POA: Diagnosis not present

## 2016-07-25 ENCOUNTER — Encounter: Payer: Self-pay | Admitting: Internal Medicine

## 2016-07-25 ENCOUNTER — Ambulatory Visit (INDEPENDENT_AMBULATORY_CARE_PROVIDER_SITE_OTHER): Payer: PPO | Admitting: Internal Medicine

## 2016-07-25 VITALS — BP 130/64 | HR 60 | Temp 97.8°F | Resp 18 | Ht 62.0 in | Wt 144.0 lb

## 2016-07-25 DIAGNOSIS — D631 Anemia in chronic kidney disease: Secondary | ICD-10-CM | POA: Diagnosis not present

## 2016-07-25 DIAGNOSIS — I5023 Acute on chronic systolic (congestive) heart failure: Secondary | ICD-10-CM

## 2016-07-25 DIAGNOSIS — I10 Essential (primary) hypertension: Secondary | ICD-10-CM

## 2016-07-25 DIAGNOSIS — K219 Gastro-esophageal reflux disease without esophagitis: Secondary | ICD-10-CM

## 2016-07-25 DIAGNOSIS — N184 Chronic kidney disease, stage 4 (severe): Secondary | ICD-10-CM

## 2016-07-25 DIAGNOSIS — E039 Hypothyroidism, unspecified: Secondary | ICD-10-CM | POA: Diagnosis not present

## 2016-07-25 LAB — COMPREHENSIVE METABOLIC PANEL
ALBUMIN: 3.8 g/dL (ref 3.5–5.2)
ALK PHOS: 212 U/L — AB (ref 39–117)
ALT: 21 U/L (ref 0–35)
AST: 29 U/L (ref 0–37)
BILIRUBIN TOTAL: 0.3 mg/dL (ref 0.2–1.2)
BUN: 65 mg/dL — AB (ref 6–23)
CO2: 25 mEq/L (ref 19–32)
CREATININE: 2.39 mg/dL — AB (ref 0.40–1.20)
Calcium: 9.2 mg/dL (ref 8.4–10.5)
Chloride: 105 mEq/L (ref 96–112)
GFR: 20.1 mL/min — ABNORMAL LOW (ref >60.00)
Glucose, Bld: 91 mg/dL (ref 70–99)
Potassium: 4.6 mEq/L (ref 3.5–5.1)
SODIUM: 140 meq/L (ref 135–145)
TOTAL PROTEIN: 6.6 g/dL (ref 6.0–8.3)

## 2016-07-25 LAB — CBC WITH DIFFERENTIAL/PLATELET
BASOS ABS: 0.1 10*3/uL (ref 0.0–0.1)
Basophils Relative: 0.8 % (ref 0.0–3.0)
EOS ABS: 0.3 10*3/uL (ref 0.0–0.7)
Eosinophils Relative: 4 % (ref 0.0–5.0)
HCT: 33.7 % — ABNORMAL LOW (ref 36.0–46.0)
HEMOGLOBIN: 11 g/dL — AB (ref 12.0–15.0)
Lymphocytes Relative: 24.4 % (ref 12.0–46.0)
Lymphs Abs: 1.6 10*3/uL (ref 0.7–4.0)
MCHC: 32.8 g/dL (ref 30.0–36.0)
MCV: 84.7 fl (ref 78.0–100.0)
MONO ABS: 0.6 10*3/uL (ref 0.1–1.0)
Monocytes Relative: 9.1 % (ref 3.0–12.0)
Neutro Abs: 4.2 10*3/uL (ref 1.4–7.7)
Neutrophils Relative %: 61.7 % (ref 43.0–77.0)
Platelets: 318 10*3/uL (ref 150.0–400.0)
RBC: 3.98 Mil/uL (ref 3.87–5.11)
RDW: 15.4 % (ref 11.5–15.5)
WBC: 6.7 10*3/uL (ref 4.0–10.5)

## 2016-07-25 LAB — TSH: TSH: 0.65 u[IU]/mL (ref 0.35–4.50)

## 2016-07-25 NOTE — Progress Notes (Signed)
Subjective:    Patient ID: Mary Guzman, female    DOB: 06/09/1921, 82 y.o.   MRN: [id number]  HPI 82 year old patient who is seen today for follow-up.  She has chronic kidney disease and secondary anemia.  She remains quite stable.  She has remote history of breast cancer 1994 and has been discharged from oncology clinic.  Doing quite well. She does have a history of GERD and does have some occasional dyspepsia. She has a history of chronic systolic heart failure, which has been stable.  She is followed for essential hypertension which has been controlled on metoprolol and diuretic therapy.  Denies any cardiopulmonary complaints   Past Medical History:  Diagnosis Date  . Cancer ([place])    Breast Hx of stage IIB, moderately differentiated with 3 of 8 possible lymph nodes.  . DJD (degenerative joint disease)   . GERD (gastroesophageal reflux disease)   . Hypertension   . Osteoporosis   . Thyroid disease    Hypothyroidism     Social History   Social History  . Marital status: Single    Spouse name: N/A  . Number of children: N/A  . Years of education: N/A   Occupational History  . Not on file.   Social History Main Topics  . Smoking status: Never Smoker  . Smokeless tobacco: Current User    Types: Chew  . Alcohol use No  . Drug use: Unknown  . Sexual activity: Not on file   Other Topics Concern  . Not on file   Social History Narrative   Fairly active interaction with grandchildren.    Past Surgical History:  Procedure Laterality Date  . ANKLE SURGERY     Right  . INTRACAPSULAR CATARACT EXTRACTION    . MASTECTOMY  1994    Family History  Problem Relation Age of Onset  . Alcohol abuse Brother   . Asthma Son     No Known Allergies  Current Outpatient Prescriptions on File Prior to Visit  Medication Sig Dispense Refill  . acetaminophen (TYLENOL) 500 MG tablet Take 500 mg by mouth every 6 (six) hours as needed for mild pain.     . Calcium  Carb-Cholecalciferol (CALCIUM 600+D) 600-800 MG-UNIT TABS Take 1 tablet by mouth 2 (two) times daily.    . calcium carbonate (TUMS - DOSED IN MG ELEMENTAL CALCIUM) 500 MG chewable tablet Chew 1 tablet by mouth daily as needed for indigestion or heartburn.    . carboxymethylcellulose (REFRESH PLUS) 0.5 % SOLN Place 1 drop into both eyes 3 (three) times daily as needed (dry eye).    . Cholecalciferol (VITAMIN D3) 5000 UNITS CAPS Take 1 capsule by mouth daily.    . furosemide (LASIX) 20 MG tablet Take 2 tabs in AM and 1 tab in PM    . levothyroxine (SYNTHROID, LEVOTHROID) 88 MCG tablet Take 1 tablet (88 mcg total) by mouth daily. 90 tablet 1  . loperamide (IMODIUM A-D) 2 MG tablet Take 2 mg by mouth 4 (four) times daily as needed for diarrhea or loose stools.    . metoprolol succinate (TOPROL-XL) 25 MG 24 hr tablet Take 1 tablet (25 mg total) by mouth 2 (two) times daily. 180 tablet 3  . Multiple Vitamins-Minerals (PRESERVISION AREDS 2 PO) Take 2 tablets by mouth daily.     . Probiotic Product (PROBIOTIC ADVANCED PO) Take 500 mg by mouth daily.      No current facility-administered medications on file prior to visit.  BP 130/64 (BP Location: Right Arm, Patient Position: Sitting, Cuff Size: Normal)   Pulse 60   Temp 97.8 F (36.6 C) (Oral)   Resp 18   Ht 5\' 2"  (1.575 m)   Wt 144 lb (65.3 kg)   SpO2 95%   BMI 26.34 kg/m       Review of Systems  Constitutional: Negative.   HENT: Negative for congestion, dental problem, hearing loss, rhinorrhea, sinus pressure, sore throat and tinnitus.   Eyes: Negative for pain, discharge and visual disturbance.  Respiratory: Negative for cough and shortness of breath.   Cardiovascular: Negative for chest pain, palpitations and leg swelling.  Gastrointestinal: Negative for abdominal distention, abdominal pain, blood in stool, constipation, diarrhea, nausea and vomiting.  Genitourinary: Negative for difficulty urinating, dysuria, flank pain,  frequency, hematuria, pelvic pain, urgency, vaginal bleeding, vaginal discharge and vaginal pain.  Musculoskeletal: Negative for arthralgias, gait problem and joint swelling.  Skin: Negative for rash.  Neurological: Negative for dizziness, syncope, speech difficulty, weakness, numbness and headaches.  Hematological: Negative for adenopathy.  Psychiatric/Behavioral: Negative for agitation, behavioral problems and dysphoric mood. The patient is not nervous/anxious.        Objective:   Physical Exam  Constitutional: She is oriented to person, place, and time. She appears well-developed and well-nourished.  Blood pressure 130/60  HENT:  Head: Normocephalic.  Right Ear: External ear normal.  Left Ear: External ear normal.  Mouth/Throat: Oropharynx is clear and moist.  Eyes: Conjunctivae and EOM are normal. Pupils are equal, round, and reactive to light.  Neck: Normal range of motion. Neck supple. No thyromegaly present.  Cardiovascular: Normal rate, regular rhythm, normal heart sounds and intact distal pulses.   Pulmonary/Chest: Effort normal and breath sounds normal. No respiratory distress. She has no wheezes. She has no rales.  Kyphosis  Abdominal: Soft. Bowel sounds are normal. She exhibits no mass. There is no tenderness.  Musculoskeletal: Normal range of motion.  Swelling about the right ankle only related to prior fracture and surgery  Lymphadenopathy:    She has no cervical adenopathy.  Neurological: She is alert and oriented to person, place, and time.  Skin: Skin is warm and dry. No rash noted.  Psychiatric: She has a normal mood and affect. Her behavior is normal.          Assessment & Plan:  Essential hypertension, well-controlled Chronic kidney disease.  We'll recheck renal indices Anemia secondary to chronic kidney disease.  Will check CBC Mild dyspepsia.  Will continue daily Zantac as needed as well as Tums Remote breast cancer Chronic systolic heart failure,  compensated  Recheck 6 months or as needed  [employer]

## 2016-07-25 NOTE — Patient Instructions (Signed)
Limit your sodium (Salt) intake  Report any significant swelling of the legs or shortness of breath or any significant weight gain  Take a calcium supplement, plus [phone] units of vitamin D  Return in 6 months for follow-up

## 2016-07-29 ENCOUNTER — Ambulatory Visit (HOSPITAL_COMMUNITY)
Admission: RE | Admit: 2016-07-29 | Discharge: 2016-07-29 | Disposition: A | Discharge status: Home / Self Care | Payer: PPO | Source: Ambulatory Visit | Attending: Internal Medicine | Admitting: Internal Medicine

## 2016-07-29 ENCOUNTER — Ambulatory Visit (HOSPITAL_BASED_OUTPATIENT_CLINIC_OR_DEPARTMENT_OTHER)
Admission: RE | Admit: 2016-07-29 | Discharge: 2016-07-29 | Disposition: A | Discharge status: Home / Self Care | Payer: PPO | Source: Ambulatory Visit | Attending: Cardiology | Admitting: Cardiology

## 2016-07-29 VITALS — BP 126/74 | HR 62 | Wt 144.5 lb

## 2016-07-29 DIAGNOSIS — I5022 Chronic systolic (congestive) heart failure: Secondary | ICD-10-CM

## 2016-07-29 DIAGNOSIS — N183 Chronic kidney disease, stage 3 unspecified: Secondary | ICD-10-CM

## 2016-07-29 LAB — ECHOCARDIOGRAM COMPLETE
CHL CUP DOP CALC LVOT VTI: 20 cm
CHL CUP STROKE VOLUME: 50 mL
CHL CUP TV REG PEAK VELOCITY: 241 cm/s
E decel time: 342 msec
EERAT: 17
FS: 21 % — AB (ref 28–44)
IVS/LV PW RATIO, ED: 1.04
LA ID, A-P, ES: 39 mm
LA diam end sys: 39 mm
LA diam index: 2.35 cm/m2
LAVOLA4C: 35.7 mL
LDCA: 2.84 cm2
LV E/e' medial: 17
LV E/e'average: 17
LV PW d: 13.6 mm — AB (ref 0.6–1.1)
LV SIMPSON'S DISK: 54
LV e' LATERAL: 4.46 cm/s
LV sys vol: 42 mL (ref 14–42)
LVDIAVOL: 91 mL (ref 46–106)
LVDIAVOLIN: 55 mL/m2
LVOT SV: 57 mL
LVOT diameter: 19 mm
LVOTPV: 79.4 cm/s
LVSYSVOLIN: 25 mL/m2
MV Dec: 342
MV pk E vel: 75.8 m/s
MVPG: 2 mmHg
MVPKAVEL: 150 m/s
RV TAPSE: 22.2 mm
TDI e' lateral: 4.46
TDI e' medial: 3.37
TR max vel: 241 cm/s

## 2016-07-29 MED ORDER — PANTOPRAZOLE SODIUM 40 MG PO TBEC: 40.0000 mg | DELAYED_RELEASE_TABLET | Freq: Every day | ORAL | 6 refills | Status: DC

## 2016-07-29 MED ORDER — ASPIRIN EC 81 MG PO TBEC: 81.0000 mg | DELAYED_RELEASE_TABLET | Freq: Every day | ORAL | 6 refills | Status: DC

## 2016-07-29 NOTE — Patient Instructions (Signed)
Start taking Protonix 40 mg (1 Tab) once a Day  Start taking Aspirin 81 mg (1 Tab) once a day  Follow up in 6 months

## 2016-07-29 NOTE — Progress Notes (Signed)
  Echocardiogram 2D Echocardiogram has been performed.  Donata Clay 07/29/2016, 12:06 PM

## 2016-07-30 NOTE — Progress Notes (Signed)
Patient ID: Mary Guzman, female   DOB: [birth year], 82 y.o.   MRN: [id number] PCP: Dr Burnice Logan Cardiology: Dr. Aundra Dubin  82 yo with history of breast cancer in the 1990s, HTN, and CKD has developed congestive heart failure.  No prior known cardiac problems. She continues to live alone.  She dates her symptoms to around the time when she developed severe hip pain in 8/16.  She was under a lot of stress with the severe pain and ended up getting steroid injections.  She developed exertional dyspnea around that time.  However, for > 1 year she has had significant lower extremity edema.  She got to the point where she was short of breath after walking about 15-20 feet.  No chest pain.  CXR in 9/16 showed mild pulmonary edema.  This improved with increase in Lasix to 40 qam and every other day 20 mg pm Lasix.  Repeat echo was done in 11/17.  This showed improvement in EF to 55%.    Today, she returns for HF follow up.  Weight is stable. She fatigues easily but is able to walk around the house with walker without dyspnea.  No lightheadedness, no orthopnea/PND.  No chest pain.  She feels like she has been having a lot of "heartburn" recently.  Symptoms resolve with Tums.  Spicy food will trigger.  No exertional chest pain.    Labs (9/16): pro-BNP > 5000, TSH normal Labs (10/16): K 4.5, creatinine 1.68 Labs (11/16): K 4.7, creatinine 1.67 Labs (12/16): K 4.9, creatinine 1.86 Labs (5/17): K 4.9, creatinine 2.1, BNP 373, TSH 0.98 (normal) Labs 01/25/2016: K 4.5 Creatinine 2.37 Labs (6/17): K 4.2, creatinine 2.16 Labs (11/17): K 4.6, creatinine 2.39, HCT 33.7  PMH: 1. Breast cancer: On left, s/p mastectomy in 1994 along with radiation treatment.  No chemotherapy.  2. Hypothyroidism. 3. OA 4. HTN 5. GERD 6. 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  7. Chronic systolic CHF: Echo (50/27) with EF 20-25%, diffuse hypokinesis, mild MR, moderately decreased RV systolic function, PASP 60 mmHg.  - Echo (11/17): EF 55%, septal-lateral  dyssynchrony, normal RV size and systolic function.   FH: Son with MI.  SH: Single, lives alone in [place], nonsmoker, no ETOH.   ROS: All systems reviewed and negative except as per HPI.   Current Outpatient Prescriptions  Medication Sig Dispense Refill  . acetaminophen (TYLENOL) 500 MG tablet Take 500 mg by mouth every 6 (six) hours as needed for mild pain.     . Calcium Carb-Cholecalciferol (CALCIUM 600+D) 600-800 MG-UNIT TABS Take 1 tablet by mouth 2 (two) times daily.    . calcium carbonate (TUMS - DOSED IN MG ELEMENTAL CALCIUM) 500 MG chewable tablet Chew 1 tablet by mouth daily as needed for indigestion or heartburn.    . carboxymethylcellulose (REFRESH PLUS) 0.5 % SOLN Place 1 drop into both eyes 3 (three) times daily as needed (dry eye).    . Cholecalciferol (VITAMIN D3) 5000 UNITS CAPS Take 1 capsule by mouth daily.    . furosemide (LASIX) 20 MG tablet Take 2 tabs in AM and 1 tab in PM    . levothyroxine (SYNTHROID, LEVOTHROID) 88 MCG tablet Take 1 tablet (88 mcg total) by mouth daily. 90 tablet 1  . loperamide (IMODIUM A-D) 2 MG tablet Take 2 mg by mouth 4 (four) times daily as needed for diarrhea or loose stools.    . metoprolol succinate (TOPROL-XL) 25 MG 24 hr tablet Take 1 tablet (25 mg total) by mouth 2 (two) times  daily. 180 tablet 3  . Multiple Vitamins-Minerals (PRESERVISION AREDS 2 PO) Take 2 tablets by mouth daily.     . Probiotic Product (PROBIOTIC ADVANCED PO) Take 500 mg by mouth daily.     Marland Kitchen aspirin EC 81 MG tablet Take 1 tablet (81 mg total) by mouth daily. 30 tablet 6  . pantoprazole (PROTONIX) 40 MG tablet Take 1 tablet (40 mg total) by mouth daily. 30 tablet 6   No current facility-administered medications for this encounter.    BP 126/74   Pulse 62   Wt 144 lb 8 oz (65.5 kg)   SpO2 99%   BMI 26.43 kg/m  General: NAD Neck: JVP 7cm, no thyromegaly or thyroid nodule.  Lungs: CTAB  CV: Nondisplaced PMI.  Heart regular S1/S2 with widely split S2, no  S3/S4, 1/6 SEM RUSB.   No carotid bruit.    Abdomen: Soft, nontender, no hepatosplenomegaly, no distention.  Skin: Intact without lesions or rashes.  Neurologic: Alert and oriented x 3.  Psych: Normal affect. Extremities: No clubbing or cyanosis. 1+ ankle edema.  HEENT: Normal.   Assessment/Plan: 1. Chronic systolic CHF: EF 26-41% with moderate RV dysfunction on echo in 10/16.  Etiology of the cardiomyopathy is uncertain: no chest pain or suggestion of CAD, however cannot rule out ischemic cardiomyopathy.  Symptoms noted at a time of significant stress with severe hip pain in 8/16, so cannot rule out Takotsubo CMP.  Viral myocarditis remains possible.  I reviewed the echo done today, this showed recovery of EF to 55%.  Therefore, it is possible that the original insult was a stress (Takotsubo-type) cardiomyopathy.  NYHA class III symptoms, volume status looks ok. - Continue Lasix 40 qam/20 qpm.  - Continue Toprol XL 25 mg bid.  - Stay off lisinopril with CKD III-IV.  2. CKD: Stage III-IV.   Recent stable BMET. 3. Chest discomfort: Most likely GERD (nonexertional, relieved by Tums).   - Start Protonix 40 mg daily.  - Also add ASA 81 mg daily.   - She will call if no improvement.   Followup in 6 months.  Loralie Champagne 07/30/2016

## 2016-08-30 ENCOUNTER — Other Ambulatory Visit: Payer: Self-pay | Admitting: Internal Medicine

## 2016-08-30 ENCOUNTER — Other Ambulatory Visit (HOSPITAL_COMMUNITY): Payer: Self-pay | Admitting: Cardiology

## 2016-11-04 ENCOUNTER — Other Ambulatory Visit: Payer: Self-pay | Admitting: Internal Medicine

## 2016-11-04 ENCOUNTER — Other Ambulatory Visit (HOSPITAL_COMMUNITY): Payer: Self-pay | Admitting: Cardiology

## 2016-11-05 ENCOUNTER — Other Ambulatory Visit (HOSPITAL_COMMUNITY): Payer: Self-pay | Admitting: Cardiology

## 2017-01-22 DIAGNOSIS — H353123 Nonexudative age-related macular degeneration, left eye, advanced atrophic without subfoveal involvement: Secondary | ICD-10-CM | POA: Diagnosis not present

## 2017-01-28 ENCOUNTER — Encounter: Payer: Self-pay | Admitting: Internal Medicine

## 2017-01-28 ENCOUNTER — Ambulatory Visit (INDEPENDENT_AMBULATORY_CARE_PROVIDER_SITE_OTHER): Payer: PPO | Admitting: Internal Medicine

## 2017-01-28 VITALS — BP 158/64 | HR 58 | Temp 98.3°F | Wt 145.0 lb

## 2017-01-28 DIAGNOSIS — E039 Hypothyroidism, unspecified: Secondary | ICD-10-CM

## 2017-01-28 DIAGNOSIS — I10 Essential (primary) hypertension: Secondary | ICD-10-CM

## 2017-01-28 DIAGNOSIS — I5022 Chronic systolic (congestive) heart failure: Secondary | ICD-10-CM

## 2017-01-28 DIAGNOSIS — N183 Chronic kidney disease, stage 3 unspecified: Secondary | ICD-10-CM

## 2017-01-28 NOTE — Patient Instructions (Addendum)
WE NOW OFFER   [facility] FAST TRACK!!!  SAME DAY Appointments for ACUTE CARE  Such as: Sprains, Injuries, cuts, abrasions, rashes, muscle pain, joint pain, back pain Colds, flu, sore throats, headache, allergies, cough, fever  Ear pain, sinus and eye infections Abdominal pain, nausea, vomiting, diarrhea, upset stomach Animal/insect bites  3 Easy Ways to Schedule: Walk-In Scheduling Call in scheduling Mychart Sign-up: [email or web address]   Limit your sodium (Salt) intake        Cervical Collar A cervical collar is a device that supports your chin and the back of your head. It is used after a severe neck injury to protect your head and neck. It does this by restricting the movement of the top part of your spine, which is located in your neck. A cervical collar may be used when you have:  A fractured neck.  Ligament damage.  A spinal cord injury. What instructions should I follow?  Wear the collar for as long as your health care provider instructs.  Follow your health care provider's instructions about how to put on and take off your collar.  Do not make your collar so tight that you feel pain or it is hard for you to breathe.  Do not remove the collar unless your health care provider says it is okay. Ask your health care provider if you can remove the collar for showering or eating or to apply ice.  Do not drive a car until your health care provider says it is okay.  Keep all follow-up visits as directed by your health care provider. This is important. Any delay in getting necessary care can keep your injury from healing properly.  Apply ice to the injured area:  Put ice in a plastic bag.  Place a towel between your skin and the bag.  Leave the ice on for 20 minutes, 2-3 times per day for the first 2 days. This information is not intended to replace advice given to you by your health care provider. Make sure you discuss any questions you  have with your health care provider. Document Released: 05/25/2004 Document Revised: 01/11/2016 Document Reviewed: 04/11/2014 Elsevier Interactive Patient Education  2017 [employer].

## 2017-01-28 NOTE — Progress Notes (Signed)
Subjective:    Patient ID: Mary Guzman, female    DOB: [birth year], 83 y.o.   MRN: [id number]  HPI 83 year old patient who is seen today for her six-month follow-up.  She has a history of essential hypertension and chronic systolic heart failure.  She has been quite stable.  She has remote history of breast cancer.  She has hypothyroidism. Doing fairly well today except for some neck and low back discomfort No cardiac symptoms  Past Medical History:  Diagnosis Date  . Cancer ([place])    Breast Hx of stage IIB, moderately differentiated with 3 of 8 possible lymph nodes.  . DJD (degenerative joint disease)   . GERD (gastroesophageal reflux disease)   . Hypertension   . Osteoporosis   . Thyroid disease    Hypothyroidism     Social History   Social History  . Marital status: Widowed    Spouse name: N/A  . Number of children: N/A  . Years of education: N/A   Occupational History  . Not on file.   Social History Main Topics  . Smoking status: Never Smoker  . Smokeless tobacco: Current User    Types: Chew  . Alcohol use No  . Drug use: Unknown  . Sexual activity: Not on file   Other Topics Concern  . Not on file   Social History Narrative   Fairly active interaction with grandchildren.    Past Surgical History:  Procedure Laterality Date  . ANKLE SURGERY     Right  . INTRACAPSULAR CATARACT EXTRACTION    . MASTECTOMY  1994    Family History  Problem Relation Age of Onset  . Alcohol abuse Brother   . Asthma Son     No Known Allergies  Current Outpatient Prescriptions on File Prior to Visit  Medication Sig Dispense Refill  . acetaminophen (TYLENOL) 500 MG tablet Take 500 mg by mouth every 6 (six) hours as needed for mild pain.     Marland Kitchen aspirin EC 81 MG tablet Take 1 tablet (81 mg total) by mouth daily. 30 tablet 6  . Calcium Carb-Cholecalciferol (CALCIUM 600+D) 600-800 MG-UNIT TABS Take 1 tablet by mouth 2 (two) times daily.    . carboxymethylcellulose (REFRESH  PLUS) 0.5 % SOLN Place 1 drop into both eyes 3 (three) times daily as needed (dry eye).    . Cholecalciferol (VITAMIN D3) 5000 UNITS CAPS Take 1 capsule by mouth daily.    . furosemide (LASIX) 20 MG tablet TAKE 2 TABLETS BY MOUTH EVERY MORNING AND 1 TABLET EVERY OTHER DAY IN THE EVENING 240 tablet 2  . levothyroxine (SYNTHROID, LEVOTHROID) 88 MCG tablet TAKE 1 TABLET(88 MCG) BY MOUTH DAILY 90 tablet 0  . loperamide (IMODIUM A-D) 2 MG tablet Take 2 mg by mouth 4 (four) times daily as needed for diarrhea or loose stools.    . metoprolol succinate (TOPROL-XL) 25 MG 24 hr tablet TAKE 1 TABLET BY MOUTH TWICE DAILY 180 tablet 2  . Multiple Vitamins-Minerals (PRESERVISION AREDS 2 PO) Take 2 tablets by mouth daily.     . pantoprazole (PROTONIX) 40 MG tablet Take 1 tablet (40 mg total) by mouth daily. 30 tablet 6  . Probiotic Product (PROBIOTIC ADVANCED PO) Take 500 mg by mouth daily.     . calcium carbonate (TUMS - DOSED IN MG ELEMENTAL CALCIUM) 500 MG chewable tablet Chew 1 tablet by mouth daily as needed for indigestion or heartburn.     No current facility-administered medications on file prior to  visit.     BP (!) 158/64 (BP Location: Right Arm, Patient Position: Sitting, Cuff Size: Normal)   Pulse (!) 58   Temp 98.3 F (36.8 C) (Oral)   Wt 145 lb (65.8 kg)   SpO2 96%   BMI 26.52 kg/m      Review of Systems  Constitutional: Negative.   HENT: Negative for congestion, dental problem, hearing loss, rhinorrhea, sinus pressure, sore throat and tinnitus.   Eyes: Negative for pain, discharge and visual disturbance.  Respiratory: Negative for cough and shortness of breath.   Cardiovascular: Negative for chest pain, palpitations and leg swelling.  Gastrointestinal: Negative for abdominal distention, abdominal pain, blood in stool, constipation, diarrhea, nausea and vomiting.  Genitourinary: Negative for difficulty urinating, dysuria, flank pain, frequency, hematuria, pelvic pain, urgency, vaginal  bleeding, vaginal discharge and vaginal pain.  Musculoskeletal: Positive for arthralgias, back pain, neck pain and neck stiffness. Negative for gait problem and joint swelling.  Skin: Negative for rash.  Neurological: Negative for dizziness, syncope, speech difficulty, weakness, numbness and headaches.  Hematological: Negative for adenopathy.  Psychiatric/Behavioral: Negative for agitation, behavioral problems and dysphoric mood. The patient is not nervous/anxious.        Objective:   Physical Exam  Constitutional: She is oriented to person, place, and time. She appears well-developed and well-nourished.  HENT:  Head: Normocephalic.  Right Ear: External ear normal.  Left Ear: External ear normal.  Mouth/Throat: Oropharynx is clear and moist.  Eyes: Conjunctivae and EOM are normal. Pupils are equal, round, and reactive to light.  Neck: Normal range of motion. Neck supple. No thyromegaly present.  Cardiovascular: Normal rate, regular rhythm, normal heart sounds and intact distal pulses.   Pulmonary/Chest: Effort normal and breath sounds normal.  Abdominal: Soft. Bowel sounds are normal. She exhibits no mass. There is no tenderness.  Musculoskeletal: Normal range of motion. She exhibits edema.  Lymphadenopathy:    She has no cervical adenopathy.  Neurological: She is alert and oriented to person, place, and time.  Skin: Skin is warm and dry. No rash noted.  Psychiatric: She has a normal mood and affect. Her behavior is normal.          Assessment & Plan:   Essential hypertension, stable Chronic diastolic heart failure, compensated  Osteoarthritis Hypothyroidism  No change in medical regimen Follow-up 6 months  [place] Brigance Pilar Plate

## 2017-02-03 ENCOUNTER — Encounter (HOSPITAL_COMMUNITY): Payer: Self-pay

## 2017-02-03 ENCOUNTER — Ambulatory Visit (HOSPITAL_COMMUNITY)
Admission: RE | Admit: 2017-02-03 | Discharge: 2017-02-03 | Disposition: A | Discharge status: Home / Self Care | Payer: PPO | Source: Ambulatory Visit | Attending: Cardiology | Admitting: Cardiology

## 2017-02-03 VITALS — BP 180/64 | HR 60 | Ht 62.0 in | Wt 144.5 lb

## 2017-02-03 DIAGNOSIS — I5022 Chronic systolic (congestive) heart failure: Secondary | ICD-10-CM | POA: Diagnosis not present

## 2017-02-03 DIAGNOSIS — Z853 Personal history of malignant neoplasm of breast: Secondary | ICD-10-CM | POA: Insufficient documentation

## 2017-02-03 DIAGNOSIS — R531 Weakness: Secondary | ICD-10-CM | POA: Insufficient documentation

## 2017-02-03 DIAGNOSIS — N184 Chronic kidney disease, stage 4 (severe): Secondary | ICD-10-CM | POA: Diagnosis not present

## 2017-02-03 DIAGNOSIS — N183 Chronic kidney disease, stage 3 unspecified: Secondary | ICD-10-CM

## 2017-02-03 DIAGNOSIS — Z7982 Long term (current) use of aspirin: Secondary | ICD-10-CM | POA: Diagnosis not present

## 2017-02-03 DIAGNOSIS — K219 Gastro-esophageal reflux disease without esophagitis: Secondary | ICD-10-CM | POA: Insufficient documentation

## 2017-02-03 DIAGNOSIS — I130 Hypertensive heart and chronic kidney disease with heart failure and stage 1 through stage 4 chronic kidney disease, or unspecified chronic kidney disease: Secondary | ICD-10-CM | POA: Diagnosis not present

## 2017-02-03 DIAGNOSIS — E039 Hypothyroidism, unspecified: Secondary | ICD-10-CM | POA: Diagnosis not present

## 2017-02-03 LAB — CBC
HCT: 33.5 % — ABNORMAL LOW (ref 36.0–46.0)
HEMOGLOBIN: 10.3 g/dL — AB (ref 12.0–15.0)
MCH: 27.8 pg (ref 26.0–34.0)
MCHC: 30.7 g/dL (ref 30.0–36.0)
MCV: 90.5 fL (ref 78.0–100.0)
PLATELETS: 335 10*3/uL (ref 150–400)
RBC: 3.7 MIL/uL — ABNORMAL LOW (ref 3.87–5.11)
RDW: 14.5 % (ref 11.5–15.5)
WBC: 7.2 10*3/uL (ref 4.0–10.5)

## 2017-02-03 LAB — BASIC METABOLIC PANEL
Anion gap: 10 (ref 5–15)
BUN: 71 mg/dL — ABNORMAL HIGH (ref 6–20)
CHLORIDE: 109 mmol/L (ref 101–111)
CO2: 22 mmol/L (ref 22–32)
CREATININE: 3.01 mg/dL — AB (ref 0.44–1.00)
Calcium: 8.9 mg/dL (ref 8.9–10.3)
GFR, EST AFRICAN AMERICAN: 14 mL/min — AB (ref >60)
GFR, EST NON AFRICAN AMERICAN: 12 mL/min — AB (ref >60)
Glucose, Bld: 101 mg/dL — ABNORMAL HIGH (ref 65–99)
Potassium: 4.8 mmol/L (ref 3.5–5.1)
SODIUM: 141 mmol/L (ref 135–145)

## 2017-02-03 LAB — TSH: TSH: 0.263 u[IU]/mL — AB (ref 0.350–4.500)

## 2017-02-03 LAB — BRAIN NATRIURETIC PEPTIDE: B NATRIURETIC PEPTIDE 5: 282.1 pg/mL — AB (ref 0.0–100.0)

## 2017-02-03 NOTE — Patient Instructions (Signed)
Routine lab work today. Will notify you of abnormal results  Check blood pressure for 2 weeks and call our office with readings.   Follow up and echo in 6 months.

## 2017-02-04 ENCOUNTER — Telehealth (HOSPITAL_COMMUNITY): Payer: Self-pay | Admitting: *Deleted

## 2017-02-04 DIAGNOSIS — R7989 Other specified abnormal findings of blood chemistry: Secondary | ICD-10-CM

## 2017-02-04 NOTE — Telephone Encounter (Signed)
Spoke with  Patients daughter she is aware and agreeable. Added to lab schedule tomorrow.

## 2017-02-04 NOTE — Progress Notes (Signed)
Patient ID: ALEJAH ARISTIZABAL, female   DOB: 08-25-1921, 83 y.o.   MRN: [id number] PCP: Dr Burnice Logan Cardiology: Dr. Aundra Dubin  83 yo with history of breast cancer in the 1990s, HTN, and CKD presents for followup of CHF. She lives alone but daughter helps.  She dates her symptoms to around the time when she developed severe hip pain in 8/16. She was under a lot of stress with the severe pain and ended up getting steroid injections.  She developed exertional dyspnea around that time.  However, for > 1 year she has had significant lower extremity edema.  She got to the point where she was short of breath after walking about 15-20 feet.  No chest pain.  CXR in 9/16 showed mild pulmonary edema.  This improved with increase in Lasix to 40 qam and every other day 20 mg pm Lasix.  Repeat echo was done in 11/17.  This showed improvement in EF to 55%.    Today, she returns for HF follow up.  Weight is stable. Main complaint is fatigue and weakness.  She is fatigued after walking 100-200 feet.  She is not short of breath with exertion, just tired.  She goes to the grocery store and uses electric cart.  No chest pain.  BP high in the office today but she says that it generally runs in the 092 systolic range at home.   ECG: Personally reviewed, NSR, LVH with repolarization abnormality.   Labs (9/16): pro-BNP > 5000, TSH normal Labs (10/16): K 4.5, creatinine 1.68 Labs (11/16): K 4.7, creatinine 1.67 Labs (12/16): K 4.9, creatinine 1.86 Labs (5/17): K 4.9, creatinine 2.1, BNP 373, TSH 0.98 (normal) Labs 01/25/2016: K 4.5 Creatinine 2.37 Labs (6/17): K 4.2, creatinine 2.16 Labs (11/17): K 4.6, creatinine 2.39, HCT 33.7  PMH: 1. Breast cancer: On left, s/p mastectomy in 1994 along with radiation treatment.  No chemotherapy.  2. Hypothyroidism. 3. OA 4. HTN 5. GERD 6. 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  7. Chronic systolic CHF: Echo (33/00) with EF 20-25%, diffuse hypokinesis, mild MR, moderately decreased RV systolic function, PASP  60 mmHg.  - Echo (11/17): EF 55%, septal-lateral dyssynchrony, normal RV size and systolic function.   FH: Son with MI.  SH: Single, lives alone in [place], nonsmoker, no ETOH.   ROS: All systems reviewed and negative except as per HPI.   Current Outpatient Prescriptions  Medication Sig Dispense Refill  . acetaminophen (TYLENOL) 500 MG tablet Take 500 mg by mouth every 6 (six) hours as needed for mild pain.     Marland Kitchen aspirin EC 81 MG tablet Take 1 tablet (81 mg total) by mouth daily. 30 tablet 6  . Calcium Carb-Cholecalciferol (CALCIUM 600+D) 600-800 MG-UNIT TABS Take 1 tablet by mouth 2 (two) times daily.    . calcium carbonate (TUMS - DOSED IN MG ELEMENTAL CALCIUM) 500 MG chewable tablet Chew 1 tablet by mouth daily as needed for indigestion or heartburn.    . carboxymethylcellulose (REFRESH PLUS) 0.5 % SOLN Place 1 drop into both eyes 3 (three) times daily as needed (dry eye).    . Cholecalciferol (VITAMIN D3) 5000 UNITS CAPS Take 1 capsule by mouth daily.    . furosemide (LASIX) 20 MG tablet TAKE 2 TABLETS BY MOUTH EVERY MORNING AND 1 TABLET EVERY OTHER DAY IN THE EVENING 240 tablet 2  . levothyroxine (SYNTHROID, LEVOTHROID) 88 MCG tablet TAKE 1 TABLET(88 MCG) BY MOUTH DAILY 90 tablet 0  . loperamide (IMODIUM A-D) 2 MG tablet Take 2 mg  by mouth 4 (four) times daily as needed for diarrhea or loose stools.    . metoprolol succinate (TOPROL-XL) 25 MG 24 hr tablet TAKE 1 TABLET BY MOUTH TWICE DAILY 180 tablet 2  . Multiple Vitamins-Minerals (PRESERVISION AREDS 2 PO) Take 2 tablets by mouth daily.     . pantoprazole (PROTONIX) 40 MG tablet Take 1 tablet (40 mg total) by mouth daily. 30 tablet 6  . Probiotic Product (PROBIOTIC ADVANCED PO) Take 500 mg by mouth daily.      No current facility-administered medications for this encounter.    BP (!) 180/64   Pulse 60   Ht 5\' 2"  (1.575 m)   Wt 144 lb 8 oz (65.5 kg)   SpO2 99%   BMI 26.43 kg/m  General: NAD Neck: JVP not elevated, no  thyromegaly or thyroid nodule.  Lungs: CTAB  CV: Nondisplaced PMI.  Heart regular S1/S2 with widely split S2, no S3/S4, 1/6 SEM RUSB.   No carotid bruit.   Abdomen: Soft, nontender, no hepatosplenomegaly, no distention.  Skin: Intact without lesions or rashes.  Neurologic: Alert and oriented x 3.  Psych: Normal affect. Extremities: No clubbing or cyanosis. 1+ ankle edema bilaterally.  HEENT: Normal.   Assessment/Plan: 1. Chronic systolic CHF: EF 39-53% with moderate RV dysfunction on echo in 10/16.  Etiology of the cardiomyopathy is uncertain: no chest pain or suggestion of CAD, however cannot rule out ischemic cardiomyopathy.  Symptoms noted at a time of significant stress with severe hip pain in 8/16, so cannot rule out Takotsubo CMP.  Viral myocarditis remains possible.  Echo in 11/17 showed recovery of EF to 55%. Therefore, it is possible that the original insult was a stress (Takotsubo-type) cardiomyopathy.  NYHA class II-III symptoms, volume status looks ok.  Her symptoms are mainly weakness and fatigue rather than dyspnea.  - Continue Lasix 40 qam/20 qpm.  - Continue Toprol XL 25 mg bid.  - Stay off lisinopril with CKD III-IV.  - Repeat echo in 11/18 to make sure that EF remains in normal range.  - Check BMET and BNP.  2. CKD: Stage III-IV.   BMET today. 3. Weakness/fatigue: This may be due to deconditioning (she is not very active).  She does not appear to have active CHF.  She is in NSR on ECG .  - Check CBC for anemia and check TSH.  - I encouraged increased activity.  4. HTN: BP very high today, generally better at home.  She will check her readings for 2 wks and call them in to me.   Followup in 6 months with echo.   Loralie Champagne 02/04/2017

## 2017-02-04 NOTE — Telephone Encounter (Signed)
-----   Message from Larey Dresser, MD sent at 02/03/2017 10:40 PM EDT ----- TSH is low and creatinine is higher.  Need to bring in for repeat TSH + free T4 + free T3.  ?hyperthyroidism.

## 2017-02-05 ENCOUNTER — Ambulatory Visit (HOSPITAL_COMMUNITY)
Admission: RE | Admit: 2017-02-05 | Discharge: 2017-02-05 | Disposition: A | Discharge status: Home / Self Care | Payer: PPO | Source: Ambulatory Visit | Attending: Internal Medicine | Admitting: Internal Medicine

## 2017-02-05 DIAGNOSIS — R7989 Other specified abnormal findings of blood chemistry: Secondary | ICD-10-CM

## 2017-02-05 DIAGNOSIS — R946 Abnormal results of thyroid function studies: Secondary | ICD-10-CM | POA: Insufficient documentation

## 2017-02-05 LAB — T4, FREE: Free T4: 1.23 ng/dL — ABNORMAL HIGH (ref 0.61–1.12)

## 2017-02-05 LAB — TSH: TSH: 0.202 u[IU]/mL — AB (ref 0.350–4.500)

## 2017-02-06 ENCOUNTER — Telehealth (HOSPITAL_COMMUNITY): Payer: Self-pay | Admitting: *Deleted

## 2017-02-06 LAB — T3, FREE: T3 FREE: 1.8 pg/mL — AB (ref 2.0–4.4)

## 2017-02-06 MED ORDER — LEVOTHYROXINE SODIUM 50 MCG PO TABS: 50.0000 ug | ORAL_TABLET | Freq: Every day | ORAL | 3 refills | Status: DC

## 2017-02-06 NOTE — Telephone Encounter (Signed)
-----   Message from Larey Dresser, MD sent at 02/05/2017  3:49 PM EDT ----- Hold off on endocrinology appt, she is on Levoxyl.  Decrease Levoxyl to 50 mcg daily and repeat TSH in 1 month.

## 2017-02-06 NOTE — Telephone Encounter (Signed)
rx for synthroid 67mcg sent to pharmacy.

## 2017-02-17 ENCOUNTER — Telehealth (HOSPITAL_COMMUNITY): Payer: Self-pay | Admitting: *Deleted

## 2017-02-17 NOTE — Telephone Encounter (Signed)
BP generally runs high.  Would try her on low dose amlodipine 2.5 daily.

## 2017-02-17 NOTE — Telephone Encounter (Signed)
Patient advised to call in blood pressure reqdings after 2 weeks.  Daughter called today with the following results.  5/22- 142/59 5/23- 153/74 5/24- 160/70 5/25- 137/58 5/26- 146/57 5/27- 144/53 5/28- 165/74 5/29- 149/52 5/30- 168/75 5/31- 146/57 6/1- 146/54 6/2- 159/58 6/3- 152/65 6/4- 145/54  I will route these to Dr. Aundra Dubin to review.

## 2017-02-18 MED ORDER — AMLODIPINE BESYLATE 2.5 MG PO TABS: 2.5000 mg | ORAL_TABLET | Freq: Every day | ORAL | 3 refills | Status: DC

## 2017-02-18 NOTE — Telephone Encounter (Signed)
Called patient's daughter back but had to leave message asking for her to return our call.

## 2017-02-18 NOTE — Telephone Encounter (Signed)
Daughter called back and she is agreeable with plan.  She will have patient start medication and will continue recording BPs and will call in results in 2 weeks. Medication sent to pharmacy. No further questions at this time.

## 2017-03-04 NOTE — Telephone Encounter (Signed)
Daughter called today with BP readings since starting patinet on Amlodipine 2.5 mg and readings ranged between 147/53-129/57.  She stated that she feels better and not as tired as before.    I advised her continue to taking the amlodipine as prescribed since her BP readings were stable and to just call us if anything were to change.  No further questions at this time.

## 2017-03-11 ENCOUNTER — Telehealth: Payer: Self-pay | Admitting: Internal Medicine

## 2017-03-11 NOTE — Telephone Encounter (Signed)
Pts daughter is calling to see if they could get an antibiotic for hoarseness and coughing.  Pharm: [facility] [address]

## 2017-03-12 NOTE — Telephone Encounter (Signed)
Please advise 

## 2017-03-12 NOTE — Telephone Encounter (Signed)
Acute bronchitis symptoms for less than 10 days are generally not helped by antibiotics.  Take over-the-counter expectorants and cough medications such as  Mucinex DM.  Call if there is no improvement in 5 to 7 days or if  you develop worsening cough, fever, or new symptoms, such as shortness of breath or chest pain.   

## 2017-03-13 NOTE — Telephone Encounter (Signed)
Spoke with pt and informed her that acute bronchitis symptoms for less than 10 days are generally not helped by antibiotics.  Take over-the-counter expectorants and cough medications such as  Mucinex DM.  Call if there is no improvement in 5 to 7 days or if  you develop worsening cough, fever, or new symptoms, such as shortness of breath or chest pain. Pt verbalized understanding.

## 2017-04-22 ENCOUNTER — Other Ambulatory Visit (HOSPITAL_COMMUNITY): Payer: Self-pay | Admitting: Cardiology

## 2017-04-22 MED ORDER — LEVOTHYROXINE SODIUM 50 MCG PO TABS: 50.0000 ug | ORAL_TABLET | Freq: Every day | ORAL | 3 refills | Status: DC

## 2017-05-06 ENCOUNTER — Other Ambulatory Visit (HOSPITAL_COMMUNITY): Payer: Self-pay | Admitting: Cardiology

## 2017-05-06 MED ORDER — AMLODIPINE BESYLATE 2.5 MG PO TABS: 2.5000 mg | ORAL_TABLET | Freq: Every day | ORAL | 3 refills | Status: DC

## 2017-05-24 ENCOUNTER — Other Ambulatory Visit: Payer: Self-pay | Admitting: Internal Medicine

## 2017-06-21 ENCOUNTER — Other Ambulatory Visit (HOSPITAL_COMMUNITY): Payer: Self-pay | Admitting: Cardiology

## 2017-06-24 ENCOUNTER — Other Ambulatory Visit (HOSPITAL_COMMUNITY): Payer: Self-pay | Admitting: *Deleted

## 2017-06-24 ENCOUNTER — Telehealth (HOSPITAL_COMMUNITY): Payer: Self-pay | Admitting: *Deleted

## 2017-06-24 MED ORDER — PANTOPRAZOLE SODIUM 40 MG PO TBEC: 40.0000 mg | DELAYED_RELEASE_TABLET | Freq: Every day | ORAL | 6 refills | Status: DC

## 2017-06-24 NOTE — Telephone Encounter (Signed)
Entered in error

## 2017-06-27 ENCOUNTER — Ambulatory Visit: Payer: PPO

## 2017-07-04 ENCOUNTER — Ambulatory Visit (INDEPENDENT_AMBULATORY_CARE_PROVIDER_SITE_OTHER): Payer: PPO | Admitting: Emergency Medicine

## 2017-07-04 DIAGNOSIS — Z23 Encounter for immunization: Secondary | ICD-10-CM | POA: Diagnosis not present

## 2017-07-13 ENCOUNTER — Other Ambulatory Visit (HOSPITAL_COMMUNITY): Payer: Self-pay | Admitting: Cardiology

## 2017-07-23 DIAGNOSIS — H43813 Vitreous degeneration, bilateral: Secondary | ICD-10-CM | POA: Diagnosis not present

## 2017-07-23 DIAGNOSIS — H353123 Nonexudative age-related macular degeneration, left eye, advanced atrophic without subfoveal involvement: Secondary | ICD-10-CM | POA: Diagnosis not present

## 2017-07-23 DIAGNOSIS — H35423 Microcystoid degeneration of retina, bilateral: Secondary | ICD-10-CM | POA: Diagnosis not present

## 2017-07-23 DIAGNOSIS — H353114 Nonexudative age-related macular degeneration, right eye, advanced atrophic with subfoveal involvement: Secondary | ICD-10-CM | POA: Diagnosis not present

## 2017-07-29 ENCOUNTER — Ambulatory Visit (HOSPITAL_BASED_OUTPATIENT_CLINIC_OR_DEPARTMENT_OTHER)
Admission: RE | Admit: 2017-07-29 | Discharge: 2017-07-29 | Disposition: A | Discharge status: Home / Self Care | Payer: PPO | Source: Ambulatory Visit | Attending: Cardiology | Admitting: Cardiology

## 2017-07-29 ENCOUNTER — Ambulatory Visit (HOSPITAL_COMMUNITY)
Admission: RE | Admit: 2017-07-29 | Discharge: 2017-07-29 | Disposition: A | Discharge status: Home / Self Care | Payer: PPO | Source: Ambulatory Visit | Attending: Cardiology | Admitting: Cardiology

## 2017-07-29 ENCOUNTER — Other Ambulatory Visit: Payer: Self-pay

## 2017-07-29 VITALS — BP 160/67 | HR 55 | Wt 141.5 lb

## 2017-07-29 DIAGNOSIS — I5022 Chronic systolic (congestive) heart failure: Secondary | ICD-10-CM

## 2017-07-29 DIAGNOSIS — I059 Rheumatic mitral valve disease, unspecified: Secondary | ICD-10-CM | POA: Insufficient documentation

## 2017-07-29 DIAGNOSIS — N183 Chronic kidney disease, stage 3 unspecified: Secondary | ICD-10-CM

## 2017-07-29 DIAGNOSIS — I130 Hypertensive heart and chronic kidney disease with heart failure and stage 1 through stage 4 chronic kidney disease, or unspecified chronic kidney disease: Secondary | ICD-10-CM | POA: Diagnosis not present

## 2017-07-29 DIAGNOSIS — Z853 Personal history of malignant neoplasm of breast: Secondary | ICD-10-CM | POA: Insufficient documentation

## 2017-07-29 DIAGNOSIS — N184 Chronic kidney disease, stage 4 (severe): Secondary | ICD-10-CM | POA: Insufficient documentation

## 2017-07-29 DIAGNOSIS — Z7982 Long term (current) use of aspirin: Secondary | ICD-10-CM | POA: Insufficient documentation

## 2017-07-29 DIAGNOSIS — E039 Hypothyroidism, unspecified: Secondary | ICD-10-CM | POA: Diagnosis not present

## 2017-07-29 DIAGNOSIS — K219 Gastro-esophageal reflux disease without esophagitis: Secondary | ICD-10-CM | POA: Insufficient documentation

## 2017-07-29 DIAGNOSIS — I509 Heart failure, unspecified: Secondary | ICD-10-CM | POA: Diagnosis present

## 2017-07-29 DIAGNOSIS — Z79899 Other long term (current) drug therapy: Secondary | ICD-10-CM | POA: Diagnosis not present

## 2017-07-29 LAB — BASIC METABOLIC PANEL
Anion gap: 10 (ref 5–15)
BUN: 73 mg/dL — AB (ref 6–20)
CHLORIDE: 102 mmol/L (ref 101–111)
CO2: 24 mmol/L (ref 22–32)
Calcium: 8.8 mg/dL — ABNORMAL LOW (ref 8.9–10.3)
Creatinine, Ser: 3.11 mg/dL — ABNORMAL HIGH (ref 0.44–1.00)
GFR calc Af Amer: 14 mL/min — ABNORMAL LOW (ref >60)
GFR calc non Af Amer: 12 mL/min — ABNORMAL LOW (ref >60)
GLUCOSE: 97 mg/dL (ref 65–99)
POTASSIUM: 4.4 mmol/L (ref 3.5–5.1)
Sodium: 136 mmol/L (ref 135–145)

## 2017-07-29 LAB — CBC
HEMATOCRIT: 32 % — AB (ref 36.0–46.0)
HEMOGLOBIN: 9.9 g/dL — AB (ref 12.0–15.0)
MCH: 27.5 pg (ref 26.0–34.0)
MCHC: 30.9 g/dL (ref 30.0–36.0)
MCV: 88.9 fL (ref 78.0–100.0)
Platelets: 326 10*3/uL (ref 150–400)
RBC: 3.6 MIL/uL — ABNORMAL LOW (ref 3.87–5.11)
RDW: 15.2 % (ref 11.5–15.5)
WBC: 6.1 10*3/uL (ref 4.0–10.5)

## 2017-07-29 LAB — TSH: TSH: 12.036 u[IU]/mL — ABNORMAL HIGH (ref 0.350–4.500)

## 2017-07-29 NOTE — Patient Instructions (Signed)
Labs today  We will contact you in 6 months to schedule your next appointment.  If you do not hear from [place] by May 2019 please call our office to schedule

## 2017-07-29 NOTE — Progress Notes (Signed)
  Echocardiogram 2D Echocardiogram has been performed.  Mary Guzman 07/29/2017, 11:59 AM

## 2017-07-30 ENCOUNTER — Telehealth (HOSPITAL_COMMUNITY): Payer: Self-pay

## 2017-07-30 DIAGNOSIS — E039 Hypothyroidism, unspecified: Secondary | ICD-10-CM

## 2017-07-30 MED ORDER — LEVOTHYROXINE SODIUM 75 MCG PO TABS: 75.0000 ug | ORAL_TABLET | Freq: Every day | ORAL | 3 refills | Status: DC

## 2017-07-30 NOTE — Telephone Encounter (Signed)
Notes recorded by Shirley Muscat, RN on 07/30/2017 at 1:53 PM EST Pt daughter Pt aware of results and agreeable to med changes. Orders placed in epic  ------  Notes recorded by Larey Dresser, MD on 07/29/2017 at 4:18 PM EST Increase Levoxyl to 75 mcg daily, recheck TSH in 1 month.

## 2017-07-30 NOTE — Progress Notes (Signed)
Patient ID: Mary Guzman, female   DOB: Mar 19, 1921, 83 y.o.   MRN: [id number] PCP: Dr Burnice Logan Cardiology: Dr. Aundra Dubin  83 yo with history of breast cancer in the 1990s, HTN, and CKD presents for followup of CHF. She lives alone but daughter helps.  She dates her symptoms to around the time when she developed severe hip pain in 8/16. She was under a lot of stress with the severe pain and ended up getting steroid injections.  She developed exertional dyspnea around that time.  However, for > 1 year she has had significant lower extremity edema.  She got to the point where she was short of breath after walking about 15-20 feet.  No chest pain.  CXR in 9/16 showed mild pulmonary edema.  This improved with increase in Lasix to 40 qam and every other day 20 mg pm Lasix.  Repeat echo was done in 11/17.  This showed improvement in EF to 55%.  Echo in 11/18 showed that EF remains 55%.   She is stable symptomatically.  BP high today but SBP runs 120s-140 at home.  She uses a walker outside the house.  No dyspnea walking around house.  Mild dyspnea walking longer distances.  No chest pain.  No lightheadedness or falls.  No palpitations.    Labs (9/16): pro-BNP > 5000, TSH normal Labs (10/16): K 4.5, creatinine 1.68 Labs (11/16): K 4.7, creatinine 1.67 Labs (12/16): K 4.9, creatinine 1.86 Labs (5/17): K 4.9, creatinine 2.1, BNP 373, TSH 0.98 (normal) Labs 01/25/2016: K 4.5 Creatinine 2.37 Labs (6/17): K 4.2, creatinine 2.16 Labs (11/17): K 4.6, creatinine 2.39, HCT 33.7 Labs (5/18): BNP 282, K 4.8, creatinine 3  PMH: 1. Breast cancer: On left, s/p mastectomy in 1994 along with radiation treatment.  No chemotherapy.  2. Hypothyroidism. 3. OA 4. HTN 5. GERD 6. 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  7. Chronic systolic CHF: Echo (14/48) with EF 20-25%, diffuse hypokinesis, mild MR, moderately decreased RV systolic function, PASP 60 mmHg.  - Echo (11/17): EF 55%, septal-lateral dyssynchrony, normal RV size and systolic  function.  - Echo (11/18): EF 55%, moderate LVH, septal-lateral dyssynchrony, normal RV size and systolic function.   FH: Son with MI.  SH: Single, lives alone in [place], nonsmoker, no ETOH.   ROS: All systems reviewed and negative except as per HPI.   Current Outpatient Medications  Medication Sig Dispense Refill  . acetaminophen (TYLENOL) 500 MG tablet Take 500 mg by mouth every 6 (six) hours as needed for mild pain.     Marland Kitchen amLODipine (NORVASC) 2.5 MG tablet Take 1 tablet (2.5 mg total) by mouth daily. 90 tablet 3  . aspirin EC 81 MG tablet Take 1 tablet (81 mg total) by mouth daily. 30 tablet 6  . Calcium Carb-Cholecalciferol (CALCIUM 600+D) 600-800 MG-UNIT TABS Take 1 tablet by mouth 2 (two) times daily.    . calcium carbonate (TUMS - DOSED IN MG ELEMENTAL CALCIUM) 500 MG chewable tablet Chew 1 tablet by mouth daily as needed for indigestion or heartburn.    . carboxymethylcellulose (REFRESH PLUS) 0.5 % SOLN Place 1 drop into both eyes 3 (three) times daily as needed (dry eye).    . Cholecalciferol (VITAMIN D3) 5000 UNITS CAPS Take 1 capsule by mouth daily.    . furosemide (LASIX) 20 MG tablet TAKE 2 TABLETS BY MOUTH EVERY MORNING AND 1 TABLET EVERY OTHER DAY IN THE EVENING 240 tablet 0  . levothyroxine (SYNTHROID, LEVOTHROID) 50 MCG tablet Take 1 tablet (50 mcg  total) by mouth daily before breakfast. 30 tablet 3  . loperamide (IMODIUM A-D) 2 MG tablet Take 2 mg by mouth 4 (four) times daily as needed for diarrhea or loose stools.    . metoprolol succinate (TOPROL-XL) 25 MG 24 hr tablet TAKE 1 TABLET BY MOUTH TWICE DAILY 180 tablet 0  . Multiple Vitamins-Minerals (PRESERVISION AREDS 2 PO) Take 2 tablets by mouth daily.     . pantoprazole (PROTONIX) 40 MG tablet Take 1 tablet (40 mg total) by mouth daily. 30 tablet 6  . Probiotic Product (PROBIOTIC ADVANCED PO) Take 500 mg by mouth daily.      No current facility-administered medications for this encounter.    BP (!) 160/67   Pulse  (!) 55   Wt 141 lb 8 oz (64.2 kg)   SpO2 94%   BMI 25.88 kg/m  General: 83, frail Neck: No JVD, no thyromegaly or thyroid nodule.  Lungs: Clear to auscultation bilaterally with normal respiratory effort. CV: Nondisplaced PMI.  Heart regular S1/S2 with widely split S2, no S3/S4, no murmur.  Trace ankle edema.  No carotid bruit.  Normal pedal pulses.  Abdomen: Soft, nontender, no hepatosplenomegaly, no distention.  Skin: Intact without lesions or rashes.  Neurologic: Alert and oriented x 3.  Psych: Normal affect. Extremities: No clubbing or cyanosis.  HEENT: Normal.   Assessment/Plan: 1. Chronic systolic CHF: EF 94-49% with moderate RV dysfunction on echo in 10/16.  Etiology of the cardiomyopathy is uncertain: no chest pain or suggestion of CAD, however cannot rule out ischemic cardiomyopathy.  Symptoms noted at a time of significant stress with severe hip pain in 8/16, so cannot rule out Takotsubo CMP.  Viral myocarditis remains possible.  Echo in 11/17 showed recovery of EF to 55%.  I reviewed today's echo, which shows that EF remains 55%. Therefore, it is possible that the original insult was a stress (Takotsubo-type) cardiomyopathy.  NYHA class II-III symptoms, volume status looks ok.  - Continue Lasix 40 qd alternating with 40 qam/20 qpm.  BMET today.  - Continue Toprol XL 25 mg bid.  - Stay off ACEI/ARB/ARNI/spironolactone with CKD IV.  2. CKD: Stage IV.   BMET today. 3. HTN: BP high today, generally better at home.  Continue amlodipine 2.5 mg daily.   Followup in 6 months   Loralie Champagne 07/30/2017

## 2017-08-04 ENCOUNTER — Other Ambulatory Visit (HOSPITAL_COMMUNITY): Payer: Self-pay | Admitting: Pharmacist

## 2017-08-04 MED ORDER — LEVOTHYROXINE SODIUM 75 MCG PO TABS: 75.0000 ug | ORAL_TABLET | Freq: Every day | ORAL | 3 refills | Status: DC

## 2017-08-05 ENCOUNTER — Ambulatory Visit: Payer: PPO | Admitting: Internal Medicine

## 2017-08-12 ENCOUNTER — Ambulatory Visit: Payer: PPO | Admitting: Internal Medicine

## 2017-08-12 ENCOUNTER — Encounter: Payer: Self-pay | Admitting: Internal Medicine

## 2017-08-12 VITALS — BP 150/68 | HR 64 | Temp 97.9°F | Ht 62.0 in | Wt 142.8 lb

## 2017-08-12 DIAGNOSIS — N183 Chronic kidney disease, stage 3 unspecified: Secondary | ICD-10-CM

## 2017-08-12 DIAGNOSIS — I10 Essential (primary) hypertension: Secondary | ICD-10-CM | POA: Diagnosis not present

## 2017-08-12 DIAGNOSIS — E038 Other specified hypothyroidism: Secondary | ICD-10-CM

## 2017-08-12 DIAGNOSIS — I5022 Chronic systolic (congestive) heart failure: Secondary | ICD-10-CM | POA: Diagnosis not present

## 2017-08-12 NOTE — Patient Instructions (Signed)
Limit your sodium (Salt) intake  Return in 6 months for follow-up  

## 2017-08-12 NOTE — Progress Notes (Signed)
Subjective:    Patient ID: Mary Guzman, female    DOB: [birth year], 83 y.o.   MRN: [id number]  HPI  83 year old patient who is seen today for her biannual follow-up. She has had a recent cardiology follow-up and her levothyroxine dose titrated.  She is doing well today.  She does have a history of essential hypertension and hypothyroidism She has chronic kidney disease as well as compensated chronic systolic heart failure.  She has had a recent 2D echocardiogram.  She generally feels well today  Past Medical History:  Diagnosis Date  . Cancer ([place])    Breast Hx of stage IIB, moderately differentiated with 3 of 8 possible lymph nodes.  . DJD (degenerative joint disease)   . GERD (gastroesophageal reflux disease)   . Hypertension   . Osteoporosis   . Thyroid disease    Hypothyroidism     Social History   Socioeconomic History  . Marital status: Widowed    Spouse name: Not on file  . Number of children: Not on file  . Years of education: Not on file  . Highest education level: Not on file  Social Needs  . Financial resource strain: Not on file  . Food insecurity - worry: Not on file  . Food insecurity - inability: Not on file  . Transportation needs - medical: Not on file  . Transportation needs - non-medical: Not on file  Occupational History  . Not on file  Tobacco Use  . Smoking status: Never Smoker  . Smokeless tobacco: Current User    Types: Chew  Substance and Sexual Activity  . Alcohol use: No  . Drug use: Not on file  . Sexual activity: Not on file  Other Topics Concern  . Not on file  Social History Narrative   Fairly active interaction with grandchildren.    Past Surgical History:  Procedure Laterality Date  . ANKLE SURGERY     Right  . INTRACAPSULAR CATARACT EXTRACTION    . MASTECTOMY  1994    Family History  Problem Relation Age of Onset  . Alcohol abuse Brother   . Asthma Son     No Known Allergies  Current Outpatient Medications on  File Prior to Visit  Medication Sig Dispense Refill  . acetaminophen (TYLENOL) 500 MG tablet Take 500 mg by mouth every 6 (six) hours as needed for mild pain.     Marland Kitchen amLODipine (NORVASC) 2.5 MG tablet Take 1 tablet (2.5 mg total) by mouth daily. 90 tablet 3  . aspirin EC 81 MG tablet Take 1 tablet (81 mg total) by mouth daily. 30 tablet 6  . Calcium Carb-Cholecalciferol (CALCIUM 600+D) 600-800 MG-UNIT TABS Take 1 tablet by mouth 2 (two) times daily.    . calcium carbonate (TUMS - DOSED IN MG ELEMENTAL CALCIUM) 500 MG chewable tablet Chew 1 tablet by mouth daily as needed for indigestion or heartburn.    . carboxymethylcellulose (REFRESH PLUS) 0.5 % SOLN Place 1 drop into both eyes 3 (three) times daily as needed (dry eye).    . Cholecalciferol (VITAMIN D3) 5000 UNITS CAPS Take 1 capsule by mouth daily.    . furosemide (LASIX) 20 MG tablet TAKE 2 TABLETS BY MOUTH EVERY MORNING AND 1 TABLET EVERY OTHER DAY IN THE EVENING 240 tablet 0  . levothyroxine (SYNTHROID, LEVOTHROID) 75 MCG tablet Take 1 tablet (75 mcg total) daily before breakfast by mouth. 90 tablet 3  . loperamide (IMODIUM A-D) 2 MG tablet Take 2 mg by  mouth 4 (four) times daily as needed for diarrhea or loose stools.    . metoprolol succinate (TOPROL-XL) 25 MG 24 hr tablet TAKE 1 TABLET BY MOUTH TWICE DAILY 180 tablet 0  . Multiple Vitamins-Minerals (PRESERVISION AREDS 2 PO) Take 2 tablets by mouth daily.     . pantoprazole (PROTONIX) 40 MG tablet Take 1 tablet (40 mg total) by mouth daily. 30 tablet 6  . Probiotic Product (PROBIOTIC ADVANCED PO) Take 500 mg by mouth daily.      No current facility-administered medications on file prior to visit.     BP (!) 150/68 (BP Location: Left Arm, Patient Position: Sitting, Cuff Size: Normal)   Pulse 64   Temp 97.9 F (36.6 C) (Oral)   Ht 5\' 2"  (1.575 m)   Wt 142 lb 12.8 oz (64.8 kg)   BMI 26.12 kg/m      Review of Systems  Constitutional: Negative.   HENT: Negative for congestion,  dental problem, hearing loss, rhinorrhea, sinus pressure, sore throat and tinnitus.   Eyes: Negative for pain, discharge and visual disturbance.  Respiratory: Negative for cough and shortness of breath.   Cardiovascular: Negative for chest pain, palpitations and leg swelling.  Gastrointestinal: Negative for abdominal distention, abdominal pain, blood in stool, constipation, diarrhea, nausea and vomiting.  Genitourinary: Negative for difficulty urinating, dysuria, flank pain, frequency, hematuria, pelvic pain, urgency, vaginal bleeding, vaginal discharge and vaginal pain.  Musculoskeletal: Negative for arthralgias, gait problem and joint swelling.  Skin: Negative for rash.  Neurological: Negative for dizziness, syncope, speech difficulty, weakness, numbness and headaches.  Hematological: Negative for adenopathy.  Psychiatric/Behavioral: Negative for agitation, behavioral problems and dysphoric mood. The patient is not nervous/anxious.        Objective:   Physical Exam  Constitutional: She is oriented to person, place, and time. She appears well-developed and well-nourished.  Blood pressure 142/64  HENT:  Head: Normocephalic.  Right Ear: External ear normal.  Left Ear: External ear normal.  Mouth/Throat: Oropharynx is clear and moist.  Eyes: Conjunctivae and EOM are normal. Pupils are equal, round, and reactive to light.  Neck: Normal range of motion. Neck supple. No thyromegaly present.  Cardiovascular: Normal rate, regular rhythm, normal heart sounds and intact distal pulses.  Pulmonary/Chest: Effort normal and breath sounds normal.  Abdominal: Soft. Bowel sounds are normal. She exhibits no mass. There is no tenderness.  Musculoskeletal: Normal range of motion.  Lymphadenopathy:    She has no cervical adenopathy.  Neurological: She is alert and oriented to person, place, and time.  Skin: Skin is warm and dry. No rash noted.  Psychiatric: She has a normal mood and affect. Her behavior  is normal.          Assessment & Plan:   Hypothyroidism.  Levothyroxine recently uptitrated due to elevated TSH.  Follow-up TSH has been ordered per cardiology Compensated chronic systolic heart failure Osteoarthritis Essential hypertension stable Chronic kidney disease  No change in medical regimen Follow-up 6 months with lab Cardiology follow-up as scheduled  [name]

## 2017-09-02 ENCOUNTER — Ambulatory Visit (HOSPITAL_COMMUNITY)
Admission: RE | Admit: 2017-09-02 | Discharge: 2017-09-02 | Disposition: A | Discharge status: Home / Self Care | Payer: PPO | Source: Ambulatory Visit | Attending: Internal Medicine | Admitting: Internal Medicine

## 2017-09-02 DIAGNOSIS — E039 Hypothyroidism, unspecified: Secondary | ICD-10-CM | POA: Diagnosis not present

## 2017-09-02 LAB — TSH: TSH: 2.599 u[IU]/mL (ref 0.350–4.500)

## 2017-09-21 ENCOUNTER — Other Ambulatory Visit (HOSPITAL_COMMUNITY): Payer: Self-pay | Admitting: Cardiology

## 2017-09-22 ENCOUNTER — Ambulatory Visit (INDEPENDENT_AMBULATORY_CARE_PROVIDER_SITE_OTHER)
Admission: RE | Admit: 2017-09-22 | Discharge: 2017-09-22 | Disposition: A | Discharge status: Home / Self Care | Payer: PPO | Source: Ambulatory Visit | Attending: Family Medicine | Admitting: Family Medicine

## 2017-09-22 ENCOUNTER — Ambulatory Visit (INDEPENDENT_AMBULATORY_CARE_PROVIDER_SITE_OTHER): Payer: PPO | Admitting: Family Medicine

## 2017-09-22 ENCOUNTER — Encounter: Payer: Self-pay | Admitting: Family Medicine

## 2017-09-22 VITALS — BP 122/70 | HR 66 | Temp 98.4°F | Resp 16 | Ht 62.0 in

## 2017-09-22 DIAGNOSIS — R05 Cough: Secondary | ICD-10-CM

## 2017-09-22 DIAGNOSIS — J069 Acute upper respiratory infection, unspecified: Secondary | ICD-10-CM | POA: Diagnosis not present

## 2017-09-22 DIAGNOSIS — R059 Cough, unspecified: Secondary | ICD-10-CM

## 2017-09-22 DIAGNOSIS — R0989 Other specified symptoms and signs involving the circulatory and respiratory systems: Secondary | ICD-10-CM

## 2017-09-22 DIAGNOSIS — J989 Respiratory disorder, unspecified: Secondary | ICD-10-CM

## 2017-09-22 MED ORDER — ALBUTEROL SULFATE HFA 108 (90 BASE) MCG/ACT IN AERS: 2.0000 | INHALATION_SPRAY | Freq: Four times a day (QID) | RESPIRATORY_TRACT | 0 refills | Status: DC | PRN

## 2017-09-22 MED ORDER — BENZONATATE 100 MG PO CAPS: 200.0000 mg | ORAL_CAPSULE | Freq: Two times a day (BID) | ORAL | 0 refills | Status: AC | PRN

## 2017-09-22 NOTE — Patient Instructions (Signed)
  [name] I have seen you today for an acute visit.  A few things to remember from today's visit:   Cough - Plan: DG Chest 2 View  URI, acute - Plan: benzonatate (TESSALON) 100 MG capsule  Reactive airway disease that is not asthma - Plan: albuterol (PROVENTIL HFA;VENTOLIN HFA) 108 (90 Base) MCG/ACT inhaler  Today X ray was ordered.  This can be done at [facility] between 8 am and 5 pm: [place]. [phone].  viral infections are self-limited and we treat each symptom depending of severity.  Over the counter medications as decongestants and cold medications usually help, they need to be taken with caution if there is a history of high blood pressure or palpitations.SO NO RECOMMENDED. PLAIN mUCINEX MAY HELP.  Tylenol and/or Ibuprofen also helps with most symptoms (headache, muscle aching, fever,etc) Plenty of fluids. Honey helps with cough. Steam inhalations helps with runny nose, nasal congestion, and may prevent sinus infections. Cough and nasal congestion could last a few days and sometimes weeks. Please follow in not any better in 1-2 weeks or if symptoms get worse.  Albuterol inh 2 puff every 6 hours for a week then as needed for wheezing or shortness of breath.     In general please monitor for signs of worsening symptoms and seek immediate medical attention if any concerning.   I hope you get better soon!

## 2017-09-22 NOTE — Progress Notes (Signed)
ACUTE VISIT  HPI:  Chief Complaint  Patient presents with  . Cough    started last Thursday  . Nasal Congestion  . Fatigue    Mary Guzman is a 84 y.o.female here today with her daughter complaining of 4 days of respiratory symptoms.  She states that she feels "weak",no energy. Daughter has not noted MS changes or unstable gait,she has not had recent falls and denies focal deficit. She has history of CHF and CKD III. She has had chills but no fever.  Occasional wheezing, usually at rest.  She is not sure about exacerbating or alleviating factors, she has not been on.  Inhaler. No history of smoking, she chews tobacco.  Cough  This is a new problem. The problem has been unchanged. The cough is non-productive. Associated symptoms include chills, nasal congestion, postnasal drip, rhinorrhea, a sore throat and wheezing. Pertinent negatives include no chest pain, ear congestion, ear pain, eye redness, fever, headaches, heartburn, hemoptysis, myalgias, rash, shortness of breath or sweats. Nothing aggravates the symptoms. She has tried OTC cough suppressant for the symptoms. The treatment provided mild relief. There is no history of environmental allergies.    Non productive cough, Occasionally she has "little bit of phlegm." Has not noted urinary symptoms, abdominal pain, nausea, or vomiting. She had an episode of diarrhea yesterday.  No Hx of recent travel. No sick contact. No known insect bite.   OTC medications for this problem: Mucus Relief DM  Symptoms otherwise stable.   Review of Systems  Constitutional: Positive for appetite change, chills and fatigue. Negative for activity change and fever.  HENT: Positive for congestion, postnasal drip, rhinorrhea and sore throat. Negative for ear pain, mouth sores, sinus pain, trouble swallowing and voice change.   Eyes: Negative for discharge, redness and itching.  Respiratory: Positive for cough and wheezing.  Negative for hemoptysis and shortness of breath.   Cardiovascular: Negative for chest pain and leg swelling.  Gastrointestinal: Positive for diarrhea ("little bit" yesterday.). Negative for abdominal pain, heartburn, nausea and vomiting.  Genitourinary: Negative for decreased urine volume and hematuria.  Musculoskeletal: Positive for gait problem (stable). Negative for myalgias and neck pain.  Skin: Negative for pallor and rash.  Allergic/Immunologic: Negative for environmental allergies.  Neurological: Negative for syncope, facial asymmetry and headaches.  Hematological: Negative for adenopathy. Does not bruise/bleed easily.  Psychiatric/Behavioral: Positive for sleep disturbance. Negative for confusion. The patient is nervous/anxious.       Current Outpatient Medications on File Prior to Visit  Medication Sig Dispense Refill  . acetaminophen (TYLENOL) 500 MG tablet Take 500 mg by mouth every 6 (six) hours as needed for mild pain.     Marland Kitchen amLODipine (NORVASC) 2.5 MG tablet Take 1 tablet (2.5 mg total) by mouth daily. 90 tablet 3  . aspirin EC 81 MG tablet Take 1 tablet (81 mg total) by mouth daily. 30 tablet 6  . Calcium Carb-Cholecalciferol (CALCIUM 600+D) 600-800 MG-UNIT TABS Take 1 tablet by mouth 2 (two) times daily.    . calcium carbonate (TUMS - DOSED IN MG ELEMENTAL CALCIUM) 500 MG chewable tablet Chew 1 tablet by mouth daily as needed for indigestion or heartburn.    . carboxymethylcellulose (REFRESH PLUS) 0.5 % SOLN Place 1 drop into both eyes 3 (three) times daily as needed (dry eye).    . Cholecalciferol (VITAMIN D3) 5000 UNITS CAPS Take 1 capsule by mouth daily.    . furosemide (LASIX) 20 MG tablet TAKE 2  TABLETS BY MOUTH EVERY MORNING AND 1 TABLET EVERY OTHER DAY IN THE EVENING 240 tablet 0  . levothyroxine (SYNTHROID, LEVOTHROID) 75 MCG tablet Take 1 tablet (75 mcg total) daily before breakfast by mouth. 90 tablet 3  . loperamide (IMODIUM A-D) 2 MG tablet Take 2 mg by mouth 4  (four) times daily as needed for diarrhea or loose stools.    . metoprolol succinate (TOPROL-XL) 25 MG 24 hr tablet TAKE 1 TABLET BY MOUTH TWICE DAILY 180 tablet 0  . Multiple Vitamins-Minerals (PRESERVISION AREDS 2 PO) Take 2 tablets by mouth daily.     . pantoprazole (PROTONIX) 40 MG tablet Take 1 tablet (40 mg total) by mouth daily. 30 tablet 6  . Probiotic Product (PROBIOTIC ADVANCED PO) Take 500 mg by mouth daily.      No current facility-administered medications on file prior to visit.      Past Medical History:  Diagnosis Date  . Cancer ([place])    Breast Hx of stage IIB, moderately differentiated with 3 of 8 possible lymph nodes.  . DJD (degenerative joint disease)   . GERD (gastroesophageal reflux disease)   . Hypertension   . Osteoporosis   . Thyroid disease    Hypothyroidism   No Known Allergies  Social History   Socioeconomic History  . Marital status: Widowed    Spouse name: None  . Number of children: None  . Years of education: None  . Highest education level: None  Social Needs  . Financial resource strain: None  . Food insecurity - worry: None  . Food insecurity - inability: None  . Transportation needs - medical: None  . Transportation needs - non-medical: None  Occupational History  . None  Tobacco Use  . Smoking status: Never Smoker  . Smokeless tobacco: Current User    Types: Chew  Substance and Sexual Activity  . Alcohol use: No  . Drug use: None  . Sexual activity: None  Other Topics Concern  . None  Social History Narrative   Fairly active interaction with grandchildren.    Vitals:   09/22/17 1206  BP: 122/70  Pulse: 66  Resp: 16  Temp: 98.4 F (36.9 C)  SpO2: 98%   Body mass index is 26.12 kg/m.   Physical Exam  Nursing note and vitals reviewed. Constitutional: She is oriented to person, place, and time. She appears well-developed and well-nourished. She does not appear ill. No distress.  HENT:  Head: Normocephalic and  atraumatic.  Right Ear: Tympanic membrane, external ear and ear canal normal.  Left Ear: Tympanic membrane, external ear and ear canal normal.  Nose: Rhinorrhea present. Right sinus exhibits no maxillary sinus tenderness and no frontal sinus tenderness. Left sinus exhibits no maxillary sinus tenderness and no frontal sinus tenderness.  Mouth/Throat: Oropharynx is clear and moist and mucous membranes are normal.  Residues of tobacco on tongue.   Eyes: Conjunctivae are normal.  Neck: No muscular tenderness present. No edema and no erythema present.  Cardiovascular: Normal rate and regular rhythm.  No murmur heard. Respiratory: Effort normal and breath sounds normal. No stridor. No respiratory distress. She has no wheezes. She has no rales.  Persistent non productive cough during OV. Prolonged expiration.   GI: Soft. There is no tenderness.  Musculoskeletal: She exhibits no edema.  Lymphadenopathy:    She has no cervical adenopathy.  Neurological: She is alert and oriented to person, place, and time. She has normal strength.  Stable gait assisted by a walker.  Skin: Skin is warm. No rash noted. No erythema.  Psychiatric: She has a normal mood and affect. Her speech is normal.  Well groomed, good eye contact.     ASSESSMENT AND PLAN:  Mary Guzman was seen today for cough, nasal congestion and fatigue.  Diagnoses and all orders for this visit:  Reactive airway disease that is not asthma  Lung auscultation today neg for rales or wheezing. Albuterol inh 2 puff every 6 hours for a week then as needed for wheezing or shortness of breath.  Instructed about warning signs. We will give further recommendations in regard to follow-up after imaging result is back.  -     albuterol (PROVENTIL HFA;VENTOLIN HFA) 108 (90 Base) MCG/ACT inhaler; Inhale 2 puffs into the lungs every 6 (six) hours as needed for wheezing or shortness of breath.  Cough  Explained that cough and congestion can last a  few days and even weeks. Adequate hydration on OTC plain Mucinex might help.  -     DG Chest 2 View; Future -     benzonatate (TESSALON) 100 MG capsule; Take 2 capsules (200 mg total) by mouth 2 (two) times daily as needed for up to 10 days.  URI, acute  Symptoms suggests a viral etiology.  I explained patient and her daughter that symptomatic treatment is usually recommended in this case but because her age and medical history, we could start empiric antibiotic treatment.  After discussion of some side effects, including GI symptoms, she and her daughter prefer to hold on antibiotics for now.  Instructed to monitor for signs of complications, including new onset of fever among some, clearly instructed about warning signs.  F/U as needed and depending on CXR results.    -Ms. [name] advised to seek attention immediately if symptoms worsen.     Miracle Criado G. [place], MD  [facility]. [place] office.

## 2017-09-26 ENCOUNTER — Other Ambulatory Visit (HOSPITAL_COMMUNITY): Payer: Self-pay | Admitting: *Deleted

## 2017-09-26 MED ORDER — LEVOTHYROXINE SODIUM 75 MCG PO TABS: 75.0000 ug | ORAL_TABLET | Freq: Every day | ORAL | 3 refills | Status: DC

## 2017-09-30 ENCOUNTER — Ambulatory Visit (INDEPENDENT_AMBULATORY_CARE_PROVIDER_SITE_OTHER): Payer: PPO | Admitting: Internal Medicine

## 2017-09-30 ENCOUNTER — Encounter: Payer: Self-pay | Admitting: Internal Medicine

## 2017-09-30 VITALS — BP 138/52 | Temp 98.5°F | Ht 62.0 in | Wt 138.2 lb

## 2017-09-30 DIAGNOSIS — M150 Primary generalized (osteo)arthritis: Secondary | ICD-10-CM | POA: Diagnosis not present

## 2017-09-30 DIAGNOSIS — B9789 Other viral agents as the cause of diseases classified elsewhere: Secondary | ICD-10-CM

## 2017-09-30 DIAGNOSIS — J069 Acute upper respiratory infection, unspecified: Secondary | ICD-10-CM

## 2017-09-30 DIAGNOSIS — I10 Essential (primary) hypertension: Secondary | ICD-10-CM

## 2017-09-30 DIAGNOSIS — M159 Polyosteoarthritis, unspecified: Secondary | ICD-10-CM

## 2017-09-30 NOTE — Patient Instructions (Addendum)
Acute bronchitis symptoms are generally not helped by antibiotics.  Take over-the-counter expectorants and cough medications such as  Mucinex DM.  Call if there is no improvement in 5 to 7 days or if  you develop worsening cough, fever, or new symptoms, such as shortness of breath or chest pain.  Hydrate and Humidify  Drink enough water to keep your urine clear or pale yellow. Staying hydrated will help to thin your mucus.  Use a cool mist humidifier to keep the humidity level in your home above 50%.  Inhale steam for 10-15 minutes, 3-4 times a day or as told by your health care provider. You can do this in the bathroom while a hot shower is running.  Limit your exposure to cool or dry air. Rest  Rest as much as possible.

## 2017-09-30 NOTE — Progress Notes (Signed)
Subjective:    Patient ID: Mary Guzman, female    DOB: 22-Sep-1920, 84 y.o.   MRN: [id number]  HPI  84 year old patient who presents today for follow-up.  She was seen 8 days ago complaining of cough congestion and weakness.  She was treated symptomatically.  A chest x-ray revealed no acute disease.  Denies any fever chills purulent productive cough or shortness of breath.  Denies any chest pain her chief complaint is weakness and nonproductive cough She has been using expectorants as well as Ladona Ridgel  Past Medical History:  Diagnosis Date  . Cancer ([place])    Breast Hx of stage IIB, moderately differentiated with 3 of 8 possible lymph nodes.  . DJD (degenerative joint disease)   . GERD (gastroesophageal reflux disease)   . Hypertension   . Osteoporosis   . Thyroid disease    Hypothyroidism     Social History   Socioeconomic History  . Marital status: Widowed    Spouse name: Not on file  . Number of children: Not on file  . Years of education: Not on file  . Highest education level: Not on file  Social Needs  . Financial resource strain: Not on file  . Food insecurity - worry: Not on file  . Food insecurity - inability: Not on file  . Transportation needs - medical: Not on file  . Transportation needs - non-medical: Not on file  Occupational History  . Not on file  Tobacco Use  . Smoking status: Never Smoker  . Smokeless tobacco: Current User    Types: Chew  Substance and Sexual Activity  . Alcohol use: No  . Drug use: Not on file  . Sexual activity: Not on file  Other Topics Concern  . Not on file  Social History Narrative   Fairly active interaction with grandchildren.    Past Surgical History:  Procedure Laterality Date  . ANKLE SURGERY     Right  . INTRACAPSULAR CATARACT EXTRACTION    . MASTECTOMY  1994    Family History  Problem Relation Age of Onset  . Alcohol abuse Brother   . Asthma Son     No Known Allergies  Current Outpatient  Medications on File Prior to Visit  Medication Sig Dispense Refill  . acetaminophen (TYLENOL) 500 MG tablet Take 500 mg by mouth every 6 (six) hours as needed for mild pain.     Marland Kitchen albuterol (PROVENTIL HFA;VENTOLIN HFA) 108 (90 Base) MCG/ACT inhaler Inhale 2 puffs into the lungs every 6 (six) hours as needed for wheezing or shortness of breath. 1 Inhaler 0  . amLODipine (NORVASC) 2.5 MG tablet Take 1 tablet (2.5 mg total) by mouth daily. 90 tablet 3  . aspirin EC 81 MG tablet Take 1 tablet (81 mg total) by mouth daily. 30 tablet 6  . benzonatate (TESSALON) 100 MG capsule Take 2 capsules (200 mg total) by mouth 2 (two) times daily as needed for up to 10 days. 45 capsule 0  . Calcium Carb-Cholecalciferol (CALCIUM 600+D) 600-800 MG-UNIT TABS Take 1 tablet by mouth 2 (two) times daily.    . carboxymethylcellulose (REFRESH PLUS) 0.5 % SOLN Place 1 drop into both eyes 3 (three) times daily as needed (dry eye).    . Cholecalciferol (VITAMIN D3) 5000 UNITS CAPS Take 1 capsule by mouth daily.    . furosemide (LASIX) 20 MG tablet Take 2 tablets (40 mg total) by mouth daily AND 2 tablets (40 mg total) every morning AND 2  tablets (40 mg total) every evening. Alternating doses. 225 tablet 3  . guaiFENesin (MUCINEX) 600 MG 12 hr tablet Take 600 mg by mouth 2 (two) times daily.    Marland Kitchen levothyroxine (SYNTHROID, LEVOTHROID) 75 MCG tablet Take 1 tablet (75 mcg total) by mouth daily before breakfast. 90 tablet 3  . loperamide (IMODIUM A-D) 2 MG tablet Take 2 mg by mouth 4 (four) times daily as needed for diarrhea or loose stools.    . metoprolol succinate (TOPROL-XL) 25 MG 24 hr tablet TAKE 1 TABLET BY MOUTH TWICE DAILY 180 tablet 0  . Multiple Vitamins-Minerals (PRESERVISION AREDS 2 PO) Take 2 tablets by mouth daily.     . pantoprazole (PROTONIX) 40 MG tablet Take 1 tablet (40 mg total) by mouth daily. 30 tablet 6  . Probiotic Product (PROBIOTIC ADVANCED PO) Take 500 mg by mouth daily.      No current  facility-administered medications on file prior to visit.     BP (!) 138/52 (BP Location: Left Arm, Patient Position: Sitting, Cuff Size: Normal)   Temp 98.5 F (36.9 C) (Oral)   Ht 5\' 2"  (1.575 m)   Wt 138 lb 3.2 oz (62.7 kg)   BMI 25.28 kg/m     Review of Systems  Constitutional: Positive for activity change, appetite change and fatigue.  HENT: Negative for congestion, dental problem, hearing loss, rhinorrhea, sinus pressure, sore throat and tinnitus.   Eyes: Negative for pain, discharge and visual disturbance.  Respiratory: Positive for cough. Negative for shortness of breath.   Cardiovascular: Negative for chest pain, palpitations and leg swelling.  Gastrointestinal: Negative for abdominal distention, abdominal pain, blood in stool, constipation, diarrhea, nausea and vomiting.  Genitourinary: Negative for difficulty urinating, dysuria, flank pain, frequency, hematuria, pelvic pain, urgency, vaginal bleeding, vaginal discharge and vaginal pain.  Musculoskeletal: Negative for arthralgias, gait problem and joint swelling.  Skin: Negative for rash.  Neurological: Positive for weakness. Negative for dizziness, syncope, speech difficulty, numbness and headaches.  Hematological: Negative for adenopathy.  Psychiatric/Behavioral: Negative for agitation, behavioral problems and dysphoric mood. The patient is not nervous/anxious.        Objective:   Physical Exam  Constitutional: She is oriented to person, place, and time. She appears well-developed and well-nourished. No distress.  Appears unwell but in no distress. Afebrile No tachycardia  HENT:  Head: Normocephalic.  Right Ear: External ear normal.  Left Ear: External ear normal.  Mouth/Throat: Oropharynx is clear and moist.  Eyes: Conjunctivae and EOM are normal. Pupils are equal, round, and reactive to light.  Neck: Normal range of motion. Neck supple. No thyromegaly present.  Cardiovascular: Normal rate, regular rhythm,  normal heart sounds and intact distal pulses.  Pulmonary/Chest: Effort normal and breath sounds normal. No respiratory distress. She has no wheezes. She has no rales.  Chest remains clear  Abdominal: Soft. Bowel sounds are normal. She exhibits no mass. There is no tenderness.  Musculoskeletal: Normal range of motion.  Lymphadenopathy:    She has no cervical adenopathy.  Neurological: She is alert and oriented to person, place, and time.  Skin: Skin is warm and dry. No rash noted.  Psychiatric: She has a normal mood and affect. Her behavior is normal.          Assessment & Plan:   Viral URI with persistent cough.  Patient reassured.  Will continue symptomatic treatment will continue with rest and encourage liberal fluid intake.  Patient report any new or worsening symptoms.  [name]

## 2017-11-11 ENCOUNTER — Other Ambulatory Visit (HOSPITAL_COMMUNITY): Payer: Self-pay | Admitting: Cardiology

## 2017-12-20 ENCOUNTER — Other Ambulatory Visit (HOSPITAL_COMMUNITY): Payer: Self-pay | Admitting: Internal Medicine

## 2018-01-27 ENCOUNTER — Encounter (HOSPITAL_COMMUNITY): Payer: Self-pay | Admitting: Cardiology

## 2018-01-27 ENCOUNTER — Ambulatory Visit (HOSPITAL_COMMUNITY)
Admission: RE | Admit: 2018-01-27 | Discharge: 2018-01-27 | Disposition: A | Discharge status: Home / Self Care | Payer: PPO | Source: Ambulatory Visit | Attending: Cardiology | Admitting: Cardiology

## 2018-01-27 VITALS — BP 140/64 | HR 73 | Wt 136.8 lb

## 2018-01-27 DIAGNOSIS — I429 Cardiomyopathy, unspecified: Secondary | ICD-10-CM | POA: Diagnosis not present

## 2018-01-27 DIAGNOSIS — Z7982 Long term (current) use of aspirin: Secondary | ICD-10-CM | POA: Insufficient documentation

## 2018-01-27 DIAGNOSIS — I130 Hypertensive heart and chronic kidney disease with heart failure and stage 1 through stage 4 chronic kidney disease, or unspecified chronic kidney disease: Secondary | ICD-10-CM | POA: Insufficient documentation

## 2018-01-27 DIAGNOSIS — Z79899 Other long term (current) drug therapy: Secondary | ICD-10-CM | POA: Diagnosis not present

## 2018-01-27 DIAGNOSIS — Z8249 Family history of ischemic heart disease and other diseases of the circulatory system: Secondary | ICD-10-CM | POA: Diagnosis not present

## 2018-01-27 DIAGNOSIS — Z853 Personal history of malignant neoplasm of breast: Secondary | ICD-10-CM | POA: Diagnosis not present

## 2018-01-27 DIAGNOSIS — K219 Gastro-esophageal reflux disease without esophagitis: Secondary | ICD-10-CM | POA: Insufficient documentation

## 2018-01-27 DIAGNOSIS — N184 Chronic kidney disease, stage 4 (severe): Secondary | ICD-10-CM | POA: Insufficient documentation

## 2018-01-27 DIAGNOSIS — E039 Hypothyroidism, unspecified: Secondary | ICD-10-CM | POA: Diagnosis not present

## 2018-01-27 DIAGNOSIS — I5022 Chronic systolic (congestive) heart failure: Secondary | ICD-10-CM | POA: Diagnosis not present

## 2018-01-27 LAB — BASIC METABOLIC PANEL
Anion gap: 10 (ref 5–15)
BUN: 63 mg/dL — AB (ref 6–20)
CO2: 24 mmol/L (ref 22–32)
Calcium: 8.8 mg/dL — ABNORMAL LOW (ref 8.9–10.3)
Chloride: 105 mmol/L (ref 101–111)
Creatinine, Ser: 2.93 mg/dL — ABNORMAL HIGH (ref 0.44–1.00)
GFR calc Af Amer: 15 mL/min — ABNORMAL LOW (ref >60)
GFR calc non Af Amer: 13 mL/min — ABNORMAL LOW (ref >60)
GLUCOSE: 96 mg/dL (ref 65–99)
POTASSIUM: 4.9 mmol/L (ref 3.5–5.1)
Sodium: 139 mmol/L (ref 135–145)

## 2018-01-27 NOTE — Patient Instructions (Signed)
Labs drawn today (if we do not call you, then your lab work was stable)   Your physician recommends that you schedule a follow-up appointment in: 6 months with Dr. Aundra Dubin  Please Call an Schedule Appointment ( September, 2019)

## 2018-01-28 DIAGNOSIS — H353114 Nonexudative age-related macular degeneration, right eye, advanced atrophic with subfoveal involvement: Secondary | ICD-10-CM | POA: Diagnosis not present

## 2018-01-28 DIAGNOSIS — H353123 Nonexudative age-related macular degeneration, left eye, advanced atrophic without subfoveal involvement: Secondary | ICD-10-CM | POA: Diagnosis not present

## 2018-01-28 NOTE — Progress Notes (Signed)
Patient ID: Mary Guzman, female   DOB: [birth year], 84 y.o.   MRN: [id number] PCP: Dr Burnice Logan Cardiology: Dr. Aundra Dubin  84 y.o. with history of breast cancer in the 1990s, HTN, and CKD presents for followup of CHF. She lives alone but daughter helps.  She dates her symptoms to around the time when she developed severe hip pain in 8/16. She was under a lot of stress with the severe pain and ended up getting steroid injections.  She developed exertional dyspnea around that time.  However, for > 1 year she has had significant lower extremity edema.  She got to the point where she was short of breath after walking about 15-20 feet.  No chest pain.  CXR in 9/16 showed mild pulmonary edema.  This improved with increase in Lasix to 40 qam and every other day 20 mg pm Lasix.  Repeat echo was done in 11/17.  This showed improvement in EF to 55%.  Echo in 11/18 showed that EF remains 55%.   She is stable symptomatically.  SBP in 130s when checked at home.  She does her ADLs, makes bed, takes bath, cooks food.  She walks around her house without dyspnea.  She has dyspnea and fatigue with moderate activity. No change in this pattern since last appointment.  No chest pain.  Daughter says she has "slowed down some."  Weight is down 5 lbs.    ECG (personally reviewed): NSR, IVCD, suspect old ASMI  Labs (9/16): pro-BNP > 5000, TSH normal Labs (10/16): K 4.5, creatinine 1.68 Labs (11/16): K 4.7, creatinine 1.67 Labs (12/16): K 4.9, creatinine 1.86 Labs (5/17): K 4.9, creatinine 2.1, BNP 373, TSH 0.98 (normal) Labs 01/25/2016: K 4.5 Creatinine 2.37 Labs (6/17): K 4.2, creatinine 2.16 Labs (11/17): K 4.6, creatinine 2.39, HCT 33.7 Labs (5/18): BNP 282, K 4.8, creatinine 3 Labs (11/18): K 4.4, creatinine 3.11 Labs (12/18): TSH normal  PMH: 1. Breast cancer: On left, s/p mastectomy in 1994 along with radiation treatment.  No chemotherapy.  2. Hypothyroidism. 3. OA 4. HTN 5. GERD 6. 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  7.  Chronic systolic CHF: Echo (30/16) with EF 20-25%, diffuse hypokinesis, mild MR, moderately decreased RV systolic function, PASP 60 mmHg.  - Echo (11/17): EF 55%, septal-lateral dyssynchrony, normal RV size and systolic function.  - Echo (11/18): EF 55%, moderate LVH, septal-lateral dyssynchrony, normal RV size and systolic function.   FH: Son with MI.  SH: Single, lives alone in [place], nonsmoker, no ETOH.   ROS: All systems reviewed and negative except as per HPI.   Current Outpatient Medications  Medication Sig Dispense Refill  . acetaminophen (TYLENOL) 500 MG tablet Take 500 mg by mouth every 6 (six) hours as needed for mild pain.     Marland Kitchen albuterol (PROVENTIL HFA;VENTOLIN HFA) 108 (90 Base) MCG/ACT inhaler Inhale 2 puffs into the lungs every 6 (six) hours as needed for wheezing or shortness of breath. 1 Inhaler 0  . amLODipine (NORVASC) 2.5 MG tablet Take 1 tablet (2.5 mg total) by mouth daily. 90 tablet 3  . aspirin EC 81 MG tablet Take 1 tablet (81 mg total) by mouth daily. 30 tablet 6  . Calcium Carb-Cholecalciferol (CALCIUM 600+D) 600-800 MG-UNIT TABS Take 1 tablet by mouth 2 (two) times daily.    . carboxymethylcellulose (REFRESH PLUS) 0.5 % SOLN Place 1 drop into both eyes 3 (three) times daily as needed (dry eye).    . Cholecalciferol (VITAMIN D3) 5000 UNITS CAPS Take 1 capsule by mouth  daily.    . furosemide (LASIX) 20 MG tablet Take 40mg  in the AM and 20mg  in the PM    . guaiFENesin (MUCINEX) 600 MG 12 hr tablet Take 600 mg by mouth 2 (two) times daily.    Marland Kitchen levothyroxine (SYNTHROID, LEVOTHROID) 75 MCG tablet Take 1 tablet (75 mcg total) by mouth daily before breakfast. 90 tablet 3  . loperamide (IMODIUM A-D) 2 MG tablet Take 2 mg by mouth 4 (four) times daily as needed for diarrhea or loose stools.    . metoprolol succinate (TOPROL-XL) 25 MG 24 hr tablet TAKE 1 TABLET BY MOUTH TWICE DAILY 180 tablet 0  . Multiple Vitamins-Minerals (PRESERVISION AREDS 2 PO) Take 2 tablets by  mouth daily.     . pantoprazole (PROTONIX) 40 MG tablet Take 1 tablet (40 mg total) by mouth daily. 30 tablet 6  . Probiotic Product (PROBIOTIC ADVANCED PO) Take 500 mg by mouth daily.      No current facility-administered medications for this encounter.    BP 140/64   Pulse 73   Wt 136 lb 12 oz (62 kg)   SpO2 97%   BMI 25.01 kg/m  General: NAD Neck: JVP 7-8 cm, no thyromegaly or thyroid nodule.  Lungs: Clear to auscultation bilaterally with normal respiratory effort. CV: Nondisplaced PMI.  Heart regular S1/S2, no S3/S4, 1/6 HSM LLSB.  Trace ankle edema.  No carotid bruit.  Normal pedal pulses.  Abdomen: Soft, nontender, no hepatosplenomegaly, no distention.  Skin: Intact without lesions or rashes.  Neurologic: Alert and oriented x 3.  Psych: Normal affect. Extremities: No clubbing or cyanosis.  HEENT: Normal.   Assessment/Plan: 1. Chronic systolic CHF: EF 91-91% with moderate RV dysfunction on echo in 10/16.  Etiology of the cardiomyopathy is uncertain: no chest pain or suggestion of CAD, however cannot rule out ischemic cardiomyopathy.  Symptoms noted at a time of significant stress with severe hip pain in 8/16, so cannot rule out Takotsubo CMP.  Viral myocarditis remains possible.  Echo in 11/17 showed recovery of EF to 55%.  11/18 echo showed that EF remained 55%. Therefore, it is possible that the original insult was a stress (Takotsubo-type) cardiomyopathy.  NYHA class II-III symptoms, volume status looks ok.  - Continue Lasix 40 qam/20 qpm.  BMET today.  - Continue Toprol XL 25 mg bid.  - Stay off ACEI/ARB/ARNI/spironolactone with CKD IV.  2. CKD: Stage IV.   BMET today. 3. HTN: BP high today, generally better at home.  Continue amlodipine 2.5 mg daily.   Followup in 6 months   Loralie Champagne 01/28/2018

## 2018-02-10 ENCOUNTER — Encounter: Payer: Self-pay | Admitting: Internal Medicine

## 2018-02-10 ENCOUNTER — Ambulatory Visit (INDEPENDENT_AMBULATORY_CARE_PROVIDER_SITE_OTHER): Payer: PPO | Admitting: Internal Medicine

## 2018-02-10 VITALS — BP 150/62 | HR 58 | Temp 97.6°F | Wt 136.0 lb

## 2018-02-10 DIAGNOSIS — I10 Essential (primary) hypertension: Secondary | ICD-10-CM

## 2018-02-10 DIAGNOSIS — N183 Chronic kidney disease, stage 3 unspecified: Secondary | ICD-10-CM

## 2018-02-10 DIAGNOSIS — I5022 Chronic systolic (congestive) heart failure: Secondary | ICD-10-CM | POA: Diagnosis not present

## 2018-02-10 NOTE — Patient Instructions (Signed)
Return in October for your annual exam and annual flu vaccination  Limit your sodium (Salt) intake

## 2018-02-10 NOTE — Progress Notes (Signed)
Subjective:    Patient ID: Mary Guzman, female    DOB: [birth year], 84 y.o.   MRN: [id number]  HPI  Wt Readings from Last 3 Encounters:  02/10/18 136 lb (61.7 kg)  01/27/18 136 lb 12 oz (62 kg)  09/30/17 138 lb 3.2 oz (33.74 kg)   84 year old patient who is seen today for follow-up.  She is followed by cardiology with stable diastolic heart failure.  She has essential hypertension history of hypothyroidism and chronic kidney disease.  She did have lab performed last month. She continues to do quite well without concerns or complaints.  Accompanied by her daughter today Denies any shortness of breath  Past Medical History:  Diagnosis Date  . 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  . DJD (degenerative joint disease)   . GERD (gastroesophageal reflux disease)   . Hypertension   . Osteoporosis   . Thyroid disease    Hypothyroidism     Social History   Socioeconomic History  . Marital status: Widowed    Spouse name: Not on file  . Number of children: Not on file  . Years of education: Not on file  . Highest education level: Not on file  Occupational History  . Not on file  Social Needs  . Financial resource strain: Not on file  . Food insecurity:    Worry: Not on file    Inability: Not on file  . Transportation needs:    Medical: Not on file    Non-medical: Not on file  Tobacco Use  . Smoking status: Never Smoker  . Smokeless tobacco: Current User    Types: Chew  Substance and Sexual Activity  . Alcohol use: No  . Drug use: Not on file  . Sexual activity: Not on file  Lifestyle  . Physical activity:    Days per week: Not on file    Minutes per session: Not on file  . Stress: Not on file  Relationships  . Social connections:    Talks on phone: Not on file    Gets together: Not on file    Attends religious service: Not on file    Active member of club or organization: Not on file    Attends  meetings of clubs or organizations: Not on file    Relationship status: Not on file  . Intimate partner violence:    Fear of current or ex partner: Not on file    Emotionally abused: Not on file    Physically abused: Not on file    Forced sexual activity: Not on file  Other Topics Concern  . Not on file  Social History Narrative   Fairly active interaction with grandchildren.    Past Surgical History:  Procedure Laterality Date  . ANKLE SURGERY     Right  . INTRACAPSULAR CATARACT EXTRACTION    . MASTECTOMY  1994    Family History  Problem Relation Age of Onset  . Alcohol abuse Brother   . Asthma Son     No Known Allergies  Current Outpatient Medications on File Prior to Visit  Medication Sig Dispense Refill  . acetaminophen (TYLENOL) 500 MG tablet Take 500 mg by mouth every 6 (six) hours as needed for mild pain.     Marland Kitchen amLODipine (NORVASC) 2.5 MG tablet Take 1 tablet (2.5 mg total) by mouth daily. 90 tablet 3  . aspirin EC 81 MG tablet Take  1 tablet (81 mg total) by mouth daily. 30 tablet 6  . Calcium Carb-Cholecalciferol (CALCIUM 600+D) 600-800 MG-UNIT TABS Take 1 tablet by mouth 2 (two) times daily.    . carboxymethylcellulose (REFRESH PLUS) 0.5 % SOLN Place 1 drop into both eyes 3 (three) times daily as needed (dry eye).    . Cholecalciferol (VITAMIN D3) 5000 UNITS CAPS Take 1 capsule by mouth daily.    . furosemide (LASIX) 20 MG tablet Take 40mg  in the AM and 20mg  in the PM    . levothyroxine (SYNTHROID, LEVOTHROID) 75 MCG tablet Take 1 tablet (75 mcg total) by mouth daily before breakfast. 90 tablet 3  . loperamide (IMODIUM A-D) 2 MG tablet Take 2 mg by mouth 4 (four) times daily as needed for diarrhea or loose stools.    . metoprolol succinate (TOPROL-XL) 25 MG 24 hr tablet TAKE 1 TABLET BY MOUTH TWICE DAILY 180 tablet 0  . Multiple Vitamins-Minerals (PRESERVISION AREDS 2 PO) Take 2 tablets by mouth daily.     . pantoprazole (PROTONIX) 40 MG tablet Take 1 tablet (40 mg  total) by mouth daily. 30 tablet 6  . Probiotic Product (PROBIOTIC ADVANCED PO) Take 500 mg by mouth daily.     Marland Kitchen albuterol (PROVENTIL HFA;VENTOLIN HFA) 108 (90 Base) MCG/ACT inhaler Inhale 2 puffs into the lungs every 6 (six) hours as needed for wheezing or shortness of breath. (Patient not taking: Reported on 02/10/2018) 1 Inhaler 0  . guaiFENesin (MUCINEX) 600 MG 12 hr tablet Take 600 mg by mouth 2 (two) times daily.     No current facility-administered medications on file prior to visit.     BP (!) 150/62 (BP Location: Right Arm, Patient Position: Sitting, Cuff Size: Normal)   Pulse (!) 58   Temp 97.6 F (36.4 C) (Oral)   Wt 136 lb (61.7 kg)   SpO2 99%   BMI 24.87 kg/m    Review of Systems  Constitutional: Negative.   HENT: Negative for congestion, dental problem, hearing loss, rhinorrhea, sinus pressure, sore throat and tinnitus.   Eyes: Negative for pain, discharge and visual disturbance.  Respiratory: Negative for cough and shortness of breath.   Cardiovascular: Negative for chest pain, palpitations and leg swelling.  Gastrointestinal: Negative for abdominal distention, abdominal pain, blood in stool, constipation, diarrhea, nausea and vomiting.  Genitourinary: Negative for difficulty urinating, dysuria, flank pain, frequency, hematuria, pelvic pain, urgency, vaginal bleeding, vaginal discharge and vaginal pain.  Musculoskeletal: Positive for arthralgias and gait problem. Negative for joint swelling.  Skin: Negative for rash.  Neurological: Negative for dizziness, syncope, speech difficulty, weakness, numbness and headaches.  Hematological: Negative for adenopathy.  Psychiatric/Behavioral: Negative for agitation, behavioral problems and dysphoric mood. The patient is not nervous/anxious.        Objective:   Physical Exam  Constitutional: She is oriented to person, place, and time. She appears well-developed and well-nourished.  HENT:  Head: Normocephalic.  Right Ear:  External ear normal.  Left Ear: External ear normal.  Mouth/Throat: Oropharynx is clear and moist.  Eyes: Pupils are equal, round, and reactive to light. Conjunctivae and EOM are normal.  Neck: Normal range of motion. Neck supple. No thyromegaly present.  Cardiovascular: Normal rate, regular rhythm, normal heart sounds and intact distal pulses.  Pulmonary/Chest: Effort normal and breath sounds normal.  Mild kyphosis  Abdominal: Soft. Bowel sounds are normal. She exhibits no mass. There is no tenderness.  Musculoskeletal: Normal range of motion.  Trace ankle edema  Lymphadenopathy:  She has no cervical adenopathy.  Neurological: She is alert and oriented to person, place, and time.  Skin: Skin is warm and dry. No rash noted.  Psychiatric: She has a normal mood and affect. Her behavior is normal.          Assessment & Plan:   Chronic diastolic heart failure.  Compensated Essential hypertension stable Chronic kidney disease  No change in medical regimen Medicines updated  Return in 5 months for annual exam with updated lab  [name]

## 2018-03-31 ENCOUNTER — Other Ambulatory Visit (HOSPITAL_COMMUNITY): Payer: Self-pay

## 2018-03-31 MED ORDER — METOPROLOL SUCCINATE ER 25 MG PO TB24: 25.0000 mg | ORAL_TABLET | Freq: Two times a day (BID) | ORAL | 0 refills | Status: DC

## 2018-04-03 ENCOUNTER — Telehealth: Payer: Self-pay | Admitting: Internal Medicine

## 2018-04-03 NOTE — Telephone Encounter (Signed)
Mary Guzman dropped off disability placard   Mail to the patient with the attached envelope  Call Mary Guzman [phone] after the form has been mailed, so she can be on the look out for the form.  Disposition: Dr's folder

## 2018-04-04 ENCOUNTER — Other Ambulatory Visit (HOSPITAL_COMMUNITY): Payer: Self-pay | Admitting: Internal Medicine

## 2018-04-09 MED ORDER — FUROSEMIDE 20 MG PO TABS: ORAL_TABLET | ORAL | 0 refills | Status: DC

## 2018-04-09 NOTE — Telephone Encounter (Signed)
Pt papers may have been put into the scan basket. We are waiting to see if they will come back to my folder to call pt. "per Ria Comment"

## 2018-04-15 NOTE — Telephone Encounter (Signed)
Patient papers are either faxed, picked up or never received per Dr.Kwiatkowski. Will call Kailee Essman to see if received!

## 2018-04-15 NOTE — Telephone Encounter (Signed)
Spoke to pharmacy and placard was received in the mail and taking to the [facility]. No further action needed!

## 2018-05-19 ENCOUNTER — Other Ambulatory Visit: Payer: Self-pay

## 2018-05-19 ENCOUNTER — Emergency Department (HOSPITAL_COMMUNITY)
Admission: EM | Admit: 2018-05-19 | Discharge: 2018-05-20 | Disposition: A | Discharge status: Home / Self Care | Payer: PPO | Attending: Emergency Medicine | Admitting: Emergency Medicine

## 2018-05-19 ENCOUNTER — Encounter: Payer: Self-pay | Admitting: Adult Health

## 2018-05-19 ENCOUNTER — Encounter (HOSPITAL_COMMUNITY): Payer: Self-pay

## 2018-05-19 ENCOUNTER — Ambulatory Visit (INDEPENDENT_AMBULATORY_CARE_PROVIDER_SITE_OTHER): Payer: PPO | Admitting: Adult Health

## 2018-05-19 ENCOUNTER — Telehealth: Payer: Self-pay | Admitting: Family Medicine

## 2018-05-19 VITALS — BP 158/66 | Temp 97.7°F | Wt 136.0 lb

## 2018-05-19 DIAGNOSIS — E875 Hyperkalemia: Secondary | ICD-10-CM | POA: Diagnosis not present

## 2018-05-19 DIAGNOSIS — I5022 Chronic systolic (congestive) heart failure: Secondary | ICD-10-CM | POA: Insufficient documentation

## 2018-05-19 DIAGNOSIS — Z23 Encounter for immunization: Secondary | ICD-10-CM

## 2018-05-19 DIAGNOSIS — D631 Anemia in chronic kidney disease: Secondary | ICD-10-CM | POA: Diagnosis not present

## 2018-05-19 DIAGNOSIS — Z7689 Persons encountering health services in other specified circumstances: Secondary | ICD-10-CM | POA: Diagnosis not present

## 2018-05-19 DIAGNOSIS — I10 Essential (primary) hypertension: Secondary | ICD-10-CM | POA: Diagnosis not present

## 2018-05-19 DIAGNOSIS — Z7982 Long term (current) use of aspirin: Secondary | ICD-10-CM | POA: Diagnosis not present

## 2018-05-19 DIAGNOSIS — I130 Hypertensive heart and chronic kidney disease with heart failure and stage 1 through stage 4 chronic kidney disease, or unspecified chronic kidney disease: Secondary | ICD-10-CM | POA: Insufficient documentation

## 2018-05-19 DIAGNOSIS — N184 Chronic kidney disease, stage 4 (severe): Secondary | ICD-10-CM | POA: Diagnosis not present

## 2018-05-19 DIAGNOSIS — E039 Hypothyroidism, unspecified: Secondary | ICD-10-CM | POA: Diagnosis not present

## 2018-05-19 DIAGNOSIS — Z79899 Other long term (current) drug therapy: Secondary | ICD-10-CM | POA: Insufficient documentation

## 2018-05-19 DIAGNOSIS — R899 Unspecified abnormal finding in specimens from other organs, systems and tissues: Secondary | ICD-10-CM

## 2018-05-19 DIAGNOSIS — R531 Weakness: Secondary | ICD-10-CM | POA: Diagnosis not present

## 2018-05-19 DIAGNOSIS — E038 Other specified hypothyroidism: Secondary | ICD-10-CM

## 2018-05-19 DIAGNOSIS — D649 Anemia, unspecified: Secondary | ICD-10-CM

## 2018-05-19 DIAGNOSIS — N183 Chronic kidney disease, stage 3 (moderate): Secondary | ICD-10-CM | POA: Diagnosis not present

## 2018-05-19 DIAGNOSIS — R799 Abnormal finding of blood chemistry, unspecified: Secondary | ICD-10-CM | POA: Diagnosis not present

## 2018-05-19 LAB — CBC WITH DIFFERENTIAL/PLATELET
BASOS ABS: 0.1 10*3/uL (ref 0.0–0.1)
Basophils Relative: 1 % (ref 0.0–3.0)
EOS PCT: 3.8 % (ref 0.0–5.0)
Eosinophils Absolute: 0.2 10*3/uL (ref 0.0–0.7)
HCT: 23.9 % — CL (ref 36.0–46.0)
Lymphocytes Relative: 24.1 % (ref 12.0–46.0)
Lymphs Abs: 1.6 10*3/uL (ref 0.7–4.0)
MCHC: 32.4 g/dL (ref 30.0–36.0)
MCV: 75.7 fl — AB (ref 78.0–100.0)
MONO ABS: 0.7 10*3/uL (ref 0.1–1.0)
MONOS PCT: 10.1 % (ref 3.0–12.0)
Neutro Abs: 3.9 10*3/uL (ref 1.4–7.7)
Neutrophils Relative %: 61 % (ref 43.0–77.0)
Platelets: 360 10*3/uL (ref 150.0–400.0)
RBC: 3.15 Mil/uL — AB (ref 3.87–5.11)
RDW: 17.8 % — ABNORMAL HIGH (ref 11.5–15.5)
WBC: 6.5 10*3/uL (ref 4.0–10.5)

## 2018-05-19 LAB — CBC
HCT: 27.3 % — ABNORMAL LOW (ref 36.0–46.0)
Hemoglobin: 7.9 g/dL — ABNORMAL LOW (ref 12.0–15.0)
MCH: 23.9 pg — ABNORMAL LOW (ref 26.0–34.0)
MCHC: 28.9 g/dL — ABNORMAL LOW (ref 30.0–36.0)
MCV: 82.7 fL (ref 78.0–100.0)
PLATELETS: 377 10*3/uL (ref 150–400)
RBC: 3.3 MIL/uL — AB (ref 3.87–5.11)
RDW: 17.2 % — ABNORMAL HIGH (ref 11.5–15.5)
WBC: 6.7 10*3/uL (ref 4.0–10.5)

## 2018-05-19 LAB — HEPATIC FUNCTION PANEL
ALT: 20 U/L (ref 0–44)
AST: 40 U/L (ref 15–41)
Albumin: 3.6 g/dL (ref 3.5–5.0)
Alkaline Phosphatase: 218 U/L — ABNORMAL HIGH (ref 38–126)
Bilirubin, Direct: <0.1 mg/dL (ref 0.0–0.2)
TOTAL PROTEIN: 6.8 g/dL (ref 6.5–8.1)
Total Bilirubin: 0.7 mg/dL (ref 0.3–1.2)

## 2018-05-19 LAB — BASIC METABOLIC PANEL
Anion gap: 9 (ref 5–15)
BUN: 52 mg/dL — ABNORMAL HIGH (ref 6–23)
BUN: 50 mg/dL — ABNORMAL HIGH (ref 8–23)
CALCIUM: 8.7 mg/dL (ref 8.4–10.5)
CHLORIDE: 111 mmol/L (ref 98–111)
CO2: 22 mEq/L (ref 19–32)
CO2: 23 mmol/L (ref 22–32)
CREATININE: 2.51 mg/dL — AB (ref 0.44–1.00)
Calcium: 8.8 mg/dL — ABNORMAL LOW (ref 8.9–10.3)
Chloride: 110 mEq/L (ref 96–112)
Creatinine, Ser: 2.53 mg/dL — ABNORMAL HIGH (ref 0.40–1.20)
GFR calc Af Amer: 18 mL/min — ABNORMAL LOW (ref >60)
GFR calc non Af Amer: 15 mL/min — ABNORMAL LOW (ref >60)
GFR: 18.75 mL/min — AB (ref >60.00)
Glucose, Bld: 89 mg/dL (ref 70–99)
Glucose, Bld: 109 mg/dL — ABNORMAL HIGH (ref 70–99)
Potassium: 5.5 mmol/L — ABNORMAL HIGH (ref 3.5–5.1)
SODIUM: 142 meq/L (ref 135–145)
SODIUM: 143 mmol/L (ref 135–145)

## 2018-05-19 LAB — ABO/RH: ABO/RH(D): O POS

## 2018-05-19 LAB — TSH: TSH: 2.6 u[IU]/mL (ref 0.35–4.50)

## 2018-05-19 LAB — URINALYSIS, ROUTINE W REFLEX MICROSCOPIC
Bilirubin Urine: NEGATIVE
GLUCOSE, UA: NEGATIVE mg/dL
Hgb urine dipstick: NEGATIVE
Ketones, ur: NEGATIVE mg/dL
Leukocytes, UA: NEGATIVE
Nitrite: NEGATIVE
PROTEIN: NEGATIVE mg/dL
Specific Gravity, Urine: 1.009 (ref 1.005–1.030)
pH: 7 (ref 5.0–8.0)

## 2018-05-19 LAB — TYPE AND SCREEN
ABO/RH(D): O POS
Antibody Screen: NEGATIVE

## 2018-05-19 NOTE — Progress Notes (Signed)
Patient presents to clinic today to establish care. He is a pleasant 84 year old female who  has a past medical history of Cancer ([place]), DJD (degenerative joint disease), GERD (gastroesophageal reflux disease), Hypertension, Osteoporosis, and Thyroid disease. Her daughter is with her at this visit.   She continues to live at home but her daughter stays with her during the day  She is a patient of Dr. Raliegh Ip who I am seeing today for a transfer of care appointment    Acute Concerns: Establish Care   Chronic Issues:  Diastolic Heart Failure - Stable. Is followed by Cardiology. Echo (11/18): EF 55%, moderate LVH, septal-lateral dyssynchrony, normal RV size and systolic function. She is able to do her ADL's without difficulty. She denies any shortness of breath with ADL's but does have shortness of breath with moderate activity. He weight log does not show weight again of 5 pounds. She has stayed steady at 130-132 lbs.   Essential Hypertension- Takes Norvasc, Metoprolol, and Lasix Her BP is better controlled at home. Per her log, BP between 130-150 /70's at home BP Readings from Last 3 Encounters:  05/19/18 (!) 158/66  02/10/18 (!) 150/62  01/27/18 140/64   Hypothyroidism-  Takes Synthroid 75 mg daily  Lab Results  Component Value Date   TSH 2.599 09/02/2017   CKD- Stage IV Lab Results  Component Value Date   CREATININE 2.93 (H) 01/27/2018   BUN 63 (H) 01/27/2018   NA 139 01/27/2018   K 4.9 01/27/2018   CL 105 01/27/2018   CO2 24 01/27/2018   Health Maintenance: Dental -- Has dentures.  Vision -- Retina - Dr. Baird Cancer. - Sees twice a year.  Immunizations -- Needs seasonal flu  Colonoscopy -- No longer needs Mammogram -- No longer needs PAP -- No longer needs   Past Medical History:  Diagnosis Date  . Cancer ([place])    Breast Hx of stage IIB, moderately differentiated with 3 of 8 possible lymph nodes.  . DJD (degenerative joint disease)   . GERD (gastroesophageal reflux  disease)   . Hypertension   . Osteoporosis   . Thyroid disease    Hypothyroidism    Past Surgical History:  Procedure Laterality Date  . ANKLE SURGERY     Right  . INTRACAPSULAR CATARACT EXTRACTION    . MASTECTOMY  1994    Current Outpatient Medications on File Prior to Visit  Medication Sig Dispense Refill  . acetaminophen (TYLENOL) 500 MG tablet Take 500 mg by mouth every 6 (six) hours as needed for mild pain.     Marland Kitchen amLODipine (NORVASC) 2.5 MG tablet TAKE 1 TABLET(2.5 MG) BY MOUTH DAILY 90 tablet 0  . aspirin EC 81 MG tablet Take 1 tablet (81 mg total) by mouth daily. 30 tablet 6  . Calcium Carb-Cholecalciferol (CALCIUM 600+D) 600-800 MG-UNIT TABS Take 1 tablet by mouth 2 (two) times daily.    . carboxymethylcellulose (REFRESH PLUS) 0.5 % SOLN Place 1 drop into both eyes 3 (three) times daily as needed (dry eye).    . Cholecalciferol (VITAMIN D3) 5000 UNITS CAPS Take 1 capsule by mouth daily.    . furosemide (LASIX) 20 MG tablet Take 2 tablets (40 mg total) by mouth every morning AND 1 tablet (20 mg total) every evening. 270 tablet 0  . levothyroxine (SYNTHROID, LEVOTHROID) 75 MCG tablet Take 1 tablet (75 mcg total) by mouth daily before breakfast. 90 tablet 3  . loperamide (IMODIUM A-D) 2 MG tablet Take 2 mg  by mouth 4 (four) times daily as needed for diarrhea or loose stools.    . metoprolol succinate (TOPROL-XL) 25 MG 24 hr tablet Take 1 tablet (25 mg total) by mouth 2 (two) times daily. 180 tablet 0  . Multiple Vitamins-Minerals (PRESERVISION AREDS 2 PO) Take 2 tablets by mouth daily.     . pantoprazole (PROTONIX) 40 MG tablet Take 1 tablet (40 mg total) by mouth daily. 30 tablet 6  . Probiotic Product (PROBIOTIC ADVANCED PO) Take 500 mg by mouth daily.      No current facility-administered medications on file prior to visit.     No Known Allergies  Family History  Problem Relation Age of Onset  . Alcohol abuse Brother   . Asthma Son     Social History    Socioeconomic History  . Marital status: Widowed    Spouse name: Not on file  . Number of children: Not on file  . Years of education: Not on file  . Highest education level: Not on file  Occupational History  . Not on file  Social Needs  . Financial resource strain: Not on file  . Food insecurity:    Worry: Not on file    Inability: Not on file  . Transportation needs:    Medical: Not on file    Non-medical: Not on file  Tobacco Use  . Smoking status: Never Smoker  . Smokeless tobacco: Current User    Types: Chew  Substance and Sexual Activity  . Alcohol use: No  . Drug use: Not on file  . Sexual activity: Not on file  Lifestyle  . Physical activity:    Days per week: Not on file    Minutes per session: Not on file  . Stress: Not on file  Relationships  . Social connections:    Talks on phone: Not on file    Gets together: Not on file    Attends religious service: Not on file    Active member of club or organization: Not on file    Attends meetings of clubs or organizations: Not on file    Relationship status: Not on file  . Intimate partner violence:    Fear of current or ex partner: Not on file    Emotionally abused: Not on file    Physically abused: Not on file    Forced sexual activity: Not on file  Other Topics Concern  . Not on file  Social History Narrative   Fairly active interaction with grandchildren.    Review of Systems  Constitutional: Negative.   HENT: Negative.   Eyes: Negative.   Respiratory: Positive for shortness of breath.   Gastrointestinal: Negative.   Genitourinary: Negative.   Musculoskeletal: Negative.   Skin: Negative.   Neurological: Negative.   Endo/Heme/Allergies: Negative.   Psychiatric/Behavioral: Negative.   All other systems reviewed and are negative.   BP (!) 158/66   Temp 97.7 F (36.5 C) (Oral)   Wt 136 lb (61.7 kg)   BMI 24.87 kg/m   Physical Exam  Constitutional: She is oriented to person, place, and time.  She appears well-developed and well-nourished. No distress.  HENT:  Head: Normocephalic and atraumatic.  Right Ear: Hearing, tympanic membrane, external ear and ear canal normal.  Left Ear: Hearing, tympanic membrane, external ear and ear canal normal.  Nose: Nose normal.  Mouth/Throat: Oropharynx is clear and moist. She has dentures. No oropharyngeal exudate.  Eyes: Pupils are equal, round, and reactive to light. Conjunctivae  and EOM are normal. Right eye exhibits no discharge. Left eye exhibits no discharge. No scleral icterus.  Neck: Normal range of motion. Neck supple. No JVD present. No tracheal deviation present. No thyromegaly present.  Cardiovascular: Normal rate, regular rhythm, normal heart sounds and intact distal pulses. Exam reveals no gallop and no friction rub.  No murmur heard. Pulmonary/Chest: Effort normal and breath sounds normal. No stridor. No respiratory distress. She has no wheezes. She has no rales. She exhibits no tenderness.  Abdominal: Soft. Bowel sounds are normal. She exhibits no distension and no mass. There is no tenderness. There is no rebound and no guarding. No hernia.  Musculoskeletal: Normal range of motion. She exhibits edema (trace ankle edema ). She exhibits no tenderness or deformity.  Lymphadenopathy:    She has no cervical adenopathy.  Neurological: She is alert and oriented to person, place, and time. She displays normal reflexes. No cranial nerve deficit or sensory deficit. She exhibits normal muscle tone. Coordination normal.  Skin: Skin is warm and dry. Capillary refill takes less than 2 seconds. She is not diaphoretic.  Psychiatric: She has a normal mood and affect. Her behavior is normal. Judgment and thought content normal.  Nursing note and vitals reviewed.  Assessment/Plan: 1. Encounter to establish care - We will keep her on follow up visits every 6 months.  - high dose flu given today   2. Essential hypertension - no change in  medications  - Basic metabolic panel - CBC with Differential/Platelet - TSH  3. Anemia in stage 4 chronic kidney disease (HCC)  - Basic metabolic panel - CBC with Differential/Platelet - TSH  4. Other specified hypothyroidism - consider titration of synthroid  - Basic metabolic panel - CBC with Differential/Platelet - TSH   [name], NP

## 2018-05-19 NOTE — Patient Instructions (Signed)
It was great seeing you again.   I will follow up with you regarding your blood work   Follow up with me in Feb/March for your follow up visit.   If you need anything in the meantime, please let me know

## 2018-05-19 NOTE — Telephone Encounter (Signed)
Mary Guzman notified verbally

## 2018-05-19 NOTE — Addendum Note (Signed)
Addended by: Miles Costain T on: 05/19/2018 01:24 PM   Modules accepted: Orders

## 2018-05-19 NOTE — ED Provider Notes (Signed)
Patient placed in Quick Look pathway, seen and evaluated   Chief Complaint: Abnormal lab  HPI:   Patient is a 84 year old female with a history of CKD and anemia who presents the emergency department today for evaluation after she was told to come to the ED by her PCP for abnormal labs.  States that her hemoglobin was noted to be 7.7 and her potassium was also high at 6.3.  Patient reports generalized weakness, but otherwise denies chest pain, shortness of breath or any other symptoms.  ROS: Abnormal lab, generalized weakness (one)  Physical Exam:   Gen: No distress  Neuro: Awake and Alert  Skin: Warm    Focused Exam: RRR. CTAB. Pale conjunctivae bilaterally.    Initiation of care has begun. The patient has been counseled on the process, plan, and necessity for staying for the completion/evaluation, and the remainder of the medical screening examination  Pt advised to inform nursing staff immediately if they experience any new or worsening of symptoms while waiting in the waiting room.    Bishop Dublin 05/19/18 1928    Carmin Muskrat, MD 05/20/18 Shelah Lewandowsky

## 2018-05-19 NOTE — Telephone Encounter (Signed)
Call report from [facility] lab- critical lab potassium 6.3, will forward to [facility] NP.

## 2018-05-19 NOTE — ED Triage Notes (Signed)
Pt here from PCP office for abnormal labs.  Stated hemoglobin was low and potassium was high.  A&Ox4.  Patient stated she feels weaker than normal.

## 2018-05-19 NOTE — Telephone Encounter (Signed)
Call report-critical labs Hemoglobin 7.7 & Hematocrit 23.9, will forward results to [facility] NP.

## 2018-05-20 ENCOUNTER — Telehealth: Payer: Self-pay | Admitting: Family Medicine

## 2018-05-20 DIAGNOSIS — D649 Anemia, unspecified: Secondary | ICD-10-CM

## 2018-05-20 LAB — RETICULOCYTES
RBC.: 3.1 MIL/uL — AB (ref 3.87–5.11)
Retic Count, Absolute: 34.1 10*3/uL (ref 19.0–186.0)
Retic Ct Pct: 1.1 % (ref 0.4–3.1)

## 2018-05-20 MED ORDER — FERROUS SULFATE 325 (65 FE) MG PO TABS: 325.0000 mg | ORAL_TABLET | Freq: Every day | ORAL | 1 refills | Status: DC

## 2018-05-20 NOTE — Telephone Encounter (Signed)
Copied from [facility] [id number]. Topic: Referral - Request >> May 20, 2018  3:16 PM Yvette Rack wrote: Reason for CRM: Pt Mary Guzman states PA Antonietta Breach in the Emergency Dept suggested that pt get a referral to Dr. Betsy Coder at [facility] ph# [phone].

## 2018-05-20 NOTE — Telephone Encounter (Signed)
Copied from [place] [id number]. Topic: Quick Communication - See Telephone Encounter >> May 20, 2018  1:19 PM Margot Ables wrote: CRM for notification. See Telephone encounter for: 05/20/18.  [name] states they went to ER from 6:30pm - 3:30am. All labs were done again. EKG was done. Labs leveled out. Suggested pt to take iron daily (41fe ferrous sulfate). ER follow up scheduled 05/28/18. Pt was advised to f/u with Hematology/Oncology. Ok to call back at pt home # or [name]'s cell #.

## 2018-05-20 NOTE — ED Notes (Signed)
Patient Alert and oriented to baseline. Stable and ambulatory to baseline. Patient verbalized understanding of the discharge instructions.  Patient belongings were taken by the patient.   

## 2018-05-20 NOTE — Discharge Instructions (Signed)
We recommend that you take a daily iron tablet.  This may cause constipation, in which case you would benefit from use of a fiber supplement such as Benefiber or MiraLAX.    Have your hemoglobin rechecked by your primary care doctor in 1 week.  You have also been referred to hematology.  Call to schedule a follow-up appointment for further evaluation of your anemia.    Should you develop increased bruising, bloody bowel movements, black stool, blood in your urine, or fever we advised that you seek evaluation in the emergency department.

## 2018-05-20 NOTE — ED Provider Notes (Signed)
[facility] EMERGENCY DEPARTMENT Provider Note   CSN: [id number] Arrival date & time: 05/19/18  1842     History   Chief Complaint Chief Complaint  Patient presents with  . Abnormal Lab    HPI Mary Guzman is a 84 y.o. female.   84 year old female with a history of chronic kidney disease and anemia presents to the emergency department at the advice of her primary care doctor for evaluation of anemia and hyperkalemia.  She was seen yesterday in routine evaluation, attempting to establish care with a new primary doctor.  Was told to come into the emergency department after her potassium was 6.3 with an anemia of 7.7.  Has a degree of fatigue chronically.  She states that she is feeling fine at present.  She has chronic shortness of breath with prolonged exertion.  Denies any shortness of breath at present.  She has not noticed any increased bruising or bleeding.  No hematuria, melena, hematochezia.  No recent fevers.     Past Medical History:  Diagnosis Date  . Cancer ([place])    Breast Hx of stage IIB, moderately differentiated with 3 of 8 possible lymph nodes.  . DJD (degenerative joint disease)   . GERD (gastroesophageal reflux disease)   . Hypertension   . Osteoporosis   . Thyroid disease    Hypothyroidism    Patient Active Problem List   Diagnosis Date Noted  . Anemia in chronic kidney disease 07/02/2016  . CKD (chronic kidney disease), stage III ([place]) 07/29/2015  . Chronic systolic CHF (congestive heart failure) ([place]) 07/16/2015  . Acute on chronic systolic heart failure ([place]) 06/30/2015  . Bilateral leg edema 07/27/2014  . Chronic kidney disease 06/07/2013  . Essential hypertension 07/03/2009  . Hypothyroidism 03/16/2007  . GERD 03/16/2007  . Osteoarthritis 03/16/2007  . Osteoporosis 03/16/2007  . BREAST CANCER, HX OF 03/16/2007    Past Surgical History:  Procedure Laterality Date  . ANKLE SURGERY     Right  . INTRACAPSULAR CATARACT  EXTRACTION    . MASTECTOMY  1994     OB History    Gravida  2   Para  2   Term      Preterm      AB  0   Living        SAB      TAB      Ectopic      Multiple      Live Births               Home Medications    Prior to Admission medications   Medication Sig Start Date End Date Taking? Authorizing Provider  acetaminophen (TYLENOL) 500 MG tablet Take 500 mg by mouth every 6 (six) hours as needed for mild pain.     [provider]  amLODipine (NORVASC) 2.5 MG tablet TAKE 1 TABLET(2.5 MG) BY MOUTH DAILY 04/06/18   Bensimhon, Shaune Pascal, MD  aspirin EC 81 MG tablet Take 1 tablet (81 mg total) by mouth daily. 07/29/16   Larey Dresser, MD  Calcium Carb-Cholecalciferol (CALCIUM 600+D) 600-800 MG-UNIT TABS Take 1 tablet by mouth 2 (two) times daily.    [provider]  carboxymethylcellulose (REFRESH PLUS) 0.5 % SOLN Place 1 drop into both eyes 3 (three) times daily as needed (dry eye).    [provider]  Cholecalciferol (VITAMIN D3) 5000 UNITS CAPS Take 1 capsule by mouth daily.    [provider]  ferrous sulfate  325 (65 FE) MG tablet Take 1 tablet (325 mg total) by mouth daily. 05/20/18   Antonietta Breach, PA-C  furosemide (LASIX) 20 MG tablet Take 2 tablets (40 mg total) by mouth every morning AND 1 tablet (20 mg total) every evening. 04/09/18   Larey Dresser, MD  levothyroxine (SYNTHROID, LEVOTHROID) 75 MCG tablet Take 1 tablet (75 mcg total) by mouth daily before breakfast. 09/26/17   Larey Dresser, MD  loperamide (IMODIUM A-D) 2 MG tablet Take 2 mg by mouth 4 (four) times daily as needed for diarrhea or loose stools.    [provider]  metoprolol succinate (TOPROL-XL) 25 MG 24 hr tablet Take 1 tablet (25 mg total) by mouth 2 (two) times daily. 03/31/18   Larey Dresser, MD  Multiple Vitamins-Minerals (PRESERVISION AREDS 2 PO) Take 2 tablets by mouth daily.     [provider]  pantoprazole (PROTONIX) 40 MG tablet  Take 1 tablet (40 mg total) by mouth daily. 06/24/17   Larey Dresser, MD  Probiotic Product (PROBIOTIC ADVANCED PO) Take 500 mg by mouth daily.     [provider]    Family History Family History  Problem Relation Age of Onset  . Alcohol abuse Brother   . Asthma Son     Social History Social History   Tobacco Use  . Smoking status: Never Smoker  . Smokeless tobacco: Current User    Types: Chew  Substance Use Topics  . Alcohol use: No  . Drug use: Not on file     Allergies   Patient has no known allergies.   Review of Systems Review of Systems Ten systems reviewed and are negative for acute change, except as noted in the HPI.    Physical Exam Updated Vital Signs BP (!) 169/61   Pulse (!) 56   Temp 98.1 F (36.7 C) (Oral)   Resp 14   SpO2 99%   Physical Exam  Constitutional: She is oriented to person, place, and time. She appears well-developed and well-nourished. No distress.  Nontoxic appearing and in NAD  HENT:  Head: Normocephalic and atraumatic.  Eyes: Conjunctivae and EOM are normal. No scleral icterus.  Neck: Normal range of motion.  Cardiovascular: Normal rate, regular rhythm and intact distal pulses.  Pulmonary/Chest: Effort normal. No stridor. No respiratory distress. She has no wheezes. She has no rales.  Lungs CTAB  Abdominal: Soft. She exhibits no distension and no mass. There is no tenderness. There is no guarding.  Soft, nontender abdomen.  Musculoskeletal: Normal range of motion.  Neurological: She is alert and oriented to person, place, and time. She exhibits normal muscle tone. Coordination normal.  Skin: Skin is warm and dry. No rash noted. She is not diaphoretic. No erythema. No pallor.  Psychiatric: She has a normal mood and affect. Her behavior is normal.  Nursing note and vitals reviewed.    ED Treatments / Results  Labs (all labs ordered are listed, but only abnormal results are displayed) Labs Reviewed  BASIC  METABOLIC PANEL - Abnormal; Notable for the following components:      Result Value   Potassium 5.5 (*)    Glucose, Bld 109 (*)    BUN 50 (*)    Creatinine, Ser 2.51 (*)    Calcium 8.8 (*)    GFR calc non Af Amer 15 (*)    GFR calc Af Amer 18 (*)    All other components within normal limits  CBC - Abnormal; Notable for  the following components:   RBC 3.30 (*)    Hemoglobin 7.9 (*)    HCT 27.3 (*)    MCH 23.9 (*)    MCHC 28.9 (*)    RDW 17.2 (*)    All other components within normal limits  URINALYSIS, ROUTINE W REFLEX MICROSCOPIC - Abnormal; Notable for the following components:   Color, Urine STRAW (*)    All other components within normal limits  HEPATIC FUNCTION PANEL - Abnormal; Notable for the following components:   Alkaline Phosphatase 218 (*)    All other components within normal limits  RETICULOCYTES - Abnormal; Notable for the following components:   RBC. 3.10 (*)    All other components within normal limits  VITAMIN B12  IRON AND TIBC  FERRITIN  FOLATE  CBG MONITORING, ED  TYPE AND SCREEN  ABO/RH    EKG EKG Interpretation  Date/Time:  Tuesday May 19 2018 18:52:26 EDT Ventricular Rate:  63 PR Interval:  206 QRS Duration: 136 QT Interval:  478 QTC Calculation: 489 R Axis:   -51 Text Interpretation:  Sinus rhythm with occasional Premature ventricular complexes Left axis deviation Left bundle branch block Abnormal ECG No significant change was found Confirmed by Jola Schmidt [phone]) on 05/20/2018 12:45:14 AM   Radiology No results found.  Procedures Procedures (including critical care time)  Medications Ordered in ED Medications - No data to display   Initial Impression / Assessment and Plan / ED Course  I have reviewed the triage vital signs and the nursing notes.  Pertinent labs & imaging results that were available during my care of the patient were reviewed by me and considered in my medical decision making (see chart for details).      84 year old female presents to the emergency department for evaluation after encouraged to present by her primary doctor.  She had a hemoglobin of 7.7 on outpatient check.  This was coupled with a potassium of 6.3.  Patient saw her primary doctor to establish herself as a new patient.  She had no symptoms preceding her appointment.  Patient reports feeling at baseline currently.  Baseline hemoglobin appears to be between 10 and 11.  Her hemoglobin on recheck in the emergency department is 7.9.  This appears to be a microcytic anemia that is favored to be chronic.  This may be in part due to her chronic kidney disease.  The patient denies any increased bleeding or bruising.  No melena, hematochezia, hematuria.  Denies recent fevers.  The patient has no tachycardia or hypotension to suggest acute or emergent blood loss.  We will send anemia panel.  Have encouraged outpatient follow-up with hematology as well as repeat CBC in 1 week.  Patient started on supplemental iron tablets.  Patient also with hyperkalemia outpatient.  This has spontaneously improved without intervention.  Potassium is 5.5 today.  She has no peaked T waves or other acute EKG changes.  I believe this can be trended outpatient as well.  Case discussed with my attending, Dr. Venora Maples, who is in agreement with this work-up, assessment, management plan, and patient stability for discharge.   Final Clinical Impressions(s) / ED Diagnoses   Final diagnoses:  Abnormal laboratory test  Chronic anemia  Hyperkalemia    ED Discharge Orders         Ordered    ferrous sulfate 325 (65 FE) MG tablet  Daily     05/20/18 0222           Antonietta Breach, PA-C  05/20/18 [place], MD 05/20/18 [phone]

## 2018-05-21 NOTE — Telephone Encounter (Signed)
Ok to refer to [facility]-  Dr. Betsy Coder for chronic anemia

## 2018-05-22 NOTE — Telephone Encounter (Signed)
Referral placed in Epic.

## 2018-05-28 ENCOUNTER — Ambulatory Visit (INDEPENDENT_AMBULATORY_CARE_PROVIDER_SITE_OTHER): Payer: PPO | Admitting: Adult Health

## 2018-05-28 ENCOUNTER — Telehealth: Payer: Self-pay | Admitting: Hematology and Oncology

## 2018-05-28 ENCOUNTER — Encounter: Payer: Self-pay | Admitting: Adult Health

## 2018-05-28 VITALS — BP 136/60 | Temp 98.0°F | Wt 132.0 lb

## 2018-05-28 DIAGNOSIS — E876 Hypokalemia: Secondary | ICD-10-CM | POA: Diagnosis not present

## 2018-05-28 DIAGNOSIS — D631 Anemia in chronic kidney disease: Secondary | ICD-10-CM

## 2018-05-28 DIAGNOSIS — N184 Chronic kidney disease, stage 4 (severe): Secondary | ICD-10-CM | POA: Diagnosis not present

## 2018-05-28 LAB — CBC WITH DIFFERENTIAL/PLATELET
Basophils Absolute: 0 10*3/uL (ref 0.0–0.1)
Basophils Relative: 0.7 % (ref 0.0–3.0)
EOS PCT: 5.4 % — AB (ref 0.0–5.0)
Eosinophils Absolute: 0.3 10*3/uL (ref 0.0–0.7)
Hemoglobin: 8.2 g/dL — ABNORMAL LOW (ref 12.0–15.0)
LYMPHS PCT: 27.6 % (ref 12.0–46.0)
Lymphs Abs: 1.6 10*3/uL (ref 0.7–4.0)
MCHC: 31.6 g/dL (ref 30.0–36.0)
MCV: 77.1 fl — ABNORMAL LOW (ref 78.0–100.0)
MONOS PCT: 11.5 % (ref 3.0–12.0)
Monocytes Absolute: 0.7 10*3/uL (ref 0.1–1.0)
NEUTROS ABS: 3.2 10*3/uL (ref 1.4–7.7)
Neutrophils Relative %: 54.8 % (ref 43.0–77.0)
PLATELETS: 385 10*3/uL (ref 150.0–400.0)
RBC: 3.36 Mil/uL — AB (ref 3.87–5.11)
RDW: 18.4 % — ABNORMAL HIGH (ref 11.5–15.5)
WBC: 5.9 10*3/uL (ref 4.0–10.5)

## 2018-05-28 LAB — BASIC METABOLIC PANEL
BUN: 49 mg/dL — AB (ref 6–23)
CHLORIDE: 103 meq/L (ref 96–112)
CO2: 25 meq/L (ref 19–32)
CREATININE: 2.58 mg/dL — AB (ref 0.40–1.20)
Calcium: 8.9 mg/dL (ref 8.4–10.5)
GFR: 18.33 mL/min — ABNORMAL LOW (ref >60.00)
Glucose, Bld: 86 mg/dL (ref 70–99)
Potassium: 5 mEq/L (ref 3.5–5.1)
Sodium: 138 mEq/L (ref 135–145)

## 2018-05-28 NOTE — Progress Notes (Signed)
Subjective:    Patient ID: Mary Guzman, female    DOB: [birth year], 84 y.o.   MRN: [id number]  HPI 84 year old female who  has a past medical history of Cancer ([place]), DJD (degenerative joint disease), GERD (gastroesophageal reflux disease), Hypertension, Osteoporosis, and Thyroid disease.  She presents to the office today for follow up after recent ER visit. She was sent to the ER after seeing me, her potassium level was 6.3 and hemoglobin of 7.7. Her baseline Hemoglobin is between 10 and 11. In the ER recheck hemoglobin was 7.9 and K was 5.5  She was advised to start an iron supplement.  Today in the office she reports that she has started taking an iron supplement and over the past 2-3 days has noticed that her fatigue is improving. She has not had any constipation.  She has no acute complaints today    Review of Systems See HPI   Past Medical History:  Diagnosis Date  . Cancer ([place])    Breast Hx of stage IIB, moderately differentiated with 3 of 8 possible lymph nodes.  . DJD (degenerative joint disease)   . GERD (gastroesophageal reflux disease)   . Hypertension   . Osteoporosis   . Thyroid disease    Hypothyroidism    Social History   Socioeconomic History  . Marital status: Widowed    Spouse name: Not on file  . Number of children: Not on file  . Years of education: Not on file  . Highest education level: Not on file  Occupational History  . Not on file  Social Needs  . Financial resource strain: Not on file  . Food insecurity:    Worry: Not on file    Inability: Not on file  . Transportation needs:    Medical: Not on file    Non-medical: Not on file  Tobacco Use  . Smoking status: Never Smoker  . Smokeless tobacco: Current User    Types: Chew  Substance and Sexual Activity  . Alcohol use: No  . Drug use: Not on file  . Sexual activity: Not on file  Lifestyle  . Physical activity:    Days per week: Not on file    Minutes per session: Not on file  .  Stress: Not on file  Relationships  . Social connections:    Talks on phone: Not on file    Gets together: Not on file    Attends religious service: Not on file    Active member of club or organization: Not on file    Attends meetings of clubs or organizations: Not on file    Relationship status: Not on file  . Intimate partner violence:    Fear of current or ex partner: Not on file    Emotionally abused: Not on file    Physically abused: Not on file    Forced sexual activity: Not on file  Other Topics Concern  . Not on file  Social History Narrative   Fairly active interaction with grandchildren.    Past Surgical History:  Procedure Laterality Date  . ANKLE SURGERY     Right  . INTRACAPSULAR CATARACT EXTRACTION    . MASTECTOMY  1994    Family History  Problem Relation Age of Onset  . Alcohol abuse Brother   . Asthma Son     No Known Allergies  Current Outpatient Medications on File Prior to Visit  Medication Sig Dispense Refill  . acetaminophen (TYLENOL) 500 MG  tablet Take 500 mg by mouth every 6 (six) hours as needed for mild pain.     Marland Kitchen amLODipine (NORVASC) 2.5 MG tablet TAKE 1 TABLET(2.5 MG) BY MOUTH DAILY 90 tablet 0  . aspirin EC 81 MG tablet Take 1 tablet (81 mg total) by mouth daily. 30 tablet 6  . Calcium Carb-Cholecalciferol (CALCIUM 600+D) 600-800 MG-UNIT TABS Take 1 tablet by mouth 2 (two) times daily.    . carboxymethylcellulose (REFRESH PLUS) 0.5 % SOLN Place 1 drop into both eyes 3 (three) times daily as needed (dry eye).    . Cholecalciferol (VITAMIN D3) 5000 UNITS CAPS Take 1 capsule by mouth daily.    . ferrous sulfate 325 (65 FE) MG tablet Take 1 tablet (325 mg total) by mouth daily. 30 tablet 1  . furosemide (LASIX) 20 MG tablet Take 2 tablets (40 mg total) by mouth every morning AND 1 tablet (20 mg total) every evening. 270 tablet 0  . levothyroxine (SYNTHROID, LEVOTHROID) 75 MCG tablet Take 1 tablet (75 mcg total) by mouth daily before breakfast. 90  tablet 3  . loperamide (IMODIUM A-D) 2 MG tablet Take 2 mg by mouth 4 (four) times daily as needed for diarrhea or loose stools.    . metoprolol succinate (TOPROL-XL) 25 MG 24 hr tablet Take 1 tablet (25 mg total) by mouth 2 (two) times daily. 180 tablet 0  . Multiple Vitamins-Minerals (PRESERVISION AREDS 2 PO) Take 2 tablets by mouth daily.     . pantoprazole (PROTONIX) 40 MG tablet Take 1 tablet (40 mg total) by mouth daily. 30 tablet 6  . Probiotic Product (PROBIOTIC ADVANCED PO) Take 500 mg by mouth daily.      No current facility-administered medications on file prior to visit.     BP 136/60   Temp 98 F (36.7 C) (Oral)   Wt 132 lb (59.9 kg)   BMI 24.14 kg/m       Objective:   Physical Exam  Constitutional: She is oriented to person, place, and time. She appears well-developed and well-nourished. No distress.  Cardiovascular: Normal rate, regular rhythm, normal heart sounds and intact distal pulses.  Pulmonary/Chest: Effort normal and breath sounds normal.  Neurological: She is alert and oriented to person, place, and time.  Skin: Skin is warm and dry. She is not diaphoretic.  Psychiatric: She has a normal mood and affect. Her behavior is normal. Judgment and thought content normal.  Vitals reviewed.     Assessment & Plan:  1. Hypokalemia - Basic Metabolic Panel  2. Anemia due to stage 4 chronic kidney disease (HCC)  - CBC with Differential/Platelet - Iron, TIBC and Ferritin Panel  [name], NP

## 2018-05-28 NOTE — Telephone Encounter (Signed)
lft the pt's daughter a vm to schedule an appt w/Dr. Alvy Bimler

## 2018-05-29 LAB — IRON,TIBC AND FERRITIN PANEL
%SAT: 8 % (calc) — ABNORMAL LOW (ref 16–45)
Ferritin: 24 ng/mL (ref 16–288)
Iron: 28 ug/dL — ABNORMAL LOW (ref 45–160)
TIBC: 358 mcg/dL (calc) (ref 250–450)

## 2018-06-01 ENCOUNTER — Other Ambulatory Visit: Payer: Self-pay

## 2018-06-01 ENCOUNTER — Inpatient Hospital Stay: Payer: PPO | Attending: Hematology and Oncology | Admitting: Hematology and Oncology

## 2018-06-01 ENCOUNTER — Telehealth: Payer: Self-pay | Admitting: Hematology and Oncology

## 2018-06-01 ENCOUNTER — Inpatient Hospital Stay: Payer: PPO

## 2018-06-01 DIAGNOSIS — D509 Iron deficiency anemia, unspecified: Secondary | ICD-10-CM | POA: Insufficient documentation

## 2018-06-01 DIAGNOSIS — I10 Essential (primary) hypertension: Secondary | ICD-10-CM

## 2018-06-01 DIAGNOSIS — I129 Hypertensive chronic kidney disease with stage 1 through stage 4 chronic kidney disease, or unspecified chronic kidney disease: Secondary | ICD-10-CM | POA: Diagnosis not present

## 2018-06-01 DIAGNOSIS — N184 Chronic kidney disease, stage 4 (severe): Principal | ICD-10-CM

## 2018-06-01 DIAGNOSIS — D631 Anemia in chronic kidney disease: Secondary | ICD-10-CM

## 2018-06-01 DIAGNOSIS — D500 Iron deficiency anemia secondary to blood loss (chronic): Secondary | ICD-10-CM

## 2018-06-01 DIAGNOSIS — Z853 Personal history of malignant neoplasm of breast: Secondary | ICD-10-CM | POA: Insufficient documentation

## 2018-06-01 DIAGNOSIS — D539 Nutritional anemia, unspecified: Secondary | ICD-10-CM

## 2018-06-01 LAB — CBC WITH DIFFERENTIAL (CANCER CENTER ONLY)
BASOS ABS: 0.1 10*3/uL (ref 0.0–0.1)
Basophils Relative: 1 %
EOS PCT: 4 %
Eosinophils Absolute: 0.3 10*3/uL (ref 0.0–0.5)
HCT: 26.7 % — ABNORMAL LOW (ref 34.8–46.6)
Hemoglobin: 8.3 g/dL — ABNORMAL LOW (ref 11.6–15.9)
Lymphocytes Relative: 21 %
Lymphs Abs: 1.3 10*3/uL (ref 0.9–3.3)
MCH: 24.6 pg — ABNORMAL LOW (ref 25.1–34.0)
MCHC: 31 g/dL — ABNORMAL LOW (ref 31.5–36.0)
MCV: 79.1 fL — AB (ref 79.5–101.0)
MONO ABS: 0.7 10*3/uL (ref 0.1–0.9)
Monocytes Relative: 11 %
Neutro Abs: 4 10*3/uL (ref 1.5–6.5)
Neutrophils Relative %: 63 %
PLATELETS: 339 10*3/uL (ref 145–400)
RBC: 3.37 MIL/uL — ABNORMAL LOW (ref 3.70–5.45)
RDW: 20.6 % — AB (ref 11.2–14.5)
WBC Count: 6.3 10*3/uL (ref 3.9–10.3)

## 2018-06-01 LAB — IRON AND TIBC
IRON: 20 ug/dL — AB (ref 41–142)
SATURATION RATIOS: 4 % — AB (ref 21–57)
TIBC: 444 ug/dL (ref 236–444)
UIBC: 424 ug/dL

## 2018-06-01 LAB — SAMPLE TO BLOOD BANK

## 2018-06-01 LAB — FERRITIN: Ferritin: 22 ng/mL (ref 11–307)

## 2018-06-01 NOTE — Telephone Encounter (Signed)
Gave avs and calendar ° °

## 2018-06-02 ENCOUNTER — Encounter: Payer: Self-pay | Admitting: Hematology and Oncology

## 2018-06-02 DIAGNOSIS — D539 Nutritional anemia, unspecified: Secondary | ICD-10-CM | POA: Insufficient documentation

## 2018-06-02 DIAGNOSIS — N184 Chronic kidney disease, stage 4 (severe): Secondary | ICD-10-CM | POA: Insufficient documentation

## 2018-06-02 NOTE — Assessment & Plan Note (Signed)
She has severe stage IV chronic kidney disease She does not follow with nephrologist I recommend consideration for referral to see nephrologist It is not clear to me whether she might be a candidate for hemodialysis given her age and comorbidities.

## 2018-06-02 NOTE — Assessment & Plan Note (Signed)
She has elevated blood pressure today but could be due to anxiety We discussed importance of good control of blood pressure to minimize further risk of kidney damage and exacerbation of congestive heart failure

## 2018-06-02 NOTE — Assessment & Plan Note (Signed)
She has some multifactorial anemia, likely anemia due to iron deficiency from chronic blood loss and anemia chronic renal failure The most likely cause of her anemia is due to chronic blood loss/malabsorption syndrome. We discussed some of the risks, benefits, and alternatives of intravenous iron infusions. The patient is symptomatic from anemia and the iron level is critically low. She tolerated oral iron supplement poorly and desires to achieved higher levels of iron faster for adequate hematopoesis. Some of the side-effects to be expected including risks of infusion reactions, phlebitis, headaches, nausea and fatigue.  The patient is willing to proceed. Patient education material was dispensed.  Goal is to keep ferritin level greater than 50 and resolution of anemia If she has no reaction to IV iron, potentially, she can get intravenous iron at home I plan to see her back within the month of completion of IV iron.  I will check serum erythropoietin level in her next visit. If she remains anemic with hemoglobin less than 10, I will consider starting her on darbepoetin injection

## 2018-06-02 NOTE — Progress Notes (Signed)
[facility] OFFICE PROGRESS NOTE  Patient Care Team: [name], NP as PCP - General (Family Medicine)  ASSESSMENT & PLAN:  Iron deficiency anemia due to chronic blood loss She has some multifactorial anemia, likely anemia due to iron deficiency from chronic blood loss and anemia chronic renal failure The most likely cause of her anemia is due to chronic blood loss/malabsorption syndrome. We discussed some of the risks, benefits, and alternatives of intravenous iron infusions. The patient is symptomatic from anemia and the iron level is critically low. She tolerated oral iron supplement poorly and desires to achieved higher levels of iron faster for adequate hematopoesis. Some of the side-effects to be expected including risks of infusion reactions, phlebitis, headaches, nausea and fatigue.  The patient is willing to proceed. Patient education material was dispensed.  Goal is to keep ferritin level greater than 50 and resolution of anemia If she has no reaction to IV iron, potentially, she can get intravenous iron at home I plan to see her back within the month of completion of IV iron.  I will check serum erythropoietin level in her next visit. If she remains anemic with hemoglobin less than 10, I will consider starting her on darbepoetin injection   Chronic kidney disease (CKD), stage IV (severe) ([place]) She has severe stage IV chronic kidney disease She does not follow with nephrologist I recommend consideration for referral to see nephrologist It is not clear to me whether she might be a candidate for hemodialysis given her age and comorbidities.  Essential hypertension She has elevated blood pressure today but could be due to anxiety We discussed importance of good control of blood pressure to minimize further risk of kidney damage and exacerbation of congestive heart failure  BREAST CANCER, HX OF Examination is satisfactory She has no signs of cancer recurrence on  exam Given her age, I do not recommend surveillance mammogram.   Orders Placed This Encounter  Procedures  . Ferritin    Standing Status:   Future    Standing Expiration Date:   06/02/2019  . Iron and TIBC    Standing Status:   Future    Standing Expiration Date:   07/07/2019  . Vitamin B12    Standing Status:   Future    Standing Expiration Date:   07/07/2019  . Sedimentation rate    Standing Status:   Future    Standing Expiration Date:   07/07/2019  . Folate RBC    Standing Status:   Future    Standing Expiration Date:   07/07/2019  . CBC with Differential/Platelet    Standing Status:   Future    Standing Expiration Date:   07/07/2019  . Comprehensive metabolic panel    Standing Status:   Future    Standing Expiration Date:   07/07/2019  . Erythropoietin    Standing Status:   Future    Standing Expiration Date:   07/07/2019    INTERVAL HISTORY: Please see below for problem oriented charting. She returns to be seen due to severe iron deficiency anemia She complained of fatigue The patient denies any recent signs or symptoms of bleeding such as spontaneous epistaxis, hematuria or hematochezia. She is on chronic antiplatelet agent for heart disease She denies any recent abnormal breast examination, palpable mass, abnormal breast appearance or nipple changes She does not follow with nephrologist for chronic kidney disease No recent symptoms of exacerbation of congestive heart failure.  Denies recent infection  SUMMARY OF ONCOLOGIC HISTORY:  I have reviewed her chart extensively. She has a background history of left breast cancer, moderately differentiated, sized unknown but with involvement of 3/8 lymph nodes, ER positive, PR negative, HER-2/neu unknown. The patient underwent left breast modified radical mastectomy in 1994, followed by radiation therapy. She was placed on tamoxifen for 5 years. She was then placed on Evista, discontinue due to recent acute heart  failure  REVIEW OF SYSTEMS:   Constitutional: Denies fevers, chills or abnormal weight loss Eyes: Denies blurriness of vision Ears, nose, mouth, throat, and face: Denies mucositis or sore throat Respiratory: Denies cough, dyspnea or wheezes Cardiovascular: Denies palpitation, chest discomfort or lower extremity swelling Gastrointestinal:  Denies nausea, heartburn or change in bowel habits Skin: Denies abnormal skin rashes Lymphatics: Denies new lymphadenopathy or easy bruising Neurological:Denies numbness, tingling or new weaknesses Behavioral/Psych: Mood is stable, no new changes  All other systems were reviewed with the patient and are negative.  I have reviewed the past medical history, past surgical history, social history and family history with the patient and they are unchanged from previous note.  ALLERGIES:  has No Known Allergies.  MEDICATIONS:  Current Outpatient Medications  Medication Sig Dispense Refill  . acetaminophen (TYLENOL) 500 MG tablet Take 500 mg by mouth every 6 (six) hours as needed for mild pain.     Marland Kitchen amLODipine (NORVASC) 2.5 MG tablet TAKE 1 TABLET(2.5 MG) BY MOUTH DAILY 90 tablet 0  . aspirin EC 81 MG tablet Take 1 tablet (81 mg total) by mouth daily. 30 tablet 6  . Calcium Carb-Cholecalciferol (CALCIUM 600+D) 600-800 MG-UNIT TABS Take 1 tablet by mouth 2 (two) times daily.    . carboxymethylcellulose (REFRESH PLUS) 0.5 % SOLN Place 1 drop into both eyes 3 (three) times daily as needed (dry eye).    . Cholecalciferol (VITAMIN D3) 5000 UNITS CAPS Take 1 capsule by mouth daily.    . ferrous sulfate 325 (65 FE) MG tablet Take 1 tablet (325 mg total) by mouth daily. 30 tablet 1  . furosemide (LASIX) 20 MG tablet Take 2 tablets (40 mg total) by mouth every morning AND 1 tablet (20 mg total) every evening. 270 tablet 0  . levothyroxine (SYNTHROID, LEVOTHROID) 75 MCG tablet Take 1 tablet (75 mcg total) by mouth daily before breakfast. 90 tablet 3  . loperamide  (IMODIUM A-D) 2 MG tablet Take 2 mg by mouth 4 (four) times daily as needed for diarrhea or loose stools.    . metoprolol succinate (TOPROL-XL) 25 MG 24 hr tablet Take 1 tablet (25 mg total) by mouth 2 (two) times daily. 180 tablet 0  . Multiple Vitamins-Minerals (PRESERVISION AREDS 2 PO) Take 2 tablets by mouth daily.     . pantoprazole (PROTONIX) 40 MG tablet Take 1 tablet (40 mg total) by mouth daily. 30 tablet 6  . Probiotic Product (PROBIOTIC ADVANCED PO) Take 500 mg by mouth daily.      No current facility-administered medications for this visit.     PHYSICAL EXAMINATION: ECOG PERFORMANCE STATUS: 2 - Symptomatic, <50% confined to bed  Vitals:   06/01/18 1315  BP: (!) 161/55  Pulse: (!) 56  Resp: 17  Temp: 97.8 F (36.6 C)  SpO2: 97%   Filed Weights   06/01/18 1315  Weight: 131 lb (59.4 kg)    GENERAL:alert, no distress and comfortable.  She looks elderly and debilitated SKIN: Noted significant skin bruising EYES: normal, Conjunctiva are pink and non-injected, sclera clear OROPHARYNX:no exudate, no erythema and lips, buccal mucosa,  and tongue normal  NECK: supple, thyroid normal size, non-tender, without nodularity LYMPH:  no palpable lymphadenopathy in the cervical, axillary or inguinal LUNGS: clear to auscultation and percussion with normal breathing effort HEART: regular rate & rhythm and no murmurs and no lower extremity edema ABDOMEN:abdomen soft, non-tender and normal bowel sounds Musculoskeletal:no cyanosis of digits and no clubbing  NEURO: alert & oriented x 3 with fluent speech, no focal motor/sensory deficits Bilateral chest wall examination was performed.  Well-healed mastectomy scar on the left.  Normal breast exam on the right  LABORATORY DATA:  I have reviewed the data as listed    Component Value Date/Time   NA 138 05/28/2018 1324   NA 140 06/02/2013 1347   K 5.0 05/28/2018 1324   K 5.8 (H) 06/02/2013 1347   CL 103 05/28/2018 1324   CL 104  05/14/2012 1522   CO2 25 05/28/2018 1324   CO2 20 (L) 06/02/2013 1347   GLUCOSE 86 05/28/2018 1324   GLUCOSE 97 06/02/2013 1347   GLUCOSE 91 05/14/2012 1522   BUN 49 (H) 05/28/2018 1324   BUN 50.1 (H) 06/02/2013 1347   CREATININE 2.58 (H) 05/28/2018 1324   CREATININE 2.3 (H) 06/02/2013 1347   CALCIUM 8.9 05/28/2018 1324   CALCIUM 8.8 06/02/2013 1347   PROT 6.8 05/19/2018 1927   PROT 6.9 06/02/2013 1347   ALBUMIN 3.6 05/19/2018 1927   ALBUMIN 3.6 06/02/2013 1347   AST 40 05/19/2018 1927   AST 26 06/02/2013 1347   ALT 20 05/19/2018 1927   ALT 14 06/02/2013 1347   ALKPHOS 218 (H) 05/19/2018 1927   ALKPHOS 54 06/02/2013 1347   BILITOT 0.7 05/19/2018 1927   BILITOT 0.23 06/02/2013 1347   GFRNONAA 15 (L) 05/19/2018 1853   GFRAA 18 (L) 05/19/2018 1853    No results found for: SPEP, UPEP  Lab Results  Component Value Date   WBC 6.3 06/01/2018   NEUTROABS 4.0 06/01/2018   HGB 8.3 (L) 06/01/2018   HCT 26.7 (L) 06/01/2018   MCV 79.1 (L) 06/01/2018   PLT 339 06/01/2018      Chemistry      Component Value Date/Time   NA 138 05/28/2018 1324   NA 140 06/02/2013 1347   K 5.0 05/28/2018 1324   K 5.8 (H) 06/02/2013 1347   CL 103 05/28/2018 1324   CL 104 05/14/2012 1522   CO2 25 05/28/2018 1324   CO2 20 (L) 06/02/2013 1347   BUN 49 (H) 05/28/2018 1324   BUN 50.1 (H) 06/02/2013 1347   CREATININE 2.58 (H) 05/28/2018 1324   CREATININE 2.3 (H) 06/02/2013 1347      Component Value Date/Time   CALCIUM 8.9 05/28/2018 1324   CALCIUM 8.8 06/02/2013 1347   ALKPHOS 218 (H) 05/19/2018 1927   ALKPHOS 54 06/02/2013 1347   AST 40 05/19/2018 1927   AST 26 06/02/2013 1347   ALT 20 05/19/2018 1927   ALT 14 06/02/2013 1347   BILITOT 0.7 05/19/2018 1927   BILITOT 0.23 06/02/2013 1347      All questions were answered. The patient knows to call the clinic with any problems, questions or concerns. No barriers to learning was detected.  I spent 25 minutes counseling the patient face to  face. The total time spent in the appointment was 30 minutes and more than 50% was on counseling and review of test results  Heath Lark, MD 06/02/2018 5:47 PM

## 2018-06-02 NOTE — Assessment & Plan Note (Signed)
Examination is satisfactory She has no signs of cancer recurrence on exam Given her age, I do not recommend surveillance mammogram.

## 2018-06-08 ENCOUNTER — Inpatient Hospital Stay: Payer: PPO

## 2018-06-08 VITALS — BP 150/54 | HR 54 | Temp 97.7°F | Resp 16

## 2018-06-08 DIAGNOSIS — D500 Iron deficiency anemia secondary to blood loss (chronic): Secondary | ICD-10-CM

## 2018-06-08 DIAGNOSIS — D509 Iron deficiency anemia, unspecified: Secondary | ICD-10-CM | POA: Diagnosis not present

## 2018-06-08 MED ORDER — SODIUM CHLORIDE 0.9 % IV SOLN
Freq: Once | INTRAVENOUS | Status: AC
  Administered 2018-06-08: 10:00:00 via INTRAVENOUS
  Filled 2018-06-08: qty 250

## 2018-06-08 MED ORDER — SODIUM CHLORIDE 0.9 % IV SOLN
510.0000 mg | Freq: Once | INTRAVENOUS | Status: AC
  Administered 2018-06-08: 510 mg via INTRAVENOUS
  Filled 2018-06-08: qty 17

## 2018-06-08 NOTE — Patient Instructions (Signed)

## 2018-06-09 ENCOUNTER — Telehealth: Payer: Self-pay | Admitting: Hematology and Oncology

## 2018-06-09 ENCOUNTER — Telehealth: Payer: Self-pay

## 2018-06-09 NOTE — Telephone Encounter (Signed)
Called daughter per Dr. Alvy Bimler. Please call her daughter. Her medicare will not cover IV feraheme at all. We will have to schedule second dose here. Left message for daughter to call if she has questions. Scheduling message sent.

## 2018-06-09 NOTE — Telephone Encounter (Signed)
Per 9/24 sch message - left message for patient daughter to call back to set up appt per 9/24 sch message -

## 2018-06-10 ENCOUNTER — Telehealth: Payer: Self-pay | Admitting: Hematology and Oncology

## 2018-06-10 NOTE — Telephone Encounter (Signed)
Scheduled appt per 9/24 sch message pt daughter is aware of appt date and time.

## 2018-06-15 ENCOUNTER — Inpatient Hospital Stay: Payer: PPO

## 2018-06-15 VITALS — BP 155/67 | HR 60 | Temp 97.6°F | Resp 12

## 2018-06-15 DIAGNOSIS — D509 Iron deficiency anemia, unspecified: Secondary | ICD-10-CM | POA: Diagnosis not present

## 2018-06-15 DIAGNOSIS — D500 Iron deficiency anemia secondary to blood loss (chronic): Secondary | ICD-10-CM

## 2018-06-15 MED ORDER — SODIUM CHLORIDE 0.9 % IV SOLN
Freq: Once | INTRAVENOUS | Status: AC
  Administered 2018-06-15: 14:00:00 via INTRAVENOUS
  Filled 2018-06-15: qty 250

## 2018-06-15 MED ORDER — SODIUM CHLORIDE 0.9 % IV SOLN
510.0000 mg | Freq: Once | INTRAVENOUS | Status: AC
  Administered 2018-06-15: 510 mg via INTRAVENOUS
  Filled 2018-06-15: qty 17

## 2018-06-15 NOTE — Patient Instructions (Signed)

## 2018-06-19 ENCOUNTER — Telehealth: Payer: Self-pay

## 2018-06-19 NOTE — Telephone Encounter (Signed)
-----   Message from Gaspar Bidding sent at 06/19/2018  6:09 AM EDT ----- Regarding: RE: Billing Hello Hassan Rowan,  Please advise patient to call billing at [phone]. They should be able to assist her.  [place] ----- Message ----- From: Flo Shanks, RN Sent: 06/18/2018   4:34 PM EDT To: Gaspar Bidding Subject: Billing                                        Levie Heritage,  Ms. Yoshimura daughter called and left a message that [employer] called today. IV Feraheme is not covered under Part D and they are denying the claims from her recent visits. They think is covered under Part B.  Can you help with this? Or tell me who to contact.  Thanks!  Hassan Rowan

## 2018-06-19 NOTE — Telephone Encounter (Signed)
Called and given below message. She verbalized understanding. Daughter will call billing department.

## 2018-06-22 ENCOUNTER — Other Ambulatory Visit (HOSPITAL_COMMUNITY): Payer: Self-pay | Admitting: Internal Medicine

## 2018-06-24 ENCOUNTER — Other Ambulatory Visit (HOSPITAL_COMMUNITY): Payer: Self-pay

## 2018-06-24 MED ORDER — METOPROLOL SUCCINATE ER 25 MG PO TB24: 25.0000 mg | ORAL_TABLET | Freq: Two times a day (BID) | ORAL | 1 refills | Status: DC

## 2018-06-27 ENCOUNTER — Other Ambulatory Visit (HOSPITAL_COMMUNITY): Payer: Self-pay | Admitting: Cardiology

## 2018-07-02 ENCOUNTER — Encounter: Payer: Self-pay | Admitting: Hematology and Oncology

## 2018-07-02 ENCOUNTER — Inpatient Hospital Stay: Payer: PPO | Attending: Hematology and Oncology | Admitting: Hematology and Oncology

## 2018-07-02 ENCOUNTER — Telehealth: Payer: Self-pay | Admitting: Hematology and Oncology

## 2018-07-02 ENCOUNTER — Inpatient Hospital Stay: Payer: PPO

## 2018-07-02 VITALS — BP 163/65 | HR 54 | Temp 97.4°F | Resp 17 | Ht 62.0 in | Wt 130.0 lb

## 2018-07-02 DIAGNOSIS — D631 Anemia in chronic kidney disease: Secondary | ICD-10-CM | POA: Diagnosis not present

## 2018-07-02 DIAGNOSIS — Z79899 Other long term (current) drug therapy: Secondary | ICD-10-CM | POA: Diagnosis not present

## 2018-07-02 DIAGNOSIS — D539 Nutritional anemia, unspecified: Secondary | ICD-10-CM

## 2018-07-02 DIAGNOSIS — N184 Chronic kidney disease, stage 4 (severe): Secondary | ICD-10-CM | POA: Diagnosis not present

## 2018-07-02 DIAGNOSIS — I129 Hypertensive chronic kidney disease with stage 1 through stage 4 chronic kidney disease, or unspecified chronic kidney disease: Secondary | ICD-10-CM | POA: Insufficient documentation

## 2018-07-02 DIAGNOSIS — D500 Iron deficiency anemia secondary to blood loss (chronic): Secondary | ICD-10-CM | POA: Insufficient documentation

## 2018-07-02 DIAGNOSIS — I10 Essential (primary) hypertension: Secondary | ICD-10-CM

## 2018-07-02 LAB — VITAMIN B12: VITAMIN B 12: 287 pg/mL (ref 180–914)

## 2018-07-02 LAB — COMPREHENSIVE METABOLIC PANEL
ALBUMIN: 3.6 g/dL (ref 3.5–5.0)
ALT: 27 U/L (ref 0–44)
AST: 34 U/L (ref 15–41)
Alkaline Phosphatase: 320 U/L — ABNORMAL HIGH (ref 38–126)
Anion gap: 13 (ref 5–15)
BUN: 62 mg/dL — AB (ref 8–23)
CHLORIDE: 105 mmol/L (ref 98–111)
CO2: 24 mmol/L (ref 22–32)
Calcium: 9.2 mg/dL (ref 8.9–10.3)
Creatinine, Ser: 2.65 mg/dL — ABNORMAL HIGH (ref 0.44–1.00)
GFR calc Af Amer: 17 mL/min — ABNORMAL LOW (ref >60)
GFR calc non Af Amer: 14 mL/min — ABNORMAL LOW (ref >60)
GLUCOSE: 88 mg/dL (ref 70–99)
POTASSIUM: 4.4 mmol/L (ref 3.5–5.1)
SODIUM: 142 mmol/L (ref 135–145)
Total Bilirubin: 0.3 mg/dL (ref 0.3–1.2)
Total Protein: 7.1 g/dL (ref 6.5–8.1)

## 2018-07-02 LAB — CBC WITH DIFFERENTIAL/PLATELET
ABS IMMATURE GRANULOCYTES: 0.02 10*3/uL (ref 0.00–0.07)
BASOS ABS: 0 10*3/uL (ref 0.0–0.1)
Basophils Relative: 1 %
EOS PCT: 5 %
Eosinophils Absolute: 0.3 10*3/uL (ref 0.0–0.5)
HEMATOCRIT: 32.5 % — AB (ref 36.0–46.0)
HEMOGLOBIN: 9.9 g/dL — AB (ref 12.0–15.0)
Immature Granulocytes: 0 %
LYMPHS ABS: 1.4 10*3/uL (ref 0.7–4.0)
LYMPHS PCT: 21 %
MCH: 26.8 pg (ref 26.0–34.0)
MCHC: 30.5 g/dL (ref 30.0–36.0)
MCV: 88.1 fL (ref 80.0–100.0)
Monocytes Absolute: 0.6 10*3/uL (ref 0.1–1.0)
Monocytes Relative: 9 %
NEUTROS ABS: 4.4 10*3/uL (ref 1.7–7.7)
Neutrophils Relative %: 64 %
Platelets: 259 10*3/uL (ref 150–400)
RBC: 3.69 MIL/uL — ABNORMAL LOW (ref 3.87–5.11)
RDW: 22.1 % — ABNORMAL HIGH (ref 11.5–15.5)
WBC: 6.7 10*3/uL (ref 4.0–10.5)
nRBC: 0 % (ref 0.0–0.2)

## 2018-07-02 LAB — IRON AND TIBC
Iron: 48 ug/dL (ref 41–142)
Saturation Ratios: 19 % — ABNORMAL LOW (ref 21–57)
TIBC: 256 ug/dL (ref 236–444)
UIBC: 209 ug/dL

## 2018-07-02 LAB — SEDIMENTATION RATE: Sed Rate: 62 mm/hr — ABNORMAL HIGH (ref 0–22)

## 2018-07-02 LAB — FERRITIN: Ferritin: 1064 ng/mL — ABNORMAL HIGH (ref 11–307)

## 2018-07-02 NOTE — Progress Notes (Signed)
[facility] OFFICE PROGRESS NOTE  Nafziger, Tommi Rumps, NP  ASSESSMENT & PLAN:  Iron deficiency anemia due to chronic blood loss Iron studies as improved We will continue close monitoring and replace as needed  Anemia in chronic kidney disease We discussed some of the risks, benefits, and alternatives of erythropoietin stimulating agents such as Procrit or Aranesp. The patient is symptomatic from anemia and the EPO level is low. Some of the side-effects to be expected including risks of allergic reactions, skin rashes, headaches, risk of blood clots including heart attack and stroke. There is rare risks of causing growth of cancers.The patient is willing to proceed and went ahead to sign consent today.  We will start ESA next week with goal to keep hemoglobin greater than 11  Chronic kidney disease (CKD), stage IV (severe) ([place]) She does not follow with nephrologist I recommend referral and she agreed  Essential hypertension She has poorly controlled hypertension I recommend close monitoring of blood pressure at home and to call her primary care doctor for medication adjustment   Orders Placed This Encounter  Procedures  . CBC with Differential/Platelet    Standing Status:   Standing    Number of Occurrences:   22    Standing Expiration Date:   07/03/2019  . Ambulatory referral to Nephrology    Referral Priority:   Routine    Referral Type:   Consultation    Referral Reason:   Specialty Services Required    Referred to Provider:   Rosita Fire, MD    Requested Specialty:   Nephrology    Number of Visits Requested:   1    INTERVAL HISTORY: Mary Guzman 85 y.o. female returns for follow-up of anemia She tolerated iron infusion well She has great improvement of energy level since iron infusion The patient denies any recent signs or symptoms of bleeding such as spontaneous epistaxis, hematuria or hematochezia. No recent infection, fever or chills  SUMMARY  OF HEMATOLOGIC HISTORY:  I have reviewed her chart extensively. She has a background history of left breast cancer, moderately differentiated, sized unknown but with involvement of 3/8 lymph nodes, ER positive, PR negative, HER-2/neu unknown. The patient underwent left breast modified radical mastectomy in 1994, followed by radiation therapy. She was placed on tamoxifen for 5 years. She was then placed on Evista, discontinue due to recent acute heart failure She is then found to have severe iron deficiency anemia and anemia chronic disease She received intravenous iron infusion  I have reviewed the past medical history, past surgical history, social history and family history with the patient and they are unchanged from previous note.  ALLERGIES:  has No Known Allergies.  MEDICATIONS:  Current Outpatient Medications  Medication Sig Dispense Refill  . acetaminophen (TYLENOL) 500 MG tablet Take 500 mg by mouth every 6 (six) hours as needed for mild pain.     Marland Kitchen amLODipine (NORVASC) 2.5 MG tablet TAKE 1 TABLET(2.5 MG) BY MOUTH DAILY 90 tablet 1  . aspirin EC 81 MG tablet Take 1 tablet (81 mg total) by mouth daily. 30 tablet 6  . Calcium Carb-Cholecalciferol (CALCIUM 600+D) 600-800 MG-UNIT TABS Take 1 tablet by mouth 2 (two) times daily.    . carboxymethylcellulose (REFRESH PLUS) 0.5 % SOLN Place 1 drop into both eyes 3 (three) times daily as needed (dry eye).    . Cholecalciferol (VITAMIN D3) 5000 UNITS CAPS Take 1 capsule by mouth daily.    . furosemide (LASIX) 20 MG tablet  Take 2 tablets (40 mg total) by mouth every morning AND 1 tablet (20 mg total) every evening. 270 tablet 0  . levothyroxine (SYNTHROID, LEVOTHROID) 75 MCG tablet Take 1 tablet (75 mcg total) by mouth daily before breakfast. 90 tablet 3  . loperamide (IMODIUM A-D) 2 MG tablet Take 2 mg by mouth 4 (four) times daily as needed for diarrhea or loose stools.    . metoprolol succinate (TOPROL-XL) 25 MG 24 hr tablet Take 1 tablet (25  mg total) by mouth 2 (two) times daily. 180 tablet 1  . Multiple Vitamins-Minerals (PRESERVISION AREDS 2 PO) Take 2 tablets by mouth daily.     . pantoprazole (PROTONIX) 40 MG tablet TAKE 1 TABLET(40 MG) BY MOUTH DAILY 30 tablet 3  . Probiotic Product (PROBIOTIC ADVANCED PO) Take 500 mg by mouth daily.      No current facility-administered medications for this visit.      REVIEW OF SYSTEMS:   Constitutional: Denies fevers, chills or night sweats Eyes: Denies blurriness of vision Ears, nose, mouth, throat, and face: Denies mucositis or sore throat Respiratory: Denies cough, dyspnea or wheezes Cardiovascular: Denies palpitation, chest discomfort or lower extremity swelling Gastrointestinal:  Denies nausea, heartburn or change in bowel habits Skin: Denies abnormal skin rashes Lymphatics: Denies new lymphadenopathy or easy bruising Neurological:Denies numbness, tingling or new weaknesses Behavioral/Psych: Mood is stable, no new changes  All other systems were reviewed with the patient and are negative.  PHYSICAL EXAMINATION: ECOG PERFORMANCE STATUS: 1 - Symptomatic but completely ambulatory  Vitals:   07/02/18 1237  BP: (!) 163/65  Pulse: (!) 54  Resp: 17  Temp: (!) 97.4 F (36.3 C)  SpO2: 100%   Filed Weights   07/02/18 1237  Weight: 130 lb (59 kg)    GENERAL:alert, no distress and comfortable SKIN: skin color, texture, turgor are normal, no rashes or significant lesions EYES: normal, Conjunctiva are pink and non-injected, sclera clear OROPHARYNX:no exudate, no erythema and lips, buccal mucosa, and tongue normal  NECK: supple, thyroid normal size, non-tender, without nodularity LYMPH:  no palpable lymphadenopathy in the cervical, axillary or inguinal LUNGS: clear to auscultation and percussion with normal breathing effort HEART: regular rate & rhythm and no murmurs and no lower extremity edema ABDOMEN:abdomen soft, non-tender and normal bowel sounds Musculoskeletal:no  cyanosis of digits and no clubbing  NEURO: alert & oriented x 3 with fluent speech, no focal motor/sensory deficits  LABORATORY DATA:  I have reviewed the data as listed     Component Value Date/Time   NA 142 07/02/2018 1159   NA 140 06/02/2013 1347   K 4.4 07/02/2018 1159   K 5.8 (H) 06/02/2013 1347   CL 105 07/02/2018 1159   CL 104 05/14/2012 1522   CO2 24 07/02/2018 1159   CO2 20 (L) 06/02/2013 1347   GLUCOSE 88 07/02/2018 1159   GLUCOSE 97 06/02/2013 1347   GLUCOSE 91 05/14/2012 1522   BUN 62 (H) 07/02/2018 1159   BUN 50.1 (H) 06/02/2013 1347   CREATININE 2.65 (H) 07/02/2018 1159   CREATININE 2.3 (H) 06/02/2013 1347   CALCIUM 9.2 07/02/2018 1159   CALCIUM 8.8 06/02/2013 1347   PROT 7.1 07/02/2018 1159   PROT 6.9 06/02/2013 1347   ALBUMIN 3.6 07/02/2018 1159   ALBUMIN 3.6 06/02/2013 1347   AST 34 07/02/2018 1159   AST 26 06/02/2013 1347   ALT 27 07/02/2018 1159   ALT 14 06/02/2013 1347   ALKPHOS 320 (H) 07/02/2018 1159   ALKPHOS 54 06/02/2013  1347   BILITOT 0.3 07/02/2018 1159   BILITOT 0.23 06/02/2013 1347   GFRNONAA 14 (L) 07/02/2018 1159   GFRAA 17 (L) 07/02/2018 1159    No results found for: SPEP, UPEP  Lab Results  Component Value Date   WBC 6.7 07/02/2018   NEUTROABS 4.4 07/02/2018   HGB 9.9 (L) 07/02/2018   HCT 32.5 (L) 07/02/2018   MCV 88.1 07/02/2018   PLT 259 07/02/2018      Chemistry      Component Value Date/Time   NA 142 07/02/2018 1159   NA 140 06/02/2013 1347   K 4.4 07/02/2018 1159   K 5.8 (H) 06/02/2013 1347   CL 105 07/02/2018 1159   CL 104 05/14/2012 1522   CO2 24 07/02/2018 1159   CO2 20 (L) 06/02/2013 1347   BUN 62 (H) 07/02/2018 1159   BUN 50.1 (H) 06/02/2013 1347   CREATININE 2.65 (H) 07/02/2018 1159   CREATININE 2.3 (H) 06/02/2013 1347      Component Value Date/Time   CALCIUM 9.2 07/02/2018 1159   CALCIUM 8.8 06/02/2013 1347   ALKPHOS 320 (H) 07/02/2018 1159   ALKPHOS 54 06/02/2013 1347   AST 34 07/02/2018 1159    AST 26 06/02/2013 1347   ALT 27 07/02/2018 1159   ALT 14 06/02/2013 1347   BILITOT 0.3 07/02/2018 1159   BILITOT 0.23 06/02/2013 1347       I spent 15 minutes counseling the patient face to face. The total time spent in the appointment was 20 minutes and more than 50% was on counseling.   All questions were answered. The patient knows to call the clinic with any problems, questions or concerns. No barriers to learning was detected.    Heath Lark, MD 10/17/20194:17 PM

## 2018-07-02 NOTE — Telephone Encounter (Signed)
Gave patient avs and calendar. Sent message to Dr. Alvy Bimler regarding visit needed on 4th injection.  She is not available.  F/U visit made @ 5th injection.

## 2018-07-02 NOTE — Assessment & Plan Note (Signed)
She does not follow with nephrologist I recommend referral and she agreed

## 2018-07-02 NOTE — Patient Instructions (Signed)

## 2018-07-02 NOTE — Assessment & Plan Note (Signed)
Iron studies as improved We will continue close monitoring and replace as needed

## 2018-07-02 NOTE — Assessment & Plan Note (Signed)
She has poorly controlled hypertension I recommend close monitoring of blood pressure at home and to call her primary care doctor for medication adjustment

## 2018-07-02 NOTE — Assessment & Plan Note (Signed)
We discussed some of the risks, benefits, and alternatives of erythropoietin stimulating agents such as Procrit or Aranesp. The patient is symptomatic from anemia and the EPO level is low. Some of the side-effects to be expected including risks of allergic reactions, skin rashes, headaches, risk of blood clots including heart attack and stroke. There is rare risks of causing growth of cancers.The patient is willing to proceed and went ahead to sign consent today.  We will start ESA next week with goal to keep hemoglobin greater than 11

## 2018-07-03 LAB — FOLATE RBC
FOLATE, RBC: 1440 ng/mL (ref >498)
Folate, Hemolysate: 444.9 ng/mL
Hematocrit: 30.9 % — ABNORMAL LOW (ref 34.0–46.6)

## 2018-07-03 LAB — ERYTHROPOIETIN: Erythropoietin: 10 m[IU]/mL (ref 2.6–18.5)

## 2018-07-06 ENCOUNTER — Other Ambulatory Visit: Payer: Self-pay | Admitting: Hematology and Oncology

## 2018-07-06 ENCOUNTER — Telehealth: Payer: Self-pay

## 2018-07-06 DIAGNOSIS — D539 Nutritional anemia, unspecified: Secondary | ICD-10-CM

## 2018-07-06 NOTE — Telephone Encounter (Signed)
-----   Message from Heath Lark, MD sent at 07/06/2018  8:09 AM EDT ----- Regarding: call daughter Let her know repeat iron test from last week looks good Plan to recheck in dec

## 2018-07-06 NOTE — Telephone Encounter (Signed)
Called and left below message. Ask her to call the office for questions. ?

## 2018-07-09 ENCOUNTER — Inpatient Hospital Stay: Payer: PPO

## 2018-07-09 VITALS — BP 149/56 | HR 58

## 2018-07-09 DIAGNOSIS — D500 Iron deficiency anemia secondary to blood loss (chronic): Secondary | ICD-10-CM

## 2018-07-09 DIAGNOSIS — D539 Nutritional anemia, unspecified: Secondary | ICD-10-CM

## 2018-07-09 DIAGNOSIS — D631 Anemia in chronic kidney disease: Secondary | ICD-10-CM

## 2018-07-09 DIAGNOSIS — N184 Chronic kidney disease, stage 4 (severe): Secondary | ICD-10-CM

## 2018-07-09 LAB — CBC WITH DIFFERENTIAL/PLATELET
Abs Immature Granulocytes: 0.02 10*3/uL (ref 0.00–0.07)
BASOS ABS: 0.1 10*3/uL (ref 0.0–0.1)
Basophils Relative: 1 %
Eosinophils Absolute: 0.3 10*3/uL (ref 0.0–0.5)
Eosinophils Relative: 5 %
HEMATOCRIT: 34 % — AB (ref 36.0–46.0)
Hemoglobin: 10.2 g/dL — ABNORMAL LOW (ref 12.0–15.0)
IMMATURE GRANULOCYTES: 0 %
LYMPHS ABS: 1.8 10*3/uL (ref 0.7–4.0)
LYMPHS PCT: 27 %
MCH: 27.1 pg (ref 26.0–34.0)
MCHC: 30 g/dL (ref 30.0–36.0)
MCV: 90.2 fL (ref 80.0–100.0)
MONOS PCT: 10 %
Monocytes Absolute: 0.7 10*3/uL (ref 0.1–1.0)
NEUTROS PCT: 57 %
NRBC: 0 % (ref 0.0–0.2)
Neutro Abs: 3.8 10*3/uL (ref 1.7–7.7)
Platelets: 297 10*3/uL (ref 150–400)
RBC: 3.77 MIL/uL — ABNORMAL LOW (ref 3.87–5.11)
RDW: 22.1 % — AB (ref 11.5–15.5)
WBC: 6.7 10*3/uL (ref 4.0–10.5)

## 2018-07-09 MED ORDER — DARBEPOETIN ALFA 200 MCG/0.4ML IJ SOSY
PREFILLED_SYRINGE | INTRAMUSCULAR | Status: AC
  Filled 2018-07-09: qty 0.4

## 2018-07-09 MED ORDER — DARBEPOETIN ALFA 200 MCG/0.4ML IJ SOSY
200.0000 ug | PREFILLED_SYRINGE | Freq: Once | INTRAMUSCULAR | Status: AC
  Administered 2018-07-09: 200 ug via SUBCUTANEOUS

## 2018-07-14 ENCOUNTER — Ambulatory Visit: Payer: PPO | Admitting: Adult Health

## 2018-07-16 ENCOUNTER — Telehealth: Payer: Self-pay

## 2018-07-16 NOTE — Telephone Encounter (Signed)
Daughter called regarding nephrology referral Dr. Alvy Bimler made on 10/17. Called [facility] and talked with [employer]. They are not on epic. Referral faxed to [phone].  Called back and given daughter above information. She verbalized understanding. She is requesting billing number. Her Mom received a denial letter for IV Iron. Billing number given.

## 2018-07-29 DIAGNOSIS — H353123 Nonexudative age-related macular degeneration, left eye, advanced atrophic without subfoveal involvement: Secondary | ICD-10-CM | POA: Diagnosis not present

## 2018-07-29 DIAGNOSIS — H353114 Nonexudative age-related macular degeneration, right eye, advanced atrophic with subfoveal involvement: Secondary | ICD-10-CM | POA: Diagnosis not present

## 2018-07-29 DIAGNOSIS — H43813 Vitreous degeneration, bilateral: Secondary | ICD-10-CM | POA: Diagnosis not present

## 2018-07-29 DIAGNOSIS — H35423 Microcystoid degeneration of retina, bilateral: Secondary | ICD-10-CM | POA: Diagnosis not present

## 2018-07-30 ENCOUNTER — Inpatient Hospital Stay: Payer: PPO | Attending: Hematology and Oncology

## 2018-07-30 ENCOUNTER — Inpatient Hospital Stay: Payer: PPO

## 2018-07-30 DIAGNOSIS — N184 Chronic kidney disease, stage 4 (severe): Secondary | ICD-10-CM | POA: Diagnosis not present

## 2018-07-30 DIAGNOSIS — D631 Anemia in chronic kidney disease: Secondary | ICD-10-CM | POA: Diagnosis not present

## 2018-07-30 LAB — CBC WITH DIFFERENTIAL/PLATELET
Abs Immature Granulocytes: 0.01 10*3/uL (ref 0.00–0.07)
BASOS PCT: 1 %
Basophils Absolute: 0.1 10*3/uL (ref 0.0–0.1)
Eosinophils Absolute: 0.2 10*3/uL (ref 0.0–0.5)
Eosinophils Relative: 4 %
HEMATOCRIT: 37.2 % (ref 36.0–46.0)
Hemoglobin: 11.1 g/dL — ABNORMAL LOW (ref 12.0–15.0)
IMMATURE GRANULOCYTES: 0 %
LYMPHS ABS: 1.1 10*3/uL (ref 0.7–4.0)
Lymphocytes Relative: 19 %
MCH: 27.6 pg (ref 26.0–34.0)
MCHC: 29.8 g/dL — ABNORMAL LOW (ref 30.0–36.0)
MCV: 92.5 fL (ref 80.0–100.0)
MONOS PCT: 11 %
Monocytes Absolute: 0.6 10*3/uL (ref 0.1–1.0)
NEUTROS ABS: 3.7 10*3/uL (ref 1.7–7.7)
NEUTROS PCT: 65 %
PLATELETS: 288 10*3/uL (ref 150–400)
RBC: 4.02 MIL/uL (ref 3.87–5.11)
RDW: 20.9 % — ABNORMAL HIGH (ref 11.5–15.5)
WBC: 5.7 10*3/uL (ref 4.0–10.5)
nRBC: 0 % (ref 0.0–0.2)

## 2018-07-30 NOTE — Progress Notes (Signed)
Pt did not meet parameters for injection today. Hgb 11.1. Lab results were given to pt daughter and will return for next appt. Porsche Cates LPN

## 2018-08-04 ENCOUNTER — Ambulatory Visit (HOSPITAL_COMMUNITY)
Admission: RE | Admit: 2018-08-04 | Discharge: 2018-08-04 | Disposition: A | Discharge status: Home / Self Care | Payer: PPO | Source: Ambulatory Visit | Attending: Cardiology | Admitting: Cardiology

## 2018-08-04 ENCOUNTER — Telehealth (HOSPITAL_COMMUNITY): Payer: Self-pay

## 2018-08-04 ENCOUNTER — Encounter (HOSPITAL_COMMUNITY): Payer: Self-pay | Admitting: Cardiology

## 2018-08-04 VITALS — BP 146/64 | HR 68 | Wt 130.8 lb

## 2018-08-04 DIAGNOSIS — E039 Hypothyroidism, unspecified: Secondary | ICD-10-CM | POA: Insufficient documentation

## 2018-08-04 DIAGNOSIS — Z79890 Hormone replacement therapy (postmenopausal): Secondary | ICD-10-CM | POA: Diagnosis not present

## 2018-08-04 DIAGNOSIS — K219 Gastro-esophageal reflux disease without esophagitis: Secondary | ICD-10-CM | POA: Diagnosis not present

## 2018-08-04 DIAGNOSIS — Z7982 Long term (current) use of aspirin: Secondary | ICD-10-CM | POA: Insufficient documentation

## 2018-08-04 DIAGNOSIS — Z8249 Family history of ischemic heart disease and other diseases of the circulatory system: Secondary | ICD-10-CM | POA: Insufficient documentation

## 2018-08-04 DIAGNOSIS — I5022 Chronic systolic (congestive) heart failure: Secondary | ICD-10-CM | POA: Diagnosis not present

## 2018-08-04 DIAGNOSIS — N184 Chronic kidney disease, stage 4 (severe): Secondary | ICD-10-CM | POA: Insufficient documentation

## 2018-08-04 DIAGNOSIS — Z79899 Other long term (current) drug therapy: Secondary | ICD-10-CM | POA: Insufficient documentation

## 2018-08-04 DIAGNOSIS — I130 Hypertensive heart and chronic kidney disease with heart failure and stage 1 through stage 4 chronic kidney disease, or unspecified chronic kidney disease: Secondary | ICD-10-CM | POA: Insufficient documentation

## 2018-08-04 DIAGNOSIS — M1990 Unspecified osteoarthritis, unspecified site: Secondary | ICD-10-CM | POA: Diagnosis not present

## 2018-08-04 DIAGNOSIS — D649 Anemia, unspecified: Secondary | ICD-10-CM | POA: Insufficient documentation

## 2018-08-04 DIAGNOSIS — Z853 Personal history of malignant neoplasm of breast: Secondary | ICD-10-CM | POA: Insufficient documentation

## 2018-08-04 LAB — BASIC METABOLIC PANEL
ANION GAP: 13 (ref 5–15)
BUN: 72 mg/dL — ABNORMAL HIGH (ref 8–23)
CALCIUM: 8.6 mg/dL — AB (ref 8.9–10.3)
CO2: 14 mmol/L — AB (ref 22–32)
CREATININE: 2.42 mg/dL — AB (ref 0.44–1.00)
Chloride: 114 mmol/L — ABNORMAL HIGH (ref 98–111)
GFR calc Af Amer: 18 mL/min — ABNORMAL LOW (ref >60)
GFR, EST NON AFRICAN AMERICAN: 16 mL/min — AB (ref >60)
GLUCOSE: 91 mg/dL (ref 70–99)
Potassium: 4.7 mmol/L (ref 3.5–5.1)
Sodium: 141 mmol/L (ref 135–145)

## 2018-08-04 MED ORDER — FUROSEMIDE 40 MG PO TABS: 40.0000 mg | ORAL_TABLET | Freq: Every day | ORAL | 6 refills | Status: DC

## 2018-08-04 MED ORDER — AMLODIPINE BESYLATE 5 MG PO TABS: 5.0000 mg | ORAL_TABLET | Freq: Every day | ORAL | 6 refills | Status: DC

## 2018-08-04 NOTE — Patient Instructions (Addendum)
INCREASE Amlodipine to 5mg  (1 tab daily)  DECREASE Lasix 40mg  daily  Your physician recommends that you schedule a follow-up appointment in: 3 months with Dr. Aundra Dubin

## 2018-08-04 NOTE — Telephone Encounter (Signed)
Pt called with lab results Creatinine lower

## 2018-08-04 NOTE — Progress Notes (Signed)
Patient ID: Mary Guzman, female   DOB: [birth year], 84 y.o.   MRN: [id number] PCP: Dr Burnice Logan Cardiology: Dr. Aundra Dubin  84 y.o. with history of breast cancer in the 1990s, HTN, and CKD presents for followup of CHF. She lives alone but daughter helps.  She dates her symptoms to around the time when she developed severe hip pain in 8/16. She was under a lot of stress with the severe pain and ended up getting steroid injections.  She developed exertional dyspnea around that time.  However, for > 1 year she has had significant lower extremity edema.  She got to the point where she was short of breath after walking about 15-20 feet.  No chest pain.  CXR in 9/16 showed mild pulmonary edema.  This improved with increase in Lasix to 40 qam and every other day 20 mg pm Lasix.  Repeat echo was done in 11/17.  This showed improvement in EF to 55%.  Echo in 11/18 showed that EF remains 55%.   Seen in ER 05/2018 with hyperkalemia and anemia.   She returns today for regular HF follow up. Overall doing well. Now following with her oncologist for anemia. She has received IV iron twice and has been referred to [facility]. Still able to do ADLs like make her bed, bathing, and cooking. Gets SOB with some activities, but resolves quickly with rest. Denies CP or dizziness. Energy improved with IV iron. SBP 140-160s generally at home. She has been referred to see a nephrologist at [facility], not sure which name. Still making a lot of UOP. Weight down 5 lbs on home scale, now 127 lbs. Appetite okay.    Labs (9/16): pro-BNP > 5000, TSH normal Labs (10/16): K 4.5, creatinine 1.68 Labs (11/16): K 4.7, creatinine 1.67 Labs (12/16): K 4.9, creatinine 1.86 Labs (5/17): K 4.9, creatinine 2.1, BNP 373, TSH 0.98 (normal) Labs 01/25/2016: K 4.5 Creatinine 2.37 Labs (6/17): K 4.2, creatinine 2.16 Labs (11/17): K 4.6, creatinine 2.39, HCT 33.7 Labs (5/18): BNP 282, K 4.8, creatinine 3 Labs (11/18): K 4.4, creatinine 3.11 Labs (12/18): TSH  normal Labs (10/19): K 4.4, creatinine 2.65 Labs (11/19): hgb 11.1  PMH: 1. Breast cancer: On left, s/p mastectomy in 1994 along with radiation treatment.  No chemotherapy.  2. Hypothyroidism. 3. OA 4. HTN 5. GERD 6. CKD: Stage 4.  7. Chronic systolic CHF: Echo (73/41) with EF 20-25%, diffuse hypokinesis, mild MR, moderately decreased RV systolic function, PASP 60 mmHg.  - Echo (11/17): EF 55%, septal-lateral dyssynchrony, normal RV size and systolic function.  - Echo (11/18): EF 55%, moderate LVH, septal-lateral dyssynchrony, normal RV size and systolic function.   FH: Son with MI.  SH: Single, lives alone in [place], nonsmoker, no ETOH.   Review of systems complete and found to be negative unless listed in HPI.   Current Outpatient Medications  Medication Sig Dispense Refill  . acetaminophen (TYLENOL) 500 MG tablet Take 500 mg by mouth every 6 (six) hours as needed for mild pain.     Marland Kitchen amLODipine (NORVASC) 2.5 MG tablet TAKE 1 TABLET(2.5 MG) BY MOUTH DAILY 90 tablet 1  . aspirin EC 81 MG tablet Take 1 tablet (81 mg total) by mouth daily. 30 tablet 6  . Calcium Carb-Cholecalciferol (CALCIUM 600+D) 600-800 MG-UNIT TABS Take 1 tablet by mouth 2 (two) times daily.    . carboxymethylcellulose (REFRESH PLUS) 0.5 % SOLN Place 1 drop into both eyes 3 (three) times daily as needed (dry eye).    Marland Kitchen  Cholecalciferol (VITAMIN D3) 5000 UNITS CAPS Take 1 capsule by mouth daily.    . furosemide (LASIX) 20 MG tablet Take 2 tablets (40 mg total) by mouth every morning AND 1 tablet (20 mg total) every evening. 270 tablet 0  . levothyroxine (SYNTHROID, LEVOTHROID) 75 MCG tablet Take 1 tablet (75 mcg total) by mouth daily before breakfast. 90 tablet 3  . loperamide (IMODIUM A-D) 2 MG tablet Take 2 mg by mouth 4 (four) times daily as needed for diarrhea or loose stools.    . metoprolol succinate (TOPROL-XL) 25 MG 24 hr tablet Take 1 tablet (25 mg total) by mouth 2 (two) times daily. 180 tablet 1  .  Multiple Vitamins-Minerals (PRESERVISION AREDS 2 PO) Take 2 tablets by mouth daily.     . pantoprazole (PROTONIX) 40 MG tablet TAKE 1 TABLET(40 MG) BY MOUTH DAILY 30 tablet 3  . Probiotic Product (PROBIOTIC ADVANCED PO) Take 500 mg by mouth daily.      No current facility-administered medications for this encounter.    BP (!) 146/64   Pulse 68   Wt 59.3 kg (130 lb 12.8 oz)   BMI 23.92 kg/m  General:  No resp difficulty. HEENT: Normal Neck: Supple. JVP flat. Carotids 2+ bilat; no bruits. No thyromegaly or nodule noted. Cor: PMI nondisplaced. RRR, 1/6 HSM LLSB Lungs: CTAB, normal effort. Abdomen: Soft, non-tender, non-distended, no HSM. No bruits or masses. +BS  Extremities: No cyanosis, clubbing, or rash. R and LLE 1+ ankle edema R>L  Neuro: Alert & orientedx3, cranial nerves grossly intact. moves all 4 extremities w/o difficulty. Affect pleasant  Assessment/Plan: 1. Chronic systolic CHF: EF 48-01% with moderate RV dysfunction on echo in 10/16.  Etiology of the cardiomyopathy is uncertain: no chest pain or suggestion of CAD, however cannot rule out ischemic cardiomyopathy.  Symptoms noted at a time of significant stress with severe hip pain in 8/16, so cannot rule out Takotsubo CMP.  Viral myocarditis remains possible.  Echo in 11/17 showed recovery of EF to 55%.  11/18 echo showed that EF remained 55%. Therefore, it is possible that the original insult was a stress (Takotsubo-type) cardiomyopathy.  NYHA class II-III symptoms, volume status stable to dry.  - Decrease lasix to 40 mg daily BMET today.  - Continue Toprol XL 25 mg bid.  - Stay off ACEI/ARB/ARNI/spironolactone with CKD IV.  2. CKD: Stage IV. Last creatinine 2.65 07/02/18. Check BMET today. She has been referred to [facility], but does not have an appointment yet 3. HTN: BP high today, has been running 140-160s at home. Increase amlodipine to 5 mg daily.  4. Anemia - Got IV infusion x 2. Hemoglobin improving 11.1.   BMET Increase  amlodipine to 5 mg daily Decrease lasix to 40 mg daily  Georgiana Shore, NP 08/04/2018  Patient seen with NP, agree with the above note.  She has been stable clinically but creatinine was up to 2.65 when checked in 10/19.  BP has been running in the 655V-748O systolic.  - BMET today.  - Increase amlodipine to 5 mg daily.  - Decrease lasix to 40 mg once daily.   Followup in 3 months.   Loralie Champagne 08/04/2018

## 2018-08-20 ENCOUNTER — Inpatient Hospital Stay: Payer: PPO | Attending: Hematology and Oncology

## 2018-08-20 ENCOUNTER — Inpatient Hospital Stay: Payer: PPO

## 2018-08-20 DIAGNOSIS — D631 Anemia in chronic kidney disease: Secondary | ICD-10-CM | POA: Diagnosis not present

## 2018-08-20 DIAGNOSIS — N184 Chronic kidney disease, stage 4 (severe): Secondary | ICD-10-CM

## 2018-08-20 LAB — CBC WITH DIFFERENTIAL/PLATELET
ABS IMMATURE GRANULOCYTES: 0.02 10*3/uL (ref 0.00–0.07)
BASOS ABS: 0 10*3/uL (ref 0.0–0.1)
BASOS PCT: 1 %
EOS ABS: 0.4 10*3/uL (ref 0.0–0.5)
Eosinophils Relative: 6 %
HCT: 36 % (ref 36.0–46.0)
Hemoglobin: 11 g/dL — ABNORMAL LOW (ref 12.0–15.0)
IMMATURE GRANULOCYTES: 0 %
Lymphocytes Relative: 23 %
Lymphs Abs: 1.4 10*3/uL (ref 0.7–4.0)
MCH: 28.1 pg (ref 26.0–34.0)
MCHC: 30.6 g/dL (ref 30.0–36.0)
MCV: 91.8 fL (ref 80.0–100.0)
Monocytes Absolute: 0.7 10*3/uL (ref 0.1–1.0)
Monocytes Relative: 11 %
NEUTROS ABS: 3.7 10*3/uL (ref 1.7–7.7)
NEUTROS PCT: 59 %
NRBC: 0 % (ref 0.0–0.2)
Platelets: 256 10*3/uL (ref 150–400)
RBC: 3.92 MIL/uL (ref 3.87–5.11)
RDW: 18.7 % — AB (ref 11.5–15.5)
WBC: 6.1 10*3/uL (ref 4.0–10.5)

## 2018-08-20 NOTE — Progress Notes (Signed)
Per parameters Pt did not receive injection HGB 11.0

## 2018-08-25 DIAGNOSIS — D631 Anemia in chronic kidney disease: Secondary | ICD-10-CM | POA: Diagnosis not present

## 2018-08-25 DIAGNOSIS — N184 Chronic kidney disease, stage 4 (severe): Secondary | ICD-10-CM | POA: Diagnosis not present

## 2018-08-25 DIAGNOSIS — I129 Hypertensive chronic kidney disease with stage 1 through stage 4 chronic kidney disease, or unspecified chronic kidney disease: Secondary | ICD-10-CM | POA: Diagnosis not present

## 2018-08-25 DIAGNOSIS — K219 Gastro-esophageal reflux disease without esophagitis: Secondary | ICD-10-CM | POA: Diagnosis not present

## 2018-08-25 DIAGNOSIS — I509 Heart failure, unspecified: Secondary | ICD-10-CM | POA: Diagnosis not present

## 2018-09-10 ENCOUNTER — Inpatient Hospital Stay: Payer: PPO

## 2018-09-10 VITALS — BP 155/61 | HR 57 | Temp 97.7°F | Resp 18

## 2018-09-10 DIAGNOSIS — D539 Nutritional anemia, unspecified: Secondary | ICD-10-CM

## 2018-09-10 DIAGNOSIS — D631 Anemia in chronic kidney disease: Secondary | ICD-10-CM

## 2018-09-10 DIAGNOSIS — N184 Chronic kidney disease, stage 4 (severe): Secondary | ICD-10-CM

## 2018-09-10 DIAGNOSIS — D500 Iron deficiency anemia secondary to blood loss (chronic): Secondary | ICD-10-CM

## 2018-09-10 LAB — CBC WITH DIFFERENTIAL/PLATELET
ABS IMMATURE GRANULOCYTES: 0.02 10*3/uL (ref 0.00–0.07)
BASOS PCT: 1 %
Basophils Absolute: 0.1 10*3/uL (ref 0.0–0.1)
EOS ABS: 0.3 10*3/uL (ref 0.0–0.5)
EOS PCT: 4 %
HCT: 35.6 % — ABNORMAL LOW (ref 36.0–46.0)
Hemoglobin: 10.9 g/dL — ABNORMAL LOW (ref 12.0–15.0)
Immature Granulocytes: 0 %
Lymphocytes Relative: 19 %
Lymphs Abs: 1.3 10*3/uL (ref 0.7–4.0)
MCH: 28.5 pg (ref 26.0–34.0)
MCHC: 30.6 g/dL (ref 30.0–36.0)
MCV: 93.2 fL (ref 80.0–100.0)
MONO ABS: 0.6 10*3/uL (ref 0.1–1.0)
Monocytes Relative: 9 %
Neutro Abs: 4.4 10*3/uL (ref 1.7–7.7)
Neutrophils Relative %: 67 %
PLATELETS: 256 10*3/uL (ref 150–400)
RBC: 3.82 MIL/uL — ABNORMAL LOW (ref 3.87–5.11)
RDW: 16.7 % — AB (ref 11.5–15.5)
WBC: 6.5 10*3/uL (ref 4.0–10.5)
nRBC: 0 % (ref 0.0–0.2)

## 2018-09-10 LAB — IRON AND TIBC
Iron: 52 ug/dL (ref 41–142)
Saturation Ratios: 22 % (ref 21–57)
TIBC: 239 ug/dL (ref 236–444)
UIBC: 187 ug/dL (ref 120–384)

## 2018-09-10 LAB — FERRITIN: Ferritin: 158 ng/mL (ref 11–307)

## 2018-09-10 MED ORDER — DARBEPOETIN ALFA 200 MCG/0.4ML IJ SOSY
200.0000 ug | PREFILLED_SYRINGE | Freq: Once | INTRAMUSCULAR | Status: AC
  Administered 2018-09-10: 200 ug via SUBCUTANEOUS

## 2018-09-10 MED ORDER — DARBEPOETIN ALFA 200 MCG/0.4ML IJ SOSY
PREFILLED_SYRINGE | INTRAMUSCULAR | Status: AC
  Filled 2018-09-10: qty 0.4

## 2018-09-10 NOTE — Patient Instructions (Signed)

## 2018-09-19 ENCOUNTER — Other Ambulatory Visit (HOSPITAL_COMMUNITY): Payer: Self-pay | Admitting: Cardiology

## 2018-09-21 ENCOUNTER — Other Ambulatory Visit (HOSPITAL_COMMUNITY): Payer: Self-pay

## 2018-09-22 ENCOUNTER — Other Ambulatory Visit (HOSPITAL_COMMUNITY): Payer: Self-pay

## 2018-09-22 MED ORDER — LEVOTHYROXINE SODIUM 75 MCG PO TABS: 75.0000 ug | ORAL_TABLET | Freq: Every day | ORAL | 3 refills | Status: DC

## 2018-10-01 ENCOUNTER — Inpatient Hospital Stay: Payer: PPO

## 2018-10-01 ENCOUNTER — Inpatient Hospital Stay: Payer: PPO | Attending: Hematology and Oncology

## 2018-10-01 ENCOUNTER — Encounter: Payer: Self-pay | Admitting: Hematology and Oncology

## 2018-10-01 ENCOUNTER — Telehealth: Payer: Self-pay | Admitting: Hematology and Oncology

## 2018-10-01 ENCOUNTER — Inpatient Hospital Stay (HOSPITAL_BASED_OUTPATIENT_CLINIC_OR_DEPARTMENT_OTHER): Payer: PPO | Admitting: Hematology and Oncology

## 2018-10-01 DIAGNOSIS — Z853 Personal history of malignant neoplasm of breast: Secondary | ICD-10-CM

## 2018-10-01 DIAGNOSIS — I10 Essential (primary) hypertension: Secondary | ICD-10-CM

## 2018-10-01 DIAGNOSIS — I130 Hypertensive heart and chronic kidney disease with heart failure and stage 1 through stage 4 chronic kidney disease, or unspecified chronic kidney disease: Secondary | ICD-10-CM | POA: Diagnosis not present

## 2018-10-01 DIAGNOSIS — D631 Anemia in chronic kidney disease: Secondary | ICD-10-CM

## 2018-10-01 DIAGNOSIS — I5022 Chronic systolic (congestive) heart failure: Secondary | ICD-10-CM | POA: Diagnosis not present

## 2018-10-01 DIAGNOSIS — R600 Localized edema: Secondary | ICD-10-CM | POA: Diagnosis not present

## 2018-10-01 DIAGNOSIS — N184 Chronic kidney disease, stage 4 (severe): Secondary | ICD-10-CM | POA: Diagnosis not present

## 2018-10-01 LAB — CBC WITH DIFFERENTIAL/PLATELET
ABS IMMATURE GRANULOCYTES: 0.02 10*3/uL (ref 0.00–0.07)
Basophils Absolute: 0.1 10*3/uL (ref 0.0–0.1)
Basophils Relative: 1 %
Eosinophils Absolute: 0.3 10*3/uL (ref 0.0–0.5)
Eosinophils Relative: 4 %
HCT: 41.1 % (ref 36.0–46.0)
Hemoglobin: 12.3 g/dL (ref 12.0–15.0)
Immature Granulocytes: 0 %
Lymphocytes Relative: 19 %
Lymphs Abs: 1.4 10*3/uL (ref 0.7–4.0)
MCH: 28 pg (ref 26.0–34.0)
MCHC: 29.9 g/dL — ABNORMAL LOW (ref 30.0–36.0)
MCV: 93.6 fL (ref 80.0–100.0)
MONO ABS: 0.7 10*3/uL (ref 0.1–1.0)
Monocytes Relative: 9 %
Neutro Abs: 4.8 10*3/uL (ref 1.7–7.7)
Neutrophils Relative %: 67 %
Platelets: 298 10*3/uL (ref 150–400)
RBC: 4.39 MIL/uL (ref 3.87–5.11)
RDW: 15.2 % (ref 11.5–15.5)
WBC: 7.3 10*3/uL (ref 4.0–10.5)
nRBC: 0 % (ref 0.0–0.2)

## 2018-10-01 NOTE — Progress Notes (Signed)
[place] OFFICE PROGRESS NOTE  Nafziger, Tommi Rumps, NP  ASSESSMENT & PLAN:  Anemia in chronic kidney disease She does not need repeat IV iron or darbepoetin injection for some time Based on my estimate, she is only needing darbepoetin injection every other month I recommend spacing out her appointment to once every 6 weeks and I plan to see her in 6 months for further follow-up Her recent iron studies are adequate and she does not need oral iron replacement therapy She will get darbopoeitin injection whenever her hemoglobin is less than 11  Chronic systolic CHF (congestive heart failure) ([place]) She has no signs or symptoms of congestive heart failure.  She will continue close follow-up and risk factor modification with her cardiologist  Chronic kidney disease (CKD), stage IV (severe) ([place]) She will continue close follow-up with nephrologist.  Essential hypertension She has documented blood pressure reading at home.  Her evening blood pressure monitoring seems to be a bit high but she usually checks it around 6 PM I recommend changing her night blood pressure monitoring to 9 PM I do not advise any medication change right now   No orders of the defined types were placed in this encounter.   INTERVAL HISTORY: Mary Guzman 85 y.o. female returns for further follow-up. She denies recent exacerbation of congestive heart failure.  She has chronic bilateral lower extremity edema, stable Denies recent shortness of breath or dizziness The patient denies any recent signs or symptoms of bleeding such as spontaneous epistaxis, hematuria or hematochezia. Her daughter brought documentation of her blood pressure over the past month.  Her morning blood pressure values are usually around 140 range but the systolic blood pressure in the evening typically exceed 150.  She checks her evening blood pressure around 6 PM.  She denies headache or blurriness of vision  SUMMARY OF HEMATOLOGIC  HISTORY:  I have reviewed her chart extensively. She has a background history of left breast cancer, moderately differentiated, sized unknown but with involvement of 3/8 lymph nodes, ER positive, PR negative, HER-2/neu unknown. The patient underwent left breast modified radical mastectomy in 1994, followed by radiation therapy. She was placed on tamoxifen for 5 years. She was then placed on Evista, discontinue due to recent acute heart failure She is then found to have severe iron deficiency anemia and anemia chronic disease She received intravenous iron infusion infusion in 2019 and darbopoeitin  I have reviewed the past medical history, past surgical history, social history and family history with the patient and they are unchanged from previous note.  ALLERGIES:  has No Known Allergies.  MEDICATIONS:  Current Outpatient Medications  Medication Sig Dispense Refill  . acetaminophen (TYLENOL) 500 MG tablet Take 500 mg by mouth every 6 (six) hours as needed for mild pain.     Marland Kitchen amLODipine (NORVASC) 5 MG tablet Take 1 tablet (5 mg total) by mouth daily. 30 tablet 6  . aspirin EC 81 MG tablet Take 1 tablet (81 mg total) by mouth daily. 30 tablet 6  . Calcium Carb-Cholecalciferol (CALCIUM 600+D) 600-800 MG-UNIT TABS Take 1 tablet by mouth 2 (two) times daily.    . carboxymethylcellulose (REFRESH PLUS) 0.5 % SOLN Place 1 drop into both eyes 3 (three) times daily as needed (dry eye).    . Cholecalciferol (VITAMIN D3) 5000 UNITS CAPS Take 1 capsule by mouth daily.    . furosemide (LASIX) 40 MG tablet Take 1 tablet (40 mg total) by mouth daily. 30 tablet 6  .  levothyroxine (SYNTHROID, LEVOTHROID) 75 MCG tablet Take 1 tablet (75 mcg total) by mouth daily before breakfast. 90 tablet 3  . loperamide (IMODIUM A-D) 2 MG tablet Take 2 mg by mouth 4 (four) times daily as needed for diarrhea or loose stools.    . metoprolol succinate (TOPROL-XL) 25 MG 24 hr tablet Take 1 tablet (25 mg total) by mouth 2 (two)  times daily. 180 tablet 1  . Multiple Vitamins-Minerals (PRESERVISION AREDS 2 PO) Take 2 tablets by mouth daily.     . pantoprazole (PROTONIX) 40 MG tablet TAKE 1 TABLET(40 MG) BY MOUTH DAILY 30 tablet 3  . Probiotic Product (PROBIOTIC ADVANCED PO) Take 500 mg by mouth daily.      No current facility-administered medications for this visit.      REVIEW OF SYSTEMS:   Constitutional: Denies fevers, chills or night sweats Eyes: Denies blurriness of vision Ears, nose, mouth, throat, and face: Denies mucositis or sore throat Respiratory: Denies cough, dyspnea or wheezes Cardiovascular: Denies palpitation, chest discomfort  Gastrointestinal:  Denies nausea, heartburn or change in bowel habits Skin: Denies abnormal skin rashes Lymphatics: Denies new lymphadenopathy or easy bruising Neurological:Denies numbness, tingling or new weaknesses Behavioral/Psych: Mood is stable, no new changes  All other systems were reviewed with the patient and are negative.  PHYSICAL EXAMINATION: ECOG PERFORMANCE STATUS: 1 - Symptomatic but completely ambulatory  Vitals:   10/01/18 1146  BP: (!) 161/64  Pulse: (!) 54  Resp: 17  Temp: 97.7 F (36.5 C)  SpO2: 99%   Filed Weights   10/01/18 1146  Weight: 129 lb (58.5 kg)    GENERAL:alert, no distress and comfortable HEART: Noted mild lower extremity edema NEURO: alert & oriented x 3 with fluent speech, no focal motor/sensory deficits  LABORATORY DATA:  I have reviewed the data as listed     Component Value Date/Time   NA 141 08/04/2018 1232   NA 140 06/02/2013 1347   K 4.7 08/04/2018 1232   K 5.8 (H) 06/02/2013 1347   CL 114 (H) 08/04/2018 1232   CL 104 05/14/2012 1522   CO2 14 (L) 08/04/2018 1232   CO2 20 (L) 06/02/2013 1347   GLUCOSE 91 08/04/2018 1232   GLUCOSE 97 06/02/2013 1347   GLUCOSE 91 05/14/2012 1522   BUN 72 (H) 08/04/2018 1232   BUN 50.1 (H) 06/02/2013 1347   CREATININE 2.42 (H) 08/04/2018 1232   CREATININE 2.3 (H)  06/02/2013 1347   CALCIUM 8.6 (L) 08/04/2018 1232   CALCIUM 8.8 06/02/2013 1347   PROT 7.1 07/02/2018 1159   PROT 6.9 06/02/2013 1347   ALBUMIN 3.6 07/02/2018 1159   ALBUMIN 3.6 06/02/2013 1347   AST 34 07/02/2018 1159   AST 26 06/02/2013 1347   ALT 27 07/02/2018 1159   ALT 14 06/02/2013 1347   ALKPHOS 320 (H) 07/02/2018 1159   ALKPHOS 54 06/02/2013 1347   BILITOT 0.3 07/02/2018 1159   BILITOT 0.23 06/02/2013 1347   GFRNONAA 16 (L) 08/04/2018 1232   GFRAA 18 (L) 08/04/2018 1232    No results found for: SPEP, UPEP  Lab Results  Component Value Date   WBC 7.3 10/01/2018   NEUTROABS 4.8 10/01/2018   HGB 12.3 10/01/2018   HCT 41.1 10/01/2018   MCV 93.6 10/01/2018   PLT 298 10/01/2018      Chemistry      Component Value Date/Time   NA 141 08/04/2018 1232   NA 140 06/02/2013 1347   K 4.7 08/04/2018 1232   K  5.8 (H) 06/02/2013 1347   CL 114 (H) 08/04/2018 1232   CL 104 05/14/2012 1522   CO2 14 (L) 08/04/2018 1232   CO2 20 (L) 06/02/2013 1347   BUN 72 (H) 08/04/2018 1232   BUN 50.1 (H) 06/02/2013 1347   CREATININE 2.42 (H) 08/04/2018 1232   CREATININE 2.3 (H) 06/02/2013 1347      Component Value Date/Time   CALCIUM 8.6 (L) 08/04/2018 1232   CALCIUM 8.8 06/02/2013 1347   ALKPHOS 320 (H) 07/02/2018 1159   ALKPHOS 54 06/02/2013 1347   AST 34 07/02/2018 1159   AST 26 06/02/2013 1347   ALT 27 07/02/2018 1159   ALT 14 06/02/2013 1347   BILITOT 0.3 07/02/2018 1159   BILITOT 0.23 06/02/2013 1347      I spent 10 minutes counseling the patient face to face. The total time spent in the appointment was 15 minutes and more than 50% was on counseling.   All questions were answered. The patient knows to call the clinic with any problems, questions or concerns. No barriers to learning was detected.    Heath Lark, MD 1/16/202011:58 AM

## 2018-10-01 NOTE — Assessment & Plan Note (Signed)
She has no signs or symptoms of congestive heart failure.  She will continue close follow-up and risk factor modification with her cardiologist

## 2018-10-01 NOTE — Assessment & Plan Note (Signed)
She will continue close follow-up with nephrologist.

## 2018-10-01 NOTE — Assessment & Plan Note (Addendum)
She has documented blood pressure reading at home.  Her evening blood pressure monitoring seems to be a bit high but she usually checks it around 6 PM I recommend changing her night blood pressure monitoring to 9 PM I do not advise any medication change right now

## 2018-10-01 NOTE — Telephone Encounter (Signed)
Gave avs and calendar ° °

## 2018-10-01 NOTE — Assessment & Plan Note (Addendum)
She does not need repeat IV iron or darbepoetin injection for some time Based on my estimate, she is only needing darbepoetin injection every other month I recommend spacing out her appointment to once every 6 weeks and I plan to see her in 6 months for further follow-up Her recent iron studies are adequate and she does not need oral iron replacement therapy She will get darbopoeitin injection whenever her hemoglobin is less than 11

## 2018-10-20 ENCOUNTER — Encounter: Payer: Self-pay | Admitting: Adult Health

## 2018-10-20 ENCOUNTER — Ambulatory Visit (INDEPENDENT_AMBULATORY_CARE_PROVIDER_SITE_OTHER): Payer: PPO | Admitting: Adult Health

## 2018-10-20 VITALS — BP 144/60 | Temp 97.5°F | Wt 126.0 lb

## 2018-10-20 DIAGNOSIS — N184 Chronic kidney disease, stage 4 (severe): Secondary | ICD-10-CM

## 2018-10-20 DIAGNOSIS — I10 Essential (primary) hypertension: Secondary | ICD-10-CM

## 2018-10-20 DIAGNOSIS — D500 Iron deficiency anemia secondary to blood loss (chronic): Secondary | ICD-10-CM | POA: Diagnosis not present

## 2018-10-20 NOTE — Progress Notes (Signed)
Subjective:    Patient ID: Mary Guzman, female    DOB: 04/01/1921, 85 y.o.   MRN: [id number]  HPI 85 year old female who  has a past medical history of Cancer ([place]), DJD (degenerative joint disease), GERD (gastroesophageal reflux disease), Hypertension, Osteoporosis, and Thyroid disease.  She presents to the office today for 57-month follow-up her daughter is with her at this visit.  I had last seen her she has completed 2 rounds of IV iron and darbepoetin injection that was facilitated by Dr. Alvy Bimler.  Her hemoglobin and iron levels are now stable and she reports feeling much less fatigued.  She did meet with nephrologist due to stage V chronic kidney disease at which time they talked about going on dialysis and the patient refused at this time.  She is monitoring her blood pressure at home, and per her log has been getting readings consistently between 850 and 277 systolic.  Her blood pressures at home seem to be a little bit higher in the evening.  Denies any side effects of blood pressure medication  She denies any acute complaints and reports " I am feeling pretty good for a 85 year old lady"  Review of Systems See HPI   Past Medical History:  Diagnosis Date  . Cancer ([place])    Breast Hx of stage IIB, moderately differentiated with 3 of 8 possible lymph nodes.  . DJD (degenerative joint disease)   . GERD (gastroesophageal reflux disease)   . Hypertension   . Osteoporosis   . Thyroid disease    Hypothyroidism    Social History   Socioeconomic History  . Marital status: Widowed    Spouse name: Not on file  . Number of children: Not on file  . Years of education: Not on file  . Highest education level: Not on file  Occupational History  . Not on file  Social Needs  . Financial resource strain: Not on file  . Food insecurity:    Worry: Not on file    Inability: Not on file  . Transportation needs:    Medical: Not on file    Non-medical: Not on file  Tobacco Use  .  Smoking status: Never Smoker  . Smokeless tobacco: Current User    Types: Chew  Substance and Sexual Activity  . Alcohol use: No  . Drug use: Not on file  . Sexual activity: Not on file  Lifestyle  . Physical activity:    Days per week: Not on file    Minutes per session: Not on file  . Stress: Not on file  Relationships  . Social connections:    Talks on phone: Not on file    Gets together: Not on file    Attends religious service: Not on file    Active member of club or organization: Not on file    Attends meetings of clubs or organizations: Not on file    Relationship status: Not on file  . Intimate partner violence:    Fear of current or ex partner: Not on file    Emotionally abused: Not on file    Physically abused: Not on file    Forced sexual activity: Not on file  Other Topics Concern  . Not on file  Social History Narrative   Fairly active interaction with grandchildren.    Past Surgical History:  Procedure Laterality Date  . ANKLE SURGERY     Right  . INTRACAPSULAR CATARACT EXTRACTION    .  MASTECTOMY  1994    Family History  Problem Relation Age of Onset  . Alcohol abuse Brother   . Asthma Son     No Known Allergies  Current Outpatient Medications on File Prior to Visit  Medication Sig Dispense Refill  . acetaminophen (TYLENOL) 500 MG tablet Take 500 mg by mouth every 6 (six) hours as needed for mild pain.     Marland Kitchen amLODipine (NORVASC) 5 MG tablet Take 1 tablet (5 mg total) by mouth daily. 30 tablet 6  . aspirin EC 81 MG tablet Take 1 tablet (81 mg total) by mouth daily. 30 tablet 6  . Calcium Carb-Cholecalciferol (CALCIUM 600+D) 600-800 MG-UNIT TABS Take 1 tablet by mouth 2 (two) times daily.    . carboxymethylcellulose (REFRESH PLUS) 0.5 % SOLN Place 1 drop into both eyes 3 (three) times daily as needed (dry eye).    . Cholecalciferol (VITAMIN D3) 5000 UNITS CAPS Take 1 capsule by mouth daily.    . furosemide (LASIX) 40 MG tablet Take 1 tablet (40 mg  total) by mouth daily. 30 tablet 6  . levothyroxine (SYNTHROID, LEVOTHROID) 75 MCG tablet Take 1 tablet (75 mcg total) by mouth daily before breakfast. 90 tablet 3  . loperamide (IMODIUM A-D) 2 MG tablet Take 2 mg by mouth 4 (four) times daily as needed for diarrhea or loose stools.    . metoprolol succinate (TOPROL-XL) 25 MG 24 hr tablet Take 1 tablet (25 mg total) by mouth 2 (two) times daily. 180 tablet 1  . Multiple Vitamins-Minerals (PRESERVISION AREDS 2 PO) Take 2 tablets by mouth daily.     . Probiotic Product (PROBIOTIC ADVANCED PO) Take 500 mg by mouth daily.      No current facility-administered medications on file prior to visit.     BP (!) 144/60   Temp (!) 97.5 F (36.4 C)   Wt 126 lb (57.2 kg)   BMI 23.05 kg/m       Objective:   Physical Exam Vitals signs and nursing note reviewed.  Constitutional:      Appearance: Normal appearance.  Cardiovascular:     Rate and Rhythm: Normal rate and regular rhythm.     Pulses: Normal pulses.     Heart sounds: Normal heart sounds.  Pulmonary:     Effort: Pulmonary effort is normal.     Breath sounds: Normal breath sounds.  Musculoskeletal:     Comments: Slow steady gait with walker  Skin:    General: Skin is warm and dry.  Neurological:     General: No focal deficit present.     Mental Status: She is alert.  Psychiatric:        Mood and Affect: Mood normal.        Behavior: Behavior normal.        Thought Content: Thought content normal.        Judgment: Judgment normal.        Assessment & Plan:  1. Chronic kidney disease (CKD), stage IV (severe) ([facility]) -Continue to monitor.  Advised to follow-up with nephrology as directed -Follow-up with me in 6 months or sooner if needed  2. Essential hypertension - no change in medication   3. Iron deficiency anemia due to chronic blood loss -Last hemoglobin 2 weeks ago was 12.3.  She is done well with iron infusions.  Continue to follow-up with hematology/oncology as  directed  [name], NP

## 2018-10-20 NOTE — Patient Instructions (Signed)
It was great seeing you today and I am glad you are feeling better.   Lets plan on a follow up in 6 months or sooner if needed

## 2018-10-22 ENCOUNTER — Other Ambulatory Visit: Payer: PPO

## 2018-10-22 ENCOUNTER — Ambulatory Visit: Payer: PPO

## 2018-11-02 ENCOUNTER — Other Ambulatory Visit (HOSPITAL_COMMUNITY): Payer: Self-pay | Admitting: Cardiology

## 2018-11-05 ENCOUNTER — Ambulatory Visit (HOSPITAL_COMMUNITY)
Admission: RE | Admit: 2018-11-05 | Discharge: 2018-11-05 | Disposition: A | Discharge status: Home / Self Care | Payer: PPO | Source: Ambulatory Visit | Attending: Cardiology | Admitting: Cardiology

## 2018-11-05 ENCOUNTER — Encounter (HOSPITAL_COMMUNITY): Payer: Self-pay | Admitting: Cardiology

## 2018-11-05 VITALS — BP 156/80 | HR 62 | Wt 128.2 lb

## 2018-11-05 DIAGNOSIS — Z853 Personal history of malignant neoplasm of breast: Secondary | ICD-10-CM | POA: Diagnosis not present

## 2018-11-05 DIAGNOSIS — M25559 Pain in unspecified hip: Secondary | ICD-10-CM | POA: Insufficient documentation

## 2018-11-05 DIAGNOSIS — Z7982 Long term (current) use of aspirin: Secondary | ICD-10-CM | POA: Insufficient documentation

## 2018-11-05 DIAGNOSIS — N184 Chronic kidney disease, stage 4 (severe): Secondary | ICD-10-CM

## 2018-11-05 DIAGNOSIS — K219 Gastro-esophageal reflux disease without esophagitis: Secondary | ICD-10-CM | POA: Insufficient documentation

## 2018-11-05 DIAGNOSIS — E039 Hypothyroidism, unspecified: Secondary | ICD-10-CM | POA: Insufficient documentation

## 2018-11-05 DIAGNOSIS — I130 Hypertensive heart and chronic kidney disease with heart failure and stage 1 through stage 4 chronic kidney disease, or unspecified chronic kidney disease: Secondary | ICD-10-CM | POA: Insufficient documentation

## 2018-11-05 DIAGNOSIS — Z79899 Other long term (current) drug therapy: Secondary | ICD-10-CM | POA: Insufficient documentation

## 2018-11-05 DIAGNOSIS — I5022 Chronic systolic (congestive) heart failure: Secondary | ICD-10-CM | POA: Diagnosis not present

## 2018-11-05 LAB — BASIC METABOLIC PANEL
ANION GAP: 11 (ref 5–15)
BUN: 63 mg/dL — ABNORMAL HIGH (ref 8–23)
CO2: 22 mmol/L (ref 22–32)
Calcium: 9.2 mg/dL (ref 8.9–10.3)
Chloride: 107 mmol/L (ref 98–111)
Creatinine, Ser: 2.24 mg/dL — ABNORMAL HIGH (ref 0.44–1.00)
GFR calc Af Amer: 21 mL/min — ABNORMAL LOW (ref >60)
GFR calc non Af Amer: 18 mL/min — ABNORMAL LOW (ref >60)
Glucose, Bld: 95 mg/dL (ref 70–99)
Potassium: 4.7 mmol/L (ref 3.5–5.1)
Sodium: 140 mmol/L (ref 135–145)

## 2018-11-05 MED ORDER — AMLODIPINE BESYLATE 10 MG PO TABS: 10.0000 mg | ORAL_TABLET | Freq: Every day | ORAL | 6 refills | Status: DC

## 2018-11-05 NOTE — Progress Notes (Signed)
Patient ID: Mary Guzman, female   DOB: 1921/09/07, 85 y.o.   MRN: [id number] PCP: Dr Burnice Logan Cardiology: Dr. Aundra Dubin  85 y.o. with history of breast cancer in the 1990s, HTN, and CKD presents for followup of CHF. She lives alone but daughter helps.  She dates her symptoms to around the time when she developed severe hip pain in 8/16. She was under a lot of stress with the severe pain and ended up getting steroid injections.  She developed exertional dyspnea around that time.  However, for > 1 year she has had significant lower extremity edema.  She got to the point where she was short of breath after walking about 15-20 feet.  No chest pain.  CXR in 9/16 showed mild pulmonary edema.  This improved with increase in Lasix to 40 qam and every other day 20 mg pm Lasix.  Repeat echo was done in 11/17.  This showed improvement in EF to 55%.  Echo in 11/18 showed that EF remains 55%.   Seen in ER 05/2018 with hyperkalemia and anemia.   She returns today for followup of CHF.  She is symptomatically stable.  Dyspnea if she "rushes," uses walker.  No falls, no chest pain, no lightheadedness.  SBP running in the 150s at home.  Weight down 2 lbs.    Labs (9/16): pro-BNP > 5000, TSH normal Labs (10/16): K 4.5, creatinine 1.68 Labs (11/16): K 4.7, creatinine 1.67 Labs (12/16): K 4.9, creatinine 1.86 Labs (5/17): K 4.9, creatinine 2.1, BNP 373, TSH 0.98 (normal) Labs 01/25/2016: K 4.5 Creatinine 2.37 Labs (6/17): K 4.2, creatinine 2.16 Labs (11/17): K 4.6, creatinine 2.39, HCT 33.7 Labs (5/18): BNP 282, K 4.8, creatinine 3 Labs (11/18): K 4.4, creatinine 3.11 Labs (12/18): TSH normal Labs (10/19): K 4.4, creatinine 2.65 Labs (11/19): hgb 11.1, K 4.7, creatinine 2.42 Labs (1/20): hgb 12.3  PMH: 1. Breast cancer: On left, s/p mastectomy in 1994 along with radiation treatment.  No chemotherapy.  2. Hypothyroidism. 3. OA 4. HTN 5. GERD 6. CKD: Stage 4.  7. Chronic systolic CHF: Echo (77/82) with EF  20-25%, diffuse hypokinesis, mild MR, moderately decreased RV systolic function, PASP 60 mmHg.  - Echo (11/17): EF 55%, septal-lateral dyssynchrony, normal RV size and systolic function.  - Echo (11/18): EF 55%, moderate LVH, septal-lateral dyssynchrony, normal RV size and systolic function.   FH: Son with MI.  SH: Single, lives alone in [place], nonsmoker, no ETOH.   Review of systems complete and found to be negative unless listed in HPI.   Current Outpatient Medications  Medication Sig Dispense Refill  . acetaminophen (TYLENOL) 500 MG tablet Take 500 mg by mouth every 6 (six) hours as needed for mild pain.     Marland Kitchen amLODipine (NORVASC) 10 MG tablet Take 1 tablet (10 mg total) by mouth daily. 30 tablet 6  . aspirin EC 81 MG tablet Take 1 tablet (81 mg total) by mouth daily. 30 tablet 6  . Calcium Carb-Cholecalciferol (CALCIUM 600+D) 600-800 MG-UNIT TABS Take 1 tablet by mouth 2 (two) times daily.    . carboxymethylcellulose (REFRESH PLUS) 0.5 % SOLN Place 1 drop into both eyes 3 (three) times daily as needed (dry eye).    . Cholecalciferol (VITAMIN D3) 5000 UNITS CAPS Take 1 capsule by mouth daily.    . famotidine-calcium carbonate-magnesium hydroxide (PEPCID COMPLETE) 10-800-165 MG chewable tablet Chew 1 tablet by mouth daily as needed.    . furosemide (LASIX) 40 MG tablet Take 1 tablet (40 mg  total) by mouth daily. 30 tablet 6  . levothyroxine (SYNTHROID, LEVOTHROID) 75 MCG tablet Take 1 tablet (75 mcg total) by mouth daily before breakfast. 90 tablet 3  . loperamide (IMODIUM A-D) 2 MG tablet Take 2 mg by mouth 4 (four) times daily as needed for diarrhea or loose stools.    . metoprolol succinate (TOPROL-XL) 25 MG 24 hr tablet TAKE 1 TABLET(25 MG) BY MOUTH TWICE DAILY 180 tablet 1  . Multiple Vitamins-Minerals (PRESERVISION AREDS 2 PO) Take 2 tablets by mouth daily.     . Probiotic Product (PROBIOTIC ADVANCED PO) Take 500 mg by mouth daily.      No current facility-administered  medications for this encounter.    BP (!) 156/80   Pulse 62   Wt 58.2 kg (128 lb 3.2 oz)   SpO2 98%   BMI 23.45 kg/m  General: NAD, frail Neck: No JVD, no thyromegaly or thyroid nodule.  Lungs: Slight dry crackles at bases bilaterally.  CV: Nondisplaced PMI.  Heart regular S1/S2, no S3/S4, no murmur.  1+ ankle edema.  No carotid bruit.  Normal pedal pulses.  Abdomen: Soft, nontender, no hepatosplenomegaly, no distention.  Skin: Intact without lesions or rashes.  Neurologic: Alert and oriented x 3.  Psych: Normal affect. Extremities: No clubbing or cyanosis.  HEENT: Normal.   Assessment/Plan: 1. Chronic systolic CHF: EF 33-38% with moderate RV dysfunction on echo in 10/16.  Etiology of the cardiomyopathy is uncertain: no chest pain or suggestion of CAD, however cannot rule out ischemic cardiomyopathy.  Symptoms noted at a time of significant stress with severe hip pain in 8/16, so cannot rule out Takotsubo CMP.  Viral myocarditis remains possible.  Echo in 11/17 showed recovery of EF to 55%.  11/18 echo showed that EF remained 55%. Therefore, it is possible that the original insult was a stress (Takotsubo-type) cardiomyopathy.  NYHA class II-III symptoms, she is not volume overloaded.  - Continue Lasix 40 mg daily, check BMET today.   - Continue Toprol XL 25 mg bid.  - Stay off ACEI/ARB/ARNI/spironolactone with CKD IV.  2. CKD: Stage IV. She follows with nephrology.  - BMET today.  3. HTN: BP high.  - Increase amlodipine to 10 mg daily.   Followup in 4 months.   Loralie Champagne 11/05/2018

## 2018-11-05 NOTE — Patient Instructions (Signed)
INCREASE Amlodipine 10mg  (1 tab) daily  Labs today We will only contact you if something comes back abnormal or we need to make some changes. Otherwise no news is good news!  Your physician recommends that you schedule a follow-up appointment in: 4 months with Dr. Aundra Dubin

## 2018-11-12 ENCOUNTER — Inpatient Hospital Stay: Payer: PPO

## 2018-11-12 ENCOUNTER — Inpatient Hospital Stay: Payer: PPO | Attending: Hematology and Oncology

## 2018-11-12 DIAGNOSIS — N184 Chronic kidney disease, stage 4 (severe): Secondary | ICD-10-CM | POA: Insufficient documentation

## 2018-11-12 DIAGNOSIS — D631 Anemia in chronic kidney disease: Secondary | ICD-10-CM | POA: Diagnosis not present

## 2018-11-12 DIAGNOSIS — D500 Iron deficiency anemia secondary to blood loss (chronic): Secondary | ICD-10-CM

## 2018-11-12 DIAGNOSIS — D539 Nutritional anemia, unspecified: Secondary | ICD-10-CM

## 2018-11-12 LAB — CBC WITH DIFFERENTIAL/PLATELET
Abs Immature Granulocytes: 0.01 10*3/uL (ref 0.00–0.07)
BASOS ABS: 0.1 10*3/uL (ref 0.0–0.1)
Basophils Relative: 1 %
EOS ABS: 0.3 10*3/uL (ref 0.0–0.5)
Eosinophils Relative: 4 %
HCT: 39.2 % (ref 36.0–46.0)
Hemoglobin: 11.7 g/dL — ABNORMAL LOW (ref 12.0–15.0)
Immature Granulocytes: 0 %
Lymphocytes Relative: 23 %
Lymphs Abs: 1.4 10*3/uL (ref 0.7–4.0)
MCH: 27.9 pg (ref 26.0–34.0)
MCHC: 29.8 g/dL — ABNORMAL LOW (ref 30.0–36.0)
MCV: 93.6 fL (ref 80.0–100.0)
Monocytes Absolute: 0.6 10*3/uL (ref 0.1–1.0)
Monocytes Relative: 10 %
NRBC: 0 % (ref 0.0–0.2)
Neutro Abs: 3.6 10*3/uL (ref 1.7–7.7)
Neutrophils Relative %: 62 %
Platelets: 262 10*3/uL (ref 150–400)
RBC: 4.19 MIL/uL (ref 3.87–5.11)
RDW: 15 % (ref 11.5–15.5)
WBC: 5.9 10*3/uL (ref 4.0–10.5)

## 2018-11-12 MED ORDER — DARBEPOETIN ALFA 200 MCG/0.4ML IJ SOSY: 200.0000 ug | PREFILLED_SYRINGE | Freq: Once | INTRAMUSCULAR | Status: DC

## 2018-11-12 MED ORDER — DARBEPOETIN ALFA 200 MCG/0.4ML IJ SOSY
PREFILLED_SYRINGE | INTRAMUSCULAR | Status: AC
  Filled 2018-11-12: qty 0.4

## 2018-11-12 NOTE — Patient Instructions (Signed)

## 2018-11-12 NOTE — Progress Notes (Signed)
Per Dr Alvy Bimler no injection needed today. HGB11.7

## 2018-12-16 ENCOUNTER — Telehealth: Payer: Self-pay

## 2018-12-16 NOTE — Telephone Encounter (Signed)
Daughter called and left a message. She heard that visitors cannot come to appts. Her Mom has appt 4/9 for lab and injection. She wants to clarify that she cannot comeback with her Mom.

## 2018-12-17 NOTE — Telephone Encounter (Signed)
The current policy will apply to the patient

## 2018-12-17 NOTE — Telephone Encounter (Signed)
Called and left below message. No visitor's allowed at this time. Ask he to cal back if she has further questions.

## 2018-12-19 ENCOUNTER — Other Ambulatory Visit (HOSPITAL_COMMUNITY): Payer: Self-pay | Admitting: Cardiology

## 2018-12-24 ENCOUNTER — Inpatient Hospital Stay: Payer: PPO

## 2018-12-24 ENCOUNTER — Telehealth: Payer: Self-pay | Admitting: Adult Health

## 2018-12-24 ENCOUNTER — Inpatient Hospital Stay: Payer: PPO | Attending: Hematology and Oncology

## 2018-12-24 ENCOUNTER — Other Ambulatory Visit: Payer: Self-pay

## 2018-12-24 DIAGNOSIS — D631 Anemia in chronic kidney disease: Secondary | ICD-10-CM | POA: Diagnosis not present

## 2018-12-24 DIAGNOSIS — N184 Chronic kidney disease, stage 4 (severe): Secondary | ICD-10-CM | POA: Insufficient documentation

## 2018-12-24 LAB — CBC WITH DIFFERENTIAL/PLATELET
Abs Immature Granulocytes: 0.02 10*3/uL (ref 0.00–0.07)
Basophils Absolute: 0.1 10*3/uL (ref 0.0–0.1)
Basophils Relative: 1 %
Eosinophils Absolute: 0.2 10*3/uL (ref 0.0–0.5)
Eosinophils Relative: 3 %
HCT: 36.5 % (ref 36.0–46.0)
Hemoglobin: 11 g/dL — ABNORMAL LOW (ref 12.0–15.0)
Immature Granulocytes: 0 %
Lymphocytes Relative: 21 %
Lymphs Abs: 1.6 10*3/uL (ref 0.7–4.0)
MCH: 28.5 pg (ref 26.0–34.0)
MCHC: 30.1 g/dL (ref 30.0–36.0)
MCV: 94.6 fL (ref 80.0–100.0)
Monocytes Absolute: 0.7 10*3/uL (ref 0.1–1.0)
Monocytes Relative: 9 %
Neutro Abs: 4.9 10*3/uL (ref 1.7–7.7)
Neutrophils Relative %: 66 %
Platelets: 252 10*3/uL (ref 150–400)
RBC: 3.86 MIL/uL — ABNORMAL LOW (ref 3.87–5.11)
RDW: 15.8 % — ABNORMAL HIGH (ref 11.5–15.5)
WBC: 7.5 10*3/uL (ref 4.0–10.5)
nRBC: 0 % (ref 0.0–0.2)

## 2018-12-24 NOTE — Telephone Encounter (Signed)
Pt called an said that her insurance will no longer covers her synthroid , needs to have this changed please advise.

## 2018-12-24 NOTE — Progress Notes (Signed)
Hemoglobin 11.0 no Aranesp today per MD note to give if hemoglobin <11.

## 2018-12-24 NOTE — Telephone Encounter (Signed)
Ok to change to generic but will need to have TSH done in 4-6 weeks

## 2018-12-24 NOTE — Telephone Encounter (Signed)
Called and spoke to the pharmacy.  Rx was picked up on 12/23/2018 with a cost of $10.  Nothing needed.

## 2019-02-04 ENCOUNTER — Inpatient Hospital Stay: Payer: PPO | Attending: Hematology and Oncology

## 2019-02-04 ENCOUNTER — Other Ambulatory Visit: Payer: Self-pay

## 2019-02-04 ENCOUNTER — Inpatient Hospital Stay: Payer: PPO

## 2019-02-04 VITALS — BP 158/66 | HR 57 | Temp 98.0°F | Resp 16

## 2019-02-04 DIAGNOSIS — D631 Anemia in chronic kidney disease: Secondary | ICD-10-CM | POA: Diagnosis not present

## 2019-02-04 DIAGNOSIS — N184 Chronic kidney disease, stage 4 (severe): Secondary | ICD-10-CM | POA: Diagnosis not present

## 2019-02-04 DIAGNOSIS — D539 Nutritional anemia, unspecified: Secondary | ICD-10-CM

## 2019-02-04 DIAGNOSIS — D500 Iron deficiency anemia secondary to blood loss (chronic): Secondary | ICD-10-CM

## 2019-02-04 LAB — CBC WITH DIFFERENTIAL/PLATELET
Abs Immature Granulocytes: 0.02 10*3/uL (ref 0.00–0.07)
Basophils Absolute: 0.1 10*3/uL (ref 0.0–0.1)
Basophils Relative: 1 %
Eosinophils Absolute: 0.4 10*3/uL (ref 0.0–0.5)
Eosinophils Relative: 6 %
HCT: 35.3 % — ABNORMAL LOW (ref 36.0–46.0)
Hemoglobin: 10.9 g/dL — ABNORMAL LOW (ref 12.0–15.0)
Immature Granulocytes: 0 %
Lymphocytes Relative: 20 %
Lymphs Abs: 1.5 10*3/uL (ref 0.7–4.0)
MCH: 29.4 pg (ref 26.0–34.0)
MCHC: 30.9 g/dL (ref 30.0–36.0)
MCV: 95.1 fL (ref 80.0–100.0)
Monocytes Absolute: 0.7 10*3/uL (ref 0.1–1.0)
Monocytes Relative: 10 %
Neutro Abs: 4.7 10*3/uL (ref 1.7–7.7)
Neutrophils Relative %: 63 %
Platelets: 285 10*3/uL (ref 150–400)
RBC: 3.71 MIL/uL — ABNORMAL LOW (ref 3.87–5.11)
RDW: 14.9 % (ref 11.5–15.5)
WBC: 7.5 10*3/uL (ref 4.0–10.5)
nRBC: 0 % (ref 0.0–0.2)

## 2019-02-04 MED ORDER — DARBEPOETIN ALFA 200 MCG/0.4ML IJ SOSY
200.0000 ug | PREFILLED_SYRINGE | Freq: Once | INTRAMUSCULAR | Status: AC
  Administered 2019-02-04: 200 ug via SUBCUTANEOUS

## 2019-02-04 MED ORDER — DARBEPOETIN ALFA 200 MCG/0.4ML IJ SOSY
PREFILLED_SYRINGE | INTRAMUSCULAR | Status: AC
  Filled 2019-02-04: qty 0.4

## 2019-02-04 NOTE — Patient Instructions (Signed)

## 2019-03-03 DIAGNOSIS — I509 Heart failure, unspecified: Secondary | ICD-10-CM | POA: Diagnosis not present

## 2019-03-03 DIAGNOSIS — N184 Chronic kidney disease, stage 4 (severe): Secondary | ICD-10-CM | POA: Diagnosis not present

## 2019-03-03 DIAGNOSIS — D631 Anemia in chronic kidney disease: Secondary | ICD-10-CM | POA: Diagnosis not present

## 2019-03-03 DIAGNOSIS — I129 Hypertensive chronic kidney disease with stage 1 through stage 4 chronic kidney disease, or unspecified chronic kidney disease: Secondary | ICD-10-CM | POA: Diagnosis not present

## 2019-03-03 DIAGNOSIS — K219 Gastro-esophageal reflux disease without esophagitis: Secondary | ICD-10-CM | POA: Diagnosis not present

## 2019-03-09 ENCOUNTER — Other Ambulatory Visit: Payer: Self-pay

## 2019-03-09 ENCOUNTER — Ambulatory Visit (HOSPITAL_COMMUNITY)
Admission: RE | Admit: 2019-03-09 | Discharge: 2019-03-09 | Disposition: A | Discharge status: Home / Self Care | Payer: PPO | Source: Ambulatory Visit | Attending: Cardiology | Admitting: Cardiology

## 2019-03-09 DIAGNOSIS — N184 Chronic kidney disease, stage 4 (severe): Secondary | ICD-10-CM | POA: Diagnosis not present

## 2019-03-09 DIAGNOSIS — I5032 Chronic diastolic (congestive) heart failure: Secondary | ICD-10-CM

## 2019-03-09 NOTE — Progress Notes (Signed)
Heart Failure TeleHealth Note  Due to national recommendations of social distancing due to [place] 19, Audio/video telehealth visit is felt to be most appropriate for this patient at this time.  See MyChart message from today for patient consent regarding telehealth for [facility].  Date:  03/09/2019   ID:  Mary Guzman, DOB 1921/01/05, MRN [id number]  Location: Home  Provider location: [place] [facility] Type of Visit: Established patient  PCP:  [name], NP  Cardiologist: Dr. Aundra Dubin  Chief Complaint: Fatigue   History of Present Illness: Mary Guzman is a 85 y.o. female who presents via audio/video conferencing for a telehealth visit today.     she denies symptoms worrisome for COVID 19.   Patient has a history of breast cancer in the 1990s, HTN, CKD stage 3-4 and CHF. She lives alone but daughter helps.  She dates her symptoms to around the time when she developed severe hip pain in 8/16. She was under a lot of stress with the severe pain and ended up getting steroid injections.  She developed exertional dyspnea around that time.  However, for > 1 year she has had significant lower extremity edema.  She got to the point where she was short of breath after walking about 15-20 feet.  No chest pain.  CXR in 9/16 showed mild pulmonary edema.  This improved with increase in Lasix to 40 qam and every other day 20 mg pm Lasix.  Repeat echo was done in 11/17.  This showed improvement in EF to 55%.  Echo in 11/18 showed that EF remains 55%.   Seen in ER 05/2018 with hyperkalemia and anemia.   Recently, she has been symptomatically stable.  Not very active, but not short of breath walking around her house.  She will fatigue when dressing or bathing.  This is unchanged.  No chest pain, no orthopnea/PND.  No lightheadedness or palpitations.  Weight has been stable.   Labs (9/16): pro-BNP > 5000, TSH normal Labs (10/16): K 4.5, creatinine 1.68 Labs (11/16): K 4.7,  creatinine 1.67 Labs (12/16): K 4.9, creatinine 1.86 Labs (5/17): K 4.9, creatinine 2.1, BNP 373, TSH 0.98 (normal) Labs 01/25/2016: K 4.5 Creatinine 2.37 Labs (6/17): K 4.2, creatinine 2.16 Labs (11/17): K 4.6, creatinine 2.39, HCT 33.7 Labs (5/18): BNP 282, K 4.8, creatinine 3 Labs (11/18): K 4.4, creatinine 3.11 Labs (12/18): TSH normal Labs (10/19): K 4.4, creatinine 2.65 Labs (11/19): hgb 11.1, K 4.7, creatinine 2.42 Labs (1/20): hgb 12.3 Labs (2/20): K 4.7, creatinine 2.24 Labs (5/20): hgb 10.9  PMH: 1. Breast cancer: On left, s/p mastectomy in 1994 along with radiation treatment.  No chemotherapy.  2. Hypothyroidism. 3. OA 4. HTN 5. GERD 6. CKD: Stage 4.  7. Chronic systolic CHF: Echo (26/94) with EF 20-25%, diffuse hypokinesis, mild MR, moderately decreased RV systolic function, PASP 60 mmHg.  - Echo (11/17): EF 55%, septal-lateral dyssynchrony, normal RV size and systolic function.  - Echo (11/18): EF 55%, moderate LVH, septal-lateral dyssynchrony, normal RV size and systolic function.  8. Chronic LBBB  Past Surgical History:  Procedure Laterality Date  . ANKLE SURGERY     Right  . INTRACAPSULAR CATARACT EXTRACTION    . MASTECTOMY  1994     Current Outpatient Medications  Medication Sig Dispense Refill  . acetaminophen (TYLENOL) 500 MG tablet Take 500 mg by mouth every 6 (six) hours as needed for mild pain.     Marland Kitchen amLODipine (NORVASC) 10 MG  tablet Take 1 tablet (10 mg total) by mouth daily. 30 tablet 6  . aspirin EC 81 MG tablet Take 1 tablet (81 mg total) by mouth daily. 30 tablet 6  . Calcium Carb-Cholecalciferol (CALCIUM 600+D) 600-800 MG-UNIT TABS Take 1 tablet by mouth 2 (two) times daily.    . carboxymethylcellulose (REFRESH PLUS) 0.5 % SOLN Place 1 drop into both eyes 3 (three) times daily as needed (dry eye).    . Cholecalciferol (VITAMIN D3) 5000 UNITS CAPS Take 1 capsule by mouth daily.    . famotidine-calcium carbonate-magnesium hydroxide (PEPCID  COMPLETE) 10-800-165 MG chewable tablet Chew 1 tablet by mouth daily as needed.    . furosemide (LASIX) 40 MG tablet TAKE 1 TABLET BY MOUTH EVERY DAY. 90 tablet 1  . levothyroxine (SYNTHROID, LEVOTHROID) 75 MCG tablet Take 1 tablet (75 mcg total) by mouth daily before breakfast. 90 tablet 3  . loperamide (IMODIUM A-D) 2 MG tablet Take 2 mg by mouth 4 (four) times daily as needed for diarrhea or loose stools.    . metoprolol succinate (TOPROL-XL) 25 MG 24 hr tablet TAKE 1 TABLET(25 MG) BY MOUTH TWICE DAILY 180 tablet 1  . Multiple Vitamins-Minerals (PRESERVISION AREDS 2 PO) Take 2 tablets by mouth daily.     . Probiotic Product (PROBIOTIC ADVANCED PO) Take 500 mg by mouth daily.      No current facility-administered medications for this encounter.     Allergies:   Patient has no known allergies.   Social History:  The patient  reports that she has never smoked. Her smokeless tobacco use includes chew. She reports that she does not drink alcohol.   Family History:  The patient's family history includes Alcohol abuse in her brother; Asthma in her son.   ROS:  Please see the history of present illness.   All other systems are personally reviewed and negative.   Exam:  (Video/Tele Health Call; Exam is subjective and or/visual.) BP 127/61 HR 51 General:  Speaks in full sentences. No resp difficulty. Lungs: Normal respiratory effort with conversation.  Abdomen: Non-distended per patient report Extremities: Pt denies edema. Neuro: Alert & oriented x 3.   Recent Labs: 05/19/2018: TSH 2.60 07/02/2018: ALT 27 11/05/2018: BUN 63; Creatinine, Ser 2.24; Potassium 4.7; Sodium 140 02/04/2019: Hemoglobin 10.9; Platelets 285  Personally reviewed   Wt Readings from Last 3 Encounters:  11/05/18 58.2 kg (128 lb 3.2 oz)  10/20/18 57.2 kg (126 lb)  10/01/18 58.5 kg (129 lb)      ASSESSMENT AND PLAN:  1. Chronic systolic CHF: EF 25-36% with moderate RV dysfunction on echo in 10/16.  Etiology of the  cardiomyopathy is uncertain: no chest pain or suggestion of CAD, however cannot rule out ischemic cardiomyopathy.  Symptoms noted at a time of significant stress with severe hip pain in 8/16, so cannot rule out Takotsubo CMP.  Viral myocarditis remains possible.  Echo in 11/17 showed recovery of EF to 55%.  11/18 echo showed that EF remained 55%. Therefore, it is possible that the original insult was a stress (Takotsubo-type) cardiomyopathy.  NYHA class II-III symptoms, weight has been stable.   - Continue Lasix 40 mg daily, will get a copy of recent BMET from her nephrologist.  - Continue Toprol XL 25 mg bid.  - Stay off ACEI/ARB/ARNI/spironolactone with CKD IV.  2. CKD: Stage IV. She follows with nephrology.  3. HTN: BP controlled.  - Continue amlodipine 10 mg daily.   COVID screen The patient does not have any  symptoms that suggest any further testing/ screening at this time.  Social distancing reinforced today.  Patient Risk: After full review of this patients clinical status, I feel that they are at moderate risk for cardiac decompensation at this time.  Relevant cardiac medications were reviewed at length with the patient today. The patient does not have concerns regarding their medications at this time.   Recommended follow-up:  4 months  Today, I have spent 15 minutes with the patient with telehealth technology discussing the above issues .    Signed, Loralie Champagne, MD  03/09/2019   [place] [address] [facility] and [place] [address] [phone] (office) [phone] (fax)

## 2019-03-18 ENCOUNTER — Inpatient Hospital Stay: Payer: PPO | Attending: Hematology and Oncology

## 2019-03-18 ENCOUNTER — Inpatient Hospital Stay: Payer: PPO

## 2019-03-18 ENCOUNTER — Encounter: Payer: Self-pay | Admitting: Hematology and Oncology

## 2019-03-18 ENCOUNTER — Ambulatory Visit: Payer: PPO

## 2019-03-18 ENCOUNTER — Inpatient Hospital Stay (HOSPITAL_BASED_OUTPATIENT_CLINIC_OR_DEPARTMENT_OTHER): Payer: PPO | Admitting: Hematology and Oncology

## 2019-03-18 ENCOUNTER — Telehealth: Payer: Self-pay | Admitting: Hematology and Oncology

## 2019-03-18 ENCOUNTER — Other Ambulatory Visit: Payer: Self-pay

## 2019-03-18 DIAGNOSIS — D631 Anemia in chronic kidney disease: Secondary | ICD-10-CM | POA: Diagnosis not present

## 2019-03-18 DIAGNOSIS — N184 Chronic kidney disease, stage 4 (severe): Secondary | ICD-10-CM

## 2019-03-18 LAB — CBC WITH DIFFERENTIAL/PLATELET
Abs Immature Granulocytes: 0.02 10*3/uL (ref 0.00–0.07)
Basophils Absolute: 0.1 10*3/uL (ref 0.0–0.1)
Basophils Relative: 1 %
Eosinophils Absolute: 0.4 10*3/uL (ref 0.0–0.5)
Eosinophils Relative: 5 %
HCT: 37.1 % (ref 36.0–46.0)
Hemoglobin: 11.6 g/dL — ABNORMAL LOW (ref 12.0–15.0)
Immature Granulocytes: 0 %
Lymphocytes Relative: 20 %
Lymphs Abs: 1.5 10*3/uL (ref 0.7–4.0)
MCH: 28.7 pg (ref 26.0–34.0)
MCHC: 31.3 g/dL (ref 30.0–36.0)
MCV: 91.8 fL (ref 80.0–100.0)
Monocytes Absolute: 0.8 10*3/uL (ref 0.1–1.0)
Monocytes Relative: 10 %
Neutro Abs: 4.8 10*3/uL (ref 1.7–7.7)
Neutrophils Relative %: 64 %
Platelets: 266 10*3/uL (ref 150–400)
RBC: 4.04 MIL/uL (ref 3.87–5.11)
RDW: 14 % (ref 11.5–15.5)
WBC: 7.5 10*3/uL (ref 4.0–10.5)
nRBC: 0 % (ref 0.0–0.2)

## 2019-03-18 NOTE — Progress Notes (Signed)
[place] OFFICE PROGRESS NOTE  Nafziger, Tommi Rumps, NP  ASSESSMENT & PLAN:  Anemia in chronic kidney disease Her anemia has been very stable over the last 6 months She barely needed much of the darbepoetin injection I will cancel her injection appointment today I recommend spacing out her appointment to every 3 months and she will get injection whenever her hemoglobin is less than 11 I spoke with the daughter as well and she agreed with the plan of care  Chronic kidney disease (CKD), stage IV (severe) ([place]) She will continue close follow-up with nephrologist.   No orders of the defined types were placed in this encounter.   INTERVAL HISTORY: Mary Guzman 85 y.o. female returns for anemia chronic renal failure She is doing well She has excellent energy level Denies bleeding Her appetite is good No recent infection, fever or chills  SUMMARY OF HEMATOLOGIC HISTORY:  I have reviewed her chart extensively. She has a background history of left breast cancer, moderately differentiated, sized unknown but with involvement of 3/8 lymph nodes, ER positive, PR negative, HER-2/neu unknown. The patient underwent left breast modified radical mastectomy in 1994, followed by radiation therapy. She was placed on tamoxifen for 5 years. She was then placed on Evista, discontinue due to recent acute heart failure She is then found to have severe iron deficiency anemia and anemia chronic disease She received intravenous iron infusion infusion in 2019 and darbopoeitin   I have reviewed the past medical history, past surgical history, social history and family history with the patient and they are unchanged from previous note.  ALLERGIES:  has No Known Allergies.  MEDICATIONS:  Current Outpatient Medications  Medication Sig Dispense Refill  . acetaminophen (TYLENOL) 500 MG tablet Take 500 mg by mouth every 6 (six) hours as needed for mild pain.     Marland Kitchen amLODipine (NORVASC) 10 MG tablet  Take 1 tablet (10 mg total) by mouth daily. 30 tablet 6  . aspirin EC 81 MG tablet Take 1 tablet (81 mg total) by mouth daily. 30 tablet 6  . Calcium Carb-Cholecalciferol (CALCIUM 600+D) 600-800 MG-UNIT TABS Take 1 tablet by mouth 2 (two) times daily.    . carboxymethylcellulose (REFRESH PLUS) 0.5 % SOLN Place 1 drop into both eyes 3 (three) times daily as needed (dry eye).    . Cholecalciferol (VITAMIN D3) 5000 UNITS CAPS Take 1 capsule by mouth daily.    . famotidine-calcium carbonate-magnesium hydroxide (PEPCID COMPLETE) 10-800-165 MG chewable tablet Chew 1 tablet by mouth daily as needed.    . furosemide (LASIX) 40 MG tablet TAKE 1 TABLET BY MOUTH EVERY DAY. 90 tablet 1  . levothyroxine (SYNTHROID, LEVOTHROID) 75 MCG tablet Take 1 tablet (75 mcg total) by mouth daily before breakfast. 90 tablet 3  . loperamide (IMODIUM A-D) 2 MG tablet Take 2 mg by mouth 4 (four) times daily as needed for diarrhea or loose stools.    . metoprolol succinate (TOPROL-XL) 25 MG 24 hr tablet TAKE 1 TABLET(25 MG) BY MOUTH TWICE DAILY 180 tablet 1  . Multiple Vitamins-Minerals (PRESERVISION AREDS 2 PO) Take 2 tablets by mouth daily.     . Probiotic Product (PROBIOTIC ADVANCED PO) Take 500 mg by mouth daily.      No current facility-administered medications for this visit.      REVIEW OF SYSTEMS:   Constitutional: Denies fevers, chills or night sweats Eyes: Denies blurriness of vision Ears, nose, mouth, throat, and face: Denies mucositis or sore throat Respiratory: Denies cough,  dyspnea or wheezes Cardiovascular: Denies palpitation, chest discomfort or lower extremity swelling Gastrointestinal:  Denies nausea, heartburn or change in bowel habits Skin: Denies abnormal skin rashes Lymphatics: Denies new lymphadenopathy or easy bruising Neurological:Denies numbness, tingling or new weaknesses Behavioral/Psych: Mood is stable, no new changes  All other systems were reviewed with the patient and are  negative.  PHYSICAL EXAMINATION: ECOG PERFORMANCE STATUS: 1 - Symptomatic but completely ambulatory  Vitals:   03/18/19 1223  BP: (!) 144/55  Pulse: (!) 53  Resp: 16  Temp: 98.6 F (37 C)  SpO2: 99%   Filed Weights   03/18/19 1223  Weight: 127 lb 6.4 oz (57.8 kg)    GENERAL:alert, no distress and comfortable NEURO: alert & oriented x 3 with fluent speech, no focal motor/sensory deficits  LABORATORY DATA:  I have reviewed the data as listed     Component Value Date/Time   NA 140 11/05/2018 1427   NA 140 06/02/2013 1347   K 4.7 11/05/2018 1427   K 5.8 (H) 06/02/2013 1347   CL 107 11/05/2018 1427   CL 104 05/14/2012 1522   CO2 22 11/05/2018 1427   CO2 20 (L) 06/02/2013 1347   GLUCOSE 95 11/05/2018 1427   GLUCOSE 97 06/02/2013 1347   GLUCOSE 91 05/14/2012 1522   BUN 63 (H) 11/05/2018 1427   BUN 50.1 (H) 06/02/2013 1347   CREATININE 2.24 (H) 11/05/2018 1427   CREATININE 2.3 (H) 06/02/2013 1347   CALCIUM 9.2 11/05/2018 1427   CALCIUM 8.8 06/02/2013 1347   PROT 7.1 07/02/2018 1159   PROT 6.9 06/02/2013 1347   ALBUMIN 3.6 07/02/2018 1159   ALBUMIN 3.6 06/02/2013 1347   AST 34 07/02/2018 1159   AST 26 06/02/2013 1347   ALT 27 07/02/2018 1159   ALT 14 06/02/2013 1347   ALKPHOS 320 (H) 07/02/2018 1159   ALKPHOS 54 06/02/2013 1347   BILITOT 0.3 07/02/2018 1159   BILITOT 0.23 06/02/2013 1347   GFRNONAA 18 (L) 11/05/2018 1427   GFRAA 21 (L) 11/05/2018 1427    No results found for: SPEP, UPEP  Lab Results  Component Value Date   WBC 7.5 03/18/2019   NEUTROABS 4.8 03/18/2019   HGB 11.6 (L) 03/18/2019   HCT 37.1 03/18/2019   MCV 91.8 03/18/2019   PLT 266 03/18/2019      Chemistry      Component Value Date/Time   NA 140 11/05/2018 1427   NA 140 06/02/2013 1347   K 4.7 11/05/2018 1427   K 5.8 (H) 06/02/2013 1347   CL 107 11/05/2018 1427   CL 104 05/14/2012 1522   CO2 22 11/05/2018 1427   CO2 20 (L) 06/02/2013 1347   BUN 63 (H) 11/05/2018 1427   BUN  50.1 (H) 06/02/2013 1347   CREATININE 2.24 (H) 11/05/2018 1427   CREATININE 2.3 (H) 06/02/2013 1347      Component Value Date/Time   CALCIUM 9.2 11/05/2018 1427   CALCIUM 8.8 06/02/2013 1347   ALKPHOS 320 (H) 07/02/2018 1159   ALKPHOS 54 06/02/2013 1347   AST 34 07/02/2018 1159   AST 26 06/02/2013 1347   ALT 27 07/02/2018 1159   ALT 14 06/02/2013 1347   BILITOT 0.3 07/02/2018 1159   BILITOT 0.23 06/02/2013 1347     I spent 10 minutes counseling the patient face to face. The total time spent in the appointment was 15 minutes and more than 50% was on counseling.   All questions were answered. The patient knows to call the  clinic with any problems, questions or concerns. No barriers to learning was detected.    Heath Lark, MD 7/2/20201:08 PM

## 2019-03-18 NOTE — Telephone Encounter (Signed)
I left a message for patient regarding schedule  

## 2019-03-18 NOTE — Assessment & Plan Note (Signed)
She will continue close follow-up with nephrologist.

## 2019-03-18 NOTE — Assessment & Plan Note (Signed)
Her anemia has been very stable over the last 6 months She barely needed much of the darbepoetin injection I will cancel her injection appointment today I recommend spacing out her appointment to every 3 months and she will get injection whenever her hemoglobin is less than 11 I spoke with the daughter as well and she agreed with the plan of care

## 2019-03-19 ENCOUNTER — Other Ambulatory Visit (HOSPITAL_COMMUNITY): Payer: Self-pay | Admitting: Cardiology

## 2019-04-06 DIAGNOSIS — H35423 Microcystoid degeneration of retina, bilateral: Secondary | ICD-10-CM | POA: Diagnosis not present

## 2019-04-06 DIAGNOSIS — H43813 Vitreous degeneration, bilateral: Secondary | ICD-10-CM | POA: Diagnosis not present

## 2019-04-06 DIAGNOSIS — H353123 Nonexudative age-related macular degeneration, left eye, advanced atrophic without subfoveal involvement: Secondary | ICD-10-CM | POA: Diagnosis not present

## 2019-04-06 DIAGNOSIS — H353114 Nonexudative age-related macular degeneration, right eye, advanced atrophic with subfoveal involvement: Secondary | ICD-10-CM | POA: Diagnosis not present

## 2019-04-07 ENCOUNTER — Telehealth (HOSPITAL_COMMUNITY): Payer: Self-pay

## 2019-04-07 NOTE — Telephone Encounter (Signed)
I believe that is coq10.  Should be ok.

## 2019-04-07 NOTE — Telephone Encounter (Signed)
Daughter called stating patient was seen my optomologist yesterday and was prescribed Mito-Q for her eyes.  She read up on medicine and is reluctant as it says it causes worsening renal impairment.  Please advise if you think medicine is ok to use.  Routed to Dr Aundra Dubin

## 2019-04-08 NOTE — Telephone Encounter (Signed)
Spoke with daughter and advise of Dr Oleh Genin response. Still reluctant to use as she does not know what good will result for treatment of eye disease. Advised to explore further with eye doctor and get an explanation from them of why it was prescribed as part of treatment plan.  Verbalized understanding.

## 2019-04-20 ENCOUNTER — Ambulatory Visit: Payer: PPO | Admitting: Adult Health

## 2019-05-14 ENCOUNTER — Other Ambulatory Visit: Payer: Self-pay

## 2019-05-14 DIAGNOSIS — R6889 Other general symptoms and signs: Secondary | ICD-10-CM | POA: Diagnosis not present

## 2019-05-14 DIAGNOSIS — Z20822 Contact with and (suspected) exposure to covid-19: Secondary | ICD-10-CM

## 2019-05-16 LAB — NOVEL CORONAVIRUS, NAA: SARS-CoV-2, NAA: NOT DETECTED

## 2019-05-26 ENCOUNTER — Encounter (HOSPITAL_COMMUNITY): Payer: Self-pay | Admitting: Emergency Medicine

## 2019-05-26 ENCOUNTER — Emergency Department (HOSPITAL_COMMUNITY)
Admission: EM | Admit: 2019-05-26 | Discharge: 2019-05-26 | Disposition: A | Discharge status: Home / Self Care | Payer: PPO | Source: Home / Self Care | Attending: Emergency Medicine | Admitting: Emergency Medicine

## 2019-05-26 ENCOUNTER — Emergency Department (HOSPITAL_COMMUNITY): Payer: PPO

## 2019-05-26 ENCOUNTER — Other Ambulatory Visit: Payer: Self-pay

## 2019-05-26 DIAGNOSIS — Z7982 Long term (current) use of aspirin: Secondary | ICD-10-CM | POA: Insufficient documentation

## 2019-05-26 DIAGNOSIS — S42201A Unspecified fracture of upper end of right humerus, initial encounter for closed fracture: Secondary | ICD-10-CM

## 2019-05-26 DIAGNOSIS — N183 Chronic kidney disease, stage 3 (moderate): Secondary | ICD-10-CM | POA: Insufficient documentation

## 2019-05-26 DIAGNOSIS — E039 Hypothyroidism, unspecified: Secondary | ICD-10-CM | POA: Insufficient documentation

## 2019-05-26 DIAGNOSIS — M2550 Pain in unspecified joint: Secondary | ICD-10-CM | POA: Diagnosis not present

## 2019-05-26 DIAGNOSIS — Y939 Activity, unspecified: Secondary | ICD-10-CM | POA: Insufficient documentation

## 2019-05-26 DIAGNOSIS — I130 Hypertensive heart and chronic kidney disease with heart failure and stage 1 through stage 4 chronic kidney disease, or unspecified chronic kidney disease: Secondary | ICD-10-CM | POA: Insufficient documentation

## 2019-05-26 DIAGNOSIS — I5022 Chronic systolic (congestive) heart failure: Secondary | ICD-10-CM | POA: Insufficient documentation

## 2019-05-26 DIAGNOSIS — Z7401 Bed confinement status: Secondary | ICD-10-CM | POA: Diagnosis not present

## 2019-05-26 DIAGNOSIS — R52 Pain, unspecified: Secondary | ICD-10-CM | POA: Diagnosis not present

## 2019-05-26 DIAGNOSIS — W19XXXA Unspecified fall, initial encounter: Secondary | ICD-10-CM | POA: Insufficient documentation

## 2019-05-26 DIAGNOSIS — Z79899 Other long term (current) drug therapy: Secondary | ICD-10-CM | POA: Insufficient documentation

## 2019-05-26 DIAGNOSIS — R5381 Other malaise: Secondary | ICD-10-CM | POA: Diagnosis not present

## 2019-05-26 DIAGNOSIS — Y999 Unspecified external cause status: Secondary | ICD-10-CM | POA: Insufficient documentation

## 2019-05-26 DIAGNOSIS — Y929 Unspecified place or not applicable: Secondary | ICD-10-CM | POA: Insufficient documentation

## 2019-05-26 MED ORDER — HYDROCODONE-ACETAMINOPHEN 5-325 MG PO TABS
1.0000 | ORAL_TABLET | Freq: Once | ORAL | Status: AC
  Administered 2019-05-26: 1 via ORAL
  Filled 2019-05-26: qty 1

## 2019-05-26 MED ORDER — FENTANYL CITRATE (PF) 100 MCG/2ML IJ SOLN
25.0000 ug | INTRAMUSCULAR | Status: DC | PRN
  Administered 2019-05-26 (×3): 25 ug via SUBCUTANEOUS
  Filled 2019-05-26 (×3): qty 2

## 2019-05-26 MED ORDER — TRAMADOL-ACETAMINOPHEN 37.5-325 MG PO TABS: 1.0000 | ORAL_TABLET | Freq: Four times a day (QID) | ORAL | 0 refills | Status: DC | PRN

## 2019-05-26 NOTE — ED Notes (Signed)
This RN called [facility] for transportation. Informed it would be approximately 1.5 hours until pick up. Will continue to monitor.

## 2019-05-26 NOTE — ED Triage Notes (Signed)
Per EMS, patient from home, reports mechanical fall while walking with cane this morning. Lives alone. Unknown time down. Patient states it was dark outside when she fell. Denies head injury and LOC. States fell on right arm. C/o right arm pain. Hematoma and bruising to right arm. A&Ox4.

## 2019-05-26 NOTE — ED Notes (Signed)
Called [facility] for updated ETA of 1 hour

## 2019-05-26 NOTE — TOC Initial Note (Signed)
Transition of Care [facility]) - Initial/Assessment Note    Patient Details  Name: Mary Guzman MRN: [id number] Date of Birth: [birth year]  [facility] CM/SW Contact:    Erenest Rasher, RN Phone Number: [phone] 05/26/2019, 5:49 PM  Clinical Narrative:                 Spoke to pt's daughter, Shenna Brissette. Pt has RW and cane at home. She stays with pt during the day and pt is at home at night. Dtr plans to stay with pt until she is better. Offered [facility] for [facility]. Dtr is agreeable to [facility] for [facility]. Contacted [employer] rep, Dorian Pod with new referral.   Expected Discharge Plan: [place] Barriers to Discharge: No Barriers Identified   Patient Goals and CMS Choice Patient states their goals for this hospitalization and ongoing recovery are:: to avoid future injury or falls CMS Medicare.gov Compare Post Acute Care list provided to:: Patient Represenative (must comment)    Expected Discharge Plan and Services Expected Discharge Plan: [place]   Discharge Planning Services: CM Consult Post Acute Care Choice: [place] arrangements for the past 2 months: Single Family Home                           HH Arranged: RN, PT, OT, Nurse's Aide, Social Work [facility] Agency: [facility] Date [place]: 05/26/19 Time [place]: [place] Representative spoke with at Deputy: Glyn Ade  Prior Living Arrangements/Services Living arrangements for the past 2 months: [place] with:: Self Patient language and need for interpreter reviewed:: Yes Do you feel safe going back to the place where you live?: Yes      Need for Family Participation in Patient Care: Yes (Comment) Care giver support system in place?: Yes (comment) Current home services: DME(rolling walker, cane) Criminal Activity/Legal Involvement Pertinent to Current Situation/Hospitalization: No - Comment as needed  Activities of Daily  Living      Permission Sought/Granted Permission sought to share information with : Case Manager Permission granted to share information with : Yes, Verbal Permission Granted  Share Information with NAME: Amzie Sillas  Permission granted to share info w AGENCY: [facility] [place], [place] granted to share info w Relationship: daughter  Permission granted to share info w Contact Information: [phone]  Emotional Assessment       Orientation: : Oriented to Self, Oriented to Place, Oriented to  Time, Oriented to Situation   Psych Involvement: No (comment)  Admission diagnosis:  fall Patient Active Problem List   Diagnosis Date Noted  . Deficiency anemia 06/02/2018  . Chronic kidney disease (CKD), stage IV (severe) ([place]) 06/02/2018  . Iron deficiency anemia due to chronic blood loss 06/01/2018  . Anemia in chronic kidney disease 07/02/2016  . CKD (chronic kidney disease), stage III ([place]) 07/29/2015  . Chronic systolic CHF (congestive heart failure) ([place]) 07/16/2015  . Acute on chronic systolic heart failure ([place]) 06/30/2015  . Bilateral leg edema 07/27/2014  . Chronic kidney disease 06/07/2013  . Essential hypertension 07/03/2009  . Hypothyroidism 03/16/2007  . GERD 03/16/2007  . Osteoarthritis 03/16/2007  . Osteoporosis 03/16/2007  . BREAST CANCER, HX OF 03/16/2007   PCP:  [name], NP Pharmacy:   [facility] ORDER) [place], [place] [address] Phone: [phone] Fax: [phone]  [facility] [place], [place] AT [facility] [address] [place] [address] Phone: [phone] Fax: [phone]     Social Determinants of Health (SDOH) Interventions    Readmission Risk Interventions No flowsheet data found.

## 2019-05-26 NOTE — Discharge Instructions (Signed)
She appears to have an isolated injury to her right humerus, there will be treated with a sling immobilizer.  Keep the immobilizer on, except when changing or bathing, until following up with the orthopedic doctor.  Use ice on the sore area 3 or 4 times a day for several days.  After that heat can help.  We sent a prescription for pain reliever to her pharmacy.  She can try taking only Tylenol for pain if that suffices.  She will likely need some assistance when ambulating.  Someone should stay with her until she follows up with the orthopedic doctor.  We are going to have home health services check on her to see if she needs additional treatment at home.  Return here, if needed, for problems.

## 2019-05-26 NOTE — ED Notes (Signed)
Pt and Daughter verbalized understanding of D/C instructions. [facility] called for transportation and belongings returned

## 2019-05-26 NOTE — ED Notes (Signed)
Patient transported to X-ray 

## 2019-05-26 NOTE — ED Notes (Signed)
[facility] at bedside 

## 2019-05-26 NOTE — ED Provider Notes (Signed)
[facility] DEPT Provider Note   CSN: [id number] Arrival date & time: 05/26/19  1217     History   Chief Complaint Chief Complaint  Patient presents with  . Fall    HPI Mary Guzman is a 85 y.o. female.     HPI  Past Medical History:  Diagnosis Date  . 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  . DJD (degenerative joint disease)   . GERD (gastroesophageal reflux disease)   . Hypertension   . Osteoporosis   . Thyroid disease    Hypothyroidism    Patient Active Problem List   Diagnosis Date Noted  . Deficiency anemia 06/02/2018  . Chronic kidney disease (CKD), stage IV (severe) ([place]) 06/02/2018  . Iron deficiency anemia due to chronic blood loss 06/01/2018  . Anemia in chronic kidney disease 07/02/2016  . CKD (chronic kidney disease), stage III ([place]) 07/29/2015  . Chronic systolic CHF (congestive heart failure) ([place]) 07/16/2015  . Acute on chronic systolic heart failure ([place]) 06/30/2015  . Bilateral leg edema 07/27/2014  . Chronic kidney disease 06/07/2013  . Essential hypertension 07/03/2009  . Hypothyroidism 03/16/2007  . GERD 03/16/2007  . Osteoarthritis 03/16/2007  . Osteoporosis 03/16/2007  . BREAST CANCER, HX OF 03/16/2007    Past Surgical History:  Procedure Laterality Date  . ANKLE SURGERY     Right  . INTRACAPSULAR CATARACT EXTRACTION    . MASTECTOMY  1994     OB History    Gravida  2   Para  2   Term      Preterm      AB  0   Living        SAB      TAB      Ectopic      Multiple      Live Births               Home Medications    Prior to Admission medications   Medication Sig Start Date End Date Taking? Authorizing Provider  acetaminophen (TYLENOL) 500 MG tablet Take 500 mg by mouth every 6 (six) hours as needed for mild pain.     [provider]  amLODipine (NORVASC) 10 MG tablet TAKE 1 TABLET(10 MG) BY MOUTH DAILY  03/22/19   Larey Dresser, MD  aspirin EC 81 MG tablet Take 1 tablet (81 mg total) by mouth daily. 07/29/16   Larey Dresser, MD  Calcium Carb-Cholecalciferol (CALCIUM 600+D) 600-800 MG-UNIT TABS Take 1 tablet by mouth 2 (two) times daily.    [provider]  carboxymethylcellulose (REFRESH PLUS) 0.5 % SOLN Place 1 drop into both eyes 3 (three) times daily as needed (dry eye).    [provider]  Cholecalciferol (VITAMIN D3) 5000 UNITS CAPS Take 1 capsule by mouth daily.    [provider]  famotidine-calcium carbonate-magnesium hydroxide (PEPCID COMPLETE) 10-800-165 MG chewable tablet Chew 1 tablet by mouth daily as needed.    [provider]  furosemide (LASIX) 40 MG tablet TAKE 1 TABLET BY MOUTH EVERY DAY. 12/21/18   Larey Dresser, MD  levothyroxine (SYNTHROID, LEVOTHROID) 75 MCG tablet Take 1 tablet (75 mcg total) by mouth daily before breakfast. 09/22/18   Larey Dresser, MD  loperamide (IMODIUM A-D) 2 MG tablet Take 2 mg by mouth 4 (four) times daily as needed for diarrhea or loose stools.    [provider]  metoprolol succinate (TOPROL-XL) 25 MG 24 hr tablet TAKE 1 TABLET(25 MG) BY MOUTH TWICE DAILY 11/03/18   Larey Dresser, MD  Multiple Vitamins-Minerals (PRESERVISION AREDS 2 PO) Take 2 tablets by mouth daily.     [provider]  Probiotic Product (PROBIOTIC ADVANCED PO) Take 500 mg by mouth daily.     [provider]  traMADol-acetaminophen (ULTRACET) 37.5-325 MG tablet Take 1 tablet by mouth every 6 (six) hours as needed. 05/26/19   Daleen Bo, MD    Family History Family History  Problem Relation Age of Onset  . Alcohol abuse Brother   . Asthma Son     Social History Social History   Tobacco Use  . Smoking status: Never Smoker  . Smokeless tobacco: Current User    Types: Chew  Substance Use Topics  . Alcohol use: No  . Drug use: Not on file     Allergies   Patient has no known allergies.   Review  of Systems Review of Systems   Physical Exam Updated Vital Signs BP 129/63 (BP Location: Left Arm)   Pulse 66   Temp 98.2 F (36.8 C) (Oral)   Resp 16   SpO2 100%   Physical Exam   ED Treatments / Results  Labs (all labs ordered are listed, but only abnormal results are displayed) Labs Reviewed - No data to display  EKG None  Radiology Dg Humerus Right  Result Date: 05/26/2019 CLINICAL DATA:  [place] at home, upper arm pain EXAM: RIGHT HUMERUS - 2+ VIEW COMPARISON:  None. FINDINGS: Acute fracture involving the right humeral neck with impaction and mild posterior angulation of distal fracture fragment. Humeral head appears to project over the glenoid. IMPRESSION: Acute impacted and slightly angulated fracture involving the right humeral neck Electronically Signed   By: Donavan Foil M.D.   On: 05/26/2019 13:40    Procedures Procedures (including critical care time)  Medications Ordered in ED Medications  fentaNYL (SUBLIMAZE) injection 25 mcg (25 mcg Subcutaneous Given 05/26/19 1319)     Initial Impression / Assessment and Plan / ED Course  I have reviewed the triage vital signs and the nursing notes.  Pertinent labs & imaging results that were available during my care of the patient were reviewed by me and considered in my medical decision making (see chart for details).  Clinical Course as of May 25 1518  Wed May 26, 2019  1400 DG Humerus Right [EW]    Clinical Course User Index [EW] Daleen Bo, MD        Patient Vitals for the past 24 hrs:  BP Temp Temp src Pulse Resp SpO2  05/26/19 1230 129/63 98.2 F (36.8 C) Oral 66 16 100 %    3:19 PM Reevaluation with update and discussion. After initial assessment and treatment, an updated evaluation reveals no additional complaints.  Findings discussed with patient daughter, at the bedside, will get her pain to help her ambulate.  The daughter states that she can stay with the patient to help her, at this time.  I  discussed home health services, and orthopedic follow-up with the daughter.  She expresses understanding and agreement.  All questions answered. Daleen Bo   Medical Decision Making: Isolated injury, right humerus, proximal, not requiring surgical intervention.  Patient is elderly, and somewhat debilitated however abdominal husband manage herself at home.  Patient's daughter is available to help her.  She is stable for discharge with outpatient management.  CRITICAL CARE-no Performed by: Daleen Bo  Nursing Notes Reviewed/ Care Coordinated Applicable Imaging Reviewed Interpretation of Laboratory Data incorporated into ED treatment  The patient appears reasonably screened and/or stabilized for discharge and I doubt any other medical condition or other [facility] requiring further screening, evaluation, or treatment in the ED at this time prior to discharge.  Plan: Home Medications-Tylenol for pain, prescription as below; Home Treatments-shoulder immobilizer, until seen by orthopedics; return here if the recommended treatment, does not improve the symptoms; Recommended follow up-PCP, PRN.  Orthopedics 1 to 2 weeks.   Final Clinical Impressions(s) / ED Diagnoses   Final diagnoses:  Closed fracture of proximal end of right humerus, unspecified fracture morphology, initial encounter    ED Discharge Orders         Ordered    traMADol-acetaminophen (ULTRACET) 37.5-325 MG tablet  Every 6 hours PRN     05/26/19 1513           Daleen Bo, MD 05/26/19 1519

## 2019-05-27 ENCOUNTER — Emergency Department (HOSPITAL_COMMUNITY): Payer: PPO

## 2019-05-27 ENCOUNTER — Ambulatory Visit: Payer: Self-pay | Admitting: *Deleted

## 2019-05-27 ENCOUNTER — Inpatient Hospital Stay (HOSPITAL_COMMUNITY)
Admission: EM | Admit: 2019-05-27 | Discharge: 2019-06-08 | DRG: 338 | Disposition: A | Discharge status: Skilled Nursing Facility | Payer: PPO | Attending: Family Medicine | Admitting: Family Medicine

## 2019-05-27 ENCOUNTER — Other Ambulatory Visit: Payer: Self-pay

## 2019-05-27 ENCOUNTER — Telehealth: Payer: Self-pay

## 2019-05-27 ENCOUNTER — Encounter (HOSPITAL_COMMUNITY): Payer: Self-pay | Admitting: Emergency Medicine

## 2019-05-27 DIAGNOSIS — K37 Unspecified appendicitis: Secondary | ICD-10-CM | POA: Diagnosis not present

## 2019-05-27 DIAGNOSIS — I808 Phlebitis and thrombophlebitis of other sites: Secondary | ICD-10-CM | POA: Diagnosis not present

## 2019-05-27 DIAGNOSIS — N179 Acute kidney failure, unspecified: Secondary | ICD-10-CM | POA: Diagnosis present

## 2019-05-27 DIAGNOSIS — I5022 Chronic systolic (congestive) heart failure: Secondary | ICD-10-CM | POA: Diagnosis not present

## 2019-05-27 DIAGNOSIS — M1990 Unspecified osteoarthritis, unspecified site: Secondary | ICD-10-CM | POA: Diagnosis present

## 2019-05-27 DIAGNOSIS — Z20828 Contact with and (suspected) exposure to other viral communicable diseases: Secondary | ICD-10-CM | POA: Diagnosis present

## 2019-05-27 DIAGNOSIS — I5042 Chronic combined systolic (congestive) and diastolic (congestive) heart failure: Secondary | ICD-10-CM | POA: Diagnosis present

## 2019-05-27 DIAGNOSIS — E039 Hypothyroidism, unspecified: Secondary | ICD-10-CM | POA: Diagnosis not present

## 2019-05-27 DIAGNOSIS — R9431 Abnormal electrocardiogram [ECG] [EKG]: Secondary | ICD-10-CM | POA: Diagnosis not present

## 2019-05-27 DIAGNOSIS — R54 Age-related physical debility: Secondary | ICD-10-CM | POA: Diagnosis not present

## 2019-05-27 DIAGNOSIS — I10 Essential (primary) hypertension: Secondary | ICD-10-CM | POA: Diagnosis present

## 2019-05-27 DIAGNOSIS — R0902 Hypoxemia: Secondary | ICD-10-CM | POA: Diagnosis not present

## 2019-05-27 DIAGNOSIS — R55 Syncope and collapse: Secondary | ICD-10-CM

## 2019-05-27 DIAGNOSIS — D631 Anemia in chronic kidney disease: Secondary | ICD-10-CM | POA: Diagnosis present

## 2019-05-27 DIAGNOSIS — G319 Degenerative disease of nervous system, unspecified: Secondary | ICD-10-CM | POA: Diagnosis not present

## 2019-05-27 DIAGNOSIS — E038 Other specified hypothyroidism: Secondary | ICD-10-CM | POA: Diagnosis not present

## 2019-05-27 DIAGNOSIS — W19XXXA Unspecified fall, initial encounter: Secondary | ICD-10-CM | POA: Diagnosis present

## 2019-05-27 DIAGNOSIS — Z79890 Hormone replacement therapy (postmenopausal): Secondary | ICD-10-CM

## 2019-05-27 DIAGNOSIS — Z7982 Long term (current) use of aspirin: Secondary | ICD-10-CM

## 2019-05-27 DIAGNOSIS — M2550 Pain in unspecified joint: Secondary | ICD-10-CM | POA: Diagnosis not present

## 2019-05-27 DIAGNOSIS — Z853 Personal history of malignant neoplasm of breast: Secondary | ICD-10-CM | POA: Diagnosis not present

## 2019-05-27 DIAGNOSIS — I959 Hypotension, unspecified: Secondary | ICD-10-CM | POA: Diagnosis not present

## 2019-05-27 DIAGNOSIS — K353 Acute appendicitis with localized peritonitis, without perforation or gangrene: Secondary | ICD-10-CM | POA: Diagnosis not present

## 2019-05-27 DIAGNOSIS — M810 Age-related osteoporosis without current pathological fracture: Secondary | ICD-10-CM | POA: Diagnosis present

## 2019-05-27 DIAGNOSIS — K449 Diaphragmatic hernia without obstruction or gangrene: Secondary | ICD-10-CM | POA: Diagnosis not present

## 2019-05-27 DIAGNOSIS — K219 Gastro-esophageal reflux disease without esophagitis: Secondary | ICD-10-CM | POA: Diagnosis present

## 2019-05-27 DIAGNOSIS — K3532 Acute appendicitis with perforation and localized peritonitis, without abscess: Secondary | ICD-10-CM | POA: Diagnosis not present

## 2019-05-27 DIAGNOSIS — M6281 Muscle weakness (generalized): Secondary | ICD-10-CM | POA: Diagnosis not present

## 2019-05-27 DIAGNOSIS — Y92009 Unspecified place in unspecified non-institutional (private) residence as the place of occurrence of the external cause: Secondary | ICD-10-CM

## 2019-05-27 DIAGNOSIS — J9811 Atelectasis: Secondary | ICD-10-CM | POA: Diagnosis not present

## 2019-05-27 DIAGNOSIS — S42201D Unspecified fracture of upper end of right humerus, subsequent encounter for fracture with routine healing: Secondary | ICD-10-CM

## 2019-05-27 DIAGNOSIS — S42201A Unspecified fracture of upper end of right humerus, initial encounter for closed fracture: Secondary | ICD-10-CM | POA: Diagnosis not present

## 2019-05-27 DIAGNOSIS — R0602 Shortness of breath: Secondary | ICD-10-CM | POA: Diagnosis not present

## 2019-05-27 DIAGNOSIS — R52 Pain, unspecified: Secondary | ICD-10-CM | POA: Diagnosis not present

## 2019-05-27 DIAGNOSIS — I130 Hypertensive heart and chronic kidney disease with heart failure and stage 1 through stage 4 chronic kidney disease, or unspecified chronic kidney disease: Secondary | ICD-10-CM | POA: Diagnosis not present

## 2019-05-27 DIAGNOSIS — I110 Hypertensive heart disease with heart failure: Secondary | ICD-10-CM | POA: Diagnosis not present

## 2019-05-27 DIAGNOSIS — E860 Dehydration: Secondary | ICD-10-CM | POA: Diagnosis present

## 2019-05-27 DIAGNOSIS — R609 Edema, unspecified: Secondary | ICD-10-CM | POA: Diagnosis not present

## 2019-05-27 DIAGNOSIS — Z23 Encounter for immunization: Secondary | ICD-10-CM | POA: Diagnosis not present

## 2019-05-27 DIAGNOSIS — I248 Other forms of acute ischemic heart disease: Secondary | ICD-10-CM | POA: Diagnosis present

## 2019-05-27 DIAGNOSIS — D539 Nutritional anemia, unspecified: Secondary | ICD-10-CM | POA: Diagnosis not present

## 2019-05-27 DIAGNOSIS — N183 Chronic kidney disease, stage 3 unspecified: Secondary | ICD-10-CM | POA: Diagnosis present

## 2019-05-27 DIAGNOSIS — N289 Disorder of kidney and ureter, unspecified: Secondary | ICD-10-CM | POA: Diagnosis not present

## 2019-05-27 DIAGNOSIS — K3533 Acute appendicitis with perforation and localized peritonitis, with abscess: Secondary | ICD-10-CM | POA: Diagnosis not present

## 2019-05-27 DIAGNOSIS — S42301D Unspecified fracture of shaft of humerus, right arm, subsequent encounter for fracture with routine healing: Secondary | ICD-10-CM | POA: Diagnosis not present

## 2019-05-27 DIAGNOSIS — I6782 Cerebral ischemia: Secondary | ICD-10-CM | POA: Diagnosis not present

## 2019-05-27 DIAGNOSIS — Z48815 Encounter for surgical aftercare following surgery on the digestive system: Secondary | ICD-10-CM | POA: Diagnosis not present

## 2019-05-27 DIAGNOSIS — R41841 Cognitive communication deficit: Secondary | ICD-10-CM | POA: Diagnosis not present

## 2019-05-27 DIAGNOSIS — J90 Pleural effusion, not elsewhere classified: Secondary | ICD-10-CM | POA: Diagnosis not present

## 2019-05-27 DIAGNOSIS — R2689 Other abnormalities of gait and mobility: Secondary | ICD-10-CM | POA: Diagnosis not present

## 2019-05-27 DIAGNOSIS — K3580 Unspecified acute appendicitis: Secondary | ICD-10-CM

## 2019-05-27 DIAGNOSIS — E43 Unspecified severe protein-calorie malnutrition: Secondary | ICD-10-CM | POA: Diagnosis not present

## 2019-05-27 DIAGNOSIS — K529 Noninfective gastroenteritis and colitis, unspecified: Secondary | ICD-10-CM | POA: Diagnosis not present

## 2019-05-27 DIAGNOSIS — S42211A Unspecified displaced fracture of surgical neck of right humerus, initial encounter for closed fracture: Secondary | ICD-10-CM | POA: Diagnosis present

## 2019-05-27 DIAGNOSIS — Z7401 Bed confinement status: Secondary | ICD-10-CM | POA: Diagnosis not present

## 2019-05-27 DIAGNOSIS — N185 Chronic kidney disease, stage 5: Secondary | ICD-10-CM | POA: Diagnosis not present

## 2019-05-27 DIAGNOSIS — Z66 Do not resuscitate: Secondary | ICD-10-CM | POA: Diagnosis not present

## 2019-05-27 DIAGNOSIS — S42301A Unspecified fracture of shaft of humerus, right arm, initial encounter for closed fracture: Secondary | ICD-10-CM

## 2019-05-27 DIAGNOSIS — Z9181 History of falling: Secondary | ICD-10-CM

## 2019-05-27 DIAGNOSIS — I5023 Acute on chronic systolic (congestive) heart failure: Secondary | ICD-10-CM | POA: Diagnosis not present

## 2019-05-27 DIAGNOSIS — Z79899 Other long term (current) drug therapy: Secondary | ICD-10-CM

## 2019-05-27 DIAGNOSIS — I213 ST elevation (STEMI) myocardial infarction of unspecified site: Secondary | ICD-10-CM | POA: Diagnosis not present

## 2019-05-27 DIAGNOSIS — E785 Hyperlipidemia, unspecified: Secondary | ICD-10-CM | POA: Diagnosis not present

## 2019-05-27 DIAGNOSIS — J810 Acute pulmonary edema: Secondary | ICD-10-CM | POA: Diagnosis not present

## 2019-05-27 DIAGNOSIS — R531 Weakness: Secondary | ICD-10-CM | POA: Diagnosis not present

## 2019-05-27 DIAGNOSIS — N184 Chronic kidney disease, stage 4 (severe): Secondary | ICD-10-CM | POA: Diagnosis present

## 2019-05-27 DIAGNOSIS — F17220 Nicotine dependence, chewing tobacco, uncomplicated: Secondary | ICD-10-CM | POA: Diagnosis present

## 2019-05-27 DIAGNOSIS — J811 Chronic pulmonary edema: Secondary | ICD-10-CM

## 2019-05-27 DIAGNOSIS — K388 Other specified diseases of appendix: Secondary | ICD-10-CM | POA: Diagnosis not present

## 2019-05-27 DIAGNOSIS — R278 Other lack of coordination: Secondary | ICD-10-CM | POA: Diagnosis not present

## 2019-05-27 DIAGNOSIS — Z9010 Acquired absence of unspecified breast and nipple: Secondary | ICD-10-CM

## 2019-05-27 DIAGNOSIS — Z6826 Body mass index (BMI) 26.0-26.9, adult: Secondary | ICD-10-CM

## 2019-05-27 LAB — COMPREHENSIVE METABOLIC PANEL
ALT: 25 U/L (ref 0–44)
AST: 38 U/L (ref 15–41)
Albumin: 2.7 g/dL — ABNORMAL LOW (ref 3.5–5.0)
Alkaline Phosphatase: 228 U/L — ABNORMAL HIGH (ref 38–126)
Anion gap: 13 (ref 5–15)
BUN: 63 mg/dL — ABNORMAL HIGH (ref 8–23)
CO2: 20 mmol/L — ABNORMAL LOW (ref 22–32)
Calcium: 8.3 mg/dL — ABNORMAL LOW (ref 8.9–10.3)
Chloride: 105 mmol/L (ref 98–111)
Creatinine, Ser: 2.91 mg/dL — ABNORMAL HIGH (ref 0.44–1.00)
GFR calc Af Amer: 15 mL/min — ABNORMAL LOW (ref >60)
GFR calc non Af Amer: 13 mL/min — ABNORMAL LOW (ref >60)
Glucose, Bld: 110 mg/dL — ABNORMAL HIGH (ref 70–99)
Potassium: 3.8 mmol/L (ref 3.5–5.1)
Sodium: 138 mmol/L (ref 135–145)
Total Bilirubin: 0.4 mg/dL (ref 0.3–1.2)
Total Protein: 6.1 g/dL — ABNORMAL LOW (ref 6.5–8.1)

## 2019-05-27 LAB — CBC
HCT: 32.3 % — ABNORMAL LOW (ref 36.0–46.0)
Hemoglobin: 10.2 g/dL — ABNORMAL LOW (ref 12.0–15.0)
MCH: 28.2 pg (ref 26.0–34.0)
MCHC: 31.6 g/dL (ref 30.0–36.0)
MCV: 89.2 fL (ref 80.0–100.0)
Platelets: 270 10*3/uL (ref 150–400)
RBC: 3.62 MIL/uL — ABNORMAL LOW (ref 3.87–5.11)
RDW: 16 % — ABNORMAL HIGH (ref 11.5–15.5)
WBC: 13.5 10*3/uL — ABNORMAL HIGH (ref 4.0–10.5)
nRBC: 0 % (ref 0.0–0.2)

## 2019-05-27 LAB — CBC WITH DIFFERENTIAL/PLATELET
Abs Immature Granulocytes: 0.04 10*3/uL (ref 0.00–0.07)
Basophils Absolute: 0 10*3/uL (ref 0.0–0.1)
Basophils Relative: 0 %
Eosinophils Absolute: 0 10*3/uL (ref 0.0–0.5)
Eosinophils Relative: 0 %
HCT: 32.1 % — ABNORMAL LOW (ref 36.0–46.0)
Hemoglobin: 9.9 g/dL — ABNORMAL LOW (ref 12.0–15.0)
Immature Granulocytes: 0 %
Lymphocytes Relative: 6 %
Lymphs Abs: 0.7 10*3/uL (ref 0.7–4.0)
MCH: 27.6 pg (ref 26.0–34.0)
MCHC: 30.8 g/dL (ref 30.0–36.0)
MCV: 89.4 fL (ref 80.0–100.0)
Monocytes Absolute: 0.5 10*3/uL (ref 0.1–1.0)
Monocytes Relative: 4 %
Neutro Abs: 10.5 10*3/uL — ABNORMAL HIGH (ref 1.7–7.7)
Neutrophils Relative %: 90 %
Platelets: 237 10*3/uL (ref 150–400)
RBC: 3.59 MIL/uL — ABNORMAL LOW (ref 3.87–5.11)
RDW: 16.1 % — ABNORMAL HIGH (ref 11.5–15.5)
WBC: 11.7 10*3/uL — ABNORMAL HIGH (ref 4.0–10.5)
nRBC: 0 % (ref 0.0–0.2)

## 2019-05-27 LAB — SARS CORONAVIRUS 2 BY RT PCR (HOSPITAL ORDER, PERFORMED IN ~~LOC~~ HOSPITAL LAB): SARS Coronavirus 2: NEGATIVE

## 2019-05-27 LAB — CREATININE, SERUM
Creatinine, Ser: 3.17 mg/dL — ABNORMAL HIGH (ref 0.44–1.00)
GFR calc Af Amer: 14 mL/min — ABNORMAL LOW (ref >60)
GFR calc non Af Amer: 12 mL/min — ABNORMAL LOW (ref >60)

## 2019-05-27 LAB — TROPONIN I (HIGH SENSITIVITY)
Troponin I (High Sensitivity): 38 ng/L — ABNORMAL HIGH (ref <18)
Troponin I (High Sensitivity): 39 ng/L — ABNORMAL HIGH (ref <18)

## 2019-05-27 MED ORDER — LEVOTHYROXINE SODIUM 100 MCG/5ML IV SOLN: 25.0000 ug | Freq: Every day | INTRAVENOUS | Status: DC

## 2019-05-27 MED ORDER — DEXTROSE IN LACTATED RINGERS 5 % IV SOLN
INTRAVENOUS | Status: DC
  Administered 2019-05-27 – 2019-06-01 (×8): via INTRAVENOUS

## 2019-05-27 MED ORDER — METRONIDAZOLE IN NACL 5-0.79 MG/ML-% IV SOLN
500.0000 mg | Freq: Once | INTRAVENOUS | Status: AC
  Administered 2019-05-27: 500 mg via INTRAVENOUS
  Filled 2019-05-27: qty 100

## 2019-05-27 MED ORDER — LEVOTHYROXINE SODIUM 100 MCG/5ML IV SOLN
37.5000 ug | Freq: Every day | INTRAVENOUS | Status: DC
  Administered 2019-05-28 – 2019-06-06 (×9): 37.5 ug via INTRAVENOUS
  Filled 2019-05-27 (×10): qty 5

## 2019-05-27 MED ORDER — ONDANSETRON HCL 4 MG PO TABS: 4.0000 mg | ORAL_TABLET | Freq: Four times a day (QID) | ORAL | Status: DC | PRN

## 2019-05-27 MED ORDER — ONDANSETRON HCL 4 MG/2ML IJ SOLN: 4.0000 mg | Freq: Four times a day (QID) | INTRAMUSCULAR | Status: DC | PRN

## 2019-05-27 MED ORDER — SODIUM CHLORIDE 0.9 % IV SOLN
1.0000 g | Freq: Once | INTRAVENOUS | Status: AC
  Administered 2019-05-27: 1 g via INTRAVENOUS
  Filled 2019-05-27: qty 10

## 2019-05-27 MED ORDER — PANTOPRAZOLE SODIUM 40 MG IV SOLR
40.0000 mg | Freq: Every day | INTRAVENOUS | Status: DC
  Administered 2019-05-27 – 2019-06-05 (×10): 40 mg via INTRAVENOUS
  Filled 2019-05-27 (×10): qty 40

## 2019-05-27 MED ORDER — ENOXAPARIN SODIUM 30 MG/0.3ML ~~LOC~~ SOLN
30.0000 mg | SUBCUTANEOUS | Status: DC
  Administered 2019-05-27 – 2019-06-07 (×12): 30 mg via SUBCUTANEOUS
  Filled 2019-05-27 (×12): qty 0.3

## 2019-05-27 MED ORDER — ACETAMINOPHEN 325 MG PO TABS
650.0000 mg | ORAL_TABLET | Freq: Four times a day (QID) | ORAL | Status: DC | PRN
  Administered 2019-06-01 – 2019-06-07 (×7): 650 mg via ORAL
  Filled 2019-05-27 (×7): qty 2

## 2019-05-27 MED ORDER — PIPERACILLIN-TAZOBACTAM IN DEX 2-0.25 GM/50ML IV SOLN
2.2500 g | Freq: Three times a day (TID) | INTRAVENOUS | Status: DC
  Administered 2019-05-28 – 2019-06-03 (×19): 2.25 g via INTRAVENOUS
  Filled 2019-05-27 (×22): qty 50

## 2019-05-27 MED ORDER — HYPROMELLOSE (GONIOSCOPIC) 2.5 % OP SOLN: 1.0000 [drp] | Freq: Every day | OPHTHALMIC | Status: DC | PRN

## 2019-05-27 MED ORDER — PIPERACILLIN-TAZOBACTAM 3.375 G IVPB 30 MIN
3.3750 g | Freq: Once | INTRAVENOUS | Status: AC
  Administered 2019-05-27: 3.375 g via INTRAVENOUS
  Filled 2019-05-27: qty 50

## 2019-05-27 MED ORDER — MORPHINE SULFATE (PF) 2 MG/ML IV SOLN
1.0000 mg | INTRAVENOUS | Status: DC | PRN
  Administered 2019-05-28 – 2019-06-01 (×4): 1 mg via INTRAVENOUS
  Filled 2019-05-27 (×6): qty 1

## 2019-05-27 MED ORDER — ACETAMINOPHEN 650 MG RE SUPP
650.0000 mg | Freq: Four times a day (QID) | RECTAL | Status: DC | PRN
  Administered 2019-06-02: 650 mg via RECTAL
  Filled 2019-05-27: qty 1

## 2019-05-27 NOTE — Evaluation (Signed)
Physical Therapy Evaluation Patient Details Name: Mary Guzman MRN: [id number] DOB: [birth year] Today's Date: 05/27/2019   History of Present Illness  Pt is a 85 y.o. F with significant PMH of breast CA, hypertension, osteoporosis. Pt seen and discharged from [facility] ED 9/9 following a fall and subsequent R humerus fracture. Now presents to [facility] ED following syncopal episode. CT head negative for acute abnormality.  Clinical Impression  Pt admitted with above diagnosis. Pt presents with decreased functional mobility secondary to right shoulder pain, generalized weakness, poor sitting/standing balance, decreased endurance. Pt requiring moderate assist to stand, unable to walk due to lower extremity muscle jerking associated with movement. Prior to admission, pt lives alone and has good family support. She is independent with ADL's and is ambulatory using a cane. Pt currently with functional limitations due to the deficits listed below (see PT Problem List). Pt will benefit from skilled PT to increase their independence and safety with mobility to allow discharge to the venue listed below.       Follow Up Recommendations SNF;Supervision/Assistance - 24 hour    Equipment Recommendations  3in1 (PT);Wheelchair (measurements PT);Wheelchair cushion (measurements PT)    Recommendations for Other Services OT consult     Precautions / Restrictions Precautions Precautions: Fall Required Braces or Orthoses: Sling(RUE) Restrictions Weight Bearing Restrictions: Yes RUE Weight Bearing: Non weight bearing      Mobility  Bed Mobility Overal bed mobility: Needs Assistance Bed Mobility: Supine to Sit;Sit to Supine     Supine to sit: Mod assist Sit to supine: Max assist   General bed mobility comments: ModA to support trunk and elevate, maxA to return to supine with guidance for trunk and elevating BLE's   Transfers Overall transfer level: Needs assistance Equipment used: 1 person hand held  assist Transfers: Sit to/from Stand Sit to Stand: Mod assist         General transfer comment: ModA to stand x 2 with knee/foot block, feet tending to slide anteriorly with posterior lean. Increased lower extremity jerking/shaking with attempts to take steps  Ambulation/Gait             General Gait Details: unable  Stairs            Wheelchair Mobility    Modified Rankin (Stroke Patients Only)       Balance Overall balance assessment: Needs assistance Sitting-balance support: Feet supported Sitting balance-Leahy Scale: Fair     Standing balance support: During functional activity;Single extremity supported Standing balance-Leahy Scale: Poor                               Pertinent Vitals/Pain Pain Assessment: Faces Faces Pain Scale: Hurts even more Pain Location: R shoulder Pain Descriptors / Indicators: Grimacing;Guarding Pain Intervention(s): Monitored during session;Patient requesting pain meds-RN notified    Home Living Family/patient expects to be discharged to:: Private residence Living Arrangements: Alone Available Help at Discharge: Family;Available 24 hours/day Type of Home: House         Home Equipment: Kasandra Knudsen - single point      Prior Function Level of Independence: Needs assistance   Gait / Transfers Assistance Needed: ambulatory using cane  ADL's / Homemaking Assistance Needed: independent ADL's, pt daughter assists IADL's        Hand Dominance        Extremity/Trunk Assessment   Upper Extremity Assessment Upper Extremity Assessment: RUE deficits/detail;Generalized weakness RUE Deficits / Details: R humerus fx in  sling, good hand grip    Lower Extremity Assessment Lower Extremity Assessment: Generalized weakness    Cervical / Trunk Assessment Cervical / Trunk Assessment: Kyphotic  Communication   Communication: No difficulties  Cognition Arousal/Alertness: Awake/alert Behavior During Therapy: WFL for  tasks assessed/performed Overall Cognitive Status: Within Functional Limits for tasks assessed                                 General Comments: WFL for basic mobility      General Comments      Exercises     Assessment/Plan    PT Assessment Patient needs continued PT services  PT Problem List Decreased strength;Decreased range of motion;Decreased activity tolerance;Decreased balance;Decreased mobility;Decreased coordination;Decreased cognition       PT Treatment Interventions DME instruction;Gait training;Functional mobility training;Therapeutic activities;Therapeutic exercise;Balance training;Patient/family education    PT Goals (Current goals can be found in the Care Plan section)  Acute Rehab PT Goals Patient Stated Goal: "figure out what's going on." PT Goal Formulation: With patient/family Time For Goal Achievement: 06/10/19 Potential to Achieve Goals: Fair    Frequency Min 3X/week   Barriers to discharge        Co-evaluation               AM-PAC PT "6 Clicks" Mobility  Outcome Measure Help needed turning from your back to your side while in a flat bed without using bedrails?: A Little Help needed moving from lying on your back to sitting on the side of a flat bed without using bedrails?: A Lot Help needed moving to and from a bed to a chair (including a wheelchair)?: A Lot Help needed standing up from a chair using your arms (e.g., wheelchair or bedside chair)?: A Lot Help needed to walk in hospital room?: A Lot Help needed climbing 3-5 steps with a railing? : Total 6 Click Score: 12    End of Session Equipment Utilized During Treatment: Gait belt;Other (comment)(sling) Activity Tolerance: Patient tolerated treatment well Patient left: in bed;with call bell/phone within reach;with family/visitor present Nurse Communication: Mobility status PT Visit Diagnosis: Unsteadiness on feet (R26.81);Pain;Difficulty in walking, not elsewhere  classified (R26.2) Pain - Right/Left: Right Pain - part of body: Shoulder    Time: 3567-0141 PT Time Calculation (min) (ACUTE ONLY): 29 min   Charges:   PT Evaluation $PT Eval Moderate Complexity: 1 Mod PT Treatments $Therapeutic Activity: 8-22 mins        Ellamae Sia, PT, DPT Acute Rehabilitation Services Pager [phone] Office [phone]   Willy Eddy 05/27/2019, 4:57 PM

## 2019-05-27 NOTE — Telephone Encounter (Signed)
Pt arrived [facility] ED.  

## 2019-05-27 NOTE — ED Notes (Signed)
Patient transported to CT 

## 2019-05-27 NOTE — Progress Notes (Signed)
Pharmacy Antibiotic Note  Mary Guzman is a 85 y.o. female admitted on 05/27/2019 with Intra-abdominal infection. CT abdomen pelvis concerning for acute appendicitis. Afebrile, WBC 11.7. Noted renal dysfunction - CKD. Received ceftriaxone 1 g IV once and Flagyl 500 mg IV once in the ED. Pharmacy has been consulted for Zosyn dosing.  Plan: Zosyn 3.375 g IV once over 30 mines  Zosyn 2.25 g IV Q8H Monitor renal function Monitor C/s   Height: 5\' 4"  (162.6 cm) Weight: 127 lb 6.4 oz (57.8 kg) IBW/kg (Calculated) : 54.7  Temp (24hrs), Avg:98.6 F (37 C), Min:98.6 F (37 C), Max:98.6 F (37 C)  Recent Labs  Lab 05/27/19 1246  WBC 11.7*  CREATININE 2.91*    Estimated Creatinine Clearance: 9.8 mL/min (A) (by C-G formula based on SCr of 2.91 mg/dL (H)).    No Known Allergies  Antimicrobials this admission: Ceftriaxone 9/10 >> Flagyl 9/10 >> Zosyn 9/10 >>   Thank you for allowing pharmacy to be a part of this patient's care.   Lorel Monaco, PharmD PGY1 Ambulatory Care Resident [employer] # [phone]

## 2019-05-27 NOTE — Progress Notes (Signed)
Received Pt from ED to 6N27, Alert, oriented, VSS. Oriented to room, placed call light and phone within patient's reach, explain use of bed controls, reports pain with activity, will endorse to night shift RN.

## 2019-05-27 NOTE — Consult Note (Signed)
Reason for Consult: abdominal tenderness Referring Physician: Ignacia Palma is an 85 y.o. female.  HPI: 85 yo female with 2 falls in the past week. She had a right arm fracture recently and is in a sling. She notes today she was found on her back with her arms extended and found by her son. She was dazed for a bit. She denies abdominal pain or nausea or diarrhea.  Past Medical History:  Diagnosis Date   Cancer ([place])    Breast Hx of stage IIB, moderately differentiated with 3 of 8 possible lymph nodes.   DJD (degenerative joint disease)    GERD (gastroesophageal reflux disease)    Hypertension    Osteoporosis    Thyroid disease    Hypothyroidism    Past Surgical History:  Procedure Laterality Date   ANKLE SURGERY     Right   INTRACAPSULAR CATARACT EXTRACTION     MASTECTOMY  1994    Family History  Problem Relation Age of Onset   Alcohol abuse Brother    Asthma Son     Social History:  reports that she has never smoked. Her smokeless tobacco use includes chew. She reports that she does not drink alcohol. No history on file for drug.  Allergies: No Known Allergies  Medications: I have reviewed the patient's current medications.  Results for orders placed or performed during the hospital encounter of 05/27/19 (from the past 48 hour(s))  Comprehensive metabolic panel     Status: Abnormal   Collection Time: 05/27/19 12:46 PM  Result Value Ref Range   Sodium 138 135 - 145 mmol/L   Potassium 3.8 3.5 - 5.1 mmol/L   Chloride 105 98 - 111 mmol/L   CO2 20 (L) 22 - 32 mmol/L   Glucose, Bld 110 (H) 70 - 99 mg/dL   BUN 63 (H) 8 - 23 mg/dL   Creatinine, Ser 2.91 (H) 0.44 - 1.00 mg/dL   Calcium 8.3 (L) 8.9 - 10.3 mg/dL   Total Protein 6.1 (L) 6.5 - 8.1 g/dL   Albumin 2.7 (L) 3.5 - 5.0 g/dL   AST 38 15 - 41 U/L   ALT 25 0 - 44 U/L   Alkaline Phosphatase 228 (H) 38 - 126 U/L   Total Bilirubin 0.4 0.3 - 1.2 mg/dL   GFR calc non Af Amer 13 (L) >60  mL/min   GFR calc Af Amer 15 (L) >60 mL/min   Anion gap 13 5 - 15    Comment: Performed at [facility], [address], [place], [place] [address]  Troponin I (High Sensitivity)     Status: Abnormal   Collection Time: 05/27/19 12:46 PM  Result Value Ref Range   Troponin I (High Sensitivity) 38 (H) <18 ng/L    Comment: (NOTE) Elevated high sensitivity troponin I (hsTnI) values and significant  changes across serial measurements may suggest ACS but many other  chronic and acute conditions are known to elevate hsTnI results.  Refer to the "Links" section for chest pain algorithms and additional  guidance. Performed at [facility], [place] [address], [place], [place] [address]   CBC with Differential     Status: Abnormal   Collection Time: 05/27/19 12:46 PM  Result Value Ref Range   WBC 11.7 (H) 4.0 - 10.5 K/uL   RBC 3.59 (L) 3.87 - 5.11 MIL/uL   Hemoglobin 9.9 (L) 12.0 - 15.0 g/dL   HCT 32.1 (L) 36.0 - 46.0 %  MCV 89.4 80.0 - 100.0 fL   MCH 27.6 26.0 - 34.0 pg   MCHC 30.8 30.0 - 36.0 g/dL   RDW 16.1 (H) 11.5 - 15.5 %   Platelets 237 150 - 400 K/uL   nRBC 0.0 0.0 - 0.2 %   Neutrophils Relative % 90 %   Neutro Abs 10.5 (H) 1.7 - 7.7 K/uL   Lymphocytes Relative 6 %   Lymphs Abs 0.7 0.7 - 4.0 K/uL   Monocytes Relative 4 %   Monocytes Absolute 0.5 0.1 - 1.0 K/uL   Eosinophils Relative 0 %   Eosinophils Absolute 0.0 0.0 - 0.5 K/uL   Basophils Relative 0 %   Basophils Absolute 0.0 0.0 - 0.1 K/uL   Immature Granulocytes 0 %   Abs Immature Granulocytes 0.04 0.00 - 0.07 K/uL    Comment: Performed at [facility] [address], [place], [place] [address]  Troponin I (High Sensitivity)     Status: Abnormal   Collection Time: 05/27/19  3:00 PM  Result Value Ref Range   Troponin I (High Sensitivity) 39 (H) <18 ng/L    Comment: (NOTE) Elevated high sensitivity troponin I (hsTnI) values and significant  changes across serial measurements may suggest ACS but many  other  chronic and acute conditions are known to elevate hsTnI results.  Refer to the "Links" section for chest pain algorithms and additional  guidance. Performed at [facility], [place] [address] [place], [place] [address]     Ct Abdomen Pelvis Wo Contrast  Result Date: 05/27/2019 CLINICAL DATA:  Acute, diffuse abdominal pain following a fall due to a syncopal episode. EXAM: CT ABDOMEN AND PELVIS WITHOUT CONTRAST TECHNIQUE: Multidetector CT imaging of the abdomen and pelvis was performed following the standard protocol without IV contrast. COMPARISON:  None. FINDINGS: Lower chest: Enlarged heart. Dense mitral valve annulus calcifications found coronary artery calcifications. Minimal linear atelectasis or scarring at the right lung base. Hepatobiliary: No focal liver abnormality is seen. No gallstones, gallbladder wall thickening, or biliary dilatation. Pancreas: Moderate diffuse pancreatic atrophy. Spleen: Normal in size without focal abnormality. Adrenals/Urinary Tract: Normal appearing adrenal glands. Multiple rounded, exophytic bilateral renal masses and left renal parapelvic cysts. The exophytic masses range between water density and a maximum of 34 Hounsfield units in density. Unremarkable urinary bladder. No visible ureteral abnormalities. No calculi or hydronephrosis. Stomach/Bowel: Small hiatal hernia. Enlarged appendix containing multiple appendicoliths. The appendix measures up to 12 mm in diameter. Mild adjacent soft tissue stranding and fat induration. Small focal area of adjacent gas with a somewhat linear configuration. The appendix origin is in the upper pelvis on the right. The appendix extends horizontally toward the midline. Unremarkable small bowel and colon. Vascular/Lymphatic: Atheromatous arterial calcifications without aneurysm. No enlarged lymph nodes. Reproductive: Uterus and bilateral adnexa are unremarkable. Other: No free peritoneal fluid or air. Musculoskeletal:  Comminuted left pubic body fracture with sclerosis and corticated fragments. 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 No acute fractures are seen. Left sacral bone island. IMPRESSION: 1. Acute appendicitis with multiple appendicoliths and a small focal, contained perforation without abscess. 2. No evidence of acute abdominal or pelvic injury. 3. Comminuted left pubic body fracture with sclerosis and corticated fragments, compatible with an old fracture with nonunion. 4. Small hiatal hernia. 5. Cardiomegaly. 6. Dense mitral valve annulus and coronary artery atherosclerosis. 7. Multiple bilateral renal masses, as described above. These could represent a combination of simple and complicated cysts and/or solid masses. Electronically Signed   By: Remo Lipps  Joneen Caraway M.D.   On: 05/27/2019 16:23   Ct Head Wo Contrast  Result Date: 05/27/2019 CLINICAL DATA:  Syncopal episode and confusion today. Fell yesterday. EXAM: CT HEAD WITHOUT CONTRAST TECHNIQUE: Contiguous axial images were obtained from the base of the skull through the vertex without intravenous contrast. COMPARISON:  None. FINDINGS: Brain: Moderately enlarged ventricles and subarachnoid spaces. Moderate patchy white matter low density in both cerebral hemispheres. Old left thalamus lacunar infarct. No intracranial hemorrhage, mass lesion or CT evidence of acute infarction. Vascular: No hyperdense vessel or unexpected calcification. Skull: Normal. Negative for fracture or focal lesion. Sinuses/Orbits: Status post bilateral cataract extraction. Unremarkable bones and included paranasal sinuses. Other: None. IMPRESSION: 1. No acute abnormality. 2. Moderate diffuse cerebral and cerebellar atrophy. 3. Moderate chronic small vessel white matter ischemic changes in both cerebral hemispheres. 4. Old left thalamus lacunar infarct. Electronically Signed   By: Claudie Revering M.D.   On: 05/27/2019 16:06   Dg Chest Portable 1 View  Result Date: 05/27/2019 CLINICAL DATA:  Syncope,  shortness of breath, known right humeral fracture EXAM: PORTABLE CHEST 1 VIEW COMPARISON:  Humeral radiograph 1 day prior, chest radiograph 09/22/2017 FINDINGS: Redemonstration of the impacted and angulated fracture involving the surgical neck of the right humerus with some increasing inferior displacement of the humeral head relative to the glenoid, possibly reflecting a shoulder effusion. Elevation of the right hemidiaphragm is similar to comparison radiographs. There streaky atelectatic changes. Mild diffuse airways thickening is present. No consolidation, features of edema, pneumothorax, or effusion. The aorta is calcified. The remaining cardiomediastinal contours are unremarkable. Post mastectomy changes involving the left chest wall and axilla. IMPRESSION: Atelectasis and mild bronchitic changes. Chronic elevation of the right hemidiaphragm. Redemonstration of the impacted and angulated fracture of the right humeral surgical neck with increasing inferior displacement possibly reflecting developing effusion. Electronically Signed   By: Lovena Le M.D.   On: 05/27/2019 14:03   Dg Humerus Right  Result Date: 05/26/2019 CLINICAL DATA:  [address] at home, upper arm pain EXAM: RIGHT HUMERUS - 2+ VIEW COMPARISON:  None. FINDINGS: Acute fracture involving the right humeral neck with impaction and mild posterior angulation of distal fracture fragment. Humeral head appears to project over the glenoid. IMPRESSION: Acute impacted and slightly angulated fracture involving the right humeral neck Electronically Signed   By: Donavan Foil M.D.   On: 05/26/2019 13:40    Review of Systems  Constitutional: Negative for chills and fever.  HENT: Negative for hearing loss.   Eyes: Negative for blurred vision and double vision.  Respiratory: Negative for cough and hemoptysis.   Cardiovascular: Negative for chest pain and palpitations.  Gastrointestinal: Negative for abdominal pain, nausea and vomiting.  Genitourinary:  Negative for dysuria and urgency.  Musculoskeletal: Positive for falls. Negative for myalgias and neck pain.  Skin: Negative for itching and rash.  Neurological: Positive for weakness. Negative for dizziness, tingling and headaches.  Endo/Heme/Allergies: Does not bruise/bleed easily.  Psychiatric/Behavioral: Negative for depression and suicidal ideas.   Blood pressure (!) 151/63, pulse 63, temperature 98.6 F (37 C), temperature source Oral, resp. rate (!) 21, height 5\' 4"  (1.626 m), weight 57.8 kg, SpO2 98 %. Physical Exam  Vitals reviewed. Constitutional: She is oriented to person, place, and time. She appears well-developed and well-nourished.  HENT:  Head: Normocephalic and atraumatic.  Eyes: Pupils are equal, round, and reactive to light. Conjunctivae and EOM are normal.  Neck: Normal range of motion. Neck supple.  Cardiovascular: Normal rate and regular rhythm.  Respiratory: Effort normal and breath sounds normal.  GI: Soft. Bowel sounds are normal. She exhibits no distension. There is abdominal tenderness in the right lower quadrant and suprapubic area.  Musculoskeletal: Normal range of motion.  Neurological: She is alert and oriented to person, place, and time.  Skin: Skin is warm and dry.  Psychiatric: She has a normal mood and affect. Her behavior is normal.    Assessment/Plan: 85 yo female with recent history of falls. On exam she has some slight lower tenderness. WBC 11.8. CT with dilated appendix and concern for perforation. -broad spectrum antibiotics -bowel rest -will continue to follow, do not think surgery is the best option in this woman with multiple other co morbidities who does not appear symptomatic from her CT findings.  Arta Bruce Zakyra [place] 05/27/2019, 4:53 PM

## 2019-05-27 NOTE — Telephone Encounter (Signed)
Pt's daughter Halsey called stating the pt is having garbled speech; it started with the pt gurgling in her throat and burping after breakfast; the pt then got rigid when on her walker, and her body tensed for 15 seconds; the pt had a humerus fx and seen in ED on 05/26/2019; she is groggy, and has take Ultracet  last pm and this morning; the pt is also having weakness; on Tuesday she became sluggish and slow, and she subsequently fell on Wednesday; recommendations made per nurse triage protocol; call transferred to EMS; spoke with operator # [phone]; the pt sees [name], [employer]; will route to office for notification.  Reason for Disposition . [1] SEVERE weakness (i.e., unable to walk or barely able to walk, requires support) AND [2] new onset or worsening  Answer Assessment - Initial Assessment Questions 1. DESCRIPTION: "Describe how you are feeling."     Weak, groggy 2. SEVERITY: "How bad is it?"  "Can you stand and walk?"   - MILD - Feels weak or tired, but does not interfere with work, school or normal activities   - [place] to stand and walk; weakness interferes with work, school, or normal activities   - SEVERE - Unable to stand or walk    severe 3. ONSET:  "When did the weakness begin?"     0930 05/27/2019 4. CAUSE: "What do you think is causing the weakness?"    ? Mini stroke 5. MEDICINES: "Have you recently started a new medicine or had a change in the amount of a medicine?"     Ultracet 37.5-325 6. OTHER SYMPTOMS: "Do you have any other symptoms?" (e.g., chest pain, fever, cough, SOB, vomiting, diarrhea, bleeding, other areas of pain)    Previous Garbled speech, gurgling in throat, rigidity  7. PREGNANCY: "Is there any chance you are pregnant?" "When was your last menstrual period?"   no  Protocols used: WEAKNESS (GENERALIZED) AND FATIGUE-A-AH

## 2019-05-27 NOTE — ED Notes (Signed)
ED Provider at bedside. 

## 2019-05-27 NOTE — ED Triage Notes (Signed)
Patient arrived via EMS post witness syncopal episode by family. Reports feeling dizzy this morning. Treated at [facility] yesterday post fall, with fracture to right humerus. Alert and oriented. Denies headache, shortness of breath or chest pain.

## 2019-05-27 NOTE — ED Notes (Signed)
Pt to CT at this time.

## 2019-05-27 NOTE — ED Provider Notes (Signed)
[facility] DE[facility] Provider Note   CSN: [id number] Arrival date & time: 05/27/19  1201     History   Chief Complaint Chief Complaint  Patient presents with   Loss of Consciousness   Fatigue    HPI Mary Guzman is a 85 y.o. female.  Presents emerged department after syncopal episode today.  Witnessed by family, patient had been reporting feeling lightheaded this morning.  Review telephone note.   Further history obtained from daughter, single episode witnessed by her brother, patient was sitting in recliner, had personally 15 seconds of full body stiffening, unresponsiveness, then had full return of consciousness, no bladder or bowel incontinence noted.  No post episode confusion.  Reviewed chart from yesterday visit, fall, right proximal humerus fracture.    HPI  Past Medical History:  Diagnosis Date   Cancer ([place])    Breast Hx of stage IIB, moderately differentiated with 3 of 8 possible lymph nodes.   DJD (degenerative joint disease)    GERD (gastroesophageal reflux disease)    Hypertension    Osteoporosis    Thyroid disease    Hypothyroidism    Patient Active Problem List   Diagnosis Date Noted   Deficiency anemia 06/02/2018   Chronic kidney disease (CKD), stage IV (severe) ([place]) 06/02/2018   Iron deficiency anemia due to chronic blood loss 06/01/2018   Anemia in chronic kidney disease 07/02/2016   CKD (chronic kidney disease), stage III ([place]) 60/63/0160   Chronic systolic CHF (congestive heart failure) ([place]) 07/16/2015   Acute on chronic systolic heart failure ([place]) 06/30/2015   Bilateral leg edema 07/27/2014   Chronic kidney disease 06/07/2013   Essential hypertension 07/03/2009   Hypothyroidism 03/16/2007   GERD 03/16/2007   Osteoarthritis 03/16/2007   Osteoporosis 03/16/2007   BREAST CANCER, HX OF 03/16/2007    Past Surgical History:  Procedure Laterality Date   ANKLE SURGERY     Right    INTRACAPSULAR CATARACT EXTRACTION     MASTECTOMY  1994     OB History    Gravida  2   Para  2   Term      Preterm      AB  0   Living        SAB      TAB      Ectopic      Multiple      Live Births               Home Medications    Prior to Admission medications   Medication Sig Start Date End Date Taking? Authorizing Provider  acetaminophen (TYLENOL) 500 MG tablet Take 500 mg by mouth every 6 (six) hours as needed for mild pain.     [provider]  amLODipine (NORVASC) 10 MG tablet TAKE 1 TABLET(10 MG) BY MOUTH DAILY 03/22/19   Larey Dresser, MD  aspirin EC 81 MG tablet Take 1 tablet (81 mg total) by mouth daily. 07/29/16   Larey Dresser, MD  Calcium Carb-Cholecalciferol (CALCIUM 600+D) 600-800 MG-UNIT TABS Take 1 tablet by mouth 2 (two) times daily.    [provider]  carboxymethylcellulose (REFRESH PLUS) 0.5 % SOLN Place 1 drop into both eyes 3 (three) times daily as needed (dry eye).    [provider]  Cholecalciferol (VITAMIN D3) 5000 UNITS CAPS Take 1 capsule by mouth daily.    [provider]  famotidine-calcium carbonate-magnesium hydroxide (PEPCID COMPLETE) 10-800-165 MG chewable tablet Chew 1 tablet by  mouth daily as needed.    [provider]  furosemide (LASIX) 40 MG tablet TAKE 1 TABLET BY MOUTH EVERY DAY. 12/21/18   Larey Dresser, MD  levothyroxine (SYNTHROID, LEVOTHROID) 75 MCG tablet Take 1 tablet (75 mcg total) by mouth daily before breakfast. 09/22/18   Larey Dresser, MD  loperamide (IMODIUM A-D) 2 MG tablet Take 2 mg by mouth 4 (four) times daily as needed for diarrhea or loose stools.    [provider]  metoprolol succinate (TOPROL-XL) 25 MG 24 hr tablet TAKE 1 TABLET(25 MG) BY MOUTH TWICE DAILY 11/03/18   Larey Dresser, MD  Multiple Vitamins-Minerals (PRESERVISION AREDS 2 PO) Take 2 tablets by mouth daily.     [provider]  Probiotic Product (PROBIOTIC ADVANCED PO)  Take 500 mg by mouth daily.     [provider]  traMADol-acetaminophen (ULTRACET) 37.5-325 MG tablet Take 1 tablet by mouth every 6 (six) hours as needed. 05/26/19   Daleen Bo, MD    Family History Family History  Problem Relation Age of Onset   Alcohol abuse Brother    Asthma Son     Social History Social History   Tobacco Use   Smoking status: Never Smoker   Smokeless tobacco: Current User    Types: Chew  Substance Use Topics   Alcohol use: No   Drug use: Not on file     Allergies   Patient has no known allergies.   Review of Systems Review of Systems  Constitutional: Negative for chills and fever.  HENT: Negative for ear pain and sore throat.   Eyes: Negative for pain and visual disturbance.  Respiratory: Negative for cough and shortness of breath.   Cardiovascular: Negative for chest pain and palpitations.  Gastrointestinal: Negative for abdominal pain and vomiting.  Genitourinary: Negative for dysuria and hematuria.  Musculoskeletal: Negative for arthralgias and back pain.  Skin: Negative for color change and rash.  Neurological: Positive for syncope. Negative for seizures.  All other systems reviewed and are negative.    Physical Exam Updated Vital Signs BP (!) 151/63    Pulse 63    Temp 98.6 F (37 C) (Oral)    Resp (!) 21    Ht 5\' 4"  (1.626 m)    Wt 57.8 kg    SpO2 98%    BMI 21.87 kg/m   Physical Exam Vitals signs and nursing note reviewed.  Constitutional:      General: She is not in acute distress.    Appearance: She is well-developed.  HENT:     Head: Normocephalic and atraumatic.  Eyes:     Conjunctiva/sclera: Conjunctivae normal.  Neck:     Musculoskeletal: Neck supple.  Cardiovascular:     Rate and Rhythm: Normal rate and regular rhythm.     Heart sounds: No murmur.  Pulmonary:     Effort: Pulmonary effort is normal. No respiratory distress.     Breath sounds: Normal breath sounds.  Abdominal:     Palpations:  Abdomen is soft.     Comments: Soft but noted generlized TTP throughout abdomen, no guarding or rebound  Musculoskeletal:     Comments: Right upper arm tenderness, mild ecchymosis, and sling, distal sensation, motor intact LUE: No tenderness palpation, deformity noted throughout extremity RLE: No tenderness palpation, deformity noted throughout extremity LLE: No tenderness palpation, deformity noted throughout extremity  Skin:    General: Skin is warm and dry.  Neurological:     General: No focal deficit  present.     Mental Status: She is alert and oriented to person, place, and time.     Comments: Cranial nerves II through XII intact, speech clear, normal finger-nose-finger, 5/5 strength in bilateral upper and lower extremities, sensation to light touch intact in all 4 extremities      ED Treatments / Results  Labs (all labs ordered are listed, but only abnormal results are displayed) Labs Reviewed  URINALYSIS, ROUTINE W REFLEX MICROSCOPIC    EKG EKG Interpretation  Date/Time:  Thursday May 27 2019 12:02:25 EDT Ventricular Rate:  72 PR Interval:    QRS Duration: 155 QT Interval:  470 QTC Calculation: 515 R Axis:   -57 Text Interpretation:  Sinus rhythm Left bundle branch block when compared to ECG on 05/19/2018, no signfiicant chang appreciated no STEMI Confirmed by Madalyn Rob [phone]) on 05/27/2019 12:06:24 PM   Radiology Dg Humerus Right  Result Date: 05/26/2019 CLINICAL DATA:  [address] at home, upper arm pain EXAM: RIGHT HUMERUS - 2+ VIEW COMPARISON:  None. FINDINGS: Acute fracture involving the right humeral neck with impaction and mild posterior angulation of distal fracture fragment. Humeral head appears to project over the glenoid. IMPRESSION: Acute impacted and slightly angulated fracture involving the right humeral neck Electronically Signed   By: Donavan Foil M.D.   On: 05/26/2019 13:40    Procedures Procedures (including critical care time)  Medications  Ordered in ED Medications - No data to display   Initial Impression / Assessment and Plan / ED Course  I have reviewed the triage vital signs and the nursing notes.  Pertinent labs & imaging results that were available during my care of the patient were reviewed by me and considered in my medical decision making (see chart for details).  Clinical Course as of May 26 1638  Thu May 27, 2019  1304 Spoke with Mary Guzman, patient's daughter listed as emergency contact, she is coming to ER soon   [RD]  1618 Discussed with neurology - recommends outpatient neuro follow up, don't need in patient EEG or MRI   [RD]  73 Noted CT abdomen pelvis read, concern for appendicitis, will start antibiotics, consult general surgery   [RD]    Clinical Course User Index [RD] Lucrezia Starch, MD       85 year old lady presented to the emergency department after syncopal versus seizure-like episode.  Here patient is noted be well-appearing with normal vital signs.  Physical exam was unremarkable except for her known right proximal humerus fracture as well as some generalized abdominal tenderness, worse in right lower quadrant.  Syncope/seizure work-up was relatively unremarkable, discussed case with neurology, do not feel patient needs further seizure work-up.  CT head negative for acute traumatic pathology.  CT abdomen pelvis that was concerning for acute appendicitis.  Consulted general surgery, started antibiotics.  While awaiting assessment by general surgery, patient signed out to Dr. Amedeo Plenty.  Patient and daughter updated on plans. Will be admitted to general surgery likely, if not then hospitalist.   Final Clinical Impressions(s) / ED Diagnoses   Final diagnoses:  Acute appendicitis, unspecified acute appendicitis type  Syncope, unspecified syncope type    ED Discharge Orders    None       Lucrezia Starch, MD 05/27/19 1643

## 2019-05-27 NOTE — ED Triage Notes (Signed)
Patient arrived via EMS post witnessed syncopal episode by family. Reports feeling dizzy this morning this morning. Treated at [facility] yesterday post fall with fracture to right humerus. Alert and oriented to person, place and situation. Denies headache, shortness of breath or chest pain.

## 2019-05-27 NOTE — Plan of Care (Signed)
  Problem: Health Behavior/Discharge Planning: Goal: Ability to manage health-related needs will improve Outcome: Progressing   

## 2019-05-27 NOTE — Telephone Encounter (Signed)
Copied from [place] [id number]. Topic: General - Other >> May 27, 2019  8:18 AM Carolyn Stare wrote: [name] pt daughter call to say pt fell and was taken to [facility] and she has humerus fracture in right arm and was put in a sling, daughter said pt is weak and can not walk , would like a call back

## 2019-05-27 NOTE — H&P (Addendum)
History and Physical    KAYSE PUCCINI IZT:[id number] DOB: February 25, 1921 DOA: 05/27/2019  PCP: [name], NP   Patient coming from: Home   Chief Complaint: Syncope and abdominal pain.   HPI: Mary Guzman is a 85 y.o. female with medical history significant of hypertension and hypothyroidism.  Patient reports 2 to 3-day history of lower abdominal pain, moderate in intensity, dull to colicky in nature, no improving or worsening factors, associated with chills but no frank fevers.  No nausea, no vomiting and no diarrhea. 24 hours prior to hospitalization she suffered a syncope episode while standing in the bathroom, she landed on the right side and suffered a right humeral fracture.  She was seen at the emergency department, upper extremity placed on sling and and discharged back home.  She arrived late night into her home, she continued to have abdominal pain but was able to eat.  She slept in a recliner.  This morning she was helped to get into her bed, while in bed she had an episode of unresponsiveness, her eyes were wide open, she was tonic, but no clonic activity, no tongue biting no loss of continence.  She recovered her consciousness fairly rapidly, EMS was called and she was brought back to the hospital.  ED Course: Patient was found hemodynamically stable, extensive work-up revealed appendicitis.  Surgery was consulted with recommendations to conservative management, per medicine service.  Review of Systems:  1. General: positive chills, no fevers, no weight gain or weight loss 2. ENT: No runny nose or sore throat, no hearing disturbances 3. Pulmonary: No dyspnea, cough, wheezing, or hemoptysis 4. Cardiovascular: No angina, claudication, lower extremity edema, pnd or orthopnea 5. Gastrointestinal: No nausea or vomiting, no diarrhea or constipation. Positive abdominal pain as mentioned in the HPI.  6. Hematology: No easy bruisability or frequent infections 7. Urology: No dysuria,  hematuria or increased urinary frequency 8. Dermatology: No rashes. 9. Neurology: No seizures or paresthesias 10. 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   DJD (degenerative joint disease)    GERD (gastroesophageal reflux disease)    Hypertension    Osteoporosis    Thyroid disease    Hypothyroidism    Past Surgical History:  Procedure Laterality Date   ANKLE SURGERY     Right   INTRACAPSULAR CATARACT EXTRACTION     MASTECTOMY  1994     reports that she has never smoked. Her smokeless tobacco use includes chew. She reports that she does not drink alcohol. No history on file for drug.  No Known Allergies  Family History  Problem Relation Age of Onset   Alcohol abuse Brother    Asthma Son      Prior to Admission medications   Medication Sig Start Date End Date Taking? Authorizing Provider  acetaminophen (TYLENOL) 500 MG tablet Take 500 mg by mouth every 6 (six) hours as needed for mild pain.    Yes [provider]  amLODipine (NORVASC) 10 MG tablet TAKE 1 TABLET(10 MG) BY MOUTH DAILY Patient taking differently: Take 10 mg by mouth daily.  03/22/19  Yes Larey Dresser, MD  aspirin EC 81 MG tablet Take 1 tablet (81 mg total) by mouth daily. 07/29/16  Yes Larey Dresser, MD  Calcium Carb-Cholecalciferol (CALCIUM 600+D) 600-800 MG-UNIT TABS Take 1 tablet by mouth 2 (two) times  daily.   Yes [provider]  carboxymethylcellulose (REFRESH PLUS) 0.5 % SOLN Place 1 drop into both eyes daily as needed (dry eye).    Yes [provider]  Cholecalciferol (VITAMIN D3) 5000 UNITS CAPS Take 1 capsule by mouth daily.   Yes [provider]  famotidine-calcium carbonate-magnesium hydroxide (PEPCID COMPLETE) 10-800-165 MG chewable tablet Chew 1 tablet by mouth daily as needed.   Yes [provider]  furosemide (LASIX) 40 MG tablet TAKE 1 TABLET BY MOUTH EVERY DAY. Patient taking differently: Take 40 mg by mouth daily.  12/21/18  Yes Larey Dresser, MD  levothyroxine (SYNTHROID, LEVOTHROID) 75 MCG tablet Take 1 tablet (75 mcg total) by mouth daily before breakfast. 09/22/18  Yes Larey Dresser, MD  loperamide (IMODIUM A-D) 2 MG tablet Take 2 mg by mouth 4 (four) times daily as needed for diarrhea or loose stools.   Yes [provider]  metoprolol succinate (TOPROL-XL) 25 MG 24 hr tablet TAKE 1 TABLET(25 MG) BY MOUTH TWICE DAILY Patient taking differently: Take 25 mg by mouth 2 (two) times daily.  11/03/18  Yes Larey Dresser, MD  Multiple Vitamins-Minerals (PRESERVISION AREDS 2 PO) Take 2 tablets by mouth daily.    Yes [provider]  traMADol-acetaminophen (ULTRACET) 37.5-325 MG tablet Take 1 tablet by mouth every 6 (six) hours as needed. 05/26/19  Yes Daleen Bo, MD    Physical Exam: Vitals:   05/27/19 1206 05/27/19 1207 05/27/19 1215 05/27/19 1216  BP: (!) 146/74  (!) 151/63 (!) 151/63  Pulse: 72   63  Resp: 16  (!) 26 (!) 21  Temp:      TempSrc:      SpO2: 99%   98%  Weight:  57.8 kg    Height:  5\' 4"  (1.626 m)      Vitals:   05/27/19 1206 05/27/19 1207 05/27/19 1215 05/27/19 1216  BP: (!) 146/74  (!) 151/63 (!) 151/63  Pulse: 72   63  Resp: 16  (!) 26 (!) 21  Temp:      TempSrc:      SpO2: 99%   98%  Weight:  57.8 kg    Height:  5\' 4"  (1.626 m)     General: deconditioned and ill looking appearing Neurology: Awake and alert, non focal Head and Neck. Head normocephalic. Neck supple with no adenopathy or thyromegaly.   E ENT: mild pallor, no icterus, oral mucosa dry. Cardiovascular: No JVD. S1-S2 present, rhythmic, no gallops, rubs, or murmurs. No lower extremity edema. Pulmonary: positive breath sounds bilaterally, adequate air movement, no wheezing, rhonchi or rales. Gastrointestinal. Abdomen mild distended, tender to superficial  and deep palpation with no organomegaly, mild rebound but not guarding Skin. No rashes Musculoskeletal: no joint deformities    Labs on Admission: I have personally reviewed following labs and imaging studies  CBC: Recent Labs  Lab 05/27/19 1246  WBC 11.7*  NEUTROABS 10.5*  HGB 9.9*  HCT 32.1*  MCV 89.4  PLT 161   Basic Metabolic Panel: Recent Labs  Lab 05/27/19 1246  NA 138  K 3.8  CL 105  CO2 20*  GLUCOSE 110*  BUN 63*  CREATININE 2.91*  CALCIUM 8.3*   GFR: Estimated Creatinine Clearance: 9.8 mL/min (A) (by C-G formula based on SCr of 2.91 mg/dL (H)). Liver Function Tests: Recent Labs  Lab 05/27/19 1246  AST 38  ALT 25  ALKPHOS 228*  BILITOT 0.4  PROT 6.1*  ALBUMIN 2.7*   No  results for input(s): LIPASE, AMYLASE in the last 168 hours. No results for input(s): AMMONIA in the last 168 hours. Coagulation Profile: No results for input(s): INR, PROTIME in the last 168 hours. Cardiac Enzymes: No results for input(s): CKTOTAL, CKMB, CKMBINDEX, TROPONINI in the last 168 hours. BNP (last 3 results) No results for input(s): PROBNP in the last 8760 hours. HbA1C: No results for input(s): HGBA1C in the last 72 hours. CBG: No results for input(s): GLUCAP in the last 168 hours. Lipid Profile: No results for input(s): CHOL, HDL, LDLCALC, TRIG, CHOLHDL, LDLDIRECT in the last 72 hours. Thyroid Function Tests: No results for input(s): TSH, T4TOTAL, FREET4, T3FREE, THYROIDAB in the last 72 hours. Anemia Panel: No results for input(s): VITAMINB12, FOLATE, FERRITIN, TIBC, IRON, RETICCTPCT in the last 72 hours. Urine analysis:    Component Value Date/Time   COLORURINE STRAW (A) 05/19/2018 1908   APPEARANCEUR CLEAR 05/19/2018 1908   LABSPEC 1.009 05/19/2018 1908   PHURINE 7.0 05/19/2018 1908   GLUCOSEU NEGATIVE 05/19/2018 1908   HGBUR NEGATIVE 05/19/2018 1908   BILIRUBINUR NEGATIVE 05/19/2018 1908   KETONESUR NEGATIVE 05/19/2018 1908   PROTEINUR NEGATIVE 05/19/2018  1908   NITRITE NEGATIVE 05/19/2018 1908   LEUKOCYTESUR NEGATIVE 05/19/2018 1908    Radiological Exams on Admission: Ct Abdomen Pelvis Wo Contrast  Result Date: 05/27/2019 CLINICAL DATA:  Acute, diffuse abdominal pain following a fall due to a syncopal episode. EXAM: CT ABDOMEN AND PELVIS WITHOUT CONTRAST TECHNIQUE: Multidetector CT imaging of the abdomen and pelvis was performed following the standard protocol without IV contrast. COMPARISON:  None. FINDINGS: Lower chest: Enlarged heart. Dense mitral valve annulus calcifications found coronary artery calcifications. Minimal linear atelectasis or scarring at the right lung base. Hepatobiliary: No focal liver abnormality is seen. No gallstones, gallbladder wall thickening, or biliary dilatation. Pancreas: Moderate diffuse pancreatic atrophy. Spleen: Normal in size without focal abnormality. Adrenals/Urinary Tract: Normal appearing adrenal glands. Multiple rounded, exophytic bilateral renal masses and left renal parapelvic cysts. The exophytic masses range between water density and a maximum of 34 Hounsfield units in density. Unremarkable urinary bladder. No visible ureteral abnormalities. No calculi or hydronephrosis. Stomach/Bowel: Small hiatal hernia. Enlarged appendix containing multiple appendicoliths. The appendix measures up to 12 mm in diameter. Mild adjacent soft tissue stranding and fat induration. Small focal area of adjacent gas with a somewhat linear configuration. The appendix origin is in the upper pelvis on the right. The appendix extends horizontally toward the midline. Unremarkable small bowel and colon. Vascular/Lymphatic: Atheromatous arterial calcifications without aneurysm. No enlarged lymph nodes. Reproductive: Uterus and bilateral adnexa are unremarkable. Other: No free peritoneal fluid or air. Musculoskeletal: Comminuted left pubic body fracture with sclerosis and corticated fragments. 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 No acute fractures  are seen. Left sacral bone island. IMPRESSION: 1. Acute appendicitis with multiple appendicoliths and a small focal, contained perforation without abscess. 2. No evidence of acute abdominal or pelvic injury. 3. Comminuted left pubic body fracture with sclerosis and corticated fragments, compatible with an old fracture with nonunion. 4. Small hiatal hernia. 5. Cardiomegaly. 6. Dense mitral valve annulus and coronary artery atherosclerosis. 7. Multiple bilateral renal masses, as described above. These could represent a combination of simple and complicated cysts and/or solid masses. Electronically Signed   By: Claudie Revering M.D.   On: 05/27/2019 16:23   Ct Head Wo Contrast  Result Date: 05/27/2019 CLINICAL DATA:  Syncopal episode and confusion today. Fell yesterday. EXAM: CT HEAD WITHOUT CONTRAST TECHNIQUE: Contiguous axial images were obtained from the  base of the skull through the vertex without intravenous contrast. COMPARISON:  None. FINDINGS: Brain: Moderately enlarged ventricles and subarachnoid spaces. Moderate patchy white matter low density in both cerebral hemispheres. Old left thalamus lacunar infarct. No intracranial hemorrhage, mass lesion or CT evidence of acute infarction. Vascular: No hyperdense vessel or unexpected calcification. Skull: Normal. Negative for fracture or focal lesion. Sinuses/Orbits: Status post bilateral cataract extraction. Unremarkable bones and included paranasal sinuses. Other: None. IMPRESSION: 1. No acute abnormality. 2. Moderate diffuse cerebral and cerebellar atrophy. 3. Moderate chronic small vessel white matter ischemic changes in both cerebral hemispheres. 4. Old left thalamus lacunar infarct. Electronically Signed   By: Claudie Revering M.D.   On: 05/27/2019 16:06   Dg Chest Portable 1 View  Result Date: 05/27/2019 CLINICAL DATA:  Syncope, shortness of breath, known right humeral fracture EXAM: PORTABLE CHEST 1 VIEW COMPARISON:  Humeral radiograph 1 day prior, chest  radiograph 09/22/2017 FINDINGS: Redemonstration of the impacted and angulated fracture involving the surgical neck of the right humerus with some increasing inferior displacement of the humeral head relative to the glenoid, possibly reflecting a shoulder effusion. Elevation of the right hemidiaphragm is similar to comparison radiographs. There streaky atelectatic changes. Mild diffuse airways thickening is present. No consolidation, features of edema, pneumothorax, or effusion. The aorta is calcified. The remaining cardiomediastinal contours are unremarkable. Post mastectomy changes involving the left chest wall and axilla. IMPRESSION: Atelectasis and mild bronchitic changes. Chronic elevation of the right hemidiaphragm. Redemonstration of the impacted and angulated fracture of the right humeral surgical neck with increasing inferior displacement possibly reflecting developing effusion. Electronically Signed   By: Lovena Le M.D.   On: 05/27/2019 14:03   Dg Humerus Right  Result Date: 05/26/2019 CLINICAL DATA:  [place] at home, upper arm pain EXAM: RIGHT HUMERUS - 2+ VIEW COMPARISON:  None. FINDINGS: Acute fracture involving the right humeral neck with impaction and mild posterior angulation of distal fracture fragment. Humeral head appears to project over the glenoid. IMPRESSION: Acute impacted and slightly angulated fracture involving the right humeral neck Electronically Signed   By: Donavan Foil M.D.   On: 05/26/2019 13:40    EKG: Independently reviewed.  72 bpm, left axis deviation, left bundle branch block, sinus rhythm, poor R wave progression, Q waves in V1 and V2, no significant ST segment changes, negative T waves lead I, aVL and V6  Assessment/Plan Principal Problem:   Appendicitis Active Problems:   Hypothyroidism   Essential hypertension   GERD   Osteoarthritis   Chronic systolic CHF (congestive heart failure) (HCC)   CKD (chronic kidney disease), stage III (HCC)   Right humeral  fracture   85 year old female who presents with 2 to 3-day history of abdominal pain, lower quadrant, colicky and dull in nature, associated with chills and syncope.  On her initial physical examination blood pressure 133/62, heart rate 71, respiratory rate 23, oxygen saturation 98% on room air.  Mild pallor, dry mucous membranes, lungs clear to auscultation bilaterally, heart S1-S2 present and rhythmic, abdomen is tender to superficial deep palpation, mild rebound no guarding, no lower extremity edema. Sodium 138, potassium 3.8, chloride 105, bicarb 20, glucose 110, BUN 63, creatinine 2.91, troponin 38, white count 11.7, hemoglobin 9.9, hematocrit 32.1, platelets 237.  Urinalysis negative for infection.  Head CT with no acute changes.  CT of the abdomen with acute appendicitis with multiple appendicoliths and small focal a contained perforation without abscess.  Comminuted left pubic body fracture with sclerosis and corticated fragments, compatible  with old fracture with nonunion.  Chest radiograph with right hemidiaphragm elevation.  Patient was admitted to the hospital with a working diagnosis of acute appendicitis.  1.  Acute appendicitis.  Patient has been evaluated by the surgical service, recommendations to continue conservative medical therapy.  Admit patient to the medical ward, continue supportive intravenous fluids with balanced electrolyte solutions and dextrose at 75 mL/h, antiacid therapy with pantoprazole intravenously, as needed analgesics and antiemetics.  Broad spectrum antibiotic therapy with intravenous Zosyn.  Nothing by mouth.  Likely syncope triggered by acute appendicitis in an elderly patient.  Continue neurochecks pain protocol.  2.  Acute kidney injury on chronic kidney disease stage III.  Likely prerenal, continue hydration with balance electrolyte solutions, lactated Ringer's and dextrose at 75 ml per hour, follow-up kidney function in the morning, avoid hypotension  nephrotoxic agents.  2.  Chronic systolic heart failure/ mild troponin elevation..  No signs of acute decompensation, hold furosemide, and metoprolol, close follow-up on volume status.  Continue hydration with intravenous fluids.  Troponin elevation likely due to demand ischemia.  Continue supportive therapy.  3.  Hypertension.  Will hold on antihypertensive agents including amlodipine, furosemide and metoprolol.  4.  Hypothyroidism.  Will change levothyroxine to intravenous.  5.  Right proximal humeral fracture.  Impacted and slightly angulated fracture, continue pain control and immobilization.  DVT prophylaxis: enoxaparin  Code Status: full  Family Communication: I spoke with patient's daughter at the bedside and all questions were addressed.   Disposition Plan: inpatient   Consults called: none   Admission status: Inpatient.     Sebastion Jun Gerome Apley MD [facility]   05/27/2019, 5:09 PM

## 2019-05-28 ENCOUNTER — Other Ambulatory Visit: Payer: Self-pay

## 2019-05-28 DIAGNOSIS — K3532 Acute appendicitis with perforation and localized peritonitis, without abscess: Secondary | ICD-10-CM

## 2019-05-28 LAB — URINALYSIS, ROUTINE W REFLEX MICROSCOPIC
Bacteria, UA: NONE SEEN
Bilirubin Urine: NEGATIVE
Glucose, UA: NEGATIVE mg/dL
Hgb urine dipstick: NEGATIVE
Ketones, ur: NEGATIVE mg/dL
Nitrite: NEGATIVE
Protein, ur: NEGATIVE mg/dL
Specific Gravity, Urine: 1.015 (ref 1.005–1.030)
pH: 5 (ref 5.0–8.0)

## 2019-05-28 LAB — CBC
HCT: 28.7 % — ABNORMAL LOW (ref 36.0–46.0)
Hemoglobin: 9.1 g/dL — ABNORMAL LOW (ref 12.0–15.0)
MCH: 27.9 pg (ref 26.0–34.0)
MCHC: 31.7 g/dL (ref 30.0–36.0)
MCV: 88 fL (ref 80.0–100.0)
Platelets: 243 10*3/uL (ref 150–400)
RBC: 3.26 MIL/uL — ABNORMAL LOW (ref 3.87–5.11)
RDW: 16 % — ABNORMAL HIGH (ref 11.5–15.5)
WBC: 12.6 10*3/uL — ABNORMAL HIGH (ref 4.0–10.5)
nRBC: 0 % (ref 0.0–0.2)

## 2019-05-28 LAB — BASIC METABOLIC PANEL
Anion gap: 14 (ref 5–15)
BUN: 71 mg/dL — ABNORMAL HIGH (ref 8–23)
CO2: 18 mmol/L — ABNORMAL LOW (ref 22–32)
Calcium: 8.2 mg/dL — ABNORMAL LOW (ref 8.9–10.3)
Chloride: 106 mmol/L (ref 98–111)
Creatinine, Ser: 3.09 mg/dL — ABNORMAL HIGH (ref 0.44–1.00)
GFR calc Af Amer: 14 mL/min — ABNORMAL LOW (ref >60)
GFR calc non Af Amer: 12 mL/min — ABNORMAL LOW (ref >60)
Glucose, Bld: 125 mg/dL — ABNORMAL HIGH (ref 70–99)
Potassium: 3.9 mmol/L (ref 3.5–5.1)
Sodium: 138 mmol/L (ref 135–145)

## 2019-05-28 MED ORDER — ORAL CARE MOUTH RINSE
15.0000 mL | Freq: Two times a day (BID) | OROMUCOSAL | Status: DC
  Administered 2019-05-28 – 2019-06-08 (×20): 15 mL via OROMUCOSAL

## 2019-05-28 NOTE — Progress Notes (Signed)
    CC: Syncope and abdominal pain  Subjective: Frail 85 year old, she is still extremely tender.  Entire abdomen is tender.  Objective: Vital signs in last 24 hours: Temp:  [97.8 F (36.6 C)-98.6 F (37 C)] 98.4 F (36.9 C) (09/11 0501) Pulse Rate:  [63-81] 81 (09/11 0501) Resp:  [16-26] 17 (09/11 0501) BP: (109-151)/(49-97) 112/49 (09/11 0501) SpO2:  [93 %-100 %] 93 % (09/11 0501) Weight:  [57.8 kg-58 kg] 58 kg (09/11 0500) Last BM Date: 05/26/19 NPO Urine 200 NO IV fluid recorded Afebrile, VSS Creatinine 3.91 >> 3.09(9/11)  Intake/Output from previous day: 09/10 0701 - 09/11 0700 In: 0  Out: 200 [Urine:200] Intake/Output this shift: No intake/output data recorded.  General appearance: alert, cooperative, no distress and Extremely frail and has significant abdominal tenderness. Resp: clear to auscultation bilaterally GI: She is extremely tender.  She is not very distended.  No bowel sounds.  Lab Results:  Recent Labs    05/27/19 1922 05/28/19 0255  WBC 13.5* 12.6*  HGB 10.2* 9.1*  HCT 32.3* 28.7*  PLT 270 243    BMET Recent Labs    05/27/19 1246 05/27/19 1922 05/28/19 0255  NA 138  --  138  K 3.8  --  3.9  CL 105  --  106  CO2 20*  --  18*  GLUCOSE 110*  --  125*  BUN 63*  --  71*  CREATININE 2.91* 3.17* 3.09*  CALCIUM 8.3*  --  8.2*   PT/INR No results for input(s): LABPROT, INR in the last 72 hours.  Recent Labs  Lab 05/27/19 1246  AST 38  ALT 25  ALKPHOS 228*  BILITOT 0.4  PROT 6.1*  ALBUMIN 2.7*     Lipase  No results found for: LIPASE   Medications: . enoxaparin (LOVENOX) injection  30 mg Subcutaneous Q24H  . levothyroxine  37.5 mcg Intravenous Daily  . mouth rinse  15 mL Mouth Rinse BID  . pantoprazole (PROTONIX) IV  40 mg Intravenous QHS   . dextrose 5% lactated ringers 75 mL/hr at 05/27/19 2009  . piperacillin-tazobactam (ZOSYN)  IV 2.25 g (05/28/19 0138)    Assessment/Plan Syncope AKI with CKD stage III -  Creatinine 3.91 >> 7.34(2/87) Chronic systolic heart failure/mild troponin elevation Hypertension Hypothyroid Hx breast cancer  Fall with right proximal humeral fracture Comminuted left pubic body fracture with sclerosis compatible with old fracture with nonunion Multiple rounded, exophytic bilateral renal masses   Appendicitis with possible perforation  - Acute appendicitis with multiple appendicoliths and a small     focal, contained perforation without abscess   - Leukocytosis 11.7(9/10)>>13.5>>12.6(9/11) FEN:IV fluids/NPO ID:  Zosyn 9/10 >> day 2 DVT:  Lovenox Follow up:  TBD  Plan: We are planning medical management.  Continue antibiotics.  Ice chips only.  We will continue to follow closely.    LOS: 1 day    Zanetta Dehaan 05/28/2019 [phone]

## 2019-05-28 NOTE — Evaluation (Signed)
Occupational Therapy Evaluation Patient Details Name: Mary Guzman MRN: [id number] DOB: 1921-08-20 Today's Date: 05/28/2019    History of Present Illness Pt is a 85 y.o. F with significant PMH of breast CA, hypertension, osteoporosis. Pt seen and discharged from [facility] ED 9/9 following a fall and subsequent R humerus fracture. Now presents to [facility] ED following syncopal episode. CT head negative for acute abnormality.   Clinical Impression   Pt with decline in function and safety with ADLs and ADL mobility with impaired strength, balance, endurance and R UE ROM/function. Pt wit R humeral fx that is in a sling and NWB. Pt's daughter present this session and has many questions about post acute rehab in a SNF. PTA, pt lived at home alone with her daughter living nearby with her most of the day but alone at night. Pt was independent with her ADLs/selfcare and used a cane for mobility. Pt now requires max A for bed mobility, was unable to stand to today and total A with selfcare. Pt would benefit from acute OT services to address impairments to maximize level of function and safety    Follow Up Recommendations  SNF    Equipment Recommendations  Other (comment)    Recommendations for Other Services       Precautions / Restrictions Precautions Precautions: Fall Required Braces or Orthoses: Sling(R UE sling, humeral fx) Restrictions Weight Bearing Restrictions: Yes RUE Weight Bearing: Non weight bearing      Mobility Bed Mobility Overal bed mobility: Needs Assistance Bed Mobility: Supine to Sit;Sit to Supine     Supine to sit: Max assist Sit to supine: Total assist   General bed mobility comments: assist with B LEs and to elevate trunk to sit EOB, max A to scoot to HOB sitting  and total A back to supine  Transfers                 General transfer comment: unable to complete sit - stand    Balance Overall balance assessment: Needs assistance Sitting-balance support: Feet  supported;Feet unsupported;Single extremity supported Sitting balance-Leahy Scale: Poor         Standing balance comment: unable                           ADL either performed or assessed with clinical judgement   ADL Overall ADL's : Needs assistance/impaired Eating/Feeding: Minimal assistance;Sitting Eating/Feeding Details (indicate cue type and reason): R UE immobilized in sling Grooming: Wash/dry hands;Wash/dry face;Minimal assistance;Sitting   Upper Body Bathing: Total assistance Upper Body Bathing Details (indicate cue type and reason): Poor sitting balance at EOB Lower Body Bathing: Total assistance   Upper Body Dressing : Total assistance   Lower Body Dressing: Total assistance     Toilet Transfer Details (indicate cue type and reason): unable to complete sit - stand, will need +2 Toileting- Clothing Manipulation and Hygiene: Total assistance;Bed level               Vision Patient Visual Report: No change from baseline       Perception     Praxis      Pertinent Vitals/Pain Pain Assessment: Faces Faces Pain Scale: Hurts even more Pain Location: R shoulder,lergs during bed mobility Pain Descriptors / Indicators: Grimacing;Guarding;Moaning Pain Intervention(s): Limited activity within patient's tolerance;Monitored during session;Premedicated before session;Repositioned     Hand Dominance Right   Extremity/Trunk Assessment Upper Extremity Assessment Upper Extremity Assessment: RUE deficits/detail;Generalized weakness RUE Deficits / Details:  R humerus fx in sling, good hand grip RUE: Unable to fully assess due to immobilization   Lower Extremity Assessment Lower Extremity Assessment: Defer to PT evaluation   Cervical / Trunk Assessment Cervical / Trunk Assessment: Kyphotic   Communication Communication Communication: No difficulties   Cognition Arousal/Alertness: Awake/alert Behavior During Therapy: WFL for tasks  assessed/performed Overall Cognitive Status: Within Functional Limits for tasks assessed                                     General Comments       Exercises     Shoulder Instructions      Home Living Family/patient expects to be discharged to:: Skilled nursing facility Living Arrangements: Alone;Other (Comment)(daughter stays with her during the day, pt alone at night) Available Help at Discharge: Family;Available 24 hours/day Type of Home: House                       Home Equipment: Cane - single point          Prior Functioning/Environment Level of Independence: Needs assistance  Gait / Transfers Assistance Needed: was ambulatory using cane ADL's / Homemaking Assistance Needed: was independent ADL's, pt daughter assists IADL's            OT Problem List: Decreased strength;Impaired balance (sitting and/or standing);Pain;Decreased range of motion;Decreased activity tolerance;Decreased coordination;Decreased knowledge of use of DME or AE;Impaired UE functional use      OT Treatment/Interventions: Self-care/ADL training;DME and/or AE instruction;Therapeutic activities;Therapeutic exercise;Patient/family education    OT Goals(Current goals can be found in the care plan section) Acute Rehab OT Goals Patient Stated Goal: get better and go home OT Goal Formulation: With patient/family Time For Goal Achievement: 06/11/19 ADL Goals Pt Will Perform Grooming: with min guard assist;sitting Pt Will Perform Upper Body Bathing: with max assist;with mod assist;sitting Pt Will Perform Lower Body Bathing: with max assist;with mod assist;sitting/lateral leans Pt Will Perform Upper Body Dressing: with max assist;with mod assist;sitting Pt Will Transfer to Toilet: with max assist;with mod assist;stand pivot transfer;bedside commode Additional ADL Goal #1: Complete bed mobility mod -  min A to sit EOB in prep for ADLs and transfers  OT Frequency: Min 2X/week    Barriers to D/C: Decreased caregiver support          Co-evaluation              AM-PAC OT "6 Clicks" Daily Activity     Outcome Measure Help from another person eating meals?: A Little Help from another person taking care of personal grooming?: A Little Help from another person toileting, which includes using toliet, bedpan, or urinal?: Total Help from another person bathing (including washing, rinsing, drying)?: Total Help from another person to put on and taking off regular upper body clothing?: Total Help from another person to put on and taking off regular lower body clothing?: Total 6 Click Score: 10   End of Session Equipment Utilized During Treatment: Other (comment)(R UE sling) Nurse Communication: Mobility status  Activity Tolerance: No increased pain;Patient limited by fatigue Patient left: in bed;with call bell/phone within reach;with bed alarm set;with family/visitor present  OT Visit Diagnosis: Other abnormalities of gait and mobility (R26.89);Muscle weakness (generalized) (M62.81);History of falling (Z91.81);Pain Pain - Right/Left: Right Pain - part of body: Shoulder;Arm                Time: 4944-9675 OT  Time Calculation (min): 32 min Charges:  OT General Charges $OT Visit: 1 Visit OT Evaluation $OT Eval Moderate Complexity: 1 Mod    Britt Bottom 05/28/2019, 2:46 PM

## 2019-05-28 NOTE — Progress Notes (Signed)
Physical Therapy Treatment Patient Details Name: Mary Guzman MRN: [id number] DOB: Jul 10, 1921 Today's Date: 05/28/2019    History of Present Illness Pt is a 85 y.o. F with significant PMH of breast CA, hypertension, osteoporosis. Pt seen and discharged from [facility] ED 9/9 following a fall and subsequent R humerus fracture. Now presents to [facility] ED following syncopal episode. CT head negative for acute abnormality.    PT Comments    Pt deferring out of bed mobility due to fatigue. Session focused on gentle AROM of right elbow, wrist, hand and PROM of shoulder in addition to lower extremity strengthening exercises. Pt very guarded with right shoulder and has increased pain with any type of movement. Heat pack applied. Pt daughter present and very supportive and engaged. D/c plan remains appropriate.    Follow Up Recommendations  SNF;Supervision/Assistance - 24 hour     Equipment Recommendations  3in1 (PT);Wheelchair (measurements PT);Wheelchair cushion (measurements PT)    Recommendations for Other Services OT consult     Precautions / Restrictions Precautions Precautions: Fall Required Braces or Orthoses: Sling(RUE) Restrictions Weight Bearing Restrictions: Yes RUE Weight Bearing: Non weight bearing    Mobility  Bed Mobility Overal bed mobility: Needs Assistance Bed Mobility: Supine to Sit;Sit to Supine     Supine to sit: Max assist Sit to supine: Total assist   General bed mobility comments: deferred by pt  Transfers                 General transfer comment: deferred by pt  Ambulation/Gait             General Gait Details: unable   Stairs             Wheelchair Mobility    Modified Rankin (Stroke Patients Only)       Balance Overall balance assessment: Needs assistance Sitting-balance support: Feet supported;Feet unsupported;Single extremity supported Sitting balance-Leahy Scale: Poor         Standing balance comment: unable                             Cognition Arousal/Alertness: Awake/alert Behavior During Therapy: WFL for tasks assessed/performed Overall Cognitive Status: Within Functional Limits for tasks assessed                                 General Comments: WFL for basic mobility      Exercises General Exercises - Upper Extremity Shoulder Flexion: PROM;5 reps;Right(to ~50 ) Shoulder Extension: Right;PROM;5 reps;Supine Elbow Flexion: AROM;10 reps;Right Elbow Extension: AROM;Right;10 reps Digit Composite Flexion: AROM;Right;10 reps Composite Extension: AROM;Right;10 reps General Exercises - Lower Extremity Heel Slides: 10 reps;Both;Supine Hip ABduction/ADduction: Both;10 reps;Supine Straight Leg Raises: Both;10 reps;Supine Hand Exercises Forearm Supination: Both;10 reps;Supine Forearm Pronation: Both;10 reps;Supine    General Comments        Pertinent Vitals/Pain Pain Assessment: Faces Faces Pain Scale: Hurts even more Pain Location: R shoulder Pain Descriptors / Indicators: Grimacing;Guarding Pain Intervention(s): Monitored during session;Premedicated before session;Repositioned    Home Living Family/patient expects to be discharged to:: Skilled nursing facility Living Arrangements: Alone;Other (Comment)(daughter stays with her during the day, pt alone at night) Available Help at Discharge: Family;Available 24 hours/day Type of Home: House       Home Equipment: Kasandra Knudsen - single point      Prior Function Level of Independence: Needs assistance  Gait / Transfers Assistance Needed: was ambulatory  using cane ADL's / Homemaking Assistance Needed: was independent ADL's, pt daughter assists IADL's     PT Goals (current goals can now be found in the care plan section) Acute Rehab PT Goals Patient Stated Goal: "less pain." PT Goal Formulation: With patient/family Time For Goal Achievement: 06/10/19 Potential to Achieve Goals: Fair    Frequency    Min 3X/week       PT Plan Current plan remains appropriate    Co-evaluation              AM-PAC PT "6 Clicks" Mobility   Outcome Measure  Help needed turning from your back to your side while in a flat bed without using bedrails?: A Little Help needed moving from lying on your back to sitting on the side of a flat bed without using bedrails?: A Lot Help needed moving to and from a bed to a chair (including a wheelchair)?: A Lot Help needed standing up from a chair using your arms (e.g., wheelchair or bedside chair)?: A Lot Help needed to walk in hospital room?: A Lot Help needed climbing 3-5 steps with a railing? : Total 6 Click Score: 12    End of Session Equipment Utilized During Treatment: Other (comment)(sling) Activity Tolerance: Patient tolerated treatment well Patient left: in bed;with call bell/phone within reach;with family/visitor present Nurse Communication: Mobility status PT Visit Diagnosis: Unsteadiness on feet (R26.81);Pain;Difficulty in walking, not elsewhere classified (R26.2) Pain - Right/Left: Right Pain - part of body: Shoulder     Time: 6701-4103 PT Time Calculation (min) (ACUTE ONLY): 21 min  Charges:  $Therapeutic Exercise: 8-22 mins                     Ellamae Sia, PT, DPT Acute Rehabilitation Services Pager [phone] Office [phone]    Willy Eddy 05/28/2019, 4:28 PM

## 2019-05-28 NOTE — Telephone Encounter (Signed)
Left a message for a return call.

## 2019-05-28 NOTE — NC FL2 (Signed)
MEDICAID FL2 LEVEL OF CARE SCREENING TOOL     IDENTIFICATION  Patient Name: Mary Guzman Birthdate: 1921/06/11 Sex: female Admission Date (Current Location): 05/27/2019  [facility] and [place] Number:  Herbalist and Address:  [facility] [place] [address] [place], [place] [address]      Provider Number: [id number]  Attending Physician Name and Address:  Mendel Corning, MD  Relative Name and Phone Number:  Monia Timmers, daughter, [phone]    Current Level of Care: Hospital Recommended Level of Care: [place] Prior Approval Number:    Date Approved/Denied:   PASRR Number: [id number]  Discharge Plan: SNF    Current Diagnoses: Patient Active Problem List   Diagnosis Date Noted  . Appendicitis 05/27/2019  . Right humeral fracture 05/27/2019  . Deficiency anemia 06/02/2018  . Chronic kidney disease (CKD), stage IV (severe) ([place]) 06/02/2018  . Iron deficiency anemia due to chronic blood loss 06/01/2018  . Anemia in chronic kidney disease 07/02/2016  . CKD (chronic kidney disease), stage III ([place]) 07/29/2015  . Chronic systolic CHF (congestive heart failure) ([place]) 07/16/2015  . Acute on chronic systolic heart failure ([place]) 06/30/2015  . Bilateral leg edema 07/27/2014  . Chronic kidney disease 06/07/2013  . Essential hypertension 07/03/2009  . Hypothyroidism 03/16/2007  . GERD 03/16/2007  . Osteoarthritis 03/16/2007  . Osteoporosis 03/16/2007  . BREAST CANCER, HX OF 03/16/2007    Orientation RESPIRATION BLADDER Height & Weight     Self, Situation, Place  Normal Continent, External catheter Weight: 127 lb 13.9 oz (58 kg) Height:  5\' 4"  (162.6 cm)  BEHAVIORAL SYMPTOMS/MOOD NEUROLOGICAL BOWEL NUTRITION STATUS      Continent Diet(see discharge summary)  AMBULATORY STATUS COMMUNICATION OF NEEDS Skin   Extensive Assist Verbally Other (Comment)(generalized ecchymosis; arm in sling)                        Personal Care Assistance Level of Assistance  Bathing, Feeding, Dressing Bathing Assistance: Maximum assistance Feeding assistance: Independent Dressing Assistance: Maximum assistance     Functional Limitations Info  Sight, Speech, Hearing Sight Info: Adequate Hearing Info: Impaired Speech Info: Adequate    SPECIAL CARE FACTORS FREQUENCY  PT (By licensed PT), OT (By licensed OT)     PT Frequency: 5x week OT Frequency: 5x week            Contractures Contractures Info: Not present    Additional Factors Info  Code Status Code Status Info: Full Code             Current Medications (05/28/2019):  This is the current hospital active medication list Current Facility-Administered Medications  Medication Dose Route Frequency Provider Last Rate Last Dose  . acetaminophen (TYLENOL) tablet 650 mg  650 mg Oral Q6H PRN Arrien, Jimmy Picket, MD       Or  . acetaminophen (TYLENOL) suppository 650 mg  650 mg Rectal Q6H PRN Arrien, Jimmy Picket, MD      . dextrose 5 % in lactated ringers infusion   Intravenous Continuous Arrien, Jimmy Picket, MD   Stopped at 05/28/19 1642  . enoxaparin (LOVENOX) injection 30 mg  30 mg Subcutaneous Q24H Arrien, Jimmy Picket, MD   30 mg at 05/27/19 2013  . hydroxypropyl methylcellulose / hypromellose (ISOPTO TEARS / GONIOVISC) 2.5 % ophthalmic solution 1 drop  1 drop Both Eyes Daily PRN Arrien, Jimmy Picket, MD      . levothyroxine ([place], [place])  injection 37.5 mcg  37.5 mcg Intravenous Daily Tawni Millers, MD   37.5 mcg at 05/28/19 0949  . MEDLINE mouth rinse  15 mL Mouth Rinse BID Arrien, Jimmy Picket, MD   15 mL at 05/28/19 0954  . morphine 2 MG/ML injection 1 mg  1 mg Intravenous Q2H PRN Arrien, Jimmy Picket, MD   1 mg at 05/28/19 1127  . ondansetron (ZOFRAN) tablet 4 mg  4 mg Oral Q6H PRN Arrien, Jimmy Picket, MD       Or  . ondansetron [facility]) injection 4 mg  4 mg Intravenous Q6H PRN Arrien, Jimmy Picket, MD      . pantoprazole (PROTONIX) injection 40 mg  40 mg Intravenous QHS Tawni Millers, MD   40 mg at 05/27/19 2202  . piperacillin-tazobactam (ZOSYN) IVPB 2.25 g  2.25 g Intravenous Q8H Nwogu, Ivy A, RPH 100 mL/hr at 05/28/19 1650 2.25 g at 05/28/19 1650     Discharge Medications: Please see discharge summary for a list of discharge medications.  Relevant Imaging Results:  Relevant Lab Results:   Additional Information SS# [place], [place]

## 2019-05-28 NOTE — Consult Note (Signed)
Reason for Consult:Right humerus fx Referring Physician: Irine Seal is an 85 y.o. female.  HPI: Mary Guzman was in her bathroom on 9/8. She turned around to leave and next thing she knew she was on the floor. She was brought to the ED and diagnosed with a humerus fx. She was given a sling and discharged home with plans to f/u with Dr. [name] but somehow this information did not make it onto her printed AVS. She was readmitted yesterday with acute appendicitis and orthopedic surgery was asked to consult on her here. She c/o localized right shoulder pain. She is RHD.  Past Medical History:  Diagnosis Date  . Cancer (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  . DJD (degenerative joint disease)   . GERD (gastroesophageal reflux disease)   . Hypertension   . Osteoporosis   . Thyroid disease    Hypothyroidism    Past Surgical History:  Procedure Laterality Date  . ANKLE SURGERY     Right  . INTRACAPSULAR CATARACT EXTRACTION    . MASTECTOMY  1994    Family History  Problem Relation Age of Onset  . Alcohol abuse Brother   . Asthma Son     Social History:  reports that she has never smoked. Her smokeless tobacco use includes chew. She reports that she does not drink alcohol. No history on file for drug.  Allergies: No Known Allergies  Medications: I have reviewed the patient's current medications.  Results for orders placed or performed during the hospital encounter of 05/27/19 (from the past 48 hour(s))  Comprehensive metabolic panel     Status: Abnormal   Collection Time: 05/27/19 12:46 PM  Result Value Ref Range   Sodium 138 135 - 145 mmol/L   Potassium 3.8 3.5 - 5.1 mmol/L   Chloride 105 98 - 111 mmol/L   CO2 20 (L) 22 - 32 mmol/L   Glucose, Bld 110 (H) 70 - 99 mg/dL   BUN 63 (H) 8 - 23 mg/dL   Creatinine, Ser 2.91 (H) 0.44 - 1.00 mg/dL   Calcium 8.3 (L) 8.9 - 10.3 mg/dL   Total Protein 6.1 (L) 6.5 - 8.1 g/dL    Albumin 2.7 (L) 3.5 - 5.0 g/dL   AST 38 15 - 41 U/L   ALT 25 0 - 44 U/L   Alkaline Phosphatase 228 (H) 38 - 126 U/L   Total Bilirubin 0.4 0.3 - 1.2 mg/dL   GFR calc non Af Amer 13 (L) >60 mL/min   GFR calc Af Amer 15 (L) >60 mL/min   Anion gap 13 5 - 15    Comment: Performed at [facility], [address], [place], [place] [address]  Troponin I (High Sensitivity)     Status: Abnormal   Collection Time: 05/27/19 12:46 PM  Result Value Ref Range   Troponin I (High Sensitivity) 38 (H) <18 ng/L    Comment: (NOTE) Elevated high sensitivity troponin I (hsTnI) values and significant  changes across serial measurements may suggest ACS but many other  chronic and acute conditions are known to elevate hsTnI results.  Refer to the "Links" section for chest pain algorithms and additional  guidance. Performed at [facility], [place] [address] [place], [place] [address]   CBC with Differential     Status: Abnormal   Collection Time: 05/27/19 12:46 PM  Result Value Ref Range   WBC 11.7 (H)  4.0 - 10.5 K/uL   RBC 3.59 (L) 3.87 - 5.11 MIL/uL   Hemoglobin 9.9 (L) 12.0 - 15.0 g/dL   HCT 32.1 (L) 36.0 - 46.0 %   MCV 89.4 80.0 - 100.0 fL   MCH 27.6 26.0 - 34.0 pg   MCHC 30.8 30.0 - 36.0 g/dL   RDW 16.1 (H) 11.5 - 15.5 %   Platelets 237 150 - 400 K/uL   nRBC 0.0 0.0 - 0.2 %   Neutrophils Relative % 90 %   Neutro Abs 10.5 (H) 1.7 - 7.7 K/uL   Lymphocytes Relative 6 %   Lymphs Abs 0.7 0.7 - 4.0 K/uL   Monocytes Relative 4 %   Monocytes Absolute 0.5 0.1 - 1.0 K/uL   Eosinophils Relative 0 %   Eosinophils Absolute 0.0 0.0 - 0.5 K/uL   Basophils Relative 0 %   Basophils Absolute 0.0 0.0 - 0.1 K/uL   Immature Granulocytes 0 %   Abs Immature Granulocytes 0.04 0.00 - 0.07 K/uL    Comment: Performed at [facility] [address] [place], [place] [address]  Troponin I (High Sensitivity)     Status: Abnormal   Collection Time: 05/27/19  3:00 PM  Result Value Ref Range   Troponin  I (High Sensitivity) 39 (H) <18 ng/L    Comment: (NOTE) Elevated high sensitivity troponin I (hsTnI) values and significant  changes across serial measurements may suggest ACS but many other  chronic and acute conditions are known to elevate hsTnI results.  Refer to the "Links" section for chest pain algorithms and additional  guidance. Performed at [facility], [place] [address], [place], [place] [address]   SARS Coronavirus 2 [facility] order, Performed in [facility] hospital lab) Nasopharyngeal Nasopharyngeal Swab     Status: None   Collection Time: 05/27/19  6:26 PM   Specimen: Nasopharyngeal Swab  Result Value Ref Range   SARS Coronavirus 2 NEGATIVE NEGATIVE    Comment: (NOTE) If result is NEGATIVE SARS-CoV-2 target nucleic acids are NOT DETECTED. The SARS-CoV-2 RNA is generally detectable in upper and lower  respiratory specimens during the acute phase of infection. The lowest  concentration of SARS-CoV-2 viral copies this assay can detect is 250  copies / mL. A negative result does not preclude SARS-CoV-2 infection  and should not be used as the sole basis for treatment or other  patient management decisions.  A negative result may occur with  improper specimen collection / handling, submission of specimen other  than nasopharyngeal swab, presence of viral mutation(s) within the  areas targeted by this assay, and inadequate number of viral copies  (<250 copies / mL). A negative result must be combined with clinical  observations, patient history, and epidemiological information. If result is POSITIVE SARS-CoV-2 target nucleic acids are DETECTED. The SARS-CoV-2 RNA is generally detectable in upper and lower  respiratory specimens dur ing the acute phase of infection.  Positive  results are indicative of active infection with SARS-CoV-2.  Clinical  correlation with patient history and other diagnostic information is  necessary to determine patient infection status.   Positive results do  not rule out bacterial infection or co-infection with other viruses. If result is PRESUMPTIVE POSTIVE SARS-CoV-2 nucleic acids MAY BE PRESENT.   A presumptive positive result was obtained on the submitted specimen  and confirmed on repeat testing.  While 2019 novel coronavirus  (SARS-CoV-2) nucleic acids may be present in the submitted sample  additional confirmatory testing may be  necessary for epidemiological  and / or clinical management purposes  to differentiate between  SARS-CoV-2 and other Sarbecovirus currently known to infect humans.  If clinically indicated additional testing with an alternate test  methodology [phone]) is advised. The SARS-CoV-2 RNA is generally  detectable in upper and lower respiratory sp ecimens during the acute  phase of infection. The expected result is Negative. Fact Sheet for Patients:  StrictlyIdeas.no Fact Sheet for Healthcare Providers: BankingDealers.co.za This test is not yet approved or cleared by the [place] FDA and has been authorized for detection and/or diagnosis of SARS-CoV-2 by FDA under an Emergency Use Authorization (EUA).  This EUA will remain in effect (meaning this test can be used) for the duration of the COVID-19 declaration under Section 564(b)(1) of the Act, 21 U.S.C. section 360bbb-3(b)(1), unless the authorization is terminated or revoked sooner. Performed at [facility], [place] [address], [place], [place] [address]   CBC     Status: Abnormal   Collection Time: 05/27/19  7:22 PM  Result Value Ref Range   WBC 13.5 (H) 4.0 - 10.5 K/uL   RBC 3.62 (L) 3.87 - 5.11 MIL/uL   Hemoglobin 10.2 (L) 12.0 - 15.0 g/dL   HCT 32.3 (L) 36.0 - 46.0 %   MCV 89.2 80.0 - 100.0 fL   MCH 28.2 26.0 - 34.0 pg   MCHC 31.6 30.0 - 36.0 g/dL   RDW 16.0 (H) 11.5 - 15.5 %   Platelets 270 150 - 400 K/uL   nRBC 0.0 0.0 - 0.2 %    Comment: Performed at [facility] [address], [place], [place] [address]  Creatinine, serum     Status: Abnormal   Collection Time: 05/27/19  7:22 PM  Result Value Ref Range   Creatinine, Ser 3.17 (H) 0.44 - 1.00 mg/dL   GFR calc non Af Amer 12 (L) >60 mL/min   GFR calc Af Amer 14 (L) >60 mL/min    Comment: Performed at [facility] [address] [place], [place] [address]  Urinalysis, Routine w reflex microscopic     Status: Abnormal   Collection Time: 05/28/19  2:49 AM  Result Value Ref Range   Color, Urine YELLOW YELLOW   APPearance HAZY (A) CLEAR   Specific Gravity, Urine 1.015 1.005 - 1.030   pH 5.0 5.0 - 8.0   Glucose, UA NEGATIVE NEGATIVE mg/dL   Hgb urine dipstick NEGATIVE NEGATIVE   Bilirubin Urine NEGATIVE NEGATIVE   Ketones, ur NEGATIVE NEGATIVE mg/dL   Protein, ur NEGATIVE NEGATIVE mg/dL   Nitrite NEGATIVE NEGATIVE   Leukocytes,Ua TRACE (A) NEGATIVE   RBC / HPF 0-5 0 - 5 RBC/hpf   WBC, UA 11-20 0 - 5 WBC/hpf   Bacteria, UA NONE SEEN NONE SEEN   Squamous Epithelial / LPF 0-5 0 - 5   Mucus PRESENT    Non Squamous Epithelial 0-5 (A) NONE SEEN    Comment: Performed at [facility], [place] [address] [place], [place] [address]  Basic metabolic panel     Status: Abnormal   Collection Time: 05/28/19  2:55 AM  Result Value Ref Range   Sodium 138 135 - 145 mmol/L   Potassium 3.9 3.5 - 5.1 mmol/L   Chloride 106 98 - 111 mmol/L   CO2 18 (L) 22 - 32 mmol/L   Glucose, Bld 125 (H) 70 - 99 mg/dL   BUN 71 (H) 8 - 23 mg/dL   Creatinine, Ser 3.09 (  H) 0.44 - 1.00 mg/dL   Calcium 8.2 (L) 8.9 - 10.3 mg/dL   GFR calc non Af Amer 12 (L) >60 mL/min   GFR calc Af Amer 14 (L) >60 mL/min   Anion gap 14 5 - 15    Comment: Performed at [facility] [address] [place], [place] [address]  CBC     Status: Abnormal   Collection Time: 05/28/19  2:55 AM  Result Value Ref Range   WBC 12.6 (H) 4.0 - 10.5 K/uL   RBC 3.26 (L) 3.87 - 5.11 MIL/uL   Hemoglobin 9.1 (L) 12.0 - 15.0 g/dL   HCT 28.7 (L)  36.0 - 46.0 %   MCV 88.0 80.0 - 100.0 fL   MCH 27.9 26.0 - 34.0 pg   MCHC 31.7 30.0 - 36.0 g/dL   RDW 16.0 (H) 11.5 - 15.5 %   Platelets 243 150 - 400 K/uL   nRBC 0.0 0.0 - 0.2 %    Comment: Performed at [facility], [place] [address], [place], [place] [address]    Ct Abdomen Pelvis Wo Contrast  Result Date: 05/27/2019 CLINICAL DATA:  Acute, diffuse abdominal pain following a fall due to a syncopal episode. EXAM: CT ABDOMEN AND PELVIS WITHOUT CONTRAST TECHNIQUE: Multidetector CT imaging of the abdomen and pelvis was performed following the standard protocol without IV contrast. COMPARISON:  None. FINDINGS: Lower chest: Enlarged heart. Dense mitral valve annulus calcifications found coronary artery calcifications. Minimal linear atelectasis or scarring at the right lung base. Hepatobiliary: No focal liver abnormality is seen. No gallstones, gallbladder wall thickening, or biliary dilatation. Pancreas: Moderate diffuse pancreatic atrophy. Spleen: Normal in size without focal abnormality. Adrenals/Urinary Tract: Normal appearing adrenal glands. Multiple rounded, exophytic bilateral renal masses and left renal parapelvic cysts. The exophytic masses range between water density and a maximum of 34 Hounsfield units in density. Unremarkable urinary bladder. No visible ureteral abnormalities. No calculi or hydronephrosis. Stomach/Bowel: Small hiatal hernia. Enlarged appendix containing multiple appendicoliths. The appendix measures up to 12 mm in diameter. Mild adjacent soft tissue stranding and fat induration. Small focal area of adjacent gas with a somewhat linear configuration. The appendix origin is in the upper pelvis on the right. The appendix extends horizontally toward the midline. Unremarkable small bowel and colon. Vascular/Lymphatic: Atheromatous arterial calcifications without aneurysm. No enlarged lymph nodes. Reproductive: Uterus and bilateral adnexa are unremarkable. Other: No free peritoneal  fluid or air. Musculoskeletal: Comminuted left pubic body fracture with sclerosis and corticated fragments. 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 No acute fractures are seen. Left sacral bone island. IMPRESSION: 1. Acute appendicitis with multiple appendicoliths and a small focal, contained perforation without abscess. 2. No evidence of acute abdominal or pelvic injury. 3. Comminuted left pubic body fracture with sclerosis and corticated fragments, compatible with an old fracture with nonunion. 4. Small hiatal hernia. 5. Cardiomegaly. 6. Dense mitral valve annulus and coronary artery atherosclerosis. 7. Multiple bilateral renal masses, as described above. These could represent a combination of simple and complicated cysts and/or solid masses. Electronically Signed   By: Claudie Revering M.D.   On: 05/27/2019 16:23   Ct Head Wo Contrast  Result Date: 05/27/2019 CLINICAL DATA:  Syncopal episode and confusion today. Fell yesterday. EXAM: CT HEAD WITHOUT CONTRAST TECHNIQUE: Contiguous axial images were obtained from the base of the skull through the vertex without intravenous contrast. COMPARISON:  None. FINDINGS: Brain: Moderately enlarged ventricles and subarachnoid spaces. Moderate patchy white matter low density in both cerebral hemispheres.  Old left thalamus lacunar infarct. No intracranial hemorrhage, mass lesion or CT evidence of acute infarction. Vascular: No hyperdense vessel or unexpected calcification. Skull: Normal. Negative for fracture or focal lesion. Sinuses/Orbits: Status post bilateral cataract extraction. Unremarkable bones and included paranasal sinuses. Other: None. IMPRESSION: 1. No acute abnormality. 2. Moderate diffuse cerebral and cerebellar atrophy. 3. Moderate chronic small vessel white matter ischemic changes in both cerebral hemispheres. 4. Old left thalamus lacunar infarct. Electronically Signed   By: Claudie Revering M.D.   On: 05/27/2019 16:06   Dg Chest Portable 1 View  Result Date:  05/27/2019 CLINICAL DATA:  Syncope, shortness of breath, known right humeral fracture EXAM: PORTABLE CHEST 1 VIEW COMPARISON:  Humeral radiograph 1 day prior, chest radiograph 09/22/2017 FINDINGS: Redemonstration of the impacted and angulated fracture involving the surgical neck of the right humerus with some increasing inferior displacement of the humeral head relative to the glenoid, possibly reflecting a shoulder effusion. Elevation of the right hemidiaphragm is similar to comparison radiographs. There streaky atelectatic changes. Mild diffuse airways thickening is present. No consolidation, features of edema, pneumothorax, or effusion. The aorta is calcified. The remaining cardiomediastinal contours are unremarkable. Post mastectomy changes involving the left chest wall and axilla. IMPRESSION: Atelectasis and mild bronchitic changes. Chronic elevation of the right hemidiaphragm. Redemonstration of the impacted and angulated fracture of the right humeral surgical neck with increasing inferior displacement possibly reflecting developing effusion. Electronically Signed   By: Lovena Le M.D.   On: 05/27/2019 14:03   Dg Humerus Right  Result Date: 05/26/2019 CLINICAL DATA:  [place] at home, upper arm pain EXAM: RIGHT HUMERUS - 2+ VIEW COMPARISON:  None. FINDINGS: Acute fracture involving the right humeral neck with impaction and mild posterior angulation of distal fracture fragment. Humeral head appears to project over the glenoid. IMPRESSION: Acute impacted and slightly angulated fracture involving the right humeral neck Electronically Signed   By: Donavan Foil M.D.   On: 05/26/2019 13:40    Review of Systems  Constitutional: Negative for weight loss.  HENT: Negative for ear discharge, ear pain, hearing loss and tinnitus.   Eyes: Negative for blurred vision, double vision, photophobia and pain.  Respiratory: Negative for cough, sputum production and shortness of breath.   Cardiovascular: Negative for  chest pain.  Gastrointestinal: Negative for abdominal pain, nausea and vomiting.  Genitourinary: Negative for dysuria, flank pain, frequency and urgency.  Musculoskeletal: Positive for joint pain (Right shoulder). Negative for back pain, falls, myalgias and neck pain.  Neurological: Negative for dizziness, tingling, sensory change, focal weakness, loss of consciousness and headaches.  Endo/Heme/Allergies: Does not bruise/bleed easily.  Psychiatric/Behavioral: Negative for depression, memory loss and substance abuse. The patient is not nervous/anxious.    Blood pressure (!) 112/49, pulse 81, temperature 98.4 F (36.9 C), temperature source Oral, resp. rate 17, height 5\' 4"  (1.626 m), weight 58 kg, SpO2 93 %. Physical Exam  Constitutional: She appears well-developed and well-nourished. No distress.  HENT:  Head: Normocephalic and atraumatic.  Eyes: Conjunctivae are normal. Right eye exhibits no discharge. Left eye exhibits no discharge. No scleral icterus.  Neck: Normal range of motion.  Cardiovascular: Normal rate and regular rhythm.  Respiratory: Effort normal. No respiratory distress.  Musculoskeletal:     Comments: Right shoulder, elbow, wrist, digits- no skin wounds, mod shoulder TTP, in sling  Sens  Ax/R/M/U intact  Mot   Ax/ R/ PIN/ M/ AIN/ U intact  Rad 2+  Neurological: She is alert.  Skin: Skin is warm and  dry. She is not diaphoretic.  Psychiatric: She has a normal mood and affect. Her behavior is normal.    Assessment/Plan: Right humerus fx -- Plan NWB in sling. She should f/u with Dr. [name] in the office in 2-3 weeks. Acute appendicitis HTN/HLD    Lisette Abu, PA-C Orthopedic Surgery [phone] 05/28/2019, 10:41 AM

## 2019-05-28 NOTE — Progress Notes (Addendum)
[facility]                                                                              Patient Demographics  Mary Guzman, is a 85 y.o. female, DOB - 08/10/1921, LZJ:[id number]  Admit date - 05/27/2019   Admitting Physician Mauricio Gerome Apley, MD  Outpatient Primary MD for the patient is [name], NP  Outpatient specialists:   LOS - 1  days   Medical records reviewed and are as summarized below:    Chief Complaint  Patient presents with  . Loss of Consciousness  . Fatigue       Brief summary   Patient is a 85 year old female with history of hypertension, hypothyroidism presented with 2 to 3-day history of lower abdominal pain, dull to colicky nature associated with chills.  No fevers, no nausea vomiting or diarrhea. 24 hours prior to hospitalization, she suffered a syncope episode while standing in the bathroom, she landed on the right side and suffered a right humeral fracture.  She was seen in ED, right shoulder sling was placed and and discharged back home.  Patient continued to have abdominal pain, was able to eat.  On the morning of admission, had an episode of unresponsiveness however recovered her consciousness fairly rapidly.  Patient was brought back to ED.  Extensive work-up showed appendicitis.  Surgery was consulted and recommended conservative management. COVID-19 test negative  Assessment & Plan    Principal Problem: Acute appendicitis: -CT abdomen showed acute appendicitis with multiple appendicoliths, small focal contained perforation without abscess. -Patient was evaluated by surgical service, recommended to continue conservative management, high risk for surgery at her age and multiple medical problems -Continue n.p.o., IV fluids, broad-spectrum antibiotic therapy -Management per general surgery  Active Problems: Acute kidney injury on CKD (chronic kidney disease), stage III (HCC) -Likely prerenal secondary to dehydration, acute  appendicitis -Baseline creatinine 2.5-2.6, trending up, creatinine 3.0 (3.1 on 9/10) -Continue gentle IV fluid hydration, hold nephrotoxic meds, Lasix    Chronic systolic CHF (congestive heart failure) (HCC), mild troponin elevation -BP soft, continue to hold Lasix, metoprolol -Follow volume status closely, currently euvolemic -Mildly elevated troponin likely due to demand ischemia  Syncope - likely due to acute appendicitis, vasovagal episode  -Continue IV fluid hydration, no acute issues    Hypothyroidism -Continue IV Synthroid    Essential hypertension -BP soft, continue to hold amlodipine, Lasix, metoprolol    GERD -Continue PPI    Right humeral fracture -Due to recent fall, impacted and slightly angulated fracture, right shoulder sling was placed -Continue pain control, embolization, outpatient follow-up with orthopedics -she will need orthopedics evaluation and follow-up (per daughter, they were not given any instructions in ED), orthopedics consulted, will follow today inpatient  Code Status: Full code DVT Prophylaxis:  Lovenox  Family Communication: Discussed all imaging results, lab results, explained to the patient and daughter on the phone   Disposition Plan: Remains inpatient, high risk of decompensation due to acute appendicitis with contained perforation, advanced  Age with acute kidney injury, underlying CHF, other medical problems  Time Spent in minutes 35 minutes  Procedures:  CT abdomen  Consultants:  General surgery Orthopedics  Antimicrobials:   Anti-infectives (From admission, onward)   Start     Dose/Rate Route Frequency Ordered Stop   05/28/19 0100  piperacillin-tazobactam (ZOSYN) IVPB 2.25 g     2.25 g 100 mL/hr over 30 Minutes Intravenous Every 8 hours 05/27/19 1831     05/27/19 1815  piperacillin-tazobactam (ZOSYN) IVPB 3.375 g     3.375 g 100 mL/hr over 30 Minutes Intravenous  Once 05/27/19 1814 05/27/19 2048   05/27/19 1630   cefTRIAXone (ROCEPHIN) 1 g in sodium chloride 0.9 % 100 mL IVPB     1 g 200 mL/hr over 30 Minutes Intravenous  Once 05/27/19 1628 05/27/19 1849   05/27/19 1630  metroNIDAZOLE (FLAGYL) IVPB 500 mg     500 mg 100 mL/hr over 60 Minutes Intravenous  Once 05/27/19 1628 05/27/19 2111          Medications  Scheduled Meds: . enoxaparin (LOVENOX) injection  30 mg Subcutaneous Q24H  . levothyroxine  37.5 mcg Intravenous Daily  . mouth rinse  15 mL Mouth Rinse BID  . pantoprazole (PROTONIX) IV  40 mg Intravenous QHS   Continuous Infusions: . dextrose 5% lactated ringers 75 mL/hr at 05/27/19 2009  . piperacillin-tazobactam (ZOSYN)  IV 2.25 g (05/28/19 0138)   PRN Meds:.acetaminophen **OR** acetaminophen, hydroxypropyl methylcellulose / hypromellose, morphine injection, ondansetron **OR** ondansetron (ZOFRAN) IV      Subjective:   Danee Soller was seen and examined today.  Still feeling abdominal pain and tenderness, 5/10.  No fevers or chills, no nausea or vomiting.  Patient denies dizziness, chest pain, shortness of breath,  new weakness, numbess, tingling. No acute events overnight.    Objective:   Vitals:   05/27/19 1851 05/27/19 2033 05/28/19 0500 05/28/19 0501  BP: (!) 148/64 (!) 121/54  (!) 112/49  Pulse: 74 65  81  Resp: 20 17  17   Temp: 97.8 F (36.6 C) 97.8 F (36.6 C)  98.4 F (36.9 C)  TempSrc: Oral Oral  Oral  SpO2: 96% 95%  93%  Weight:   58 kg   Height:        Intake/Output Summary (Last 24 hours) at 05/28/2019 1601 Last data filed at 05/28/2019 0932 Gross per 24 hour  Intake 0 ml  Output 200 ml  Net -200 ml     Wt Readings from Last 3 Encounters:  05/28/19 58 kg  03/18/19 57.8 kg  11/05/18 58.2 kg     Exam  General: Alert and oriented x 3, NAD  Eyes:   HEENT:  Atraumatic, normocephalic,  Cardiovascular: S1 S2 auscultated, no murmurs, RRR  Respiratory: Clear to auscultation bilaterally, no wheezing, rales or rhonchi  Gastrointestinal:  Soft, TTP right lower quadrant, rebound TTP in left lower quadrant , nondistended, + bowel sounds  Ext: no pedal edema bilaterally, right shoulder sling +  Neuro: No new deficit  Musculoskeletal: No digital cyanosis, clubbing  Skin: No rashes  Psych: Normal affect and demeanor, alert and oriented x3    Data Reviewed:  I have personally reviewed following labs and imaging studies  Micro Results Recent Results (from the past 240 hour(s))  SARS Coronavirus 2 [facility] order, Performed in [place] hospital lab) Nasopharyngeal Nasopharyngeal Swab     Status: None   Collection Time: 05/27/19  6:26 PM   Specimen: Nasopharyngeal Swab  Result Value Ref Range Status   SARS Coronavirus 2 NEGATIVE NEGATIVE Final    Comment: (NOTE) If result is NEGATIVE SARS-CoV-2 target nucleic acids are NOT  DETECTED. The SARS-CoV-2 RNA is generally detectable in upper and lower  respiratory specimens during the acute phase of infection. The lowest  concentration of SARS-CoV-2 viral copies this assay can detect is 250  copies / mL. A negative result does not preclude SARS-CoV-2 infection  and should not be used as the sole basis for treatment or other  patient management decisions.  A negative result may occur with  improper specimen collection / handling, submission of specimen other  than nasopharyngeal swab, presence of viral mutation(s) within the  areas targeted by this assay, and inadequate number of viral copies  (<250 copies / mL). A negative result must be combined with clinical  observations, patient history, and epidemiological information. If result is POSITIVE SARS-CoV-2 target nucleic acids are DETECTED. The SARS-CoV-2 RNA is generally detectable in upper and lower  respiratory specimens dur ing the acute phase of infection.  Positive  results are indicative of active infection with SARS-CoV-2.  Clinical  correlation with patient history and other diagnostic information is  necessary  to determine patient infection status.  Positive results do  not rule out bacterial infection or co-infection with other viruses. If result is PRESUMPTIVE POSTIVE SARS-CoV-2 nucleic acids MAY BE PRESENT.   A presumptive positive result was obtained on the submitted specimen  and confirmed on repeat testing.  While 2019 novel coronavirus  (SARS-CoV-2) nucleic acids may be present in the submitted sample  additional confirmatory testing may be necessary for epidemiological  and / or clinical management purposes  to differentiate between  SARS-CoV-2 and other Sarbecovirus currently known to infect humans.  If clinically indicated additional testing with an alternate test  methodology [phone]) is advised. The SARS-CoV-2 RNA is generally  detectable in upper and lower respiratory sp ecimens during the acute  phase of infection. The expected result is Negative. Fact Sheet for Patients:  StrictlyIdeas.no Fact Sheet for Healthcare Providers: BankingDealers.co.za This test is not yet approved or cleared by the [place] FDA and has been authorized for detection and/or diagnosis of SARS-CoV-2 by FDA under an Emergency Use Authorization (EUA).  This EUA will remain in effect (meaning this test can be used) for the duration of the COVID-19 declaration under Section 564(b)(1) of the Act, 21 U.S.C. section 360bbb-3(b)(1), unless the authorization is terminated or revoked sooner. Performed at [facility], [place] [address], [place], [place] [address]     Radiology Reports Ct Abdomen Pelvis Wo Contrast  Result Date: 05/27/2019 CLINICAL DATA:  Acute, diffuse abdominal pain following a fall due to a syncopal episode. EXAM: CT ABDOMEN AND PELVIS WITHOUT CONTRAST TECHNIQUE: Multidetector CT imaging of the abdomen and pelvis was performed following the standard protocol without IV contrast. COMPARISON:  None. FINDINGS: Lower chest: Enlarged heart.  Dense mitral valve annulus calcifications found coronary artery calcifications. Minimal linear atelectasis or scarring at the right lung base. Hepatobiliary: No focal liver abnormality is seen. No gallstones, gallbladder wall thickening, or biliary dilatation. Pancreas: Moderate diffuse pancreatic atrophy. Spleen: Normal in size without focal abnormality. Adrenals/Urinary Tract: Normal appearing adrenal glands. Multiple rounded, exophytic bilateral renal masses and left renal parapelvic cysts. The exophytic masses range between water density and a maximum of 34 Hounsfield units in density. Unremarkable urinary bladder. No visible ureteral abnormalities. No calculi or hydronephrosis. Stomach/Bowel: Small hiatal hernia. Enlarged appendix containing multiple appendicoliths. The appendix measures up to 12 mm in diameter. Mild adjacent soft tissue stranding and fat induration. Small focal area of adjacent gas with a somewhat linear configuration.  The appendix origin is in the upper pelvis on the right. The appendix extends horizontally toward the midline. Unremarkable small bowel and colon. Vascular/Lymphatic: Atheromatous arterial calcifications without aneurysm. No enlarged lymph nodes. Reproductive: Uterus and bilateral adnexa are unremarkable. Other: No free peritoneal fluid or air. Musculoskeletal: Comminuted left pubic body fracture with sclerosis and corticated fragments. 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 No acute fractures are seen. Left sacral bone island. IMPRESSION: 1. Acute appendicitis with multiple appendicoliths and a small focal, contained perforation without abscess. 2. No evidence of acute abdominal or pelvic injury. 3. Comminuted left pubic body fracture with sclerosis and corticated fragments, compatible with an old fracture with nonunion. 4. Small hiatal hernia. 5. Cardiomegaly. 6. Dense mitral valve annulus and coronary artery atherosclerosis. 7. Multiple bilateral renal masses, as described above.  These could represent a combination of simple and complicated cysts and/or solid masses. Electronically Signed   By: Claudie Revering M.D.   On: 05/27/2019 16:23   Ct Head Wo Contrast  Result Date: 05/27/2019 CLINICAL DATA:  Syncopal episode and confusion today. Fell yesterday. EXAM: CT HEAD WITHOUT CONTRAST TECHNIQUE: Contiguous axial images were obtained from the base of the skull through the vertex without intravenous contrast. COMPARISON:  None. FINDINGS: Brain: Moderately enlarged ventricles and subarachnoid spaces. Moderate patchy white matter low density in both cerebral hemispheres. Old left thalamus lacunar infarct. No intracranial hemorrhage, mass lesion or CT evidence of acute infarction. Vascular: No hyperdense vessel or unexpected calcification. Skull: Normal. Negative for fracture or focal lesion. Sinuses/Orbits: Status post bilateral cataract extraction. Unremarkable bones and included paranasal sinuses. Other: None. IMPRESSION: 1. No acute abnormality. 2. Moderate diffuse cerebral and cerebellar atrophy. 3. Moderate chronic small vessel white matter ischemic changes in both cerebral hemispheres. 4. Old left thalamus lacunar infarct. Electronically Signed   By: Claudie Revering M.D.   On: 05/27/2019 16:06   Dg Chest Portable 1 View  Result Date: 05/27/2019 CLINICAL DATA:  Syncope, shortness of breath, known right humeral fracture EXAM: PORTABLE CHEST 1 VIEW COMPARISON:  Humeral radiograph 1 day prior, chest radiograph 09/22/2017 FINDINGS: Redemonstration of the impacted and angulated fracture involving the surgical neck of the right humerus with some increasing inferior displacement of the humeral head relative to the glenoid, possibly reflecting a shoulder effusion. Elevation of the right hemidiaphragm is similar to comparison radiographs. There streaky atelectatic changes. Mild diffuse airways thickening is present. No consolidation, features of edema, pneumothorax, or effusion. The aorta is  calcified. The remaining cardiomediastinal contours are unremarkable. Post mastectomy changes involving the left chest wall and axilla. IMPRESSION: Atelectasis and mild bronchitic changes. Chronic elevation of the right hemidiaphragm. Redemonstration of the impacted and angulated fracture of the right humeral surgical neck with increasing inferior displacement possibly reflecting developing effusion. Electronically Signed   By: Lovena Le M.D.   On: 05/27/2019 14:03   Dg Humerus Right  Result Date: 05/26/2019 CLINICAL DATA:  [place] at home, upper arm pain EXAM: RIGHT HUMERUS - 2+ VIEW COMPARISON:  None. FINDINGS: Acute fracture involving the right humeral neck with impaction and mild posterior angulation of distal fracture fragment. Humeral head appears to project over the glenoid. IMPRESSION: Acute impacted and slightly angulated fracture involving the right humeral neck Electronically Signed   By: Donavan Foil M.D.   On: 05/26/2019 13:40    Lab Data:  CBC: Recent Labs  Lab 05/27/19 1246 05/27/19 1922 05/28/19 0255  WBC 11.7* 13.5* 12.6*  NEUTROABS 10.5*  --   --   HGB 9.9* 10.2*  9.1*  HCT 32.1* 32.3* 28.7*  MCV 89.4 89.2 88.0  PLT 237 270 595   Basic Metabolic Panel: Recent Labs  Lab 05/27/19 1246 05/27/19 1922 05/28/19 0255  NA 138  --  138  K 3.8  --  3.9  CL 105  --  106  CO2 20*  --  18*  GLUCOSE 110*  --  125*  BUN 63*  --  71*  CREATININE 2.91* 3.17* 3.09*  CALCIUM 8.3*  --  8.2*   GFR: Estimated Creatinine Clearance: 9.2 mL/min (A) (by C-G formula based on SCr of 3.09 mg/dL (H)). Liver Function Tests: Recent Labs  Lab 05/27/19 1246  AST 38  ALT 25  ALKPHOS 228*  BILITOT 0.4  PROT 6.1*  ALBUMIN 2.7*   No results for input(s): LIPASE, AMYLASE in the last 168 hours. No results for input(s): AMMONIA in the last 168 hours. Coagulation Profile: No results for input(s): INR, PROTIME in the last 168 hours. Cardiac Enzymes: No results for input(s): CKTOTAL,  CKMB, CKMBINDEX, TROPONINI in the last 168 hours. BNP (last 3 results) No results for input(s): PROBNP in the last 8760 hours. HbA1C: No results for input(s): HGBA1C in the last 72 hours. CBG: No results for input(s): GLUCAP in the last 168 hours. Lipid Profile: No results for input(s): CHOL, HDL, LDLCALC, TRIG, CHOLHDL, LDLDIRECT in the last 72 hours. Thyroid Function Tests: No results for input(s): TSH, T4TOTAL, FREET4, T3FREE, THYROIDAB in the last 72 hours. Anemia Panel: No results for input(s): VITAMINB12, FOLATE, FERRITIN, TIBC, IRON, RETICCTPCT in the last 72 hours. Urine analysis:    Component Value Date/Time   COLORURINE YELLOW 05/28/2019 0249   APPEARANCEUR HAZY (A) 05/28/2019 0249   LABSPEC 1.015 05/28/2019 0249   PHURINE 5.0 05/28/2019 0249   GLUCOSEU NEGATIVE 05/28/2019 0249   HGBUR NEGATIVE 05/28/2019 0249   BILIRUBINUR NEGATIVE 05/28/2019 0249   KETONESUR NEGATIVE 05/28/2019 0249   PROTEINUR NEGATIVE 05/28/2019 0249   NITRITE NEGATIVE 05/28/2019 0249   LEUKOCYTESUR TRACE (A) 05/28/2019 0249     Tamyia Minich M.D. [facility] 05/28/2019, 8:12 AM  Pager: [phone] Between 7am to 7pm - call Pager - [phone]  After 7pm go to [email or web address] - password [email or web address]  Call night coverage person covering after 7pm

## 2019-05-28 NOTE — TOC Initial Note (Signed)
Transition of Care [facility]) - Initial/Assessment Note    Patient Details  Name: Mary Guzman MRN: [id number] Date of Birth: 07-13-1921  [facility] CM/SW Contact:    Alexander [place], [place] Phone Number: 05/28/2019, 12:55 PM  Clinical Narrative:                 CSW spoke with pt daughter via telephone. Pt working with OT at this time. CSW introduced self, role, reason for visit. Pt recently discharged from [facility] and returned less than 24 hrs later. She was set up with [facility] but never initiated due to readmit. Pt from home with the assist of her daughter and son. She usually ambulates without much assistance but with her arm in a sling and back to back admissions pt daughter states that perhaps SNF placement may be best for her to "work back up her strength." CSW received permission to send referrals to SNF. Will need Health Team Authorization for SNF placement as well.   Continue to follow.  Expected Discharge Plan: Skilled Nursing Facility Barriers to Discharge: [place] (PASRR), Insurance Authorization, Continued Medical Work up   Patient Goals and CMS Choice Patient states their goals for this hospitalization and ongoing recovery are:: for her to get stronger and be able to do for herself again CMS Medicare.gov Compare Post Acute Care list provided to:: Patient Represenative (must comment)(pt daughter) Choice offered to / list presented to : Adult Children  Expected Discharge Plan and Services Expected Discharge Plan: [place] In-house Referral: Clinical Social Work Discharge Planning Services: CM Consult Living arrangements for the past 2 months: [place]  Prior Living Arrangements/Services Living arrangements for the past 2 months: [place] Lives with:: Adult Children Patient language and need for interpreter reviewed:: Yes(no needs) Do you feel safe going back to the place where you live?: Yes       Need for Family Participation in Patient Care: Yes (Comment)(assistance with ADL/IADLs; supervision) Care giver support system in place?: Yes (comment)(adult children) Current home services: DME Criminal Activity/Legal Involvement Pertinent to Current Situation/Hospitalization: No - Comment as needed  Activities of Daily Living Home Assistive Devices/Equipment: [place] Rile (specify type) ADL Screening (condition at time of admission) Patient's cognitive ability adequate to safely complete daily activities?: Yes Is the patient deaf or have difficulty hearing?: No Does the patient have difficulty seeing, even when wearing glasses/contacts?: No Does the patient have difficulty concentrating, remembering, or making decisions?: No Patient able to express need for assistance with ADLs?: Yes Does the patient have difficulty dressing or bathing?: Yes Independently performs ADLs?: No Communication: Independent Dressing (OT): Needs assistance Is this a change from baseline?: Pre-admission baseline Grooming: Needs assistance Is this a change from baseline?: Pre-admission baseline Feeding: Needs assistance Is this a change from baseline?: Pre-admission baseline Bathing: Needs assistance Is this a change from baseline?: Pre-admission baseline Toileting: Needs assistance Is this a change from baseline?: Pre-admission baseline In/Out Bed: Needs assistance Is this a change from baseline?: Pre-admission baseline Walks in Home: Needs assistance Is this a change from baseline?: Pre-admission baseline Does the patient have difficulty walking or climbing stairs?: Yes Weakness of Legs: Both Weakness of Arms/Hands: Both  Permission Sought/Granted Permission sought to share information with : Family Supports, Chartered certified accountant granted to share information with : Yes, Verbal Permission Granted  Share Information with NAME: Laurena Spies  Permission granted to share info w AGENCY:  SNFs  Permission granted to share info w  Relationship: daughter  Permission granted to share info w Contact Information: [phone]  Emotional Assessment Appearance:: Other (Comment Required(telephonic assessment) Attitude/Demeanor/Rapport: Other (comment)(telephonic assessment) Affect (typically observed): Other (comment)(telephonic assessment) Orientation: : Oriented to Self, Oriented to Place, Oriented to Situation, Fluctuating Orientation (Suspected and/or reported Sundowners) Alcohol / Substance Use: Not Applicable Psych Involvement: No (comment)  Admission diagnosis:  Acute appendicitis, unspecified acute appendicitis type [K35.80] Syncope, unspecified syncope type [R55] Patient Active Problem List   Diagnosis Date Noted  . Appendicitis 05/27/2019  . Right humeral fracture 05/27/2019  . Deficiency anemia 06/02/2018  . Chronic kidney disease (CKD), stage IV (severe) ([place]) 06/02/2018  . Iron deficiency anemia due to chronic blood loss 06/01/2018  . Anemia in chronic kidney disease 07/02/2016  . CKD (chronic kidney disease), stage III ([place]) 07/29/2015  . Chronic systolic CHF (congestive heart failure) ([place]) 07/16/2015  . Acute on chronic systolic heart failure ([place]) 06/30/2015  . Bilateral leg edema 07/27/2014  . Chronic kidney disease 06/07/2013  . Essential hypertension 07/03/2009  . Hypothyroidism 03/16/2007  . GERD 03/16/2007  . Osteoarthritis 03/16/2007  . Osteoporosis 03/16/2007  . BREAST CANCER, HX OF 03/16/2007   PCP:  [name], NP Pharmacy:   [facility] ([place]) [place], [place] [address] Phone: [phone] Fax: [phone]  [facility] [place], [place] - [address] AT [place] [address] Phone: [phone] Fax: [phone]     Social Determinants of Health (SDOH) Interventions    Readmission Risk  Interventions No flowsheet data found.

## 2019-05-28 NOTE — Progress Notes (Signed)
PT given a bed bath. Offered pt assistance to get up and sit in the recliner for a few min. Pt refused. Updated pt's daughter over the phone.

## 2019-05-29 DIAGNOSIS — S42201D Unspecified fracture of upper end of right humerus, subsequent encounter for fracture with routine healing: Secondary | ICD-10-CM

## 2019-05-29 LAB — CBC
HCT: 27.3 % — ABNORMAL LOW (ref 36.0–46.0)
Hemoglobin: 8.7 g/dL — ABNORMAL LOW (ref 12.0–15.0)
MCH: 27.9 pg (ref 26.0–34.0)
MCHC: 31.9 g/dL (ref 30.0–36.0)
MCV: 87.5 fL (ref 80.0–100.0)
Platelets: 241 10*3/uL (ref 150–400)
RBC: 3.12 MIL/uL — ABNORMAL LOW (ref 3.87–5.11)
RDW: 16.2 % — ABNORMAL HIGH (ref 11.5–15.5)
WBC: 11.5 10*3/uL — ABNORMAL HIGH (ref 4.0–10.5)
nRBC: 0 % (ref 0.0–0.2)

## 2019-05-29 LAB — BASIC METABOLIC PANEL
Anion gap: 12 (ref 5–15)
BUN: 71 mg/dL — ABNORMAL HIGH (ref 8–23)
CO2: 20 mmol/L — ABNORMAL LOW (ref 22–32)
Calcium: 8.1 mg/dL — ABNORMAL LOW (ref 8.9–10.3)
Chloride: 110 mmol/L (ref 98–111)
Creatinine, Ser: 3.35 mg/dL — ABNORMAL HIGH (ref 0.44–1.00)
GFR calc Af Amer: 13 mL/min — ABNORMAL LOW (ref >60)
GFR calc non Af Amer: 11 mL/min — ABNORMAL LOW (ref >60)
Glucose, Bld: 120 mg/dL — ABNORMAL HIGH (ref 70–99)
Potassium: 3.4 mmol/L — ABNORMAL LOW (ref 3.5–5.1)
Sodium: 142 mmol/L (ref 135–145)

## 2019-05-29 NOTE — Progress Notes (Signed)
MD, please contact daughter Brittani Purdum at cell # [phone] or home # [phone] before performing any surgery.  Patient's son would like to see his mother before surgery and the daughter [name] will keep him informed of what is happening with their mother's care.  Thank you.

## 2019-05-29 NOTE — Progress Notes (Addendum)
Subjective/Chief Complaint: CC: abdominal pain and dry mouth, states she is feeling better than yesterday   Pt states she has abdominal pain that she rates a 7/10 this is improved since yesterday. She also complains of right arm pain and says she was told not to move it. Pt denies nausea and vomiting. Pt denies passing flatus, BM, but has voided. Currently she is NPO, and states her mouth is dry. She has not ambulated, but will be moved into a chair by a nurse soon.    Objective: Vital signs in last 24 hours: Temp:  [98.4 F (36.9 C)-99.7 F (37.6 C)] 99.7 F (37.6 C) (09/12 0402) Pulse Rate:  [72-77] 77 (09/12 0402) Resp:  [16-18] 17 (09/12 0402) BP: (113-132)/(54-116) 113/74 (09/12 0402) SpO2:  [88 %-97 %] 94 % (09/12 0402) Weight:  [58 kg] 58 kg (09/12 0500) Last BM Date: 05/26/19  Intake/Output from previous day: 09/11 0701 - 09/12 0700 In: 1311 [I.V.:1195.8; IV Piggyback:115.2] Out: -  Intake/Output this shift: No intake/output data recorded.   General: Frail 85 year old female, oriented x3, and in no acute distress.  CV: Heart sounds regular rate and rhythm, slightly tachycardic.  Pulm: Lungs clear to auscultation in all fields with no wheezes, rhonchi, or rales. GI: No bowel sounds in all quadrants, except for faint Hypoactive bowel sounds in LLQ. TTP in LLQ and suprapubic region.   Lab Results:  Recent Labs    05/28/19 0255 05/29/19 0342  WBC 12.6* 11.5*  HGB 9.1* 8.7*  HCT 28.7* 27.3*  PLT 243 241   BMET Recent Labs    05/28/19 0255 05/29/19 0342  NA 138 142  K 3.9 3.4*  CL 106 110  CO2 18* 20*  GLUCOSE 125* 120*  BUN 71* 71*  CREATININE 3.09* 3.35*  CALCIUM 8.2* 8.1*   PT/INR No results for input(s): LABPROT, INR in the last 72 hours. ABG No results for input(s): PHART, HCO3 in the last 72 hours.  Invalid input(s): PCO2, PO2  Studies/Results: Ct Abdomen Pelvis Wo Contrast  Result Date: 05/27/2019 CLINICAL DATA:  Acute, diffuse abdominal  pain following a fall due to a syncopal episode. EXAM: CT ABDOMEN AND PELVIS WITHOUT CONTRAST TECHNIQUE: Multidetector CT imaging of the abdomen and pelvis was performed following the standard protocol without IV contrast. COMPARISON:  None. FINDINGS: Lower chest: Enlarged heart. Dense mitral valve annulus calcifications found coronary artery calcifications. Minimal linear atelectasis or scarring at the right lung base. Hepatobiliary: No focal liver abnormality is seen. No gallstones, gallbladder wall thickening, or biliary dilatation. Pancreas: Moderate diffuse pancreatic atrophy. Spleen: Normal in size without focal abnormality. Adrenals/Urinary Tract: Normal appearing adrenal glands. Multiple rounded, exophytic bilateral renal masses and left renal parapelvic cysts. The exophytic masses range between water density and a maximum of 34 Hounsfield units in density. Unremarkable urinary bladder. No visible ureteral abnormalities. No calculi or hydronephrosis. Stomach/Bowel: Small hiatal hernia. Enlarged appendix containing multiple appendicoliths. The appendix measures up to 12 mm in diameter. Mild adjacent soft tissue stranding and fat induration. Small focal area of adjacent gas with a somewhat linear configuration. The appendix origin is in the upper pelvis on the right. The appendix extends horizontally toward the midline. Unremarkable small bowel and colon. Vascular/Lymphatic: Atheromatous arterial calcifications without aneurysm. No enlarged lymph nodes. Reproductive: Uterus and bilateral adnexa are unremarkable. Other: No free peritoneal fluid or air. Musculoskeletal: Comminuted left pubic body fracture with sclerosis and corticated fragments. 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 No acute fractures are seen. Left sacral  bone island. IMPRESSION: 1. Acute appendicitis with multiple appendicoliths and a small focal, contained perforation without abscess. 2. No evidence of acute abdominal or pelvic injury. 3. 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 4. Small hiatal hernia. 5. Cardiomegaly. 6. Dense mitral valve annulus and coronary artery atherosclerosis. 7. Multiple bilateral renal masses, as described above. These could represent a combination of simple and complicated cysts and/or solid masses. Electronically Signed   By: Claudie Revering M.D.   On: 05/27/2019 16:23   Ct Head Wo Contrast  Result Date: 05/27/2019 CLINICAL DATA:  Syncopal episode and confusion today. Fell yesterday. EXAM: CT HEAD WITHOUT CONTRAST TECHNIQUE: Contiguous axial images were obtained from the base of the skull through the vertex without intravenous contrast. COMPARISON:  None. FINDINGS: Brain: Moderately enlarged ventricles and subarachnoid spaces. Moderate patchy white matter low density in both cerebral hemispheres. Old left thalamus lacunar infarct. No intracranial hemorrhage, mass lesion or CT evidence of acute infarction. Vascular: No hyperdense vessel or unexpected calcification. Skull: Normal. Negative for fracture or focal lesion. Sinuses/Orbits: Status post bilateral cataract extraction. Unremarkable bones and included paranasal sinuses. Other: None. IMPRESSION: 1. No acute abnormality. 2. Moderate diffuse cerebral and cerebellar atrophy. 3. Moderate chronic small vessel white matter ischemic changes in both cerebral hemispheres. 4. Old left thalamus lacunar infarct. Electronically Signed   By: Claudie Revering M.D.   On: 05/27/2019 16:06   Dg Chest Portable 1 View  Result Date: 05/27/2019 CLINICAL DATA:  Syncope, shortness of breath, known right humeral fracture EXAM: PORTABLE CHEST 1 VIEW COMPARISON:  Humeral radiograph 1 day prior, chest radiograph 09/22/2017 FINDINGS: Redemonstration of the impacted and angulated fracture involving the surgical neck of the right humerus with some increasing inferior displacement of the humeral head relative to the glenoid,  possibly reflecting a shoulder effusion. Elevation of the right hemidiaphragm is similar to comparison radiographs. There streaky atelectatic changes. Mild diffuse airways thickening is present. No consolidation, features of edema, pneumothorax, or effusion. The aorta is calcified. The remaining cardiomediastinal contours are unremarkable. Post mastectomy changes involving the left chest wall and axilla. IMPRESSION: Atelectasis and mild bronchitic changes. Chronic elevation of the right hemidiaphragm. Redemonstration of the impacted and angulated fracture of the right humeral surgical neck with increasing inferior displacement possibly reflecting developing effusion. Electronically Signed   By: Lovena Le M.D.   On: 05/27/2019 14:03    Anti-infectives: Anti-infectives (From admission, onward)   Start     Dose/Rate Route Frequency Ordered Stop   05/28/19 0100  piperacillin-tazobactam (ZOSYN) IVPB 2.25 g     2.25 g 100 mL/hr over 30 Minutes Intravenous Every 8 hours 05/27/19 1831     05/27/19 1815  piperacillin-tazobactam (ZOSYN) IVPB 3.375 g     3.375 g 100 mL/hr over 30 Minutes Intravenous  Once 05/27/19 1814 05/27/19 2048   05/27/19 1630  cefTRIAXone (ROCEPHIN) 1 g in sodium chloride 0.9 % 100 mL IVPB     1 g 200 mL/hr over 30 Minutes Intravenous  Once 05/27/19 1628 05/27/19 1849   05/27/19 1630  metroNIDAZOLE (FLAGYL) IVPB 500 mg     500 mg 100 mL/hr over 60 Minutes Intravenous  Once 05/27/19 1628 05/27/19 2111      Assessment/Plan: Appendicitis with possible perforation, and right proximal humeral fracture.  -recheck wbc in am, continue nonoperative treatment for appendicitis, discussed indications for surgery being failure and not continuing to progress. I am concerned she may need surgery. If  not better next 36-85 hours certainly needs to be considered. If she is managed nonoperatively she is also at risk of recurrence especially with appendicoliths FEN: IV fluids, clears today, npo  after mn in case needs or ID: Zosyn 9/10>> day 3 DVT: Lovenox Follow up: TBD   LOS: 2 days    Renato Gails Allmer 05/29/2019

## 2019-05-29 NOTE — Progress Notes (Signed)
[facility]                                                                              Patient Demographics  Mary Guzman, is a 85 y.o. female, DOB - 07/18/1921, FWY:[id number]  Admit date - 05/27/2019   Admitting Physician Mauricio Gerome Apley, MD  Outpatient Primary MD for the patient is [name], NP  Outpatient specialists:   LOS - 2  days  Chief Complaint  Patient presents with   Loss of Consciousness   Fatigue       Brief summary   Patient is a 85 year old female with history of hypertension, hypothyroidism presented with 2 to 3-day history of lower abdominal pain, dull to colicky nature associated with chills.  No fevers, no nausea vomiting or diarrhea. 24 hours prior to hospitalization, she suffered a syncope episode while standing in the bathroom, she landed on the right side and suffered a right humeral fracture.  She was seen in ED, right shoulder sling was placed and and discharged back home.  Patient continued to have abdominal pain, was able to eat.  On the morning of admission, had an episode of unresponsiveness however recovered her consciousness fairly rapidly.  Patient was brought back to ED.  Extensive work-up showed appendicitis.  Surgery was consulted and recommended conservative management. COVID-19 test negative  Assessment & Plan  Principal Problem:   Appendicitis Active Problems:   Hypothyroidism   Essential hypertension   GERD   Osteoarthritis   Chronic systolic CHF (congestive heart failure) (HCC)   CKD (chronic kidney disease), stage III (HCC)   Right humeral fracture    Acute appendicitis POA, minimally improving: -CT abdomen showed acute appendicitis with multiple appendicoliths, small focal contained perforation without abscess. -Leukocytosis improving, patient does not meet sepsis criteria -Surgery continues to follow, appreciate insight and recommendations; concerned that patient is not progressing as they suspect,  patient may require surgical intervention despite elevated risk given advanced age. Defer to their expertise. -Continue n.p.o., IV fluids, Zosyn started 05/28/2019 -Management per general surgery  Acute kidney injury on CKD (chronic kidney disease), stage III (HCC) -Likely prerenal secondary to dehydration, acute appendicitis -Baseline creatinine 2.5-2.6, trending up, creatinine 3.35 -Continue IV fluids, hold nephrotoxic meds/Lasix  Right humeral fracture, subsequent, POA -Due to recent fall, impacted and slightly angulated fracture, right shoulder sling was placed -Continue pain control, immobilization, tentative outpatient follow-up with orthopedics per their schedule with Dr. [name]  Chronic systolic CHF (congestive heart failure) ([place]), mild troponin elevation -BP soft, continue to hold Lasix, metoprolol -Follow volume status closely, currently euvolemic -Mildly elevated troponin likely due to demand ischemia  Syncope - likely due to acute appendicitis, poor p.o. intake, vasovagal versus orthostatic episode  -Continue IV fluid hydration, no acute issues  Hypothyroidism, stable -Continue IV Synthroid  Essential hypertension -BP soft, continue to hold amlodipine, Lasix, metoprolol  GERD -Continue PPI  Code Status: Full code DVT Prophylaxis:  Lovenox  Disposition Plan: Remains inpatient, high risk of decompensation due to acute appendicitis with contained perforation, advanced age with acute kidney injury, and humeral fracture.  Time Spent in minutes 35 minutes  Procedures:  CT  abdomen  Consultants:   General surgery Orthopedics  Antimicrobials:   Anti-infectives (From admission, onward)   Start     Dose/Rate Route Frequency Ordered Stop   05/28/19 0100  piperacillin-tazobactam (ZOSYN) IVPB 2.25 g     2.25 g 100 mL/hr over 30 Minutes Intravenous Every 8 hours 05/27/19 1831     05/27/19 1815  piperacillin-tazobactam (ZOSYN) IVPB 3.375 g     3.375 g 100 mL/hr over  30 Minutes Intravenous  Once 05/27/19 1814 05/27/19 2048   05/27/19 1630  cefTRIAXone (ROCEPHIN) 1 g in sodium chloride 0.9 % 100 mL IVPB     1 g 200 mL/hr over 30 Minutes Intravenous  Once 05/27/19 1628 05/27/19 1849   05/27/19 1630  metroNIDAZOLE (FLAGYL) IVPB 500 mg     500 mg 100 mL/hr over 60 Minutes Intravenous  Once 05/27/19 1628 05/27/19 2111         Medications  Scheduled Meds:  enoxaparin (LOVENOX) injection  30 mg Subcutaneous Q24H   levothyroxine  37.5 mcg Intravenous Daily   mouth rinse  15 mL Mouth Rinse BID   pantoprazole (PROTONIX) IV  40 mg Intravenous QHS   Continuous Infusions:  dextrose 5% lactated ringers Stopped (05/28/19 1642)   piperacillin-tazobactam (ZOSYN)  IV 2.25 g (05/29/19 0036)   PRN Meds:.acetaminophen **OR** acetaminophen, hydroxypropyl methylcellulose / hypromellose, morphine injection, ondansetron **OR** ondansetron (ZOFRAN) IV      Subjective:   Mary Guzman was seen and examined today.  Still feeling abdominal pain and tenderness, 5/10.  No fevers or chills, no nausea or vomiting.  Patient denies dizziness, chest pain, shortness of breath,  new weakness, numbess, tingling. No acute events overnight.    Objective:   Vitals:   05/28/19 1312 05/28/19 2038 05/29/19 0402 05/29/19 0500  BP: (!) 122/54 (!) 132/116 113/74   Pulse: 74 72 77   Resp: 18 16 17    Temp: 98.4 F (36.9 C) 98.9 F (37.2 C) 99.7 F (37.6 C)   TempSrc: Oral  Oral   SpO2: 97% (!) 88% 94%   Weight:    58 kg  Height:        Intake/Output Summary (Last 24 hours) at 05/29/2019 9735 Last data filed at 05/29/2019 0755 Gross per 24 hour  Intake 1311 ml  Output --  Net 1311 ml     Wt Readings from Last 3 Encounters:  05/29/19 58 kg  03/18/19 57.8 kg  11/05/18 58.2 kg     Exam  General: Alert and oriented x 3, NAD  Eyes:   HEENT:  Atraumatic, normocephalic,  Cardiovascular: S1 S2 auscultated, no murmurs, RRR  Respiratory: Clear to auscultation  bilaterally, no wheezing, rales or rhonchi  Gastrointestinal: Soft, TTP right lower quadrant, rebound TTP in left lower quadrant , nondistended, + bowel sounds  Ext: no pedal edema bilaterally, right shoulder sling +  Neuro: No new deficit  Musculoskeletal: No digital cyanosis, clubbing  Skin: No rashes  Psych: Normal affect and demeanor, alert and oriented x3    Data Reviewed:  I have personally reviewed following labs and imaging studies  Micro Results Recent Results (from the past 240 hour(s))  SARS Coronavirus 2 [facility] order, Performed in [place] hospital lab) Nasopharyngeal Nasopharyngeal Swab     Status: None   Collection Time: 05/27/19  6:26 PM   Specimen: Nasopharyngeal Swab  Result Value Ref Range Status   SARS Coronavirus 2 NEGATIVE NEGATIVE Final    Comment: (NOTE) If result is NEGATIVE SARS-CoV-2 target nucleic acids are  NOT DETECTED. The SARS-CoV-2 RNA is generally detectable in upper and lower  respiratory specimens during the acute phase of infection. The lowest  concentration of SARS-CoV-2 viral copies this assay can detect is 250  copies / mL. A negative result does not preclude SARS-CoV-2 infection  and should not be used as the sole basis for treatment or other  patient management decisions.  A negative result may occur with  improper specimen collection / handling, submission of specimen other  than nasopharyngeal swab, presence of viral mutation(s) within the  areas targeted by this assay, and inadequate number of viral copies  (<250 copies / mL). A negative result must be combined with clinical  observations, patient history, and epidemiological information. If result is POSITIVE SARS-CoV-2 target nucleic acids are DETECTED. The SARS-CoV-2 RNA is generally detectable in upper and lower  respiratory specimens dur ing the acute phase of infection.  Positive  results are indicative of active infection with SARS-CoV-2.  Clinical  correlation with  patient history and other diagnostic information is  necessary to determine patient infection status.  Positive results do  not rule out bacterial infection or co-infection with other viruses. If result is PRESUMPTIVE POSTIVE SARS-CoV-2 nucleic acids MAY BE PRESENT.   A presumptive positive result was obtained on the submitted specimen  and confirmed on repeat testing.  While 2019 novel coronavirus  (SARS-CoV-2) nucleic acids may be present in the submitted sample  additional confirmatory testing may be necessary for epidemiological  and / or clinical management purposes  to differentiate between  SARS-CoV-2 and other Sarbecovirus currently known to infect humans.  If clinically indicated additional testing with an alternate test  methodology [phone]) is advised. The SARS-CoV-2 RNA is generally  detectable in upper and lower respiratory sp ecimens during the acute  phase of infection. The expected result is Negative. Fact Sheet for Patients:  StrictlyIdeas.no Fact Sheet for Healthcare Providers: BankingDealers.co.za This test is not yet approved or cleared by the [place] FDA and has been authorized for detection and/or diagnosis of SARS-CoV-2 by FDA under an Emergency Use Authorization (EUA).  This EUA will remain in effect (meaning this test can be used) for the duration of the COVID-19 declaration under Section 564(b)(1) of the Act, 21 U.S.C. section 360bbb-3(b)(1), unless the authorization is terminated or revoked sooner. Performed at [facility], [place] [address] [place], [place] [address]     Radiology Reports Ct Abdomen Pelvis Wo Contrast  Result Date: 05/27/2019 CLINICAL DATA:  Acute, diffuse abdominal pain following a fall due to a syncopal episode. EXAM: CT ABDOMEN AND PELVIS WITHOUT CONTRAST TECHNIQUE: Multidetector CT imaging of the abdomen and pelvis was performed following the standard protocol without IV  contrast. COMPARISON:  None. FINDINGS: Lower chest: Enlarged heart. Dense mitral valve annulus calcifications found coronary artery calcifications. Minimal linear atelectasis or scarring at the right lung base. Hepatobiliary: No focal liver abnormality is seen. No gallstones, gallbladder wall thickening, or biliary dilatation. Pancreas: Moderate diffuse pancreatic atrophy. Spleen: Normal in size without focal abnormality. Adrenals/Urinary Tract: Normal appearing adrenal glands. Multiple rounded, exophytic bilateral renal masses and left renal parapelvic cysts. The exophytic masses range between water density and a maximum of 34 Hounsfield units in density. Unremarkable urinary bladder. No visible ureteral abnormalities. No calculi or hydronephrosis. Stomach/Bowel: Small hiatal hernia. Enlarged appendix containing multiple appendicoliths. The appendix measures up to 12 mm in diameter. Mild adjacent soft tissue stranding and fat induration. Small focal area of adjacent gas with a somewhat linear  configuration. The appendix origin is in the upper pelvis on the right. The appendix extends horizontally toward the midline. Unremarkable small bowel and colon. Vascular/Lymphatic: Atheromatous arterial calcifications without aneurysm. No enlarged lymph nodes. Reproductive: Uterus and bilateral adnexa are unremarkable. Other: No free peritoneal fluid or air. Musculoskeletal: Comminuted left pubic body fracture with sclerosis and corticated fragments. 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 No acute fractures are seen. Left sacral bone island. IMPRESSION: 1. Acute appendicitis with multiple appendicoliths and a small focal, contained perforation without abscess. 2. No evidence of acute abdominal or pelvic injury. 3. Comminuted left pubic body fracture with sclerosis and corticated fragments, compatible with an old fracture with nonunion. 4. Small hiatal hernia. 5. Cardiomegaly. 6. Dense mitral valve annulus and coronary artery  atherosclerosis. 7. Multiple bilateral renal masses, as described above. These could represent a combination of simple and complicated cysts and/or solid masses. Electronically Signed   By: Claudie Revering M.D.   On: 05/27/2019 16:23   Ct Head Wo Contrast  Result Date: 05/27/2019 CLINICAL DATA:  Syncopal episode and confusion today. Fell yesterday. EXAM: CT HEAD WITHOUT CONTRAST TECHNIQUE: Contiguous axial images were obtained from the base of the skull through the vertex without intravenous contrast. COMPARISON:  None. FINDINGS: Brain: Moderately enlarged ventricles and subarachnoid spaces. Moderate patchy white matter low density in both cerebral hemispheres. Old left thalamus lacunar infarct. No intracranial hemorrhage, mass lesion or CT evidence of acute infarction. Vascular: No hyperdense vessel or unexpected calcification. Skull: Normal. Negative for fracture or focal lesion. Sinuses/Orbits: Status post bilateral cataract extraction. Unremarkable bones and included paranasal sinuses. Other: None. IMPRESSION: 1. No acute abnormality. 2. Moderate diffuse cerebral and cerebellar atrophy. 3. Moderate chronic small vessel white matter ischemic changes in both cerebral hemispheres. 4. Old left thalamus lacunar infarct. Electronically Signed   By: Claudie Revering M.D.   On: 05/27/2019 16:06   Dg Chest Portable 1 View  Result Date: 05/27/2019 CLINICAL DATA:  Syncope, shortness of breath, known right humeral fracture EXAM: PORTABLE CHEST 1 VIEW COMPARISON:  Humeral radiograph 1 day prior, chest radiograph 09/22/2017 FINDINGS: Redemonstration of the impacted and angulated fracture involving the surgical neck of the right humerus with some increasing inferior displacement of the humeral head relative to the glenoid, possibly reflecting a shoulder effusion. Elevation of the right hemidiaphragm is similar to comparison radiographs. There streaky atelectatic changes. Mild diffuse airways thickening is present. No  consolidation, features of edema, pneumothorax, or effusion. The aorta is calcified. The remaining cardiomediastinal contours are unremarkable. Post mastectomy changes involving the left chest wall and axilla. IMPRESSION: Atelectasis and mild bronchitic changes. Chronic elevation of the right hemidiaphragm. Redemonstration of the impacted and angulated fracture of the right humeral surgical neck with increasing inferior displacement possibly reflecting developing effusion. Electronically Signed   By: Lovena Le M.D.   On: 05/27/2019 14:03   Dg Humerus Right  Result Date: 05/26/2019 CLINICAL DATA:  [place] at home, upper arm pain EXAM: RIGHT HUMERUS - 2+ VIEW COMPARISON:  None. FINDINGS: Acute fracture involving the right humeral neck with impaction and mild posterior angulation of distal fracture fragment. Humeral head appears to project over the glenoid. IMPRESSION: Acute impacted and slightly angulated fracture involving the right humeral neck Electronically Signed   By: Donavan Foil M.D.   On: 05/26/2019 13:40    Lab Data:  CBC: Recent Labs  Lab 05/27/19 1246 05/27/19 1922 05/28/19 0255 05/29/19 0342  WBC 11.7* 13.5* 12.6* 11.5*  NEUTROABS 10.5*  --   --   --  HGB 9.9* 10.2* 9.1* 8.7*  HCT 32.1* 32.3* 28.7* 27.3*  MCV 89.4 89.2 88.0 87.5  PLT 237 270 243 161   Basic Metabolic Panel: Recent Labs  Lab 05/27/19 1246 05/27/19 1922 05/28/19 0255 05/29/19 0342  NA 138  --  138 142  K 3.8  --  3.9 3.4*  CL 105  --  106 110  CO2 20*  --  18* 20*  GLUCOSE 110*  --  125* 120*  BUN 63*  --  71* 71*  CREATININE 2.91* 3.17* 3.09* 3.35*  CALCIUM 8.3*  --  8.2* 8.1*   GFR: Estimated Creatinine Clearance: 8.5 mL/min (A) (by C-G formula based on SCr of 3.35 mg/dL (H)). Liver Function Tests: Recent Labs  Lab 05/27/19 1246  AST 38  ALT 25  ALKPHOS 228*  BILITOT 0.4  PROT 6.1*  ALBUMIN 2.7*   No results for input(s): LIPASE, AMYLASE in the last 168 hours. No results for input(s):  AMMONIA in the last 168 hours. Coagulation Profile: No results for input(s): INR, PROTIME in the last 168 hours. Cardiac Enzymes: No results for input(s): CKTOTAL, CKMB, CKMBINDEX, TROPONINI in the last 168 hours. BNP (last 3 results) No results for input(s): PROBNP in the last 8760 hours. HbA1C: No results for input(s): HGBA1C in the last 72 hours. CBG: No results for input(s): GLUCAP in the last 168 hours. Lipid Profile: No results for input(s): CHOL, HDL, LDLCALC, TRIG, CHOLHDL, LDLDIRECT in the last 72 hours. Thyroid Function Tests: No results for input(s): TSH, T4TOTAL, FREET4, T3FREE, THYROIDAB in the last 72 hours. Anemia Panel: No results for input(s): VITAMINB12, FOLATE, FERRITIN, TIBC, IRON, RETICCTPCT in the last 72 hours. Urine analysis:    Component Value Date/Time   COLORURINE YELLOW 05/28/2019 0249   APPEARANCEUR HAZY (A) 05/28/2019 0249   LABSPEC 1.015 05/28/2019 0249   PHURINE 5.0 05/28/2019 0249   GLUCOSEU NEGATIVE 05/28/2019 0249   HGBUR NEGATIVE 05/28/2019 0249   BILIRUBINUR NEGATIVE 05/28/2019 0249   KETONESUR NEGATIVE 05/28/2019 0249   PROTEINUR NEGATIVE 05/28/2019 0249   NITRITE NEGATIVE 05/28/2019 0249   LEUKOCYTESUR TRACE (A) 05/28/2019 0249     Little Ishikawa M.D. [facility] 05/29/2019, 8:08 AM  Pager: [phone] Between 7am to 7pm - call Pager - [phone]  After 7pm go to [email or web address] - password [email or web address]  Call night coverage person covering after 7pm

## 2019-05-30 ENCOUNTER — Inpatient Hospital Stay (HOSPITAL_COMMUNITY): Payer: PPO

## 2019-05-30 DIAGNOSIS — I10 Essential (primary) hypertension: Secondary | ICD-10-CM

## 2019-05-30 DIAGNOSIS — E038 Other specified hypothyroidism: Secondary | ICD-10-CM

## 2019-05-30 LAB — BASIC METABOLIC PANEL
Anion gap: 11 (ref 5–15)
BUN: 70 mg/dL — ABNORMAL HIGH (ref 8–23)
CO2: 20 mmol/L — ABNORMAL LOW (ref 22–32)
Calcium: 7.8 mg/dL — ABNORMAL LOW (ref 8.9–10.3)
Chloride: 106 mmol/L (ref 98–111)
Creatinine, Ser: 3.33 mg/dL — ABNORMAL HIGH (ref 0.44–1.00)
GFR calc Af Amer: 13 mL/min — ABNORMAL LOW (ref >60)
GFR calc non Af Amer: 11 mL/min — ABNORMAL LOW (ref >60)
Glucose, Bld: 125 mg/dL — ABNORMAL HIGH (ref 70–99)
Potassium: 3.7 mmol/L (ref 3.5–5.1)
Sodium: 137 mmol/L (ref 135–145)

## 2019-05-30 LAB — CBC
HCT: 26.4 % — ABNORMAL LOW (ref 36.0–46.0)
Hemoglobin: 8.3 g/dL — ABNORMAL LOW (ref 12.0–15.0)
MCH: 27.4 pg (ref 26.0–34.0)
MCHC: 31.4 g/dL (ref 30.0–36.0)
MCV: 87.1 fL (ref 80.0–100.0)
Platelets: 247 10*3/uL (ref 150–400)
RBC: 3.03 MIL/uL — ABNORMAL LOW (ref 3.87–5.11)
RDW: 16.5 % — ABNORMAL HIGH (ref 11.5–15.5)
WBC: 12.1 10*3/uL — ABNORMAL HIGH (ref 4.0–10.5)
nRBC: 0 % (ref 0.0–0.2)

## 2019-05-30 NOTE — Progress Notes (Signed)
PROGRESS NOTE    SILVA AAMODT  XBL:[id number] DOB: 10/29/1920 DOA: 05/27/2019 PCP: [name], NP    Brief Narrative:   85 year old lady with prior history of hypertension hypothyroidism presented to ED on 9/10/20204 lower abdominal pain and right arm pain.  CT of the abdomen and pelvis showed. Acute appendicitis with multiple appendicoliths and a small focal, contained perforation without abscess.  24 hours prior to the arrival to ED on 9/10, pt had a syncopal episode and fell on her right arm.  X-rays of the right arm shows Acute impacted and slightly angulated fracture involving the right humeral neck. Orthopedics consulted and recommended NWB in sling and follow up with Dr [name] in the office in 2 to 3 weeks.   Surgery consulted for the acute appendicitis and recommended non operative treatment with IV antibiotics and if she does not improve she might need surgery.   Pt seen and examined today, she continues to have significant pain in the lower abdomen, persistent to worsening leukocytosis. Plan for repeat CT abd and pelvis today and further recommendations to follow .     Assessment & Plan:   Principal Problem:   Appendicitis Active Problems:   Hypothyroidism   Essential hypertension   GERD   Osteoarthritis   Chronic systolic CHF (congestive heart failure) (HCC)   CKD (chronic kidney disease), stage III (HCC)   Right humeral fracture   Acute appendicitis with contained perforation Not much improvement since admission.  Patient continues to have significant abdominal pain with some nausea but without any vomiting.  She remains afebrile but leukocytosis is persistent.  Surgery consulted and recommended to watch on IV antibiotics and evaluated today they recommended getting a repeat CT abdomen and pelvis and further recommendations to follow Meanwhile continue with IV fluids and IV Zosyn and patient remains n.p.o. Further management and recommendations per general surgery.     Right humeral fracture Secondary to a fall.  Right shoulder sling was placed and nonweightbearing bearing recommended by orthopedics.  Outpatient follow-up with Dr. [name] on discharge. Continue with pain control for now and nonweightbearing on the right side.   Syncope Probably secondary to orthostatic versus vasovagal from acute appendicitis. Continue to monitor and will probably get a PT/OT evaluation after her abdominal issues have improved.    Acute on stage III CKD Baseline creatinine around 2.5 and creatinine has worsened to 3.3.  Probably secondary to ATN/dehydration. Get renal electrolytes and continue to hold nephrotoxins/Lasix.  Continue with gentle hydration and repeat renal parameters in the morning.  Discussed the worsening of her renal parameters with the patient's daughter at bedside.   Chronic diastolic heart failure No signs of fluid overload.  Last echocardiogram from 2018 showedWall thickness was   increased in a pattern of moderate LVH. The estimated ejection  fraction was 55%. Wall motion was normal; there were no regional   wall motion abnormalities. Septal-lateral dyssynchrony. Doppler  parameters are consistent with abnormal left ventricular   relaxation (grade 1 diastolic dysfunction). Lasix is on hold for AKI.   Hypothyroidism continue with Synthroid.    Anemia of chronic disease/anemia of acute illness. Baseline hemoglobin between 9-10 currently her hemoglobin is at 8.3. Anemia panel will be sent.  Transfuse to keep hemoglobin greater than 7.   Elevated troponins probably from demand ischemia from acute appendicitis.   GERD stable    Hypertension Patient's blood pressure parameters were soft on admission hence Norvasc metoprolol and Lasix were held.  If her blood pressure remained  stable we can restart the beta-blocker.  DVT prophylaxis: Lovenox Code Status: DNR Family Communication: Daughter at bedside  Disposition Plan: Pending further  evaluation by surgery   Consultants:   General surgery.   Procedures: CT abd and pelvis.   Antimicrobials: zosyn since admission.   Subjective: Daughter at bedside, wants his brother to visit the patient.  Pt is weak, no vomiting, but nauseated and with persistent pain.    Objective: Vitals:   05/29/19 0500 05/29/19 1335 05/29/19 2131 05/30/19 0442  BP:  (!) 104/55 (!) 126/52 (!) 123/58  Pulse:  73 87 78  Resp:  17 16 (!) 22  Temp:  97.9 F (36.6 C) 98.4 F (36.9 C) 98.1 F (36.7 C)  TempSrc:  Oral Oral Oral  SpO2:  92% 96% 98%  Weight: 58 kg     Height:        Intake/Output Summary (Last 24 hours) at 05/30/2019 1258 Last data filed at 05/30/2019 0754 Gross per 24 hour  Intake 1845.9 ml  Output 300 ml  Net 1545.9 ml   Filed Weights   05/27/19 1207 05/28/19 0500 05/29/19 0500  Weight: 57.8 kg 58 kg 58 kg    Examination:  General exam: no distress noted. Some discomfort on moving.  Respiratory system: Clear to auscultation. Respiratory effort normal. Cardiovascular system: S1 & S2 heard, RRR.  No pedal edema. Gastrointestinal system: Abdomen is soft , tender generalized more in the RLQ, no signs of peritonitis. Bowel sounds are good.  Central nervous system: Alert and answering questions appropriately. Grossly non focal.  Extremities: no pedal edema.  Skin: No rashes, lesions or ulcers Psychiatry: mood appropriate.    Data Reviewed: I have personally reviewed following labs and imaging studies  CBC: Recent Labs  Lab 05/27/19 1246 05/27/19 1922 05/28/19 0255 05/29/19 0342 05/30/19 0307  WBC 11.7* 13.5* 12.6* 11.5* 12.1*  NEUTROABS 10.5*  --   --   --   --   HGB 9.9* 10.2* 9.1* 8.7* 8.3*  HCT 32.1* 32.3* 28.7* 27.3* 26.4*  MCV 89.4 89.2 88.0 87.5 87.1  PLT 237 270 243 241 295   Basic Metabolic Panel: Recent Labs  Lab 05/27/19 1246 05/27/19 1922 05/28/19 0255 05/29/19 0342 05/30/19 0307  NA 138  --  138 142 137  K 3.8  --  3.9 3.4* 3.7  CL  105  --  106 110 106  CO2 20*  --  18* 20* 20*  GLUCOSE 110*  --  125* 120* 125*  BUN 63*  --  71* 71* 70*  CREATININE 2.91* 3.17* 3.09* 3.35* 3.33*  CALCIUM 8.3*  --  8.2* 8.1* 7.8*   GFR: Estimated Creatinine Clearance: 8.5 mL/min (A) (by C-G formula based on SCr of 3.33 mg/dL (H)). Liver Function Tests: Recent Labs  Lab 05/27/19 1246  AST 38  ALT 25  ALKPHOS 228*  BILITOT 0.4  PROT 6.1*  ALBUMIN 2.7*   No results for input(s): LIPASE, AMYLASE in the last 168 hours. No results for input(s): AMMONIA in the last 168 hours. Coagulation Profile: No results for input(s): INR, PROTIME in the last 168 hours. Cardiac Enzymes: No results for input(s): CKTOTAL, CKMB, CKMBINDEX, TROPONINI in the last 168 hours. BNP (last 3 results) No results for input(s): PROBNP in the last 8760 hours. HbA1C: No results for input(s): HGBA1C in the last 72 hours. CBG: No results for input(s): GLUCAP in the last 168 hours. Lipid Profile: No results for input(s): CHOL, HDL, LDLCALC, TRIG, CHOLHDL, LDLDIRECT in the  last 72 hours. Thyroid Function Tests: No results for input(s): TSH, T4TOTAL, FREET4, T3FREE, THYROIDAB in the last 72 hours. Anemia Panel: No results for input(s): VITAMINB12, FOLATE, FERRITIN, TIBC, IRON, RETICCTPCT in the last 72 hours. Sepsis Labs: No results for input(s): PROCALCITON, LATICACIDVEN in the last 168 hours.  Recent Results (from the past 240 hour(s))  SARS Coronavirus 2 [facility] order, Performed in [facility] hospital lab) Nasopharyngeal Nasopharyngeal Swab     Status: None   Collection Time: 05/27/19  6:26 PM   Specimen: Nasopharyngeal Swab  Result Value Ref Range Status   SARS Coronavirus 2 NEGATIVE NEGATIVE Final    Comment: (NOTE) If result is NEGATIVE SARS-CoV-2 target nucleic acids are NOT DETECTED. The SARS-CoV-2 RNA is generally detectable in upper and lower  respiratory specimens during the acute phase of infection. The lowest  concentration of  SARS-CoV-2 viral copies this assay can detect is 250  copies / mL. A negative result does not preclude SARS-CoV-2 infection  and should not be used as the sole basis for treatment or other  patient management decisions.  A negative result may occur with  improper specimen collection / handling, submission of specimen other  than nasopharyngeal swab, presence of viral mutation(s) within the  areas targeted by this assay, and inadequate number of viral copies  (<250 copies / mL). A negative result must be combined with clinical  observations, patient history, and epidemiological information. If result is POSITIVE SARS-CoV-2 target nucleic acids are DETECTED. The SARS-CoV-2 RNA is generally detectable in upper and lower  respiratory specimens dur ing the acute phase of infection.  Positive  results are indicative of active infection with SARS-CoV-2.  Clinical  correlation with patient history and other diagnostic information is  necessary to determine patient infection status.  Positive results do  not rule out bacterial infection or co-infection with other viruses. If result is PRESUMPTIVE POSTIVE SARS-CoV-2 nucleic acids MAY BE PRESENT.   A presumptive positive result was obtained on the submitted specimen  and confirmed on repeat testing.  While 2019 novel coronavirus  (SARS-CoV-2) nucleic acids may be present in the submitted sample  additional confirmatory testing may be necessary for epidemiological  and / or clinical management purposes  to differentiate between  SARS-CoV-2 and other Sarbecovirus currently known to infect humans.  If clinically indicated additional testing with an alternate test  methodology [phone]) is advised. The SARS-CoV-2 RNA is generally  detectable in upper and lower respiratory sp ecimens during the acute  phase of infection. The expected result is Negative. Fact Sheet for Patients:  StrictlyIdeas.no Fact Sheet for Healthcare  Providers: BankingDealers.co.za This test is not yet approved or cleared by the [place] FDA and has been authorized for detection and/or diagnosis of SARS-CoV-2 by FDA under an Emergency Use Authorization (EUA).  This EUA will remain in effect (meaning this test can be used) for the duration of the COVID-19 declaration under Section 564(b)(1) of the Act, 21 U.S.C. section 360bbb-3(b)(1), unless the authorization is terminated or revoked sooner. Performed at [facility], [place] [address] [place], [place] [address]          Radiology Studies: No results found.      Scheduled Meds: . enoxaparin (LOVENOX) injection  30 mg Subcutaneous Q24H  . levothyroxine  37.5 mcg Intravenous Daily  . mouth rinse  15 mL Mouth Rinse BID  . pantoprazole (PROTONIX) IV  40 mg Intravenous QHS   Continuous Infusions: . dextrose 5% lactated ringers 75 mL/hr  at 05/30/19 0333  . piperacillin-tazobactam (ZOSYN)  IV 2.25 g (05/30/19 0908)     LOS: 3 days    Time spent: 35 minutes.     Hosie Poisson, MD [facility] Pager [phone]  If 7PM-7AM, please contact night-coverage [email or web address] Password [facility] 05/30/2019, 12:58 PM

## 2019-05-30 NOTE — Progress Notes (Signed)
   Subjective/Chief Complaint: CC: 85 yr old female complains of abdominal pain and dry mouth, states she is feeling better than yesterday, however she says her pain is 8/10 (she said 7/10 yesterday).  Pt denies nausea and vomiting. Pt denies passing flatus, BM, but has voided. Currently she is NPO, but states she had jello yesterday. Pt denies hunger. Pt states she ambulated across the hall with a nurse yesterday, tolerated well.  Pt states she slept well through the night.    Objective: Vital signs in last 24 hours: Temp:  [97.9 F (36.6 C)-98.4 F (36.9 C)] 98.1 F (36.7 C) (09/13 0442) Pulse Rate:  [73-87] 78 (09/13 0442) Resp:  [16-22] 22 (09/13 0442) BP: (104-126)/(52-58) 123/58 (09/13 0442) SpO2:  [92 %-98 %] 98 % (09/13 0442) Last BM Date: 05/26/19  Intake/Output from previous day: 09/12 0701 - 09/13 0700 In: 1895.9 [P.O.:420; I.V.:1325.9; IV Piggyback:150] Out: 300 [Urine:300] Intake/Output this shift: No intake/output data recorded.  General: 85 year old female, seems slightly fatigued, no acute distress.  CV: Heart sounds regular rate and rhythm.  Pulm: Lungs clear to auscultation in all fields except slight wheeze in left lower lobe. Pt coughed when asked to take a deep breath.  GI: No bowel sounds in all quadrants, except for faint Hypoactive bowel sounds in LLQ. TTP in RLQ, nontender in all other abdominal quadrants.   Lab Results:  Recent Labs    05/29/19 0342 05/30/19 0307  WBC 11.5* 12.1*  HGB 8.7* 8.3*  HCT 27.3* 26.4*  PLT 241 247   BMET Recent Labs    05/29/19 0342 05/30/19 0307  NA 142 137  K 3.4* 3.7  CL 110 106  CO2 20* 20*  GLUCOSE 120* 125*  BUN 71* 70*  CREATININE 3.35* 3.33*  CALCIUM 8.1* 7.8*   PT/INR No results for input(s): LABPROT, INR in the last 72 hours. ABG No results for input(s): PHART, HCO3 in the last 72 hours.  Invalid input(s): PCO2, PO2  Studies/Results: No results found.  Anti-infectives: Anti-infectives  (From admission, onward)   Start     Dose/Rate Route Frequency Ordered Stop   05/28/19 0100  piperacillin-tazobactam (ZOSYN) IVPB 2.25 g     2.25 g 100 mL/hr over 30 Minutes Intravenous Every 8 hours 05/27/19 1831     05/27/19 1815  piperacillin-tazobactam (ZOSYN) IVPB 3.375 g     3.375 g 100 mL/hr over 30 Minutes Intravenous  Once 05/27/19 1814 05/27/19 2048   05/27/19 1630  cefTRIAXone (ROCEPHIN) 1 g in sodium chloride 0.9 % 100 mL IVPB     1 g 200 mL/hr over 30 Minutes Intravenous  Once 05/27/19 1628 05/27/19 1849   05/27/19 1630  metroNIDAZOLE (FLAGYL) IVPB 500 mg     500 mg 100 mL/hr over 60 Minutes Intravenous  Once 05/27/19 1628 05/27/19 2111      Assessment/Plan: Appendicitis with possible perforation, and right proximal humeral fracture.  -WBC increased from 11.5 to 12.1, surgery should be considered since she has failed to improve with abx.  If she is managed nonoperatively she is also at risk of recurrence especially with appendicoliths. FEN: IV fluids, clear liquids and ice chips ID: Zosyn 9/10>> day 4 DVT: Lovenox Follow up: TBD  LOS: 3 days    Renato Gails Estelita Iten 05/30/2019

## 2019-05-30 NOTE — Progress Notes (Signed)
Pharmacy Antibiotic Note  Mary Guzman is a 85 y.o. female admitted on 05/27/2019 with Intra-abdominal infection. Pharmacy has been consulted for Zosyn dosing. CT abdomen pelvis concerning for acute appendicitis w/ possible perforation. Afebrile, WBC 12.5 today. Noted renal dysfunction - CKD.  Patient Received ceftriaxone 1 g IV once and Flagyl 500 mg IV once in the ED. Surgery team planning repeat CT to look for abscess to drain, possible surgery for this patient.   Plan: Continue Zosyn 2.25 g IV Q8H Monitor renal function Monitor C/s  F/u plans for surgery   Height: 5\' 4"  (162.6 cm) Weight: 127 lb 13.9 oz (58 kg) IBW/kg (Calculated) : 54.7  Temp (24hrs), Avg:98.1 F (36.7 C), Min:97.9 F (36.6 C), Max:98.4 F (36.9 C)  Recent Labs  Lab 05/27/19 1246 05/27/19 1922 05/28/19 0255 05/29/19 0342 05/30/19 0307  WBC 11.7* 13.5* 12.6* 11.5* 12.1*  CREATININE 2.91* 3.17* 3.09* 3.35* 3.33*    Estimated Creatinine Clearance: 8.5 mL/min (A) (by C-G formula based on SCr of 3.33 mg/dL (H)).    No Known Allergies  Antimicrobials this admission: Ceftriaxone 9/10 >> Flagyl 9/10 >> Zosyn 9/10 >>  Microbiology: 9/10 COVID-19: Negative     Thank you for the interesting consult and for involving pharmacy in this patient's care.  Tamela Gammon, PharmD 05/30/2019 10:24 AM PGY-2 Pharmacy Administration Resident Direct Phone: [phone] Please check [email or web address] for unit-specific pharmacist phone numbers

## 2019-05-30 NOTE — Plan of Care (Signed)
  Problem: Health Behavior/Discharge Planning: Goal: Ability to manage health-related needs will improve Outcome: Progressing   

## 2019-05-31 LAB — CBC
HCT: 26.4 % — ABNORMAL LOW (ref 36.0–46.0)
Hemoglobin: 8.3 g/dL — ABNORMAL LOW (ref 12.0–15.0)
MCH: 27.8 pg (ref 26.0–34.0)
MCHC: 31.4 g/dL (ref 30.0–36.0)
MCV: 88.3 fL (ref 80.0–100.0)
Platelets: 259 10*3/uL (ref 150–400)
RBC: 2.99 MIL/uL — ABNORMAL LOW (ref 3.87–5.11)
RDW: 16.6 % — ABNORMAL HIGH (ref 11.5–15.5)
WBC: 12.2 10*3/uL — ABNORMAL HIGH (ref 4.0–10.5)
nRBC: 0 % (ref 0.0–0.2)

## 2019-05-31 LAB — IRON AND TIBC
Iron: 18 ug/dL — ABNORMAL LOW (ref 28–170)
Saturation Ratios: 11 % (ref 10.4–31.8)
TIBC: 161 ug/dL — ABNORMAL LOW (ref 250–450)
UIBC: 143 ug/dL

## 2019-05-31 LAB — BASIC METABOLIC PANEL
Anion gap: 10 (ref 5–15)
BUN: 64 mg/dL — ABNORMAL HIGH (ref 8–23)
CO2: 19 mmol/L — ABNORMAL LOW (ref 22–32)
Calcium: 7.6 mg/dL — ABNORMAL LOW (ref 8.9–10.3)
Chloride: 107 mmol/L (ref 98–111)
Creatinine, Ser: 3.16 mg/dL — ABNORMAL HIGH (ref 0.44–1.00)
GFR calc Af Amer: 14 mL/min — ABNORMAL LOW (ref >60)
GFR calc non Af Amer: 12 mL/min — ABNORMAL LOW (ref >60)
Glucose, Bld: 114 mg/dL — ABNORMAL HIGH (ref 70–99)
Potassium: 3.6 mmol/L (ref 3.5–5.1)
Sodium: 136 mmol/L (ref 135–145)

## 2019-05-31 LAB — FOLATE: Folate: 15.1 ng/mL (ref >5.9)

## 2019-05-31 LAB — RETICULOCYTES
Immature Retic Fract: 19.8 % — ABNORMAL HIGH (ref 2.3–15.9)
RBC.: 3.33 MIL/uL — ABNORMAL LOW (ref 3.87–5.11)
Retic Count, Absolute: 41.3 10*3/uL (ref 19.0–186.0)
Retic Ct Pct: 1.2 % (ref 0.4–3.1)

## 2019-05-31 LAB — FERRITIN: Ferritin: 243 ng/mL (ref 11–307)

## 2019-05-31 LAB — VITAMIN B12: Vitamin B-12: 1109 pg/mL — ABNORMAL HIGH (ref 180–914)

## 2019-05-31 MED ORDER — PNEUMOCOCCAL VAC POLYVALENT 25 MCG/0.5ML IJ INJ
0.5000 mL | INJECTION | INTRAMUSCULAR | Status: AC
  Administered 2019-06-01: 0.5 mL via INTRAMUSCULAR
  Filled 2019-05-31: qty 0.5

## 2019-05-31 MED ORDER — INFLUENZA VAC A&B SA ADJ QUAD 0.5 ML IM PRSY
0.5000 mL | PREFILLED_SYRINGE | INTRAMUSCULAR | Status: AC
  Administered 2019-06-01: 0.5 mL via INTRAMUSCULAR
  Filled 2019-05-31: qty 0.5

## 2019-05-31 NOTE — Progress Notes (Addendum)
CC: Syncope and abdominal pain  Subjective: Her pain is much improved this AM.  She says she feels better.    Objective: Vital signs in last 24 hours: Temp:  [98 F (36.7 C)-98.9 F (37.2 C)] 98.3 F (36.8 C) (09/14 0430) Pulse Rate:  [64-73] 64 (09/14 0430) Resp:  [18-19] 18 (09/14 0430) BP: (105-125)/(55-62) 119/61 (09/14 0430) SpO2:  [93 %-96 %] 96 % (09/14 0430) Weight:  [57.3 kg] 57.3 kg (09/14 0457) Last BM Date: 05/26/19 55 p.o. 1550 IV Urine 100 recorded Nothing else recorded in I&O Afebrile vital signs are stable Creatinine 3.16 WBC stable at 12.2, it was 12.1 yesterday Intake/Output from previous day: 09/13 0701 - 09/14 0700 In: 1605 [P.O.:55; I.V.:1400; IV Piggyback:150] Out: 100 [Urine:100] Intake/Output this shift: No intake/output data recorded.  General appearance: alert, cooperative and no distress Resp: clear anterior, hard to turn her with fx right arm. GI: Her abdomen soft.  She had to feel around to find where the pain was and initially she identified the left side.  She still little bit tender in the right lower quadrant, but significantly better than she was 2 days ago.  Positive bowel sounds no BM recorded.  Lab Results:  Recent Labs    05/30/19 0307 05/31/19 0416  WBC 12.1* 12.2*  HGB 8.3* 8.3*  HCT 26.4* 26.4*  PLT 247 259    BMET Recent Labs    05/30/19 0307 05/31/19 0416  NA 137 136  K 3.7 3.6  CL 106 107  CO2 20* 19*  GLUCOSE 125* 114*  BUN 70* 64*  CREATININE 3.33* 3.16*  CALCIUM 7.8* 7.6*   PT/INR No results for input(s): LABPROT, INR in the last 72 hours.  Recent Labs  Lab 05/27/19 1246  AST 38  ALT 25  ALKPHOS 228*  BILITOT 0.4  PROT 6.1*  ALBUMIN 2.7*     Lipase  No results found for: LIPASE   Medications: . enoxaparin (LOVENOX) injection  30 mg Subcutaneous Q24H  . levothyroxine  37.5 mcg Intravenous Daily  . mouth rinse  15 mL Mouth Rinse BID  . pantoprazole (PROTONIX) IV  40 mg Intravenous QHS    . dextrose 5% lactated ringers 75 mL/hr at 05/31/19 0326  . piperacillin-tazobactam (ZOSYN)  IV 2.25 g (05/31/19 0115)    Assessment/Plan Syncope AKI with CKD stage III   - Creatinine 3.91 >> 2.20(2/54)>>2.70(6/23)>>7.62(8/31) Chronic systolic heart failure/mild troponin elevation Hypertension Hypothyroid Hx breast cancer  Fall with right proximal humeral fracture  To see Dr. 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  FEN:IV fluids/NPO ID:  Zosyn 9/10 >> day 5 DVT:  Lovenox Follow up:  TBD  Plan: Dr. Lucia Gaskins is going to see her but I think we could try her on some clears, continue IV antibiotics, get asked the nursing staff to get her out of bed more.  She does not have an incentive spirometer and we need to work on that for her atelectasis.  Her IV fluids are at 75 cc/h.  Creatinine is stabilizing.  Defer fluid management to Dr. Karleen Hampshire.   LOS: 4 days    JENNINGS,WILLARD 05/31/2019 [phone]  Agree with above. Okay to start liquids.  CT scan - 9/13 - Ill-defined appendix containing a cluster of small stones versus larger stone measuring 13 x 7 mm. The tiny linear focus of extraluminal gas identified on the previous study is stable. No evidence for organized fluid collection on the current study to suggest abscess. No additional free gas or free fluid  identified.   Basically unchanged.  I talked to her daughter, Megean Fabio. She is looking at a facility on discharge.  Alphonsa Overall, MD, [facility] Surgery Pager: [phone] Office phone:  [phone]

## 2019-05-31 NOTE — Progress Notes (Signed)
Physical Therapy Treatment Patient Details Name: Mary Guzman MRN: [id number] DOB: 01-13-1921 Today's Date: 05/31/2019    History of Present Illness Pt is a 85 y.o. F with significant PMH of breast CA, hypertension, osteoporosis. Pt seen and discharged from [facility] ED 9/9 following a fall and subsequent R humerus fracture. Now presents to [facility] ED following syncopal episode. CT head negative for acute abnormality.    PT Comments    Pt very pleasant and receptive to participation in therapy. She is eager to start moving more. She required max assist supine to sit, mod assist sit to stand, and mod assist ambulation 5 feet HHA on the L. Pt moaning in pain during transition from sup to sit. When asked what hurts she replied "everything, all over." Pt's R distal LE also tender to the touch, primarily at the ankle. Edema noted as well as scar from previous ankle sx. Pt reports that sx was a long time ago. She performed LE exercises in recliner. Pt with notable fatigue. Positioned in recliner with feet elevated. She was very grateful for PT intervention.    Follow Up Recommendations  SNF;Supervision/Assistance - 24 hour     Equipment Recommendations  3in1 (PT);Wheelchair (measurements PT);Wheelchair cushion (measurements PT)    Recommendations for Other Services       Precautions / Restrictions Precautions Precautions: Fall Required Braces or Orthoses: Sling(RUE) Restrictions RUE Weight Bearing: Non weight bearing    Mobility  Bed Mobility Overal bed mobility: Needs Assistance Bed Mobility: Supine to Sit     Supine to sit: Max assist;HOB elevated     General bed mobility comments: assist with BLE out of bed and to elevate trunk, use of bed pad to scoot to EOB  Transfers Overall transfer level: Needs assistance Equipment used: 1 person hand held assist Transfers: Sit to/from Stand Sit to Stand: Mod assist         General transfer comment: assist to power up and stabilize initial  standing balance  Ambulation/Gait Ambulation/Gait assistance: Mod assist Gait Distance (Feet): 5 Feet Assistive device: 1 person hand held assist Gait Pattern/deviations: Step-through pattern;Decreased stride length;Trunk flexed Gait velocity: decreased Gait velocity interpretation: <1.8 ft/sec, indicate of risk for recurrent falls General Gait Details: slow, guarded gait; unsteady, cues for sequencing, HHA on L   Stairs             Wheelchair Mobility    Modified Rankin (Stroke Patients Only)       Balance Overall balance assessment: Needs assistance Sitting-balance support: Feet supported;No upper extremity supported Sitting balance-Leahy Scale: Fair Sitting balance - Comments: min guard assist sitting EOB   Standing balance support: During functional activity;Single extremity supported Standing balance-Leahy Scale: Poor Standing balance comment: reliant on external support                            Cognition Arousal/Alertness: Awake/alert Behavior During Therapy: WFL for tasks assessed/performed Overall Cognitive Status: Within Functional Limits for tasks assessed                                        Exercises General Exercises - Lower Extremity Long Arc Quad: AROM;Right;Left;10 reps;Seated Heel Slides: AROM;Right;Left;10 reps;Supine Hip Flexion/Marching: AROM;Right;Left;10 reps;Seated Other Exercises Other Exercises: Pt encouraged to open/close R hand for edema control.    General Comments General comments (skin integrity, edema, etc.): hgb 8.3  Pertinent Vitals/Pain Pain Assessment: Faces Faces Pain Scale: Hurts even more Pain Location: multi sites/generalized (When asked 'what hurts,' pt states 'everything.' Pain Descriptors / Indicators: Moaning;Guarding;Grimacing;Tender;Sore Pain Intervention(s): Limited activity within patient's tolerance;Repositioned    Home Living                      Prior  Function            PT Goals (current goals can now be found in the care plan section) Acute Rehab PT Goals Patient Stated Goal: to walk PT Goal Formulation: With patient/family Time For Goal Achievement: 06/10/19 Potential to Achieve Goals: Fair Progress towards PT goals: Progressing toward goals    Frequency    Min 3X/week      PT Plan Current plan remains appropriate    Co-evaluation              AM-PAC PT "6 Clicks" Mobility   Outcome Measure  Help needed turning from your back to your side while in a flat bed without using bedrails?: A Little Help needed moving from lying on your back to sitting on the side of a flat bed without using bedrails?: A Lot Help needed moving to and from a bed to a chair (including a wheelchair)?: A Lot Help needed standing up from a chair using your arms (e.g., wheelchair or bedside chair)?: A Lot Help needed to walk in hospital room?: A Lot Help needed climbing 3-5 steps with a railing? : A Lot 6 Click Score: 13    End of Session Equipment Utilized During Treatment: Gait belt;Other (comment)(sling) Activity Tolerance: Patient tolerated treatment well Patient left: in chair;with call bell/phone within reach;with chair alarm set Nurse Communication: Mobility status PT Visit Diagnosis: Unsteadiness on feet (R26.81);Pain;Difficulty in walking, not elsewhere classified (R26.2) Pain - Right/Left: Right Pain - part of body: Shoulder     Time: 8676-1950 PT Time Calculation (min) (ACUTE ONLY): 17 min  Charges:  $Therapeutic Activity: 8-22 mins                     Lorrin Goodell, PT  Office # [phone] Pager [phone]    Lorriane Shire 05/31/2019, 9:54 AM

## 2019-05-31 NOTE — Progress Notes (Signed)
PROGRESS NOTE    Mary Guzman  YYT:[id number] DOB: 1921/03/31 DOA: 05/27/2019 PCP: Mary Peng, NP    Brief Narrative:   85 year old lady with prior history of hypertension hypothyroidism presented to ED on 9/10/20204 lower abdominal pain and right arm pain.  CT of the abdomen and pelvis showed. Acute appendicitis with multiple appendicoliths and a small focal, contained perforation without abscess.  24 hours prior to the arrival to ED on 9/10, pt had a syncopal episode and fell on her right arm.  X-rays of the right arm shows Acute impacted and slightly angulated fracture involving the right humeral neck. Orthopedics consulted and recommended NWB in sling and follow up with Dr Mary Guzman in the office in 2 to 3 weeks.   Surgery consulted for the acute appendicitis and recommended non operative treatment with IV antibiotics and if she does not improve she might need surgery.   Pt seen and examined today, she reports her pain is better denies any nausea or vomiting.  Discussed the results of the CT abdomen and pelvis with the patient.  No family at bedside.   Assessment & Plan:   Principal Problem:   Appendicitis Active Problems:   Hypothyroidism   Essential hypertension   GERD   Osteoarthritis   Chronic systolic CHF (congestive heart failure) (HCC)   CKD (chronic kidney disease), stage III (HCC)   Right humeral fracture   Acute appendicitis with contained perforation Patient reports her abdominal pain has improved, but not completely resolved.  She denies having any nausea or vomiting.She remains afebrile but leukocytosis is persistent.  Surgery consulted and recommended to watch on IV antibiotics .  Repeat CT abdomen and pelvis done on 05/30/2019 does not show any abscess formation. Further management and recommendations per general surgery. Surgery recommends to start clear liquid diet and we will stop the IV fluids once she is able to tolerate liquids.    Right humeral  fracture Secondary to a fall.  Right shoulder sling was placed and nonweightbearing bearing recommended by orthopedics.  Outpatient follow-up with Dr. Alma Guzman on discharge. Continue with pain control for now and nonweightbearing on the right side. Physical therapy evaluation recommending SNF.  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   Syncope Probably secondary to orthostatic versus vasovagal from acute appendicitis. Continue to monitor and will probably get a PT/OT evaluation after her abdominal issues have improved. PT evaluation recommending SNF.    Acute on stage III CKD Baseline creatinine around 2.5 and creatinine has worsened to 3.3.  Probably secondary to ATN/dehydration.  Creatinine improving today with IV fluids from 3.3-3.1 continue to hold nephrotoxins/Lasix.  Continue with gentle hydration and repeat renal parameters in the morning.     Chronic diastolic heart failure No signs of fluid overload.  Last echocardiogram from 2018 showedWall thickness was   increased in a pattern of moderate LVH. The estimated ejection  fraction was 55%. Wall motion was normal; there were no regional   wall motion abnormalities. Septal-lateral dyssynchrony. Doppler  parameters are consistent with abnormal left ventricular   relaxation (grade 1 diastolic dysfunction). Lasix is on hold for AKI. Once she is able to tolerate liquid diet we will slowly restart her diuretics.   Hypothyroidism  continue with Synthroid.    Anemia of chronic disease/anemia of acute illness. Baseline hemoglobin between 9-10 currently her hemoglobin is at 8.3 and stable Anemia panel will be sent.  Transfuse to keep hemoglobin greater than 7.   Elevated troponins probably from demand ischemia from acute appendicitis.  GERD stable    Hypertension Well-controlled blood pressure measurements.   DVT prophylaxis: Lovenox Code Status: DNR Family Communication: Daughter at bedside  Disposition Plan: Pending further evaluation by  surgery   Consultants:   General surgery.   Procedures: CT abd and pelvis.   Antimicrobials: zosyn since admission.   Subjective: Pt seen and examined today, she reports her pain is better denies any nausea or vomiting.  Discussed the results of the CT abdomen and pelvis with the patient.  No family at bedside.    Objective: Vitals:   05/30/19 1453 05/30/19 2043 05/31/19 0430 05/31/19 0457  BP: 125/62 (!) 105/55 119/61   Pulse: 73 73 64   Resp: 19 18 18    Temp: 98 F (36.7 C) 98.9 F (37.2 C) 98.3 F (36.8 C)   TempSrc: Oral Oral Oral   SpO2: 95% 93% 96%   Weight:    57.3 kg  Height:        Intake/Output Summary (Last 24 hours) at 05/31/2019 1047 Last data filed at 05/31/2019 1006 Gross per 24 hour  Intake 1605 ml  Output 400 ml  Net 1205 ml   Filed Weights   05/28/19 0500 05/29/19 0500 05/31/19 0457  Weight: 58 kg 58 kg 57.3 kg    Examination:  General exam: Alert and not in any kind of distress Respiratory system: Air entry fair bilateral no wheezing or rhonchi Cardiovascular system: S1-S2 heard, regular rate rhythm, no pedal edema Gastrointestinal system: Abdomen is soft mildly tender in the right lower quadrant no signs of peritonitis bowel sounds are good Central nervous system: Patient is alert and oriented to place and person and answering questions appropriately.   Extremities: No pedal edema or cyanosis Skin: No rashes seen Psychiatry: Mood is appropriate    Data Reviewed: I have personally reviewed following labs and imaging studies  CBC: Recent Labs  Lab 05/27/19 1246 05/27/19 1922 05/28/19 0255 05/29/19 0342 05/30/19 0307 05/31/19 0416  WBC 11.7* 13.5* 12.6* 11.5* 12.1* 12.2*  NEUTROABS 10.5*  --   --   --   --   --   HGB 9.9* 10.2* 9.1* 8.7* 8.3* 8.3*  HCT 32.1* 32.3* 28.7* 27.3* 26.4* 26.4*  MCV 89.4 89.2 88.0 87.5 87.1 88.3  PLT 237 270 243 241 247 161   Basic Metabolic Panel: Recent Labs  Lab 05/27/19 1246 05/27/19 1922  05/28/19 0255 05/29/19 0342 05/30/19 0307 05/31/19 0416  NA 138  --  138 142 137 136  K 3.8  --  3.9 3.4* 3.7 3.6  CL 105  --  106 110 106 107  CO2 20*  --  18* 20* 20* 19*  GLUCOSE 110*  --  125* 120* 125* 114*  BUN 63*  --  71* 71* 70* 64*  CREATININE 2.91* 3.17* 3.09* 3.35* 3.33* 3.16*  CALCIUM 8.3*  --  8.2* 8.1* 7.8* 7.6*   GFR: Estimated Creatinine Clearance: 9 mL/min (A) (by C-G formula based on SCr of 3.16 mg/dL (H)). Liver Function Tests: Recent Labs  Lab 05/27/19 1246  AST 38  ALT 25  ALKPHOS 228*  BILITOT 0.4  PROT 6.1*  ALBUMIN 2.7*   No results for input(s): LIPASE, AMYLASE in the last 168 hours. No results for input(s): AMMONIA in the last 168 hours. Coagulation Profile: No results for input(s): INR, PROTIME in the last 168 hours. Cardiac Enzymes: No results for input(s): CKTOTAL, CKMB, CKMBINDEX, TROPONINI in the last 168 hours. BNP (last 3 results) No results for input(s): PROBNP in  the last 8760 hours. HbA1C: No results for input(s): HGBA1C in the last 72 hours. CBG: No results for input(s): GLUCAP in the last 168 hours. Lipid Profile: No results for input(s): CHOL, HDL, LDLCALC, TRIG, CHOLHDL, LDLDIRECT in the last 72 hours. Thyroid Function Tests: No results for input(s): TSH, T4TOTAL, FREET4, T3FREE, THYROIDAB in the last 72 hours. Anemia Panel: No results for input(s): VITAMINB12, FOLATE, FERRITIN, TIBC, IRON, RETICCTPCT in the last 72 hours. Sepsis Labs: No results for input(s): PROCALCITON, LATICACIDVEN in the last 168 hours.  Recent Results (from the past 240 hour(s))  SARS Coronavirus 2 [facility] order, Performed in [facility] hospital lab) Nasopharyngeal Nasopharyngeal Swab     Status: None   Collection Time: 05/27/19  6:26 PM   Specimen: Nasopharyngeal Swab  Result Value Ref Range Status   SARS Coronavirus 2 NEGATIVE NEGATIVE Final    Comment: (NOTE) If result is NEGATIVE SARS-CoV-2 target nucleic acids are NOT DETECTED. The  SARS-CoV-2 RNA is generally detectable in upper and lower  respiratory specimens during the acute phase of infection. The lowest  concentration of SARS-CoV-2 viral copies this assay can detect is 250  copies / mL. A negative result does not preclude SARS-CoV-2 infection  and should not be used as the sole basis for treatment or other  patient management decisions.  A negative result may occur with  improper specimen collection / handling, submission of specimen other  than nasopharyngeal swab, presence of viral mutation(s) within the  areas targeted by this assay, and inadequate number of viral copies  (<250 copies / mL). A negative result must be combined with clinical  observations, patient history, and epidemiological information. If result is POSITIVE SARS-CoV-2 target nucleic acids are DETECTED. The SARS-CoV-2 RNA is generally detectable in upper and lower  respiratory specimens dur ing the acute phase of infection.  Positive  results are indicative of active infection with SARS-CoV-2.  Clinical  correlation with patient history and other diagnostic information is  necessary to determine patient infection status.  Positive results do  not rule out bacterial infection or co-infection with other viruses. If result is PRESUMPTIVE POSTIVE SARS-CoV-2 nucleic acids MAY BE PRESENT.   A presumptive positive result was obtained on the submitted specimen  and confirmed on repeat testing.  While 2019 novel coronavirus  (SARS-CoV-2) nucleic acids may be present in the submitted sample  additional confirmatory testing may be necessary for epidemiological  and / or clinical management purposes  to differentiate between  SARS-CoV-2 and other Sarbecovirus currently known to infect humans.  If clinically indicated additional testing with an alternate test  methodology [phone]) is advised. The SARS-CoV-2 RNA is generally  detectable in upper and lower respiratory sp ecimens during the acute   phase of infection. The expected result is Negative. Fact Sheet for Patients:  StrictlyIdeas.no Fact Sheet for Healthcare Providers: BankingDealers.co.za This test is not yet approved or cleared by the [place] FDA and has been authorized for detection and/or diagnosis of SARS-CoV-2 by FDA under an Emergency Use Authorization (EUA).  This EUA will remain in effect (meaning this test can be used) for the duration of the COVID-19 declaration under Section 564(b)(1) of the Act, 21 U.S.C. section 360bbb-3(b)(1), unless the authorization is terminated or revoked sooner. Performed at [facility], [place] [address], [place], [place] [address]          Radiology Studies: Ct Abdomen Pelvis Wo Contrast  Result Date: 05/30/2019 CLINICAL DATA:  Generalized abdominal pain since yesterday. Fever.  Non operative management for acute appendicitis. EXAM: CT ABDOMEN AND PELVIS WITHOUT CONTRAST TECHNIQUE: Multidetector CT imaging of the abdomen and pelvis was performed following the standard protocol without IV contrast. COMPARISON:  05/27/2019 FINDINGS: Lower chest: Interval development of small bilateral pleural effusions with some dependent atelectasis in the lower lobes bilaterally. Hepatobiliary: No focal abnormality in the liver on this study without intravenous contrast. Gallbladder is markedly distended. Mild extrahepatic biliary duct prominence is similar with 8 mm diameter common bile duct in the head of pancreas. Pancreas: No focal mass lesion. No dilatation of the main duct. No intraparenchymal cyst. No peripancreatic edema. Spleen: No splenomegaly. No focal mass lesion. Adrenals/Urinary Tract: No adrenal nodule or mass. Bilateral renal lesions are evident, some compatible with cysts and others with attenuation too high to be simple cysts. No hydroureteronephrosis. The urinary bladder appears normal for the degree of distention. Some exophytic  homogeneous Stomach/Bowel: Stomach is unremarkable. No gastric wall thickening. No evidence of outlet obstruction. Duodenum is normally positioned as is the ligament of Treitz. No small bowel wall thickening. No small bowel dilatation. The terminal ileum is normal. Similar appearance of the appendix with 13 x 7 mm appendicoliths visible on 61/3. Small linear focus of apparent extraluminal gas adjacent to the appendix on 59/3 is stable. No new focal fluid collection in the right lower quadrant. Vascular/Lymphatic: There is abdominal aortic atherosclerosis without aneurysm. There is no gastrohepatic or hepatoduodenal ligament lymphadenopathy. No intraperitoneal or retroperitoneal lymphadenopathy. No pelvic sidewall lymphadenopathy. Reproductive: The uterus is unremarkable.  There is no adnexal mass. Other: Trace free fluid noted in the cul-de-sac. Body wall edema noted lower abdomen and pelvis. Musculoskeletal: Left lower quadrant groin hernia dissects cranially between the internal and external oblique fascia. This hernia contains trace fluid but no bowel. Chronic posttraumatic deformity noted left pubic bone. Bones are diffusely demineralized. No worrisome lytic or sclerotic osseous abnormality. IMPRESSION: 1. No substantial interval change in abdomen/pelvis exam. 2. Ill-defined appendix containing a cluster of small stones versus larger stone measuring 13 x 7 mm. The tiny linear focus of extraluminal gas identified on the previous study is stable. No evidence for organized fluid collection on the current study to suggest abscess. No additional free gas or free fluid identified. 3. Interval development of small bilateral pleural effusions with dependent bilateral lower lobe atelectasis. 4. Bilateral renal lesions, some of which are compatible with cysts and others which have attenuation too high to be simple cysts. These may well represent cyst complicated by proteinaceous debris or hemorrhage, but neoplasm cannot  be excluded. Follow-up could be used to ensure stability as clinically warranted. 5.  Aortic Atherosclerois (ICD10-170.0) Electronically Signed   By: Misty Stanley M.D.   On: 05/30/2019 15:29        Scheduled Meds:  enoxaparin (LOVENOX) injection  30 mg Subcutaneous Q24H   [START ON 06/01/2019] influenza vaccine adjuvanted  0.5 mL Intramuscular Tomorrow-1000   levothyroxine  37.5 mcg Intravenous Daily   mouth rinse  15 mL Mouth Rinse BID   pantoprazole (PROTONIX) IV  40 mg Intravenous QHS   [START ON 06/01/2019] pneumococcal 23 valent vaccine  0.5 mL Intramuscular Tomorrow-1000   Continuous Infusions:  dextrose 5% lactated ringers 75 mL/hr at 05/31/19 0326   piperacillin-tazobactam (ZOSYN)  IV 2.25 g (05/31/19 0838)     LOS: 4 days    Time spent: 32 minutes.     Hosie Poisson, MD [facility] Pager [phone]  If 7PM-7AM, please contact night-coverage [email or web address] Password [facility] 05/31/2019, 10:47  AM  ° °

## 2019-05-31 NOTE — TOC Progression Note (Signed)
Transition of Care [facility]) - Progression Note    Patient Details  Name: Mary Guzman MRN: [id number] Date of Birth: January 04, 1921  Transition of Care [facility]) CM/SW [place], [place] Phone Number: 05/31/2019, 4:17 PM  Clinical Narrative:    CSW met with pt and pt daughter at bedside. Pt daughter provided with offers for SNF. Pt daughter new to SNF process, CSW provided guidance on referral process, insurance authorization, and visitation guidelines with COVID. Encouraged daughter to use [employer], Medicare.gov website and [place] website with any further questions. Encouraged daughter to pick at least a top 2-3 and CSW will follow up with her in the morning following morning rounds.     Expected Discharge Plan: Skilled Nursing Facility Barriers to Discharge: [place] (PASRR), Insurance Authorization, Continued Medical Work up  Expected Discharge Plan and Services Expected Discharge Plan: [place] In-house Referral: Clinical Social Work Discharge Planning Services: CM Consult Living arrangements for the past 2 months: [place]   Social Determinants of Health (SDOH) Interventions    Readmission Risk Interventions No flowsheet data found.

## 2019-05-31 NOTE — Care Management Important Message (Signed)
Important Message  Patient Details  Name: [name] MRN: [id number] Date of Birth: [birth year]   Medicare Important Message Given:  Yes     Memory Argue 05/31/2019, 1:29 PM    SIGNED

## 2019-06-01 LAB — BASIC METABOLIC PANEL
Anion gap: 10 (ref 5–15)
BUN: 60 mg/dL — ABNORMAL HIGH (ref 8–23)
CO2: 21 mmol/L — ABNORMAL LOW (ref 22–32)
Calcium: 7.8 mg/dL — ABNORMAL LOW (ref 8.9–10.3)
Chloride: 108 mmol/L (ref 98–111)
Creatinine, Ser: 3.04 mg/dL — ABNORMAL HIGH (ref 0.44–1.00)
GFR calc Af Amer: 14 mL/min — ABNORMAL LOW (ref >60)
GFR calc non Af Amer: 12 mL/min — ABNORMAL LOW (ref >60)
Glucose, Bld: 122 mg/dL — ABNORMAL HIGH (ref 70–99)
Potassium: 4 mmol/L (ref 3.5–5.1)
Sodium: 139 mmol/L (ref 135–145)

## 2019-06-01 LAB — CBC
HCT: 27.8 % — ABNORMAL LOW (ref 36.0–46.0)
Hemoglobin: 9 g/dL — ABNORMAL LOW (ref 12.0–15.0)
MCH: 28.7 pg (ref 26.0–34.0)
MCHC: 32.4 g/dL (ref 30.0–36.0)
MCV: 88.5 fL (ref 80.0–100.0)
Platelets: 264 10*3/uL (ref 150–400)
RBC: 3.14 MIL/uL — ABNORMAL LOW (ref 3.87–5.11)
RDW: 16.9 % — ABNORMAL HIGH (ref 11.5–15.5)
WBC: 13.8 10*3/uL — ABNORMAL HIGH (ref 4.0–10.5)
nRBC: 0 % (ref 0.0–0.2)

## 2019-06-01 MED ORDER — DEXTROSE IN LACTATED RINGERS 5 % IV SOLN: INTRAVENOUS | Status: DC

## 2019-06-01 NOTE — TOC Progression Note (Signed)
Transition of Care [facility]) - Progression Note    Patient Details  Name: Mary Guzman MRN: [id number] Date of Birth: 1920/11/22  Transition of Care [facility]) CM/SW [place], [place] Phone Number: 06/01/2019, 12:33 PM  Clinical Narrative:    Had an additional conversation with pt daughter and pt regarding SNF- they continue to prefer [facility], with [facility] as second choice. At this time [facility] is following as well for medical stability. Will likely initiate insurance approval once there is more clarity regarding whether or not pt will need surgical intervention.    Expected Discharge Plan: Skilled Nursing Facility Barriers to Discharge: [place] (PASRR), Insurance Authorization, Continued Medical Work up  Expected Discharge Plan and Services Expected Discharge Plan: [place] In-house Referral: Clinical Social Work Discharge Planning Services: CM Consult Living arrangements: Single Family Home    Social Determinants of Health (SDOH) Interventions    Readmission Risk Interventions No flowsheet data found.

## 2019-06-01 NOTE — Telephone Encounter (Signed)
Patient's daughter returning call to [facility]. Requesting a call back.

## 2019-06-01 NOTE — Progress Notes (Signed)
Dr. Karleen Hampshire notified BP 92/39, HR 50. Asymptomatic. Recent Morphine admin. Will resume IVF per order and continue to monitor.

## 2019-06-01 NOTE — Progress Notes (Signed)
Occupational Therapy Treatment Patient Details Name: Mary Guzman MRN: [id number] DOB: [birth year] Today's Date: 06/01/2019    History of present illness Pt is a 85 y.o. F with significant PMH of breast CA, hypertension, osteoporosis. Pt seen and discharged from [facility] ED 9/9 following a fall and subsequent R humerus fracture. Now presents to [facility] ED following syncopal episode. CT head negative for acute abnormality.   OT comments  Pt making steady progress towards OT goals this session. Session focus on functional transfers from recliner>BSC as pt with frequent reports of having BM. MOD A face-to-face transfer for sit>stand from recliner. Stand pivot transfer from recliner>BSC MOD A for hand placement and body mechanics during transfer. Total A for pericare after toileting.MAX A for LB dressing to don new brief. Pt able to pull brief up to waist on L side. DC plan to SNF remains appropriate. Will continue to follow for acute OT needs.    Follow Up Recommendations  SNF    Equipment Recommendations  Other (comment)(tbd to next venue)    Recommendations for Other Services      Precautions / Restrictions Precautions Precautions: Fall Required Braces or Orthoses: Sling Restrictions Weight Bearing Restrictions: Yes RUE Weight Bearing: Non weight bearing       Mobility Bed Mobility               General bed mobility comments: up in recliner  Transfers Overall transfer level: Needs assistance Equipment used: 1 person hand held assist(face-to-face worked better) Transfers: Sit to/from [employer] to Stand: Mod assist         General transfer comment: assist to power up and stabilize initial standing balance    Balance Overall balance assessment: Needs assistance Sitting-balance support: Feet supported;No upper extremity supported Sitting balance-Leahy Scale: Fair Sitting balance - Comments: min guard while sitting on BSC   Standing balance support: Bilateral upper extremity  supported Standing balance-Leahy Scale: Poor Standing balance comment: reliant on external support                           ADL either performed or assessed with clinical judgement   ADL Overall ADL's : Needs assistance/impaired                 Upper Body Dressing : Maximal assistance;Sitting Upper Body Dressing Details (indicate cue type and reason): donning hospital gown with sling Lower Body Dressing: MAX assistance;Sit to/from stand Lower Body Dressing Details (indicate cue type and reason): unable to reach down for feet in sitting Toilet Transfer: Moderate assistance;BSC;Stand-pivot Toilet Transfer Details (indicate cue type and reason): face-to-face transfer; MOD A to power up; responds better to face- to- face rather than hand held [place] and Hygiene: Total assistance;Sit to/from stand;+2 for physical assistance Toileting - Clothing Manipulation Details (indicate cue type and reason): total A +2 for pericare after BM     Functional mobility during ADLs: Moderate assistance General ADL Comments: session focus on funcitonal transfers from recliner>BSC for toileting. total A for pericare as pt with BM and unable to maintain balance and complete pericare     Vision Patient Visual Report: No change from baseline     Perception     Praxis      Cognition Arousal/Alertness: Awake/alert Behavior During Therapy: WFL for tasks assessed/performed Overall Cognitive Status: Within Functional Limits for tasks assessed  General Comments: pleasant and oriented to deficits        Exercises     Shoulder Instructions       General Comments      Pertinent Vitals/ Pain       Pain Assessment: Faces Faces Pain Scale: Hurts a little bit Pain Location: RUE; back Pain Descriptors / Indicators: Moaning;Guarding;Grimacing;Sore;Discomfort Pain Intervention(s): Limited activity within patient's  tolerance;Monitored during session;Repositioned  Home Living                                          Prior Functioning/Environment              Frequency  Min 2X/week        Progress Toward Goals  OT Goals(current goals can now be found in the care plan section)  Progress towards OT goals: Progressing toward goals  Acute Rehab OT Goals Time For Goal Achievement: 06/11/19  Plan      Co-evaluation                 AM-PAC OT "6 Clicks" Daily Activity     Outcome Measure   Help from another person eating meals?: A Little Help from another person taking care of personal grooming?: A Little Help from another person toileting, which includes using toliet, bedpan, or urinal?: A Lot Help from another person bathing (including washing, rinsing, drying)?: A Lot Help from another person to put on and taking off regular upper body clothing?: A Lot Help from another person to put on and taking off regular lower body clothing?: A Lot 6 Click Score: 14    End of Session Equipment Utilized During Treatment: Other (comment)(RUE sling)  OT Visit Diagnosis: Other abnormalities of gait and mobility (R26.89);Muscle weakness (generalized) (M62.81);History of falling (Z91.81);Pain   Activity Tolerance Patient tolerated treatment well   Patient Left in chair;with call bell/phone within reach   Nurse Communication Mobility status        Time: 3568-6168 OT Time Calculation (min): 48 min  Charges: OT General Charges $OT Visit: 1 Visit OT Treatments $Self Care/Home Management : 38-52 mins  [place] [place] [facility] [phone] [address] 06/01/2019, 3:05 PM

## 2019-06-01 NOTE — Plan of Care (Signed)

## 2019-06-01 NOTE — Progress Notes (Addendum)
PROGRESS NOTE    Mary Guzman  XFG:[id number] DOB: September 03, 1921 DOA: 05/27/2019 PCP: [name], NP    Brief Narrative:   85 year old lady with prior history of hypertension hypothyroidism presented to ED on 9/10/20204 lower abdominal pain and right arm pain.  CT of the abdomen and pelvis showed. Acute appendicitis with multiple appendicoliths and a small focal, contained perforation without abscess.  24 hours prior to the arrival to ED on 9/10, pt had a syncopal episode and fell on her right arm.  X-rays of the right arm shows Acute impacted and slightly angulated fracture involving the right humeral neck. Orthopedics consulted and recommended NWB in sling and follow up with Dr [name] in the office in 2 to 3 weeks.   Surgery consulted for the acute appendicitis and recommended non operative treatment with IV antibiotics and if she does not improve she might need surgery.  Since she has persistent leukocytosis she underwent a repeat  CT abdomen and pelvis showing Ill-defined appendix containing a cluster of small stones versus larger stone measuring 13 x 7 mm. The tiny linear focus of extraluminal gas identified on the previous study is stable. No evidence for organized fluid collection on the current study to suggest abscess.   Patient was seen and examined today.  She reports the right lower quadrant abdominal pain has improved no associated nausea or vomiting and she is able to tolerate liquid diet and would like her diet to be advanced if possible.  She is made n.p.o. after midnight due to persistent leukocytosis and for possible OR in case she needs it.   Assessment & Plan:   Principal Problem:   Appendicitis Active Problems:   Hypothyroidism   Essential hypertension   GERD   Osteoarthritis   Chronic systolic CHF (congestive heart failure) (HCC)   CKD (chronic kidney disease), stage III (HCC)   Right humeral fracture   Acute appendicitis with contained perforation with multiple  appendicoliths Patient is in good spirits and alert and answering questions appropriately and she reports her abdominal pain has improved in the right lower quadrant.  She denies having any nausea or vomiting and is able to tolerate a clear liquid diet without any issues..She remains afebrile but leukocytosis is persistent.  Surgery consulted and recommended to watch on IV antibiotics, continue with IV Zosyn since admission..  Repeat CT abdomen and pelvis done on 05/30/2019 does not show any abscess formation. Further management and recommendations per general surgery. She is empirically made n.p.o. after midnight for possible OR in the morning because of persistent leukocytosis.    Right humeral fracture Secondary to a fall.  Right shoulder sling was placed and nonweightbearing bearing recommended by orthopedics.  Outpatient follow-up with Dr. [name] on discharge. Continue with pain control for now and nonweightbearing on the right side. Physical therapy evaluation recommending SNF.  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   Syncope Probably secondary to orthostatic versus vasovagal from acute appendicitis. Continue to monitor and will probably get a PT/OT evaluation after her abdominal issues have improved. PT evaluation recommending SNF.    Acute on stage III CKD Baseline creatinine around 2.5 and creatinine has worsened to 3.3.  Probably secondary to ATN/dehydration.  Creatinine improving today with IV fluids from 3.3-3.04 continue to hold nephrotoxins/Lasix.  Continue with gentle hydration and repeat renal parameters in the morning.     Chronic diastolic heart failure No signs of fluid overload.   Last echocardiogram from 2018 showedWall thickness was increased in a pattern of moderate  LVH. The estimated ejection  fraction was 55%. Wall motion was normal; there were no regional   wall motion abnormalities. Septal-lateral dyssynchrony. Doppler  parameters are consistent with abnormal left ventricular  relaxation (grade 1 diastolic dysfunction). Once she is able to tolerate liquid diet we will slowly restart her diuretics.   Hypothyroidism  continue with Synthroid.    Anemia of chronic disease/anemia of acute illness. Baseline hemoglobin between 9-10 currently her hemoglobin is at 8.3 and stable Anemia panel shows low iron levels and adequate vitamin B12 and folate and ferritin levels.  Iron supplementation will be added on discharge. Transfuse to keep hemoglobin greater than 7.   Elevated troponins probably from demand ischemia from acute appendicitis.   GERD stable    Hypertension Blood pressure parameters are well controlled.   DVT prophylaxis: Lovenox Code Status: DNR Family Communication: Daughter at bedside  Disposition Plan: Pending further evaluation by surgery   Consultants:   General surgery.   Procedures: CT abd and pelvis.   Antimicrobials: zosyn since admission.   Subjective: Patient seen and examined today she reports her abdominal pain is improving day by day without any nausea or vomiting.  She is able to tolerate clear liquid diet and is looking forward to being discharged soon.  Her daughter is at bedside.   Objective: Vitals:   05/31/19 1401 05/31/19 2021 06/01/19 0519 06/01/19 0552  BP: 122/67 135/61 (!) 130/96   Pulse: (!) 58 64 65   Resp: 20 15 16    Temp: 97.8 F (36.6 C) (!) 97.5 F (36.4 C) 98.4 F (36.9 C)   TempSrc: Oral Oral Oral   SpO2: 98%  92%   Weight:    61.1 kg  Height:        Intake/Output Summary (Last 24 hours) at 06/01/2019 1245 Last data filed at 06/01/2019 7893 Gross per 24 hour  Intake 950 ml  Output 200 ml  Net 750 ml   Filed Weights   05/29/19 0500 05/31/19 0457 06/01/19 0552  Weight: 58 kg 57.3 kg 61.1 kg    Examination:  General exam: She is alert and comfortable not in any kind of distress Respiratory system: Diminished in bases, no wheezing or rhonchi Cardiovascular system: S1-S2 heard, regular  rate rhythm, trace pedal edema Gastrointestinal system: Abdomen is soft, no tenderness in the right quadrant lower quadrant, no signs of peritonitis, bowel sounds are good Central nervous system: Patient is alert and oriented to place, person and time and answering questions appropriately Extremities: Trace pedal edema, no cyanosis or clubbing Skin: No rash or lesions Psychiatry: Mood is appropriate    Data Reviewed: I have personally reviewed following labs and imaging studies  CBC: Recent Labs  Lab 05/27/19 1246  05/28/19 0255 05/29/19 0342 05/30/19 0307 05/31/19 0416 06/01/19 0100  WBC 11.7*   < > 12.6* 11.5* 12.1* 12.2* 13.8*  NEUTROABS 10.5*  --   --   --   --   --   --   HGB 9.9*   < > 9.1* 8.7* 8.3* 8.3* 9.0*  HCT 32.1*   < > 28.7* 27.3* 26.4* 26.4* 27.8*  MCV 89.4   < > 88.0 87.5 87.1 88.3 88.5  PLT 237   < > 243 241 247 259 264   < > = values in this interval not displayed.   Basic Metabolic Panel: Recent Labs  Lab 05/28/19 0255 05/29/19 0342 05/30/19 0307 05/31/19 0416 06/01/19 0100  NA 138 142 137 136 139  K 3.9 3.4* 3.7  3.6 4.0  CL 106 110 106 107 108  CO2 18* 20* 20* 19* 21*  GLUCOSE 125* 120* 125* 114* 122*  BUN 71* 71* 70* 64* 60*  CREATININE 3.09* 3.35* 3.33* 3.16* 3.04*  CALCIUM 8.2* 8.1* 7.8* 7.6* 7.8*   GFR: Estimated Creatinine Clearance: 9.3 mL/min (A) (by C-G formula based on SCr of 3.04 mg/dL (H)). Liver Function Tests: Recent Labs  Lab 05/27/19 1246  AST 38  ALT 25  ALKPHOS 228*  BILITOT 0.4  PROT 6.1*  ALBUMIN 2.7*   No results for input(s): LIPASE, AMYLASE in the last 168 hours. No results for input(s): AMMONIA in the last 168 hours. Coagulation Profile: No results for input(s): INR, PROTIME in the last 168 hours. Cardiac Enzymes: No results for input(s): CKTOTAL, CKMB, CKMBINDEX, TROPONINI in the last 168 hours. BNP (last 3 results) No results for input(s): PROBNP in the last 8760 hours. HbA1C: No results for input(s):  HGBA1C in the last 72 hours. CBG: No results for input(s): GLUCAP in the last 168 hours. Lipid Profile: No results for input(s): CHOL, HDL, LDLCALC, TRIG, CHOLHDL, LDLDIRECT in the last 72 hours. Thyroid Function Tests: No results for input(s): TSH, T4TOTAL, FREET4, T3FREE, THYROIDAB in the last 72 hours. Anemia Panel: Recent Labs    05/31/19 1247  VITAMINB12 1,109*  FOLATE 15.1  FERRITIN 243  TIBC 161*  IRON 18*  RETICCTPCT 1.2   Sepsis Labs: No results for input(s): PROCALCITON, LATICACIDVEN in the last 168 hours.  Recent Results (from the past 240 hour(s))  SARS Coronavirus 2 [facility] order, Performed in [facility] hospital lab) Nasopharyngeal Nasopharyngeal Swab     Status: None   Collection Time: 05/27/19  6:26 PM   Specimen: Nasopharyngeal Swab  Result Value Ref Range Status   SARS Coronavirus 2 NEGATIVE NEGATIVE Final    Comment: (NOTE) If result is NEGATIVE SARS-CoV-2 target nucleic acids are NOT DETECTED. The SARS-CoV-2 RNA is generally detectable in upper and lower  respiratory specimens during the acute phase of infection. The lowest  concentration of SARS-CoV-2 viral copies this assay can detect is 250  copies / mL. A negative result does not preclude SARS-CoV-2 infection  and should not be used as the sole basis for treatment or other  patient management decisions.  A negative result may occur with  improper specimen collection / handling, submission of specimen other  than nasopharyngeal swab, presence of viral mutation(s) within the  areas targeted by this assay, and inadequate number of viral copies  (<250 copies / mL). A negative result must be combined with clinical  observations, patient history, and epidemiological information. If result is POSITIVE SARS-CoV-2 target nucleic acids are DETECTED. The SARS-CoV-2 RNA is generally detectable in upper and lower  respiratory specimens dur ing the acute phase of infection.  Positive  results are indicative  of active infection with SARS-CoV-2.  Clinical  correlation with patient history and other diagnostic information is  necessary to determine patient infection status.  Positive results do  not rule out bacterial infection or co-infection with other viruses. If result is PRESUMPTIVE POSTIVE SARS-CoV-2 nucleic acids MAY BE PRESENT.   A presumptive positive result was obtained on the submitted specimen  and confirmed on repeat testing.  While 2019 novel coronavirus  (SARS-CoV-2) nucleic acids may be present in the submitted sample  additional confirmatory testing may be necessary for epidemiological  and / or clinical management purposes  to differentiate between  SARS-CoV-2 and other Sarbecovirus currently known to infect humans.  If clinically indicated additional testing with an alternate test  methodology [phone]) is advised. The SARS-CoV-2 RNA is generally  detectable in upper and lower respiratory sp ecimens during the acute  phase of infection. The expected result is Negative. Fact Sheet for Patients:  StrictlyIdeas.no Fact Sheet for Healthcare Providers: BankingDealers.co.za This test is not yet approved or cleared by the [place] FDA and has been authorized for detection and/or diagnosis of SARS-CoV-2 by FDA under an Emergency Use Authorization (EUA).  This EUA will remain in effect (meaning this test can be used) for the duration of the COVID-19 declaration under Section 564(b)(1) of the Act, 21 U.S.C. section 360bbb-3(b)(1), unless the authorization is terminated or revoked sooner. Performed at [facility], [place] [address], [place], [place] [address]          Radiology Studies: Ct Abdomen Pelvis Wo Contrast  Result Date: 05/30/2019 CLINICAL DATA:  Generalized abdominal pain since yesterday. Fever. Non operative management for acute appendicitis. EXAM: CT ABDOMEN AND PELVIS WITHOUT CONTRAST TECHNIQUE:  Multidetector CT imaging of the abdomen and pelvis was performed following the standard protocol without IV contrast. COMPARISON:  05/27/2019 FINDINGS: Lower chest: Interval development of small bilateral pleural effusions with some dependent atelectasis in the lower lobes bilaterally. Hepatobiliary: No focal abnormality in the liver on this study without intravenous contrast. Gallbladder is markedly distended. Mild extrahepatic biliary duct prominence is similar with 8 mm diameter common bile duct in the head of pancreas. Pancreas: No focal mass lesion. No dilatation of the main duct. No intraparenchymal cyst. No peripancreatic edema. Spleen: No splenomegaly. No focal mass lesion. Adrenals/Urinary Tract: No adrenal nodule or mass. Bilateral renal lesions are evident, some compatible with cysts and others with attenuation too high to be simple cysts. No hydroureteronephrosis. The urinary bladder appears normal for the degree of distention. Some exophytic homogeneous Stomach/Bowel: Stomach is unremarkable. No gastric wall thickening. No evidence of outlet obstruction. Duodenum is normally positioned as is the ligament of Treitz. No small bowel wall thickening. No small bowel dilatation. The terminal ileum is normal. Similar appearance of the appendix with 13 x 7 mm appendicoliths visible on 61/3. Small linear focus of apparent extraluminal gas adjacent to the appendix on 59/3 is stable. No new focal fluid collection in the right lower quadrant. Vascular/Lymphatic: There is abdominal aortic atherosclerosis without aneurysm. There is no gastrohepatic or hepatoduodenal ligament lymphadenopathy. No intraperitoneal or retroperitoneal lymphadenopathy. No pelvic sidewall lymphadenopathy. Reproductive: The uterus is unremarkable.  There is no adnexal mass. Other: Trace free fluid noted in the cul-de-sac. Body wall edema noted lower abdomen and pelvis. Musculoskeletal: Left lower quadrant groin hernia dissects cranially  between the internal and external oblique fascia. This hernia contains trace fluid but no bowel. Chronic posttraumatic deformity noted left pubic bone. Bones are diffusely demineralized. No worrisome lytic or sclerotic osseous abnormality. IMPRESSION: 1. No substantial interval change in abdomen/pelvis exam. 2. Ill-defined appendix containing a cluster of small stones versus larger stone measuring 13 x 7 mm. The tiny linear focus of extraluminal gas identified on the previous study is stable. No evidence for organized fluid collection on the current study to suggest abscess. No additional free gas or free fluid identified. 3. Interval development of small bilateral pleural effusions with dependent bilateral lower lobe atelectasis. 4. Bilateral renal lesions, some of which are compatible with cysts and others which have attenuation too high to be simple cysts. These may well represent cyst complicated by proteinaceous debris or hemorrhage, but neoplasm cannot be  excluded. Follow-up could be used to ensure stability as clinically warranted. 5.  Aortic Atherosclerois (ICD10-170.0) Electronically Signed   By: Misty Stanley M.D.   On: 05/30/2019 15:29        Scheduled Meds:  enoxaparin (LOVENOX) injection  30 mg Subcutaneous Q24H   levothyroxine  37.5 mcg Intravenous Daily   mouth rinse  15 mL Mouth Rinse BID   pantoprazole (PROTONIX) IV  40 mg Intravenous QHS   Continuous Infusions:  dextrose 5% lactated ringers 75 mL/hr at 06/01/19 0608   piperacillin-tazobactam (ZOSYN)  IV 2.25 g (06/01/19 1029)     LOS: 5 days    Time spent: 28 minutes.     Hosie Poisson, MD [facility] Pager [phone]  If 7PM-7AM, please contact night-coverage [email or web address] Password [facility] 06/01/2019, 12:45 PM

## 2019-06-01 NOTE — Plan of Care (Signed)
  Problem: Education: Goal: Knowledge of General Education information will improve Description: Including pain rating scale, medication(s)/side effects and non-pharmacologic comfort measures Outcome: Progressing   Problem: Health Behavior/Discharge Planning: Goal: Ability to manage health-related needs will improve Outcome: Progressing   Problem: Clinical Measurements: Goal: Will remain free from infection Outcome: Progressing   

## 2019-06-01 NOTE — Progress Notes (Addendum)
CC: Abdominal pain  Subjective: Her abdominal pain is about the same as yesterday.  She is very clear today that the pain is in the right lower quadrant.  Yesterday she was less clear, and over the weekend and last week it was diffuse abdominal pain.  She did get up with physical therapy and was up about 4 hours yesterday.  No BM yesterday.  Objective: Vital signs in last 24 hours: Temp:  [97.5 F (36.4 C)-98.4 F (36.9 C)] 98.4 F (36.9 C) (09/15 0519) Pulse Rate:  [58-65] 65 (09/15 0519) Resp:  [15-20] 16 (09/15 0519) BP: (122-135)/(61-96) 130/96 (09/15 0519) SpO2:  [92 %-98 %] 92 % (09/15 0519) Weight:  [61.1 kg] 61.1 kg (09/15 0552) Last BM Date: 05/26/19 Nothing p.o. recorded 950 IV 500 urine recorded No BM recorded Afebrile vital signs are stable 3 AM blood pressure was elevated. WBC of 13.8 this a.m. H/H = 9/27.8 Creatinine 3.04  Intake/Output from previous day: 09/14 0701 - 09/15 0700 In: 950 [I.V.:900; IV Piggyback:50] Out: 500 [Urine:500] Intake/Output this shift: No intake/output data recorded.  General appearance: alert, cooperative and no distress Resp: clear to auscultation bilaterally and Anterior exam GI: Soft, positive bowel sounds positive flatus, no BM.  On exam her pain is just in the right lower quadrant.  But it is not nearly as bad as it was in the past.  Lab Results:  Recent Labs    05/31/19 0416 06/01/19 0100  WBC 12.2* 13.8*  HGB 8.3* 9.0*  HCT 26.4* 27.8*  PLT 259 264    BMET Recent Labs    05/31/19 0416 06/01/19 0100  NA 136 139  K 3.6 4.0  CL 107 108  CO2 19* 21*  GLUCOSE 114* 122*  BUN 64* 60*  CREATININE 3.16* 3.04*  CALCIUM 7.6* 7.8*   PT/INR No results for input(s): LABPROT, INR in the last 72 hours.  Recent Labs  Lab 05/27/19 1246  AST 38  ALT 25  ALKPHOS 228*  BILITOT 0.4  PROT 6.1*  ALBUMIN 2.7*     Lipase  No results found for: LIPASE   Medications: . enoxaparin (LOVENOX) injection  30 mg  Subcutaneous Q24H  . influenza vaccine adjuvanted  0.5 mL Intramuscular Tomorrow-1000  . levothyroxine  37.5 mcg Intravenous Daily  . mouth rinse  15 mL Mouth Rinse BID  . pantoprazole (PROTONIX) IV  40 mg Intravenous QHS  . pneumococcal 23 valent vaccine  0.5 mL Intramuscular Tomorrow-1000   . dextrose 5% lactated ringers 75 mL/hr at 06/01/19 0608  . piperacillin-tazobactam (ZOSYN)  IV 2.25 g (06/01/19 0114)   Assessment/Plan Syncope AKI with CKD stage III   - Creatinine 3.91 >> 7.94(8/01)>>6.55(3/74)>>8.27(0/78)>>6.75(4/49) Chronic systolic heart failure/mild troponin elevation Hypertension Hypothyroid Hx breast cancer Fall with right proximal humeral fracture             To see Dr. 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 Multiple rounded, exophytic bilateral renal masses(cyst vs neoplasm) Bilateral pleural effusions/bilateral lower lobe atelectasis CT 9/13  Appendicitis with possible perforation             -Acute appendicitis with multiple appendicoliths and a small focal, contained perforation without abscess             - Leukocytosis      WBC11.7(9/10)>>13.5>>12.6(9/11)>>11.5(9/12)>> 12.1(9/13)>>12.2(9/14) >>13.8(9/15)             - CT 9/14: No substantial interval change; ill-defined appendix containing clustered small  stone versus larger stone 13 x 7 mm tiny focus of extraluminal gas no fluid/abscess noted.  FEN:IV fluids/ clear liquids ID: Flagyl/Cipro 9/10 x 1 dose; Zosyn 9/10 >>day 6 DVT: Lovenox Follow up: TBD  Plan: I will leave her on Zosyn and IV fluids.  We will continue clears liquids for now.  WBC is going back up but she is no more symptomatic, and her pain is actually improving although she still has pain in the right lower quadrant.    I am going to recheck her labs in the morning and make her n.p.o. after midnight just in case the WBC continues to climb.     LOS: 5 days    JENNINGS,WILLARD 06/01/2019 [phone]  Agree with above.  Her daughter, [name], is at the bedside.  Whereas Ms. Tomaselli had minimal RLQ pain yesterday, she is clearly more tender across her whole lower abdomen today.  She is also having chills.  If she seems to be continuing to get worse - I would plan appendectomy tomorrow.    Alphonsa Overall, MD, [facility] Pager: [phone] Office phone:  [phone]

## 2019-06-01 NOTE — Significant Event (Signed)
Rapid Response Event Note  Received a call from bedside RN, Ria Comment, notified me of a MEWS 2 (Yellow). Per nurse, no RRT intervention and MD was made aware.   - NO RRT INTERVENTIONS   Julee Stoll, [place]

## 2019-06-02 ENCOUNTER — Inpatient Hospital Stay (HOSPITAL_COMMUNITY): Payer: PPO | Admitting: Anesthesiology

## 2019-06-02 ENCOUNTER — Encounter (HOSPITAL_COMMUNITY)
Admission: EM | Disposition: A | Discharge status: Skilled Nursing Facility | Payer: Self-pay | Source: Home / Self Care | Attending: Family Medicine

## 2019-06-02 ENCOUNTER — Encounter (HOSPITAL_COMMUNITY): Payer: Self-pay | Admitting: Certified Registered Nurse Anesthetist

## 2019-06-02 HISTORY — PX: LAPAROSCOPIC APPENDECTOMY: SHX408

## 2019-06-02 LAB — TYPE AND SCREEN
ABO/RH(D): O POS
Antibody Screen: NEGATIVE

## 2019-06-02 LAB — BASIC METABOLIC PANEL
Anion gap: 11 (ref 5–15)
BUN: 55 mg/dL — ABNORMAL HIGH (ref 8–23)
CO2: 19 mmol/L — ABNORMAL LOW (ref 22–32)
Calcium: 7.7 mg/dL — ABNORMAL LOW (ref 8.9–10.3)
Chloride: 108 mmol/L (ref 98–111)
Creatinine, Ser: 3.05 mg/dL — ABNORMAL HIGH (ref 0.44–1.00)
GFR calc Af Amer: 14 mL/min — ABNORMAL LOW (ref >60)
GFR calc non Af Amer: 12 mL/min — ABNORMAL LOW (ref >60)
Glucose, Bld: 102 mg/dL — ABNORMAL HIGH (ref 70–99)
Potassium: 3.5 mmol/L (ref 3.5–5.1)
Sodium: 138 mmol/L (ref 135–145)

## 2019-06-02 LAB — CBC
HCT: 26.9 % — ABNORMAL LOW (ref 36.0–46.0)
Hemoglobin: 8.4 g/dL — ABNORMAL LOW (ref 12.0–15.0)
MCH: 27.4 pg (ref 26.0–34.0)
MCHC: 31.2 g/dL (ref 30.0–36.0)
MCV: 87.6 fL (ref 80.0–100.0)
Platelets: 305 10*3/uL (ref 150–400)
RBC: 3.07 MIL/uL — ABNORMAL LOW (ref 3.87–5.11)
RDW: 16.8 % — ABNORMAL HIGH (ref 11.5–15.5)
WBC: 22 10*3/uL — ABNORMAL HIGH (ref 4.0–10.5)
nRBC: 0.1 % (ref 0.0–0.2)

## 2019-06-02 LAB — SURGICAL PCR SCREEN
MRSA, PCR: NEGATIVE
Staphylococcus aureus: NEGATIVE

## 2019-06-02 SURGERY — APPENDECTOMY, LAPAROSCOPIC
Anesthesia: General | Site: Abdomen

## 2019-06-02 MED ORDER — FENTANYL CITRATE (PF) 250 MCG/5ML IJ SOLN
INTRAMUSCULAR | Status: AC
  Filled 2019-06-02: qty 5

## 2019-06-02 MED ORDER — 0.9 % SODIUM CHLORIDE (POUR BTL) OPTIME
TOPICAL | Status: DC | PRN
  Administered 2019-06-02: 10:00:00 1000 mL

## 2019-06-02 MED ORDER — ONDANSETRON HCL 4 MG/2ML IJ SOLN
INTRAMUSCULAR | Status: AC
  Filled 2019-06-02: qty 2

## 2019-06-02 MED ORDER — PROPOFOL 10 MG/ML IV BOLUS
INTRAVENOUS | Status: DC | PRN
  Administered 2019-06-02: 70 mg via INTRAVENOUS

## 2019-06-02 MED ORDER — METOPROLOL TARTRATE 5 MG/5ML IV SOLN
INTRAVENOUS | Status: DC | PRN
  Administered 2019-06-02: 2 mg via INTRAVENOUS

## 2019-06-02 MED ORDER — ONDANSETRON HCL 4 MG/2ML IJ SOLN
INTRAMUSCULAR | Status: DC | PRN
  Administered 2019-06-02: 4 mg via INTRAVENOUS

## 2019-06-02 MED ORDER — SODIUM CHLORIDE 0.9 % IV SOLN
INTRAVENOUS | Status: DC | PRN
  Administered 2019-06-02: 10:00:00 25 ug/min via INTRAVENOUS

## 2019-06-02 MED ORDER — KCL-LACTATED RINGERS-D5W 20 MEQ/L IV SOLN
INTRAVENOUS | Status: DC
  Administered 2019-06-02 – 2019-06-03 (×2): via INTRAVENOUS
  Filled 2019-06-02: qty 1000

## 2019-06-02 MED ORDER — ACETAMINOPHEN 325 MG PO TABS: 650.0000 mg | ORAL_TABLET | Freq: Three times a day (TID) | ORAL | Status: DC

## 2019-06-02 MED ORDER — SUGAMMADEX SODIUM 200 MG/2ML IV SOLN
INTRAVENOUS | Status: DC | PRN
  Administered 2019-06-02: 137.4 mg via INTRAVENOUS

## 2019-06-02 MED ORDER — ACETAMINOPHEN 10 MG/ML IV SOLN
1000.0000 mg | Freq: Three times a day (TID) | INTRAVENOUS | Status: AC
  Administered 2019-06-02 – 2019-06-03 (×3): 1000 mg via INTRAVENOUS
  Filled 2019-06-02 (×3): qty 100

## 2019-06-02 MED ORDER — ROCURONIUM BROMIDE 10 MG/ML (PF) SYRINGE
PREFILLED_SYRINGE | INTRAVENOUS | Status: DC | PRN
  Administered 2019-06-02: 30 mg via INTRAVENOUS

## 2019-06-02 MED ORDER — BUPIVACAINE-EPINEPHRINE 0.25% -1:200000 IJ SOLN
INTRAMUSCULAR | Status: DC | PRN
  Administered 2019-06-02: 30 mL

## 2019-06-02 MED ORDER — SODIUM CHLORIDE 0.9 % IR SOLN
Status: DC | PRN
  Administered 2019-06-02: 1
  Administered 2019-06-02: 3000 mL

## 2019-06-02 MED ORDER — KCL-LACTATED RINGERS-D5W 20 MEQ/L IV SOLN
INTRAVENOUS | Status: DC
  Filled 2019-06-02: qty 1000

## 2019-06-02 MED ORDER — ROCURONIUM BROMIDE 10 MG/ML (PF) SYRINGE
PREFILLED_SYRINGE | INTRAVENOUS | Status: AC
  Filled 2019-06-02: qty 10

## 2019-06-02 MED ORDER — PROPOFOL 10 MG/ML IV BOLUS
INTRAVENOUS | Status: AC
  Filled 2019-06-02: qty 20

## 2019-06-02 MED ORDER — FENTANYL CITRATE (PF) 250 MCG/5ML IJ SOLN
INTRAMUSCULAR | Status: DC | PRN
  Administered 2019-06-02 (×3): 25 ug via INTRAVENOUS

## 2019-06-02 MED ORDER — BUPIVACAINE-EPINEPHRINE (PF) 0.25% -1:200000 IJ SOLN
INTRAMUSCULAR | Status: AC
  Filled 2019-06-02: qty 30

## 2019-06-02 MED ORDER — LIDOCAINE 2% (20 MG/ML) 5 ML SYRINGE
INTRAMUSCULAR | Status: AC
  Filled 2019-06-02: qty 5

## 2019-06-02 MED ORDER — SODIUM CHLORIDE 0.9 % IV SOLN
INTRAVENOUS | Status: DC
  Administered 2019-06-02 – 2019-06-07 (×2): via INTRAVENOUS

## 2019-06-02 SURGICAL SUPPLY — 54 items
ADH SKN CLS APL DERMABOND .7 (GAUZE/BANDAGES/DRESSINGS) ×1
APL PRP STRL LF DISP 70% ISPRP (MISCELLANEOUS) ×1
APPLIER CLIP ROT 10 11.4 M/L (STAPLE)
APR CLP MED LRG 11.4X10 (STAPLE)
BAG SPEC RTRVL 10 TROC 200 (ENDOMECHANICALS) ×1
BLADE CLIPPER SURG (BLADE) IMPLANT
CANISTER SUCT 3000ML PPV (MISCELLANEOUS) ×2 IMPLANT
CATH FOLEY 2WAY  3CC 10FR (CATHETERS)
CATH FOLEY 2WAY 3CC 10FR (CATHETERS) IMPLANT
CATH FOLEY 2WAY SLVR  5CC 12FR (CATHETERS) ×1
CATH FOLEY 2WAY SLVR 5CC 12FR (CATHETERS) IMPLANT
CHLORAPREP W/TINT 26 (MISCELLANEOUS) ×2 IMPLANT
CLIP APPLIE ROT 10 11.4 M/L (STAPLE) IMPLANT
COVER SURGICAL LIGHT HANDLE (MISCELLANEOUS) ×2 IMPLANT
COVER WAND RF STERILE (DRAPES) ×2 IMPLANT
CUTTER FLEX LINEAR 45M (STAPLE) ×1 IMPLANT
DERMABOND ADVANCED (GAUZE/BANDAGES/DRESSINGS) ×1
DERMABOND ADVANCED .7 DNX12 (GAUZE/BANDAGES/DRESSINGS) ×1 IMPLANT
ELECT REM PT RETURN 9FT ADLT (ELECTROSURGICAL) ×2
ELECTRODE REM PT RTRN 9FT ADLT (ELECTROSURGICAL) ×1 IMPLANT
ENDOLOOP SUT PDS II  0 18 (SUTURE)
ENDOLOOP SUT PDS II 0 18 (SUTURE) IMPLANT
GLOVE SURG SYN 7.5  E (GLOVE) ×2
GLOVE SURG SYN 7.5 E (GLOVE) ×2 IMPLANT
GLOVE SURG SYN 7.5 PF PI (GLOVE) ×1 IMPLANT
GOWN STRL REUS W/ TWL LRG LVL3 (GOWN DISPOSABLE) ×2 IMPLANT
GOWN STRL REUS W/ TWL XL LVL3 (GOWN DISPOSABLE) ×1 IMPLANT
GOWN STRL REUS W/TWL LRG LVL3 (GOWN DISPOSABLE) ×4
GOWN STRL REUS W/TWL XL LVL3 (GOWN DISPOSABLE) ×2
KIT BASIN OR (CUSTOM PROCEDURE TRAY) ×2 IMPLANT
KIT TURNOVER KIT B (KITS) ×2 IMPLANT
NS IRRIG 1000ML POUR BTL (IV SOLUTION) ×2 IMPLANT
PAD ARMBOARD 7.5X6 YLW CONV (MISCELLANEOUS) ×4 IMPLANT
POUCH RETRIEVAL ECOSAC 10 (ENDOMECHANICALS) ×1 IMPLANT
POUCH RETRIEVAL ECOSAC 10MM (ENDOMECHANICALS) ×1
RELOAD 45 VASCULAR/THIN (ENDOMECHANICALS) IMPLANT
RELOAD STAPLE 45 2.5 WHT GRN (ENDOMECHANICALS) IMPLANT
RELOAD STAPLE 45 3.5 BLU ETS (ENDOMECHANICALS) IMPLANT
RELOAD STAPLE TA45 3.5 REG BLU (ENDOMECHANICALS) ×2 IMPLANT
SET IRRIG TUBING LAPAROSCOPIC (IRRIGATION / IRRIGATOR) ×2 IMPLANT
SET TUBE SMOKE EVAC HIGH FLOW (TUBING) ×2 IMPLANT
SHEARS HARMONIC ACE PLUS 36CM (ENDOMECHANICALS) ×2 IMPLANT
SLEEVE ENDOPATH XCEL 5M (ENDOMECHANICALS) ×2 IMPLANT
SPECIMEN JAR SMALL (MISCELLANEOUS) ×2 IMPLANT
SUT MNCRL AB 4-0 PS2 18 (SUTURE) ×2 IMPLANT
TOWEL GREEN STERILE (TOWEL DISPOSABLE) ×2 IMPLANT
TOWEL GREEN STERILE FF (TOWEL DISPOSABLE) ×2 IMPLANT
TRAY FOLEY W/BAG SLVR 16FR (SET/KITS/TRAYS/PACK) ×2
TRAY FOLEY W/BAG SLVR 16FR ST (SET/KITS/TRAYS/PACK) IMPLANT
TRAY LAPAROSCOPIC MC (CUSTOM PROCEDURE TRAY) ×2 IMPLANT
TROCAR ADV FIXATION 5X100MM (TROCAR) ×3 IMPLANT
TROCAR XCEL BLUNT TIP 100MML (ENDOMECHANICALS) ×2 IMPLANT
TROCAR XCEL NON-BLD 5MMX100MML (ENDOMECHANICALS) ×1 IMPLANT
WATER STERILE IRR 1000ML POUR (IV SOLUTION) ×2 IMPLANT

## 2019-06-02 NOTE — Progress Notes (Signed)
OT Cancellation Note  Patient Details Name: Mary Guzman MRN: [id number] DOB: 10/24/1920   Cancelled Treatment:     Pt declined OT session this afternoon, reports she doesn't think she can do much after her surgery this morning. Will check back as time allows.   [place], [place] [facility] [place] 06/02/2019, 4:12 PM

## 2019-06-02 NOTE — Progress Notes (Signed)
PROGRESS NOTE    Mary Guzman  WHQ:[id number] DOB: 19-Feb-1921 DOA: 05/27/2019 PCP: [name], NP    Brief Narrative:  85 year old lady with prior history of hypertension hypothyroidism presented to ED on 9/10/20204 lower abdominal pain and right arm pain.  CT of the abdomen and pelvis showed Acute appendicitis with multiple appendicoliths and a small focal, contained perforation without abscess.  24 hours prior to the arrival to ED on 9/10, pt had a syncopal episode and fell on her right arm.  X-rays of the right arm shows Acute impacted and slightly angulated fracture involving the right humeral neck. Orthopedics consulted and recommended NWB in sling and follow up with Dr [name] in the office in 2 to 3 weeks.   Surgery consulted for the acute appendicitis and recommended non operative treatment with IV antibiotics and if she does not improve she might need surgery.  Since she has persistent leukocytosis she underwent a repeat  CT abdomen and pelvis showing Ill-defined appendix containing a cluster of small stones versus larger stone measuring 13 x 7 mm, The tiny linear focus of extraluminal gas identified on the previous study is stable. No evidence for organized fluid collection on the current study to suggest abscess.  She had dropped her blood pressure and her leukocytosis got worse on the morning of 06/02/2019 so she was taken to the OR by surgery and underwent laparoscopic appendectomy.     Assessment & Plan:   Principal Problem:   Appendicitis Active Problems:   Hypothyroidism   Essential hypertension   GERD   Osteoarthritis   Chronic systolic CHF (congestive heart failure) (HCC)   CKD (chronic kidney disease), stage III (HCC)   Right humeral fracture   Acute appendicitis with contained perforation with multiple appendicoliths: Initially surgery recommended treating with IV antibiotics however she dropped her blood pressure today and her leukocytosis got worse so she was  emergently taken to the OR underwent laparoscopic cholecystectomy by general surgery today.  We will continue antibiotics for now.  She is doing much better postoperatively.  Right humeral fracture: Secondary to a fall.  Right shoulder sling was placed and nonweightbearing bearing recommended by orthopedics.  Outpatient follow-up with Dr. [name] on discharge. Continue with pain control for now and nonweightbearing on the right side. Physical therapy evaluation recommending SNF.  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  Syncope: Probably secondary to orthostatic versus vasovagal from acute appendicitis. PT OT to see her.  Acute on stage III CKD: Baseline creatinine around 2.5 and creatinine has worsened with a peak 3.3.  Probably secondary to ATN/dehydration.  Improving now.  Continue IV hydration.  Chronic diastolic heart failure: No signs of fluid overload. Last echocardiogram from 2018 showedWall thickness was increased in a pattern of moderate LVH. The estimated ejection  fraction was 55%. Wall motion was normal; there were no regional   wall motion abnormalities. Septal-lateral dyssynchrony. Doppler  parameters are consistent with abnormal left ventricular relaxation (grade 1 diastolic dysfunction). Once she is able to tolerate liquid diet we will slowly restart her diuretics.  Hypothyroidism: Continue Synthroid.  Anemia of chronic disease: Hemoglobin is stable for most part.  Continue to watch daily.  Elevated troponins probably from demand ischemia from acute appendicitis.  GERD stable  Hypertension: Blood pressure parameters are well controlled.  Continue current management.  DVT prophylaxis: Lovenox Code Status: DNR Family Communication: Discussed with son and daughter at bedside  Disposition Plan: TBD   Consultants:   General surgery.   Procedures: Laparoscopic cholecystectomy on  06/02/2019  Antimicrobials: zosyn since admission.   Subjective: Patient seen and examined after the surgery.   Daughter and son at the bedside.  Patient is completely alert and oriented.  She states that her abdominal pain is improved.   Objective: Vitals:   06/02/19 1241 06/02/19 1256 06/02/19 1305 06/02/19 1446  BP: (!) 98/54 (!) 103/49  (!) 93/50  Pulse: 72 71 70 73  Resp: 19 16  18   Temp:  98.2 F (36.8 C)  97.7 F (36.5 C)  TempSrc:    Oral  SpO2: 92% 94% 95% (!) 89%  Weight:      Height:        Intake/Output Summary (Last 24 hours) at 06/02/2019 1636 Last data filed at 06/02/2019 1351 Gross per 24 hour  Intake 1151.67 ml  Output 805 ml  Net 346.67 ml   Filed Weights   05/31/19 0457 06/01/19 0552 06/02/19 0500  Weight: 57.3 kg 61.1 kg 68.7 kg    Examination:  General exam: Appears calm and comfortable  Respiratory system: Diminished breath sounds at the bases. Respiratory effort normal. Cardiovascular system: S1 & S2 heard, RRR. No JVD, murmurs, rubs, gallops or clicks. No pedal edema. Gastrointestinal system: Abdomen is nondistended, soft and generalized tender. No organomegaly or masses felt. Normal bowel sounds heard. Central nervous system: Alert and oriented. No focal neurological deficits. Extremities: Symmetric 5 x 5 power. Skin: No rashes, lesions or ulcers.  Psychiatry: Judgement and insight appear normal. Mood & affect appropriate.   Data Reviewed: I have personally reviewed following labs and imaging studies  CBC: Recent Labs  Lab 05/27/19 1246  05/29/19 0342 05/30/19 0307 05/31/19 0416 06/01/19 0100 06/02/19 0130  WBC 11.7*   < > 11.5* 12.1* 12.2* 13.8* 22.0*  NEUTROABS 10.5*  --   --   --   --   --   --   HGB 9.9*   < > 8.7* 8.3* 8.3* 9.0* 8.4*  HCT 32.1*   < > 27.3* 26.4* 26.4* 27.8* 26.9*  MCV 89.4   < > 87.5 87.1 88.3 88.5 87.6  PLT 237   < > 241 247 259 264 305   < > = values in this interval not displayed.   Basic Metabolic Panel: Recent Labs  Lab 05/29/19 0342 05/30/19 0307 05/31/19 0416 06/01/19 0100 06/02/19 0130  NA 142 137 136 139  138  K 3.4* 3.7 3.6 4.0 3.5  CL 110 106 107 108 108  CO2 20* 20* 19* 21* 19*  GLUCOSE 120* 125* 114* 122* 102*  BUN 71* 70* 64* 60* 55*  CREATININE 3.35* 3.33* 3.16* 3.04* 3.05*  CALCIUM 8.1* 7.8* 7.6* 7.8* 7.7*   GFR: Estimated Creatinine Clearance: 10.3 mL/min (A) (by C-G formula based on SCr of 3.05 mg/dL (H)). Liver Function Tests: Recent Labs  Lab 05/27/19 1246  AST 38  ALT 25  ALKPHOS 228*  BILITOT 0.4  PROT 6.1*  ALBUMIN 2.7*   No results for input(s): LIPASE, AMYLASE in the last 168 hours. No results for input(s): AMMONIA in the last 168 hours. Coagulation Profile: No results for input(s): INR, PROTIME in the last 168 hours. Cardiac Enzymes: No results for input(s): CKTOTAL, CKMB, CKMBINDEX, TROPONINI in the last 168 hours. BNP (last 3 results) No results for input(s): PROBNP in the last 8760 hours. HbA1C: No results for input(s): HGBA1C in the last 72 hours. CBG: No results for input(s): GLUCAP in the last 168 hours. Lipid Profile: No results for input(s): CHOL, HDL, LDLCALC, TRIG, CHOLHDL,  LDLDIRECT in the last 72 hours. Thyroid Function Tests: No results for input(s): TSH, T4TOTAL, FREET4, T3FREE, THYROIDAB in the last 72 hours. Anemia Panel: Recent Labs    05/31/19 1247  VITAMINB12 1,109*  FOLATE 15.1  FERRITIN 243  TIBC 161*  IRON 18*  RETICCTPCT 1.2   Sepsis Labs: No results for input(s): PROCALCITON, LATICACIDVEN in the last 168 hours.  Recent Results (from the past 240 hour(s))  SARS Coronavirus 2 [facility] order, Performed in [facility] hospital lab) Nasopharyngeal Nasopharyngeal Swab     Status: None   Collection Time: 05/27/19  6:26 PM   Specimen: Nasopharyngeal Swab  Result Value Ref Range Status   SARS Coronavirus 2 NEGATIVE NEGATIVE Final    Comment: (NOTE) If result is NEGATIVE SARS-CoV-2 target nucleic acids are NOT DETECTED. The SARS-CoV-2 RNA is generally detectable in upper and lower  respiratory specimens during the acute  phase of infection. The lowest  concentration of SARS-CoV-2 viral copies this assay can detect is 250  copies / mL. A negative result does not preclude SARS-CoV-2 infection  and should not be used as the sole basis for treatment or other  patient management decisions.  A negative result may occur with  improper specimen collection / handling, submission of specimen other  than nasopharyngeal swab, presence of viral mutation(s) within the  areas targeted by this assay, and inadequate number of viral copies  (<250 copies / mL). A negative result must be combined with clinical  observations, patient history, and epidemiological information. If result is POSITIVE SARS-CoV-2 target nucleic acids are DETECTED. The SARS-CoV-2 RNA is generally detectable in upper and lower  respiratory specimens dur ing the acute phase of infection.  Positive  results are indicative of active infection with SARS-CoV-2.  Clinical  correlation with patient history and other diagnostic information is  necessary to determine patient infection status.  Positive results do  not rule out bacterial infection or co-infection with other viruses. If result is PRESUMPTIVE POSTIVE SARS-CoV-2 nucleic acids MAY BE PRESENT.   A presumptive positive result was obtained on the submitted specimen  and confirmed on repeat testing.  While 2019 novel coronavirus  (SARS-CoV-2) nucleic acids may be present in the submitted sample  additional confirmatory testing may be necessary for epidemiological  and / or clinical management purposes  to differentiate between  SARS-CoV-2 and other Sarbecovirus currently known to infect humans.  If clinically indicated additional testing with an alternate test  methodology [phone]) is advised. The SARS-CoV-2 RNA is generally  detectable in upper and lower respiratory sp ecimens during the acute  phase of infection. The expected result is Negative. Fact Sheet for Patients:   StrictlyIdeas.no Fact Sheet for Healthcare Providers: BankingDealers.co.za This test is not yet approved or cleared by the [place] FDA and has been authorized for detection and/or diagnosis of SARS-CoV-2 by FDA under an Emergency Use Authorization (EUA).  This EUA will remain in effect (meaning this test can be used) for the duration of the COVID-19 declaration under Section 564(b)(1) of the Act, 21 U.S.C. section 360bbb-3(b)(1), unless the authorization is terminated or revoked sooner. Performed at [facility], [place] [address], [place], [place] [address]   Surgical pcr screen     Status: None   Collection Time: 06/01/19 10:02 PM   Specimen: Nasal Mucosa; Nasal Swab  Result Value Ref Range Status   MRSA, PCR NEGATIVE NEGATIVE Final   Staphylococcus aureus NEGATIVE NEGATIVE Final    Comment: (NOTE) The Xpert SA Assay (  FDA approved for NASAL specimens in patients 74 years of age and older), is one component of a comprehensive surveillance program. It is not intended to diagnose infection nor to guide or monitor treatment. Performed at [facility], [place] [address] [place],  [address]     Radiology Studies: No results found.  Scheduled Meds:  enoxaparin (LOVENOX) injection  30 mg Subcutaneous Q24H   levothyroxine  37.5 mcg Intravenous Daily   mouth rinse  15 mL Mouth Rinse BID   pantoprazole (PROTONIX) IV  40 mg Intravenous QHS   Continuous Infusions:  sodium chloride 10 mL/hr at 06/02/19 0935   acetaminophen 1,000 mg (06/02/19 1457)   dextrose 5% lactated ringers with KCl 20 mEq/L 75 mL/hr at 06/02/19 1342   piperacillin-tazobactam (ZOSYN)  IV 2.25 g (06/02/19 0015)     LOS: 6 days   Time spent: 31 minutes.   Darliss Cheney, MD [facility] Pager [phone]  If 7PM-7AM, please contact night-coverage [email or web address] Password [facility] 06/02/2019, 4:36 PM

## 2019-06-02 NOTE — Anesthesia Postprocedure Evaluation (Signed)
Anesthesia Post Note  Patient: Mary Guzman  Procedure(s) Performed: APPENDECTOMY LAPAROSCOPIC (N/A Abdomen)     Patient location during evaluation: PACU Anesthesia Type: General Level of consciousness: sedated Pain management: pain level controlled Vital Signs Assessment: post-procedure vital signs reviewed and stable Respiratory status: spontaneous breathing and respiratory function stable Cardiovascular status: stable Postop Assessment: no apparent nausea or vomiting Anesthetic complications: no    Last Vitals:  Vitals:   06/02/19 1256 06/02/19 1305  BP: (!) 103/49   Pulse: 71 70  Resp: 16   Temp: 36.8 C   SpO2: 94% 95%    Last Pain:  Vitals:   06/02/19 1256  TempSrc:   PainSc: 0-No pain                 Jesusa Stenerson DANIEL

## 2019-06-02 NOTE — Anesthesia Preprocedure Evaluation (Addendum)
Anesthesia Evaluation  Patient identified by MRN, date of birth, ID band Patient awake    Reviewed: Allergy & Precautions, NPO status , Patient's Chart, lab work & pertinent test results  Airway Mallampati: I  TM Distance: >3 FB     Dental  (+) Edentulous Upper, Edentulous Lower, Dental Advisory Given   Pulmonary neg pulmonary ROS,    Pulmonary exam normal        Cardiovascular hypertension, Pt. on home beta blockers and Pt. on medications negative cardio ROS Normal cardiovascular exam     Neuro/Psych negative neurological ROS     GI/Hepatic Neg liver ROS, GERD  ,  Endo/Other  Hypothyroidism   Renal/GU Renal InsufficiencyRenal disease     Musculoskeletal  (+) Arthritis ,   Abdominal   Peds  Hematology negative hematology ROS (+)   Anesthesia Other Findings Day of surgery medications reviewed with the patient.  Reproductive/Obstetrics                            Anesthesia Physical Anesthesia Plan  ASA: III  Anesthesia Plan: General   Post-op Pain Management:    Induction: Intravenous  PONV Risk Score and Plan: 3 and Ondansetron and Dexamethasone  Airway Management Planned: Oral ETT  Additional Equipment:   Intra-op Plan:   Post-operative Plan: Extubation in OR  Informed Consent: I have reviewed the patients History and Physical, chart, labs and discussed the procedure including the risks, benefits and alternatives for the proposed anesthesia with the patient or authorized representative who has indicated his/her understanding and acceptance.     Dental advisory given  Plan Discussed with: CRNA, Anesthesiologist and Surgeon  Anesthesia Plan Comments:        Anesthesia Quick Evaluation

## 2019-06-02 NOTE — Transfer of Care (Signed)
Immediate Anesthesia Transfer of Care Note  Patient: Mary Guzman  Procedure(s) Performed: APPENDECTOMY LAPAROSCOPIC (N/A Abdomen)  Patient Location: PACU  Anesthesia Type:General  Level of Consciousness: awake, alert  and oriented  Airway & Oxygen Therapy: Patient Spontanous Breathing and Patient connected to face mask oxygen  Post-op Assessment: Report given to RN and Post -op Vital signs reviewed and stable  Post vital signs: Reviewed and stable  Last Vitals:  Vitals Value Taken Time  BP 107/61 06/02/19 1211  Temp    Pulse 85 06/02/19 1216  Resp 23 06/02/19 1217  SpO2 99 % 06/02/19 1216  Vitals shown include unvalidated device data.  Last Pain:  Vitals:   06/02/19 0852  TempSrc: Oral  PainSc:       Patients Stated Pain Goal: 5 (72/82/06 0156)  Complications: No apparent anesthesia complications

## 2019-06-02 NOTE — Progress Notes (Addendum)
CC: Abdominal pain  Subjective: Patient got a rapid response because of low-grade fever and slight drop in blood pressure.  She is more tender this a.m. than yesterday.  Her WBC has gone up.  Objective: Vital signs in last 24 hours: Temp:  [98.2 F (36.8 C)-100.5 F (38.1 C)] 99.2 F (37.3 C) (09/16 0455) Pulse Rate:  [50-70] 67 (09/16 0455) Resp:  [16-20] 16 (09/16 0455) BP: (92-139)/(39-67) 116/47 (09/16 0455) SpO2:  [91 %-96 %] 96 % (09/16 0455) Weight:  [68.7 kg] 68.7 kg (09/16 0500) Last BM Date: 05/26/19 Nothing p.o. recorded 550 IV recorded Urine 250 BM x1 T-max 100.5, blood pressure 97/48 at 2300 hrs Labs show a creatinine at 3.05.  Electrolytes are stable. WBC up to 22.0 H/H8 0.4/26.9.   Intake/Output from previous day: 09/15 0701 - 09/16 0700 In: 551.7 [I.V.:501.7; IV Piggyback:50] Out: 250 [Urine:250] Intake/Output this shift: No intake/output data recorded.  General appearance: alert, cooperative and no distress Resp: clear to auscultation bilaterally GI: More tender especially in the right lower quadrant than yesterday.  Tenderness is a little more diffuse than yesterday.  Positive bowel sounds.  She does have a BM recorded for yesterday.  Lab Results:  Recent Labs    06/01/19 0100 06/02/19 0130  WBC 13.8* 22.0*  HGB 9.0* 8.4*  HCT 27.8* 26.9*  PLT 264 305    BMET Recent Labs    06/01/19 0100 06/02/19 0130  NA 139 138  K 4.0 3.5  CL 108 108  CO2 21* 19*  GLUCOSE 122* 102*  BUN 60* 55*  CREATININE 3.04* 3.05*  CALCIUM 7.8* 7.7*   PT/INR No results for input(s): LABPROT, INR in the last 72 hours.  Recent Labs  Lab 05/27/19 1246  AST 38  ALT 25  ALKPHOS 228*  BILITOT 0.4  PROT 6.1*  ALBUMIN 2.7*     Lipase  No results found for: LIPASE   Medications: . enoxaparin (LOVENOX) injection  30 mg Subcutaneous Q24H  . levothyroxine  37.5 mcg Intravenous Daily  . mouth rinse  15 mL Mouth Rinse BID  . pantoprazole (PROTONIX)  IV  40 mg Intravenous QHS    Assessment/Plan Syncope AKI with CKD stage III - Creatinine 3.91 >>5.46(5/03)>>5.46(5/68)>>1.27(5/17)>>0.01(7/49) Chronic systolic heart failure/mild troponin elevation Hypertension Hypothyroid Hx breast cancer Fall with right proximal humeral fracture To see Dr. 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  FEN:IV fluids/ clear liquids ID: Flagyl/Cipro 9/10 x 1 dose; Zosyn 9/10 >>day6 DVT: Lovenox Follow up: TBD POC: [facility] Daughter [phone]  [phone]  ALESSIA, GONSALEZ [phone]     Plan: With a rise in WBC, low-grade fever, and increased tenderness we have recommended going forward with surgery.  I have called the daughter informed her of our recommendation.    The nursing staff is working on getting the daughter in early, and seeing if her brother can at least be in the waiting room.  I discussed the recommendations with the patient and she is in agreement.     LOS: 6 days   JENNINGS,WILLARD 06/02/2019 [phone]  Agree  with above.  Though she seemed better early in the week, she is definitely worse over the last 24 hours. Will plan lap appendectomy today.   Will has spoken to the daughter, [name].  I reviewed the findings with the daughter.   Both daughter and son are in the room with the patient.  Alphonsa Overall, MD, [facility] Pager: [phone] Office phone:  [phone]

## 2019-06-02 NOTE — Op Note (Addendum)
Re:   Mary Guzman DOB:   [birth year] MRN:   [id number]                   FACILITY:  [facility]  DATE OF PROCEDURE: 06/02/2019                              OPERATIVE REPORT  PREOPERATIVE DIAGNOSIS:  Perforated sppendicitis  POSTOPERATIVE DIAGNOSIS:  Acute perforated appendicitis with abscess formation.  30 minutes of enterolysis.  PROCEDURE:  Laparoscopic appendectomy.  SURGEON:  Fenton Malling. Lucia Gaskins, MD  ASSISTANT:  No first assistant.  ANESTHESIA:  General endotracheal.  Anesthesiologist: Duane Boston, MD CRNA: Alain Marion, CRNA; Maness, Kathrin Penner, CRNA  ASA:  3E  ESTIMATED BLOOD LOSS:  Minimal.  DRAINS: 19 F Blake drain  SPECIMEN:   Appendix  COUNTS CORRECT:  YES  INDICATIONS FOR PROCEDURE: Mary Guzman is a 85 y.o. (DOB: 09/01/1921) white female whose primary care doctor is Nafziger, Tommi Rumps, NP and comes to the OR for an appendectomy.   The patient was originally hospitalized on 9/10.  Because of her age and health, she was initially treated with IV antibiotics in the hope the appendicitis could be treated non operatively, but over the last 24 hours she has become more tender and her WBC has gone up.  I discussed with the patient, the indications and potential complications of appendiceal surgery.  The potential complications include, but are not limited to, bleeding, open surgery, bowel resection, and the possibility of another diagnosis.  OPERATIVE NOTE:  The patient underwent a general endotracheal anesthetic as supervised by Anesthesiologist: Duane Boston, MD CRNA: Alain Marion, CRNA; Maness, Kathrin Penner, CRNA, General, in [facility] room #1.  The patient was on Zosyn prior to the beginning of the procedure and the abdomen was prepped with ChloraPrep.   A time-out was held and surgical checklist run.  An infraumbilical incision was made with sharp dissection carried down to the abdominal cavity.  An 12 mm Hasson trocar was inserted through the infraumbilical incision  and into the peritoneal cavity.  A 30 degree 5 mm laparoscope was inserted through a 12 mm Hasson trocar and the Hasson trocar secured with a 0 Vicryl suture.  I placed a 5 mm trocar in the right upper quadrant and a 5 mm torcar in left lower quadrant and did abdominal exploration.    The right and left lobes of liver unremarkable.  Stomach was unremarkable.    The patient had an abscess and fluid in the right lower abdomen.  I spent 30 minutes entero lysing adhesions around the abscess.  She had multiple loops of small bowel surrounding the abscess.  The patient's right colon was also stuck to the right lateral peritoneal wall and I had to sharply dissect the right colon off the lateral peritoneal surface.  The appendix came out on the "undersurface" of the right colon.  The appendix was ruptured mid-body.   The mesentery of the appendix was divided with a Harmonic scalpel once I had completed the cecal mobilization.  I got to the base of the appendix.  I then used a blue load 45 mm Ethicon Endo-GIA stapler and fired this across the base of the appendix.  I placed the appendix in Eco Sac bag and delivered the bag through the umbilical incision.  I irrigated the abdomen with 4,000 cc of saline.  I tried to irrigate the  peritoneum until all the purulence was gone.  I did place a 26 F Blake drain in the RLQ and laid it in the center of the abscess.  The drain was sewn in place with a 2-0 nylon.  After irrigating the abdomen, I then removed the trocars, in turn.  The umbilical port fascia was closed with 0 Vicryl suture.   I closed the skin each site with a 4-0 Monocryl suture and painted the wounds with DermaBond.  I then injected a total of 30 mL of 0.25% Marcaine at the incisions.  Sponge and needle count were correct at the end of the case.  The patient was transferred to the recovery room in good condition.  The patient tolerated the procedure well and it depends on the patient's post op clinical course  as to when the patient could be discharged.   Alphonsa Overall, MD, [facility] Surgery Pager: [phone] Office phone:  [phone]

## 2019-06-02 NOTE — Progress Notes (Signed)
Pharmacy Antibiotic Note  Mary Guzman is a 85 y.o. female admitted on 05/27/2019 with Intra-abdominal infection. Pharmacy has been consulted for Zosyn dosing. Patient post-op. Renal function stable.    Plan: Continue Zosyn 2.25 g IV Q8H Monitor renal function Monitor C/s     Height: 5' 4.02" (162.6 cm) Weight: 151 lb 7.3 oz (68.7 kg) IBW/kg (Calculated) : 54.74  Temp (24hrs), Avg:98.7 F (37.1 C), Min:98 F (36.7 C), Max:100.5 F (38.1 C)  Recent Labs  Lab 05/29/19 0342 05/30/19 0307 05/31/19 0416 06/01/19 0100 06/02/19 0130  WBC 11.5* 12.1* 12.2* 13.8* 22.0*  CREATININE 3.35* 3.33* 3.16* 3.04* 3.05*    Estimated Creatinine Clearance: 10.3 mL/min (A) (by C-G formula based on SCr of 3.05 mg/dL (H)).    No Known Allergies  Antimicrobials this admission: Ceftriaxone 9/10 Flagyl 9/10 Zosyn 9/10 >>  Microbiology: 9/10 COVID-19: Negative     Lux Meaders A. Levada Dy, PharmD, BCPS, FNKF Clinical Pharmacist [place] Please utilize Amion for appropriate phone number to reach the unit pharmacist ([place])

## 2019-06-02 NOTE — Progress Notes (Signed)
PT Cancellation Note  Patient Details Name: Mary Guzman MRN: [id number] DOB: [birth year]   Cancelled Treatment:    Reason Eval/Treat Not Completed: Patient at procedure or test/unavailable (per chart, pt for lap appendectomy today). Will follow-up as schedule permits.  Mabeline Caras, PT, DPT Acute Rehabilitation Services  Pager [phone] Office [place] 06/02/2019, 11:41 AM

## 2019-06-02 NOTE — Anesthesia Procedure Notes (Signed)
Procedure Name: Intubation Date/Time: 06/02/2019 10:19 AM Performed by: Alain Marion, CRNA Pre-anesthesia Checklist: Patient identified, Emergency Drugs available, Suction available and Patient being monitored Patient Re-evaluated:Patient Re-evaluated prior to induction Oxygen Delivery Method: Circle System Utilized Preoxygenation: Pre-oxygenation with 100% oxygen Induction Type: IV induction Ventilation: Mask ventilation without difficulty Laryngoscope Size: Miller and 2 Grade View: Grade I Tube type: Oral Tube size: 6.5 mm Number of attempts: 1 Airway Equipment and Method: Stylet Placement Confirmation: ETT inserted through vocal cords under direct vision,  positive ETCO2 and breath sounds checked- equal and bilateral Secured at: 20 cm Tube secured with: Tape Dental Injury: Teeth and Oropharynx as per pre-operative assessment

## 2019-06-03 ENCOUNTER — Encounter (HOSPITAL_COMMUNITY): Payer: Self-pay | Admitting: Surgery

## 2019-06-03 LAB — COMPREHENSIVE METABOLIC PANEL
ALT: 9 U/L (ref 0–44)
AST: 16 U/L (ref 15–41)
Albumin: 1.3 g/dL — ABNORMAL LOW (ref 3.5–5.0)
Alkaline Phosphatase: 169 U/L — ABNORMAL HIGH (ref 38–126)
Anion gap: 10 (ref 5–15)
BUN: 58 mg/dL — ABNORMAL HIGH (ref 8–23)
CO2: 20 mmol/L — ABNORMAL LOW (ref 22–32)
Calcium: 7.4 mg/dL — ABNORMAL LOW (ref 8.9–10.3)
Chloride: 109 mmol/L (ref 98–111)
Creatinine, Ser: 3.49 mg/dL — ABNORMAL HIGH (ref 0.44–1.00)
GFR calc Af Amer: 12 mL/min — ABNORMAL LOW (ref >60)
GFR calc non Af Amer: 10 mL/min — ABNORMAL LOW (ref >60)
Glucose, Bld: 113 mg/dL — ABNORMAL HIGH (ref 70–99)
Potassium: 4.2 mmol/L (ref 3.5–5.1)
Sodium: 139 mmol/L (ref 135–145)
Total Bilirubin: 0.5 mg/dL (ref 0.3–1.2)
Total Protein: 5 g/dL — ABNORMAL LOW (ref 6.5–8.1)

## 2019-06-03 LAB — CBC
HCT: 25.9 % — ABNORMAL LOW (ref 36.0–46.0)
Hemoglobin: 8.4 g/dL — ABNORMAL LOW (ref 12.0–15.0)
MCH: 28.9 pg (ref 26.0–34.0)
MCHC: 32.4 g/dL (ref 30.0–36.0)
MCV: 89 fL (ref 80.0–100.0)
Platelets: 300 10*3/uL (ref 150–400)
RBC: 2.91 MIL/uL — ABNORMAL LOW (ref 3.87–5.11)
RDW: 17.2 % — ABNORMAL HIGH (ref 11.5–15.5)
WBC: 18.7 10*3/uL — ABNORMAL HIGH (ref 4.0–10.5)
nRBC: 0 % (ref 0.0–0.2)

## 2019-06-03 LAB — URINALYSIS, ROUTINE W REFLEX MICROSCOPIC
Bilirubin Urine: NEGATIVE
Glucose, UA: NEGATIVE mg/dL
Hgb urine dipstick: NEGATIVE
Ketones, ur: NEGATIVE mg/dL
Nitrite: NEGATIVE
Protein, ur: NEGATIVE mg/dL
Specific Gravity, Urine: 1.021 (ref 1.005–1.030)
pH: 5 (ref 5.0–8.0)

## 2019-06-03 LAB — OSMOLALITY, URINE: Osmolality, Ur: 377 mOsm/kg (ref 300–900)

## 2019-06-03 LAB — SODIUM, URINE, RANDOM: Sodium, Ur: 10 mmol/L

## 2019-06-03 LAB — PREALBUMIN: Prealbumin: 6.3 mg/dL — ABNORMAL LOW (ref 18–38)

## 2019-06-03 LAB — SURGICAL PATHOLOGY

## 2019-06-03 MED ORDER — BOOST / RESOURCE BREEZE PO LIQD CUSTOM
1.0000 | Freq: Three times a day (TID) | ORAL | Status: DC
  Administered 2019-06-03 – 2019-06-08 (×12): 1 via ORAL

## 2019-06-03 MED ORDER — METRONIDAZOLE IN NACL 5-0.79 MG/ML-% IV SOLN
500.0000 mg | Freq: Three times a day (TID) | INTRAVENOUS | Status: DC
  Administered 2019-06-03 – 2019-06-07 (×13): 500 mg via INTRAVENOUS
  Filled 2019-06-03 (×13): qty 100

## 2019-06-03 MED ORDER — POTASSIUM CHLORIDE IN NACL 20-0.9 MEQ/L-% IV SOLN
INTRAVENOUS | Status: DC
  Administered 2019-06-03 – 2019-06-04 (×5): via INTRAVENOUS
  Filled 2019-06-03 (×6): qty 1000

## 2019-06-03 MED ORDER — SODIUM CHLORIDE 0.9 % IV SOLN
2.0000 g | INTRAVENOUS | Status: DC
  Administered 2019-06-03 – 2019-06-07 (×5): 2 g via INTRAVENOUS
  Filled 2019-06-03: qty 20
  Filled 2019-06-03 (×4): qty 2
  Filled 2019-06-03: qty 20

## 2019-06-03 MED ORDER — ADULT MULTIVITAMIN W/MINERALS CH
1.0000 | ORAL_TABLET | Freq: Every day | ORAL | Status: DC
  Administered 2019-06-03 – 2019-06-08 (×6): 1 via ORAL
  Filled 2019-06-03 (×6): qty 1

## 2019-06-03 MED ORDER — SODIUM CHLORIDE 0.9 % IV SOLN: INTRAVENOUS | Status: DC

## 2019-06-03 NOTE — Progress Notes (Signed)
PROGRESS NOTE    Mary Guzman  LYY:[id number] DOB: [birth year] DOA: 05/27/2019 PCP: [name], NP    Brief Narrative:  85 year old lady with prior history of hypertension hypothyroidism presented to ED on 9/10/20204 lower abdominal pain and right arm pain.  CT of the abdomen and pelvis showed Acute appendicitis with multiple appendicoliths and a small focal, contained perforation without abscess.  24 hours prior to the arrival to ED on 9/10, pt had a syncopal episode and fell on her right arm.  X-rays of the right arm shows Acute impacted and slightly angulated fracture involving the right humeral neck. Orthopedics consulted and recommended NWB in sling and follow up with Dr [name] in the office in 2 to 3 weeks.   Surgery consulted for the acute appendicitis and recommended non operative treatment with IV antibiotics and if she does not improve she might need surgery.  Since she has persistent leukocytosis she underwent a repeat  CT abdomen and pelvis showing Ill-defined appendix containing a cluster of small stones versus larger stone measuring 13 x 7 mm, The tiny linear focus of extraluminal gas identified on the previous study is stable. No evidence for organized fluid collection on the current study to suggest abscess.  She had dropped her blood pressure and her leukocytosis got worse on the morning of 06/02/2019 so she was taken to the OR by surgery and underwent laparoscopic appendectomy.     Assessment & Plan:   Principal Problem:   Appendicitis Active Problems:   Hypothyroidism   Essential hypertension   GERD   Osteoarthritis   Chronic systolic CHF (congestive heart failure) (HCC)   CKD (chronic kidney disease), stage III (HCC)   Right humeral fracture   Acute appendicitis with contained perforation with multiple appendicoliths: Initially surgery recommended treating with IV antibiotics however she dropped her blood pressure today and her leukocytosis got worse so she was  emergently taken to the OR underwent laparoscopic cholecystectomy by general surgery today.  We will continue antibiotics for now.  She is doing much better postoperatively.  Further management per general surgery.  Right humeral fracture: Secondary to a fall.  Right shoulder sling was placed and nonweightbearing bearing recommended by orthopedics.  Outpatient follow-up with Dr. [name] on discharge. Continue with pain control for now and nonweightbearing on the right side. Physical therapy evaluation recommending SNF.  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  Syncope: Probably secondary to orthostatic versus vasovagal from acute appendicitis. PT OT to see her.  Acute on stage III CKD: Baseline creatinine around 2.5 and creatinine has worsened with a peak 3.3.  Slightly worse today and decreased urine output has also low blood pressure.  This is likely secondary to ATN/prerenal.  Will order random urine sodium, UA, urine osmolarity and Fena.  We will switch her IV fluids to normal saline at 250 cc for 4-hour.  Hopefully she will pick up urine output.  If not then we will give her another fluid bolus.  Have to be cautious with her being 85 year old had having diastolic congestive heart failure history.  Chronic diastolic heart failure: No signs of fluid overload. Last echocardiogram from 2018 showedWall thickness was increased in a pattern of moderate LVH. The estimated ejection  fraction was 55%. Wall motion was normal; there were no regional   wall motion abnormalities. Septal-lateral dyssynchrony. Doppler  parameters are consistent with abnormal left ventricular relaxation (grade 1 diastolic dysfunction). Once she is able to tolerate liquid diet we will slowly restart her diuretics.  Hypothyroidism: Continue  Synthroid.  Anemia of chronic disease: Hemoglobin is stable for most part.  Continue to watch daily.  Elevated troponins probably from demand ischemia from acute appendicitis.  GERD stable  Hypertension:  Blood pressure on the low side.  Not on any antihypertensives currently.  IV fluids will be given as mentioned above.  DVT prophylaxis: Lovenox Code Status: DNR Family Communication: Discussed with son and daughter at bedside on 06/02/2019.  No family member present at bedside today. Disposition Plan: TBD   Consultants:   General surgery.   Procedures: Laparoscopic cholecystectomy on 06/02/2019  Antimicrobials: zosyn since admission.   Subjective: Patient seen and examined early this morning.  No family member was present at bedside.  Patient was slightly sleepy but still oriented x4.  Complained of some abdominal pain.  No other complaint.   Objective: Vitals:   06/02/19 2057 06/03/19 0246 06/03/19 0500 06/03/19 0500  BP: (!) 100/50 (!) 92/41  (!) 104/56  Pulse: 65 (!) 58  (!) 57  Resp: 15 16  18   Temp: 98.1 F (36.7 C) 97.9 F (36.6 C)  97.8 F (36.6 C)  TempSrc: Oral Oral  Oral  SpO2: 94% 90%  94%  Weight:   70.3 kg   Height:        Intake/Output Summary (Last 24 hours) at 06/03/2019 1213 Last data filed at 06/03/2019 0508 Gross per 24 hour  Intake 1225 ml  Output 765 ml  Net 460 ml   Filed Weights   06/01/19 0552 06/02/19 0500 06/03/19 0500  Weight: 61.1 kg 68.7 kg 70.3 kg    Examination:  General exam: Appears calm and comfortable but slightly sleepy today. Respiratory system: Diminished breath sounds at the bases.Marland Kitchen Respiratory effort normal. Cardiovascular system: S1 & S2 heard, RRR. No JVD, murmurs, rubs, gallops or clicks. No pedal edema. Gastrointestinal system: Abdomen is nondistended, soft and generalized tender. No organomegaly or masses felt. Normal bowel sounds heard.  Laparoscopy incision marks noted. Central nervous system: Sleepy but oriented x4. No focal neurological deficits. Extremities: Symmetric 5 x 5 power. Skin: No rashes, lesions or ulcers.  Psychiatry: Judgement and insight appear normal. Mood & affect appropriate.   Data Reviewed: I  have personally reviewed following labs and imaging studies  CBC: Recent Labs  Lab 05/27/19 1246  05/30/19 0307 05/31/19 0416 06/01/19 0100 06/02/19 0130 06/03/19 0632  WBC 11.7*   < > 12.1* 12.2* 13.8* 22.0* 18.7*  NEUTROABS 10.5*  --   --   --   --   --   --   HGB 9.9*   < > 8.3* 8.3* 9.0* 8.4* 8.4*  HCT 32.1*   < > 26.4* 26.4* 27.8* 26.9* 25.9*  MCV 89.4   < > 87.1 88.3 88.5 87.6 89.0  PLT 237   < > 247 259 264 305 300   < > = values in this interval not displayed.   Basic Metabolic Panel: Recent Labs  Lab 05/30/19 0307 05/31/19 0416 06/01/19 0100 06/02/19 0130 06/03/19 0632  NA 137 136 139 138 139  K 3.7 3.6 4.0 3.5 4.2  CL 106 107 108 108 109  CO2 20* 19* 21* 19* 20*  GLUCOSE 125* 114* 122* 102* 113*  BUN 70* 64* 60* 55* 58*  CREATININE 3.33* 3.16* 3.04* 3.05* 3.49*  CALCIUM 7.8* 7.6* 7.8* 7.7* 7.4*   GFR: Estimated Creatinine Clearance: 9.1 mL/min (A) (by C-G formula based on SCr of 3.49 mg/dL (H)). Liver Function Tests: Recent Labs  Lab 05/27/19 1246 06/03/19 1660  AST 38 16  ALT 25 9  ALKPHOS 228* 169*  BILITOT 0.4 0.5  PROT 6.1* 5.0*  ALBUMIN 2.7* 1.3*   No results for input(s): LIPASE, AMYLASE in the last 168 hours. No results for input(s): AMMONIA in the last 168 hours. Coagulation Profile: No results for input(s): INR, PROTIME in the last 168 hours. Cardiac Enzymes: No results for input(s): CKTOTAL, CKMB, CKMBINDEX, TROPONINI in the last 168 hours. BNP (last 3 results) No results for input(s): PROBNP in the last 8760 hours. HbA1C: No results for input(s): HGBA1C in the last 72 hours. CBG: No results for input(s): GLUCAP in the last 168 hours. Lipid Profile: No results for input(s): CHOL, HDL, LDLCALC, TRIG, CHOLHDL, LDLDIRECT in the last 72 hours. Thyroid Function Tests: No results for input(s): TSH, T4TOTAL, FREET4, T3FREE, THYROIDAB in the last 72 hours. Anemia Panel: Recent Labs    05/31/19 1247  VITAMINB12 1,109*  FOLATE 15.1   FERRITIN 243  TIBC 161*  IRON 18*  RETICCTPCT 1.2   Sepsis Labs: No results for input(s): PROCALCITON, LATICACIDVEN in the last 168 hours.  Recent Results (from the past 240 hour(s))  SARS Coronavirus 2 [facility] order, Performed in [facility] hospital lab) Nasopharyngeal Nasopharyngeal Swab     Status: None   Collection Time: 05/27/19  6:26 PM   Specimen: Nasopharyngeal Swab  Result Value Ref Range Status   SARS Coronavirus 2 NEGATIVE NEGATIVE Final    Comment: (NOTE) If result is NEGATIVE SARS-CoV-2 target nucleic acids are NOT DETECTED. The SARS-CoV-2 RNA is generally detectable in upper and lower  respiratory specimens during the acute phase of infection. The lowest  concentration of SARS-CoV-2 viral copies this assay can detect is 250  copies / mL. A negative result does not preclude SARS-CoV-2 infection  and should not be used as the sole basis for treatment or other  patient management decisions.  A negative result may occur with  improper specimen collection / handling, submission of specimen other  than nasopharyngeal swab, presence of viral mutation(s) within the  areas targeted by this assay, and inadequate number of viral copies  (<250 copies / mL). A negative result must be combined with clinical  observations, patient history, and epidemiological information. If result is POSITIVE SARS-CoV-2 target nucleic acids are DETECTED. The SARS-CoV-2 RNA is generally detectable in upper and lower  respiratory specimens dur ing the acute phase of infection.  Positive  results are indicative of active infection with SARS-CoV-2.  Clinical  correlation with patient history and other diagnostic information is  necessary to determine patient infection status.  Positive results do  not rule out bacterial infection or co-infection with other viruses. If result is PRESUMPTIVE POSTIVE SARS-CoV-2 nucleic acids MAY BE PRESENT.   A presumptive positive result was obtained on the  submitted specimen  and confirmed on repeat testing.  While 2019 novel coronavirus  (SARS-CoV-2) nucleic acids may be present in the submitted sample  additional confirmatory testing may be necessary for epidemiological  and / or clinical management purposes  to differentiate between  SARS-CoV-2 and other Sarbecovirus currently known to infect humans.  If clinically indicated additional testing with an alternate test  methodology [phone]) is advised. The SARS-CoV-2 RNA is generally  detectable in upper and lower respiratory sp ecimens during the acute  phase of infection. The expected result is Negative. Fact Sheet for Patients:  StrictlyIdeas.no Fact Sheet for Healthcare Providers: BankingDealers.co.za This test is not yet approved or cleared by the [place] FDA and has  been authorized for detection and/or diagnosis of SARS-CoV-2 by FDA under an Emergency Use Authorization (EUA).  This EUA will remain in effect (meaning this test can be used) for the duration of the COVID-19 declaration under Section 564(b)(1) of the Act, 21 U.S.C. section 360bbb-3(b)(1), unless the authorization is terminated or revoked sooner. Performed at [facility], [place] [address] [place], [place] [address]   Surgical pcr screen     Status: None   Collection Time: 06/01/19 10:02 PM   Specimen: Nasal Mucosa; Nasal Swab  Result Value Ref Range Status   MRSA, PCR NEGATIVE NEGATIVE Final   Staphylococcus aureus NEGATIVE NEGATIVE Final    Comment: (NOTE) The Xpert SA Assay (FDA approved for NASAL specimens in patients 53 years of age and older), is one component of a comprehensive surveillance program. It is not intended to diagnose infection nor to guide or monitor treatment. Performed at [facility], [place] [address], [place], [place] [address]     Radiology Studies: No results found.  Scheduled Meds: . enoxaparin (LOVENOX) injection  30  mg Subcutaneous Q24H  . feeding supplement  1 Container Oral TID BM  . levothyroxine  37.5 mcg Intravenous Daily  . mouth rinse  15 mL Mouth Rinse BID  . multivitamin with minerals  1 tablet Oral Daily  . pantoprazole (PROTONIX) IV  40 mg Intravenous QHS   Continuous Infusions: . sodium chloride 10 mL/hr at 06/02/19 0935  . 0.9 % NaCl with KCl 20 mEq / L 250 mL/hr at 06/03/19 1032  . cefTRIAXone (ROCEPHIN)  IV    . metronidazole 500 mg (06/03/19 1033)     LOS: 7 days   Time spent: 29 minutes.   Darliss Cheney, MD [facility] Pager [phone]  If 7PM-7AM, please contact night-coverage [email or web address] Password [email or web address] 06/03/2019, 12:13 PM

## 2019-06-03 NOTE — Progress Notes (Signed)
Physical Therapy Treatment Patient Details Name: Mary Guzman MRN: [id number] DOB: 1921-01-16 Today's Date: 06/03/2019    History of Present Illness Pt is a 85 y.o. F with significant PMH of breast CA, hypertension, osteoporosis. Pt seen and discharged from [facility] ED 9/9 following a fall and subsequent R humerus fracture. Now presents to [facility] ED following syncopal episode. CT head negative for acute abnormality.    PT Comments    Pt is progressing slowly towards goals. All movements are slow and guarded secondary to pain. She was able to progress out of bed to recliner chair. Patient would benefit from continued skilled PT to maximize functional independence and safety with mobility. Will continue to follow acutely.     Follow Up Recommendations  SNF;Supervision/Assistance - 24 hour     Equipment Recommendations  3in1 (PT);Wheelchair (measurements PT);Wheelchair cushion (measurements PT)    Recommendations for Other Services       Precautions / Restrictions Precautions Precautions: Fall Required Braces or Orthoses: Sling Restrictions Weight Bearing Restrictions: Yes RUE Weight Bearing: Non weight bearing    Mobility  Bed Mobility Overal bed mobility: Needs Assistance Bed Mobility: Supine to Sit     Supine to sit: Max assist;HOB elevated     General bed mobility comments: Multimodal cues required to progress LEs off EOB. Max assist required to elevate trunk and progress hips forward.   Transfers Overall transfer level: Needs assistance Equipment used: None(face to face) Transfers: Sit to/from [facility] Sit to Stand: Mod assist Stand pivot transfers: Min assist       General transfer comment: mod A to power up once standing, min A for stability as pt took small shuffling steps to recliner chair.  Ambulation/Gait                 Stairs             Wheelchair Mobility    Modified Rankin (Stroke Patients Only)       Balance  Overall balance assessment: Needs assistance Sitting-balance support: Feet supported;No upper extremity supported Sitting balance-Leahy Scale: Fair     Standing balance support: Bilateral upper extremity supported Standing balance-Leahy Scale: Poor Standing balance comment: reliant on external support                            Cognition Arousal/Alertness: Awake/alert Behavior During Therapy: WFL for tasks assessed/performed Overall Cognitive Status: Within Functional Limits for tasks assessed                                 General Comments: pleasant and oriented to deficits      Exercises      General Comments General comments (skin integrity, edema, etc.): Pt daughter present and involved in session      Pertinent Vitals/Pain Pain Assessment: 0-10 Pain Score: 9  Pain Location: RUE; back Pain Descriptors / Indicators: Moaning;Guarding;Grimacing;Sore;Discomfort Pain Intervention(s): Monitored during session;Limited activity within patient's tolerance;Repositioned    Home Living                      Prior Function            PT Goals (current goals can now be found in the care plan section) Acute Rehab PT Goals Patient Stated Goal: to walk PT Goal Formulation: With patient/family Time For Goal Achievement: 06/10/19 Potential to Achieve Goals: Fair Progress  towards PT goals: Progressing toward goals    Frequency    Min 3X/week      PT Plan Current plan remains appropriate    Co-evaluation              AM-PAC PT "6 Clicks" Mobility   Outcome Measure  Help needed turning from your back to your side while in a flat bed without using bedrails?: A Little Help needed moving from lying on your back to sitting on the side of a flat bed without using bedrails?: A Lot Help needed moving to and from a bed to a chair (including a wheelchair)?: A Lot Help needed standing up from a chair using your arms (e.g., wheelchair or  bedside chair)?: A Lot Help needed to walk in hospital room?: A Lot Help needed climbing 3-5 steps with a railing? : A Lot 6 Click Score: 13    End of Session Equipment Utilized During Treatment: Gait belt;Other (comment)(sling) Activity Tolerance: Patient tolerated treatment well Patient left: in chair;with call bell/phone within reach;with chair alarm set;with nursing/sitter in room;with family/visitor present Nurse Communication: Mobility status PT Visit Diagnosis: Unsteadiness on feet (R26.81);Pain;Difficulty in walking, not elsewhere classified (R26.2) Pain - Right/Left: Right Pain - part of body: Shoulder     Time: 8003-4917 PT Time Calculation (min) (ACUTE ONLY): 19 min  Charges:  $Therapeutic Activity: 8-22 mins                    Benjiman Core, [place] Pager [phone] Acute Rehab  Allena Katz 06/03/2019, 12:19 PM

## 2019-06-03 NOTE — TOC Progression Note (Signed)
Transition of Care [facility]) - Progression Note    Patient Details  Name: Mary Guzman MRN: [id number] Date of Birth: 04/21/1921  [facility] of [facility] CM/SW [place], [place] Phone Number: 06/03/2019, 2:05 PM  Clinical Narrative:    Pt has been worked up for SNF- her daughter prefers SNF at [facility] or [facility] due to proximity. A DNR is signed on her chart. Await medical stability and will keep [facility] updated as pt progresses. She will need auth by [employer], will initiate tomorrow should she tolerate and be able to advance her diet.    Expected Discharge Plan: Skilled Nursing Facility Barriers to Discharge: [place] (PASRR), Insurance Authorization, Continued Medical Work up  Expected Discharge Plan and Services Expected Discharge Plan: [place] In-house Referral: Clinical Social Work Discharge Planning Services: CM Consult Living arrangements for the past 2 months: [place]      Social Determinants of Health (SDOH) Interventions    Readmission Risk Interventions No flowsheet data found.

## 2019-06-03 NOTE — Plan of Care (Signed)
  Problem: Education: Goal: Knowledge of General Education information will improve Description: Including pain rating scale, medication(s)/side effects and non-pharmacologic comfort measures Outcome: Progressing   Problem: Nutrition: Goal: Adequate nutrition will be maintained Outcome: Progressing   

## 2019-06-03 NOTE — Progress Notes (Addendum)
Initial Nutrition Assessment  RD working remotely.  DOCUMENTATION CODES:   Not applicable  INTERVENTION:   -Boost Breeze po TID, each supplement provides 250 kcal and 9 grams of protein -MVI with minerals daily -RD will follow for diet advancement and adjust supplement regimen as appropriate -If prolonged NPO/ clear liquid diet status is anticipated, consider initiation of nutrition support  NUTRITION DIAGNOSIS:   Increased nutrient needs related to post-op healing as evidenced by estimated needs.  GOAL:   Patient will meet greater than or equal to 90% of their needs  MONITOR:   PO intake, Supplement acceptance, Diet advancement, Labs, Weight trends, Skin, I & O's  REASON FOR ASSESSMENT:   Consult Assessment of nutrition requirement/status  ASSESSMENT:   BODHI STENGLEIN is a 85 y.o. female with medical history significant of hypertension and hypothyroidism.  Patient reports 2 to 3-day history of lower abdominal pain, moderate in intensity, dull to colicky in nature, no improving or worsening factors, associated with chills but no frank fevers.  No nausea, no vomiting and no diarrhea.  Pt admitted with acute appendicitis.   9/13- CT scan reviewed ill-defined appendix containing a cluster of small stones vs larger stone 9/16- s/p ex lap  Reviewed I/O's: +585 ml x 24 hours and +5.5 L since admission  UOP: 630 ml x 24 hours  Drains: 585 ml x 24 hours   Pt orthopedics notes, pt with rt humerus fx with sling.   Spoke with pt daughter [name]), who is very involved in pt care. She reports that pt was fairly independent PTA (ambulates with walker at baseline); pt stayed home alone at night and daughter would visit daily during the day. Daughter denies any changes in eating patterns; pt would typically consume a large breakfast and lunch, but would have minimal appetite at night (typical intake would consist of Breakfast: cereal and fruit OR egg and sausage biscuit; lunch:  meat, starch, and vegetable; Dinner: piece of toast). Daughter also reports pt would consume 1 Ensure supplement daily or every other day.   Pt UBW is around 125-130#; pt weighs herself daily PTA. Pt daughter denies any weight loss, but reports weight gain during hospitalization due to fluid status (pt +5.5 L since admission).   Pt tolerating clear liquids well. She consumed 1/3 cup of chicken broth yesterday evening. Daughter reports she will assist pt with consuming breakfast tray. Discussed rationale for clear liquid diet and potential for diet advancement. Educated on importance of good meal and supplement intake to promote healing. Daughter amenable to Boost Breeze supplements with clear liquid diet and requesting chocolate Ensure shakes with diet advancement.   Per CSW notes, likely discharge to SNF once medically stable.   Albumin has a half-life of 21 days and is strongly affected by stress response and inflammatory process, therefore, do not expect to see an improvement in this lab value during acute hospitalization. When a patient presents with low albumin, it is likely skewed due to the acute inflammatory response.  Unless it is suspected that patient had poor PO intake or malnutrition prior to admission, then RD should not be consulted solely for low albumin. Note that low albumin is no longer used to diagnose malnutrition; [place] uses the new malnutrition guidelines published by the American Society for Parenteral and Enteral Nutrition (A.S.P.E.N.) and the Academy of Nutrition and Dietetics (AND).    Medications reviewed and include 0.9% NaCl with KCl mEq/L infusion @ 250 ml/hr.   Labs reviewed.   Diet Order:  Diet Order            Diet clear liquid Room service appropriate? Yes; Fluid consistency: Thin  Diet effective now              EDUCATION NEEDS:   Education needs have been addressed  Skin:  Skin Assessment: Reviewed RN Assessment  Last BM:  06/01/19  Height:    Ht Readings from Last 1 Encounters:  06/02/19 5' 4.02" (1.626 m)    Weight:   Wt Readings from Last 1 Encounters:  06/03/19 70.3 kg    Ideal Body Weight:  54.5 kg  BMI:  Body mass index is 26.59 kg/m.  Estimated Nutritional Needs:   Kcal:  1550-1750  Protein:  70-85 grams  Fluid:  > 1.5 L    Rashaud Ybarbo A. Jimmye Norman, RD, LDN, [place] Registered Dietitian II Certified Diabetes Care and Education Specialist Pager: [phone] After hours Pager: [phone]

## 2019-06-03 NOTE — Progress Notes (Addendum)
1 Day Post-Op    CC:  Subjective: She appears pretty frail and weak this AM.  She is sore all over.  Her urine output is low, her weight is up.  Creatinine is up, also. She is less tender over her abdomen, what I would expect post laparoscopy, drainage is serosanguinous.  She is barely getting IS up to 500, 1/2 of efforts, and I need to hold her IS for her.  No flatus or BM so far.   Objective: Vital signs in last 24 hours: Temp:  [97.7 F (36.5 C)-98.3 F (36.8 C)] 97.8 F (36.6 C) (09/17 0500) Pulse Rate:  [57-84] 57 (09/17 0500) Resp:  [12-21] 18 (09/17 0500) BP: (92-116)/(41-61) 104/56 (09/17 0500) SpO2:  [89 %-99 %] 94 % (09/17 0500) Weight:  [70.3 kg] 70.3 kg (09/17 0500) Last BM Date: 06/01/19 340 PO 1500 IV 585 urine No BM Afebrile, VSS, BP down some Creatinine up to 3.49 Wt is up 57.3Kg (9/13) >> 70.3(9/17)  Intake/Output from previous day: 09/16 0701 - 09/17 0700 In: 1825 [P.O.:340; I.V.:1285; IV Piggyback:200] Out: 1240 [Urine:630; Drains:585; Blood:25] Intake/Output this shift: No intake/output data recorded.  General appearance: alert, cooperative and tired and just sore all over.   Resp: clear to auscultation bilaterally and anterior GI: Soft, sore, port sites look good.  She has some tenderness but it is what she would expect.  Her JP drain is serosanguineous.  No flatus or BM currently. Extremities: She has some edema in the lower extremities.  +1  Lab Results:  Recent Labs    06/02/19 0130 06/03/19 0632  WBC 22.0* 18.7*  HGB 8.4* 8.4*  HCT 26.9* 25.9*  PLT 305 300    BMET Recent Labs    06/02/19 0130 06/03/19 0632  NA 138 139  K 3.5 4.2  CL 108 109  CO2 19* 20*  GLUCOSE 102* 113*  BUN 55* 58*  CREATININE 3.05* 3.49*  CALCIUM 7.7* 7.4*   PT/INR No results for input(s): LABPROT, INR in the last 72 hours.  Recent Labs  Lab 05/27/19 1246 06/03/19 0632  AST 38 16  ALT 25 9  ALKPHOS 228* 169*  BILITOT 0.4 0.5  PROT 6.1* 5.0*   ALBUMIN 2.7* 1.3*     Lipase  No results found for: LIPASE   Medications: . enoxaparin (LOVENOX) injection  30 mg Subcutaneous Q24H  . levothyroxine  37.5 mcg Intravenous Daily  . mouth rinse  15 mL Mouth Rinse BID  . pantoprazole (PROTONIX) IV  40 mg Intravenous QHS   . sodium chloride 10 mL/hr at 06/02/19 0935  . dextrose 5% lactated ringers with KCl 20 mEq/L 75 mL/hr at 06/03/19 0407  . piperacillin-tazobactam (ZOSYN)  IV 2.25 g (06/03/19 0040)    Assessment/Plan Syncope AKI with CKD stage III - Creatinine 3.91 >>4.09(8/11)>>9.14(7/82)>>9.56(2/13)>>0.86(5/78)>>4.69(6/29) Chronic systolic heart failure/mild troponin elevation  57.3Kg (9/13) >> 70.3(9/17)  Hypertension Hypothyroid Hx breast cancer Fall with right proximal humeral fracture To see Dr. 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 Alphonsa Overall - Leukocytosis WBC11.7(9/10)>>13.5>>12.6(9/11)>>11.5(9/12)>>12.1(9/13)>>12.2(9/14)>>13.8(9/15)>>     22.0(9/16)>>18.7(9/17)  FEN:IV fluids/ clear liquids ID: Flagyl/Cipro 9/10 x 1 dose;Zosyn 9/10 >>day6 DVT: Lovenox Follow up: TBD POC: [name],[name] Daughter [phone]  [phone]  Mary Guzman, Mary Guzman Son [phone]     Plan: I am going to leave her on clear liquids for now.  Nutrition consult has been placed.  I have asked Dr.Pahwani to see early, and he is already changing fluid orders.  I will leave the foley in.  Her last Echo was 07/2017, EF was 55% with grade 1 diastolic dysfunction.     LOS: 7 days   JENNINGS,WILLARD 06/03/2019 [phone]  Agree with  above. Looks better this afternoon.  Sitting in chair. She was able to pull 750 cc on IS.  Her daughter is in the room with her.  Alphonsa Overall, MD, [facility] Pager: [phone] Office phone:  [phone]

## 2019-06-03 NOTE — Progress Notes (Signed)
Occupational Therapy Treatment Patient Details Name: Mary Guzman MRN: [id number] DOB: 1921-08-02 Today's Date: 06/03/2019    History of present illness Pt is a 85 y.o. F with significant PMH of breast CA, hypertension, osteoporosis. Pt seen and discharged from [facility] ED 9/9 following a fall and subsequent R humerus fracture. Now presents to [facility] ED following syncopal episode. CT head negative for acute abnormality.   OT comments  Pt making steady progress towards OT goals this session. Pt requesting to go back to bed upon OT arrival as pt has been sitting in recliner most of the afternoon . Assisted pt with functional transfer from recliner>EOB. MOD A face-to-face stand pivot transfer; MAX A for sit>supine in bed. DC Plan remains appropriate. Will continue to follow for acute OT needs.    Follow Up Recommendations       Equipment Recommendations  Other (comment)(tbd to next venue)    Recommendations for Other Services      Precautions / Restrictions Precautions Precautions: Fall Required Braces or Orthoses: Sling Restrictions Weight Bearing Restrictions: Yes RUE Weight Bearing: Non weight bearing       Mobility Bed Mobility Overal bed mobility: Needs Assistance Bed Mobility: Sit to Supine       Sit to supine: Max assist;HOB elevated   General bed mobility comments: Multimodal cues required to initiate lowering shoulders to bed; HOB elevated as mush a possible; MAX A to lift BLEs onto bed  Transfers Overall transfer level: Needs assistance Equipment used: None Transfers: Sit to/from [facility] Sit to Stand: Mod assist Stand pivot transfers: Mod assist       General transfer comment: mod A to power up once standing, MOD A to initiate pivotal steps from recliner>EOB; small shuffling steps; decreased balance when standing    Balance Overall balance assessment: Needs assistance Sitting-balance support: Feet supported;No upper extremity supported Sitting  balance-Leahy Scale: Fair Sitting balance - Comments: min guard while sitting up right in recliner   Standing balance support: Single extremity supported Standing balance-Leahy Scale: Poor Standing balance comment: reliant on external support                           ADL either performed or assessed with clinical judgement   ADL                             Toilet Transfer Details (indicate cue type and reason): simulated toilet transfer; face-to-face transfer; MOD A to power up; responds better to face- to- face rather than hand held; verbal cues to initiate pivot steps back to bed         Functional mobility during ADLs: Moderate assistance General ADL Comments: session focus on funcitonal transfers from recliner> EOB; pt with pain in abdomen; deferred further ADLs     Vision Patient Visual Report: No change from baseline     Perception     Praxis      Cognition Arousal/Alertness: Awake/alert Behavior During Therapy: WFL for tasks assessed/performed Overall Cognitive Status: Within Functional Limits for tasks assessed                                 General Comments: pleasant and oriented to deficits        Exercises     Shoulder Instructions       General Comments pt daughter  present and involved in session;pt and daughter very appreciative and pleasant    Pertinent Vitals/ Pain       Faces Pain Scale: Hurts even more Pain Location: navel Pain Descriptors / Indicators: Moaning;Guarding;Grimacing;Sore;Discomfort Pain Intervention(s): Limited activity within patient's tolerance;Monitored during session;Repositioned  Home Living                                          Prior Functioning/Environment              Frequency  Min 2X/week        Progress Toward Goals  OT Goals(current goals can now be found in the care plan section)     Acute Rehab OT Goals Time For Goal Achievement: 06/11/19   Plan      Co-evaluation                 AM-PAC OT "6 Clicks" Daily Activity     Outcome Measure   Help from another person eating meals?: A Little Help from another person taking care of personal grooming?: A Little Help from another person toileting, which includes using toliet, bedpan, or urinal?: A Lot Help from another person bathing (including washing, rinsing, drying)?: A Lot Help from another person to put on and taking off regular upper body clothing?: A Lot Help from another person to put on and taking off regular lower body clothing?: A Lot 6 Click Score: 14    End of Session    OT Visit Diagnosis: Other abnormalities of gait and mobility (R26.89);Muscle weakness (generalized) (M62.81);History of falling (Z91.81);Pain   Activity Tolerance Patient tolerated treatment well   Patient Left with call bell/phone within reach;in bed   Nurse Communication Mobility status        Time: 5726-2035 OT Time Calculation (min): 19 min  Charges: OT General Charges $OT Visit: 1 Visit OT Treatments $Therapeutic Activity: 8-22 mins [place], [place] [phone] [place] 06/03/2019, 4:34 PM

## 2019-06-03 NOTE — Care Management Important Message (Signed)
Important Message  Patient Details  Name: MANASI DISHON MRN: [id number] Date of Birth: [birth year]   Medicare Important Message Given:  Yes     Shelda Altes 06/03/2019, 4:05 PM

## 2019-06-04 ENCOUNTER — Inpatient Hospital Stay (HOSPITAL_COMMUNITY): Payer: PPO

## 2019-06-04 LAB — BASIC METABOLIC PANEL
Anion gap: 9 (ref 5–15)
BUN: 54 mg/dL — ABNORMAL HIGH (ref 8–23)
CO2: 15 mmol/L — ABNORMAL LOW (ref 22–32)
Calcium: 6.7 mg/dL — ABNORMAL LOW (ref 8.9–10.3)
Chloride: 116 mmol/L — ABNORMAL HIGH (ref 98–111)
Creatinine, Ser: 3.19 mg/dL — ABNORMAL HIGH (ref 0.44–1.00)
GFR calc Af Amer: 14 mL/min — ABNORMAL LOW (ref >60)
GFR calc non Af Amer: 12 mL/min — ABNORMAL LOW (ref >60)
Glucose, Bld: 81 mg/dL (ref 70–99)
Potassium: 5.1 mmol/L (ref 3.5–5.1)
Sodium: 140 mmol/L (ref 135–145)

## 2019-06-04 LAB — CBC
HCT: 24.2 % — ABNORMAL LOW (ref 36.0–46.0)
Hemoglobin: 7.7 g/dL — ABNORMAL LOW (ref 12.0–15.0)
MCH: 28.9 pg (ref 26.0–34.0)
MCHC: 31.8 g/dL (ref 30.0–36.0)
MCV: 91 fL (ref 80.0–100.0)
Platelets: 319 10*3/uL (ref 150–400)
RBC: 2.66 MIL/uL — ABNORMAL LOW (ref 3.87–5.11)
RDW: 17.9 % — ABNORMAL HIGH (ref 11.5–15.5)
WBC: 15.9 10*3/uL — ABNORMAL HIGH (ref 4.0–10.5)
nRBC: 0 % (ref 0.0–0.2)

## 2019-06-04 MED ORDER — FUROSEMIDE 10 MG/ML IJ SOLN
40.0000 mg | Freq: Once | INTRAMUSCULAR | Status: AC
  Administered 2019-06-04: 12:00:00 40 mg via INTRAVENOUS
  Filled 2019-06-04: qty 4

## 2019-06-04 NOTE — Progress Notes (Signed)
PROGRESS NOTE    Mary Guzman  YTK:[id number] DOB: 09/08/1921 DOA: 05/27/2019 PCP: [name], NP    Brief Narrative:  85 year old lady with prior history of hypertension hypothyroidism presented to ED on 9/10/20204 lower abdominal pain and right arm pain.  CT of the abdomen and pelvis showed Acute appendicitis with multiple appendicoliths and a small focal, contained perforation without abscess.  24 hours prior to the arrival to ED on 9/10, pt had a syncopal episode and fell on her right arm.  X-rays of the right arm shows Acute impacted and slightly angulated fracture involving the right humeral neck. Orthopedics consulted and recommended NWB in sling and follow up with Dr [name] in the office in 2 to 3 weeks.   Surgery consulted for the acute appendicitis and recommended non operative treatment with IV antibiotics and if she does not improve she might need surgery.  Since she has persistent leukocytosis she underwent a repeat  CT abdomen and pelvis showing Ill-defined appendix containing a cluster of small stones versus larger stone measuring 13 x 7 mm, The tiny linear focus of extraluminal gas identified on the previous study is stable. No evidence for organized fluid collection on the current study to suggest abscess.  She had dropped her blood pressure and her leukocytosis got worse on the morning of 06/02/2019 so she was taken to the OR by surgery and underwent laparoscopic appendectomy.     Assessment & Plan:   Principal Problem:   Appendicitis Active Problems:   Hypothyroidism   Essential hypertension   GERD   Osteoarthritis   Chronic systolic CHF (congestive heart failure) (HCC)   CKD (chronic kidney disease), stage III (HCC)   Right humeral fracture   Acute appendicitis with contained perforation with multiple appendicoliths: Initially surgery recommended treating with IV antibiotics however she dropped her blood pressure today and her leukocytosis got worse so she was  emergently taken to the OR underwent laparoscopic cholecystectomy by general surgery today.  We will continue antibiotics for now.  She is doing much better postoperatively.  Further management per general surgery.  Right humeral fracture: Secondary to a fall.  Right shoulder sling was placed and nonweightbearing bearing recommended by orthopedics.  Outpatient follow-up with Dr. [name] on discharge. Continue with pain control for now and nonweightbearing on the right side. Physical therapy evaluation recommending SNF.  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  Syncope: Probably secondary to orthostatic versus vasovagal from acute appendicitis. PT OT to see her.  Acute on stage III CKD: Baseline creatinine around 2.5 and creatinine has worsened with a peak 3.3.  Improved today however she continues to have low urine output despite of receiving aggressive IV fluids for last 1 day so at this point in time I will give her a diuretic challenge and give her a dose of Lasix 40 mg IV.    Chronic diastolic heart failure: No signs of fluid overload. Last echocardiogram from 2018 showedWall thickness was increased in a pattern of moderate LVH. The estimated ejection  fraction was 55%. Wall motion was normal; there were no regional   wall motion abnormalities. Septal-lateral dyssynchrony. Doppler  parameters are consistent with abnormal left ventricular relaxation (grade 1 diastolic dysfunction).   Hypothyroidism: Continue Synthroid.  Anemia of chronic disease: Hemoglobin is stable for most part.  Continue to watch daily.  Elevated troponins probably from demand ischemia from acute appendicitis.  GERD stable  Hypertension: Blood pressure controlled and better than yesterday.  Continue to monitor.  DVT prophylaxis: Lovenox Code  Status: DNR Family Communication: Discussed with son and daughter at bedside on 06/02/2019.  No family member present at bedside today. Disposition Plan: TBD   Consultants:   General surgery.    Procedures: Laparoscopic cholecystectomy on 06/02/2019  Antimicrobials: zosyn since admission.   Subjective: Patient seen and examined.  She has no complaints.  Denied any shortness of breath.  alert and oriented.   Objective: Vitals:   06/03/19 2212 06/04/19 0414 06/04/19 0500 06/04/19 1056  BP: (!) 108/42 (!) 110/38  99/72  Pulse: 73 69  68  Resp: 16 17  17   Temp: 98.5 F (36.9 C) 97.8 F (36.6 C)  99 F (37.2 C)  TempSrc: Oral Oral  Oral  SpO2: 100% 96%  97%  Weight:   70.3 kg   Height:        Intake/Output Summary (Last 24 hours) at 06/04/2019 1120 Last data filed at 06/04/2019 0500 Gross per 24 hour  Intake 3009.4 ml  Output 495 ml  Net 2514.4 ml   Filed Weights   06/02/19 0500 06/03/19 0500 06/04/19 0500  Weight: 68.7 kg 70.3 kg 70.3 kg    Examination:  General exam: Appears calm and comfortable  Respiratory system: Decreased breath sounds at the bases. Respiratory effort normal. Cardiovascular system: S1 & S2 heard, RRR. No JVD, murmurs, rubs, gallops or clicks. No pedal edema. Gastrointestinal system: Abdomen is nondistended, soft and mild generalized tenderness. No organomegaly or masses felt. Normal bowel sounds heard. Central nervous system: Alert and oriented. No focal neurological deficits. Extremities: Symmetric 5 x 5 power. Skin: No rashes, lesions or ulcers.  Psychiatry: Judgement and insight appear normal. Mood & affect appropriate.    Data Reviewed: I have personally reviewed following labs and imaging studies  CBC: Recent Labs  Lab 05/31/19 0416 06/01/19 0100 06/02/19 0130 06/03/19 0632 06/04/19 0700  WBC 12.2* 13.8* 22.0* 18.7* 15.9*  HGB 8.3* 9.0* 8.4* 8.4* 7.7*  HCT 26.4* 27.8* 26.9* 25.9* 24.2*  MCV 88.3 88.5 87.6 89.0 91.0  PLT 259 264 305 300 170   Basic Metabolic Panel: Recent Labs  Lab 05/31/19 0416 06/01/19 0100 06/02/19 0130 06/03/19 0632 06/04/19 0700  NA 136 139 138 139 140  K 3.6 4.0 3.5 4.2 5.1  CL 107 108 108  109 116*  CO2 19* 21* 19* 20* 15*  GLUCOSE 114* 122* 102* 113* 81  BUN 64* 60* 55* 58* 54*  CREATININE 3.16* 3.04* 3.05* 3.49* 3.19*  CALCIUM 7.6* 7.8* 7.7* 7.4* 6.7*   GFR: Estimated Creatinine Clearance: 9.9 mL/min (A) (by C-G formula based on SCr of 3.19 mg/dL (H)). Liver Function Tests: Recent Labs  Lab 06/03/19 0632  AST 16  ALT 9  ALKPHOS 169*  BILITOT 0.5  PROT 5.0*  ALBUMIN 1.3*   No results for input(s): LIPASE, AMYLASE in the last 168 hours. No results for input(s): AMMONIA in the last 168 hours. Coagulation Profile: No results for input(s): INR, PROTIME in the last 168 hours. Cardiac Enzymes: No results for input(s): CKTOTAL, CKMB, CKMBINDEX, TROPONINI in the last 168 hours. BNP (last 3 results) No results for input(s): PROBNP in the last 8760 hours. HbA1C: No results for input(s): HGBA1C in the last 72 hours. CBG: No results for input(s): GLUCAP in the last 168 hours. Lipid Profile: No results for input(s): CHOL, HDL, LDLCALC, TRIG, CHOLHDL, LDLDIRECT in the last 72 hours. Thyroid Function Tests: No results for input(s): TSH, T4TOTAL, FREET4, T3FREE, THYROIDAB in the last 72 hours. Anemia Panel: No results for input(s): VITAMINB12,  FOLATE, FERRITIN, TIBC, IRON, RETICCTPCT in the last 72 hours. Sepsis Labs: No results for input(s): PROCALCITON, LATICACIDVEN in the last 168 hours.  Recent Results (from the past 240 hour(s))  SARS Coronavirus 2 [facility] order, Performed in [facility] hospital lab) Nasopharyngeal Nasopharyngeal Swab     Status: None   Collection Time: 05/27/19  6:26 PM   Specimen: Nasopharyngeal Swab  Result Value Ref Range Status   SARS Coronavirus 2 NEGATIVE NEGATIVE Final    Comment: (NOTE) If result is NEGATIVE SARS-CoV-2 target nucleic acids are NOT DETECTED. The SARS-CoV-2 RNA is generally detectable in upper and lower  respiratory specimens during the acute phase of infection. The lowest  concentration of SARS-CoV-2 viral copies  this assay can detect is 250  copies / mL. A negative result does not preclude SARS-CoV-2 infection  and should not be used as the sole basis for treatment or other  patient management decisions.  A negative result may occur with  improper specimen collection / handling, submission of specimen other  than nasopharyngeal swab, presence of viral mutation(s) within the  areas targeted by this assay, and inadequate number of viral copies  (<250 copies / mL). A negative result must be combined with clinical  observations, patient history, and epidemiological information. If result is POSITIVE SARS-CoV-2 target nucleic acids are DETECTED. The SARS-CoV-2 RNA is generally detectable in upper and lower  respiratory specimens dur ing the acute phase of infection.  Positive  results are indicative of active infection with SARS-CoV-2.  Clinical  correlation with patient history and other diagnostic information is  necessary to determine patient infection status.  Positive results do  not rule out bacterial infection or co-infection with other viruses. If result is PRESUMPTIVE POSTIVE SARS-CoV-2 nucleic acids MAY BE PRESENT.   A presumptive positive result was obtained on the submitted specimen  and confirmed on repeat testing.  While 2019 novel coronavirus  (SARS-CoV-2) nucleic acids may be present in the submitted sample  additional confirmatory testing may be necessary for epidemiological  and / or clinical management purposes  to differentiate between  SARS-CoV-2 and other Sarbecovirus currently known to infect humans.  If clinically indicated additional testing with an alternate test  methodology [phone]) is advised. The SARS-CoV-2 RNA is generally  detectable in upper and lower respiratory sp ecimens during the acute  phase of infection. The expected result is Negative. Fact Sheet for Patients:  StrictlyIdeas.no Fact Sheet for Healthcare Providers:  BankingDealers.co.za This test is not yet approved or cleared by the [place] FDA and has been authorized for detection and/or diagnosis of SARS-CoV-2 by FDA under an Emergency Use Authorization (EUA).  This EUA will remain in effect (meaning this test can be used) for the duration of the COVID-19 declaration under Section 564(b)(1) of the Act, 21 U.S.C. section 360bbb-3(b)(1), unless the authorization is terminated or revoked sooner. Performed at [facility], [place] [address], [place], [place] [address]   Surgical pcr screen     Status: None   Collection Time: 06/01/19 10:02 PM   Specimen: Nasal Mucosa; Nasal Swab  Result Value Ref Range Status   MRSA, PCR NEGATIVE NEGATIVE Final   Staphylococcus aureus NEGATIVE NEGATIVE Final    Comment: (NOTE) The Xpert SA Assay (FDA approved for NASAL specimens in patients 28 years of age and older), is one component of a comprehensive surveillance program. It is not intended to diagnose infection nor to guide or monitor treatment. Performed at [facility], [place]  [address] [place], [place] [address]     Radiology Studies: Dg Chest Port 1 View  Result Date: 06/04/2019 CLINICAL DATA:  Pulmonary edema. EXAM: PORTABLE CHEST 1 VIEW COMPARISON:  Radiograph of May 27, 2019. FINDINGS: Stable cardiomediastinal silhouette. Atherosclerosis of thoracic aorta is noted. No pneumothorax is noted. Minimal bibasilar subsegmental atelectasis is noted. Small left pleural effusion is noted. Stable elevated right hemidiaphragm is noted. Bony thorax is unremarkable. IMPRESSION: Minimal bibasilar subsegmental atelectasis. Small left pleural effusion. Aortic Atherosclerosis (ICD10-I70.0). Electronically Signed   By: Marijo Conception M.D.   On: 06/04/2019 09:58    Scheduled Meds: . enoxaparin (LOVENOX) injection  30 mg Subcutaneous Q24H  . feeding supplement  1 Container Oral TID BM  . furosemide  40 mg Intravenous Once  .  levothyroxine  37.5 mcg Intravenous Daily  . mouth rinse  15 mL Mouth Rinse BID  . multivitamin with minerals  1 tablet Oral Daily  . pantoprazole (PROTONIX) IV  40 mg Intravenous QHS   Continuous Infusions: . sodium chloride 10 mL/hr at 06/02/19 0935  . cefTRIAXone (ROCEPHIN)  IV Stopped (06/03/19 1425)  . metronidazole 500 mg (06/04/19 1024)     LOS: 8 days   Time spent: 30 minutes.   Darliss Cheney, MD [facility] Pager [phone]  If 7PM-7AM, please contact night-coverage [email or web address] Password [facility] 06/04/2019, 11:20 AM

## 2019-06-04 NOTE — Progress Notes (Signed)
Pt's not having enough urine output. Endorsed to Teacher, music. Notified MD. New orders received.

## 2019-06-04 NOTE — Progress Notes (Addendum)
2 Days Post-Op    CC: Abdominal pain  Subjective: She is sore all over.  Back and stomach.  Tolerating clears well.  No flatus so far.  Port sites all look good.  Drainage from the JP is clear and serous.  Objective: Vital signs in last 24 hours: Temp:  [97.4 F (36.3 C)-98.5 F (36.9 C)] 97.8 F (36.6 C) (09/18 0414) Pulse Rate:  [62-73] 69 (09/18 0414) Resp:  [16-19] 17 (09/18 0414) BP: (85-110)/(38-73) 110/38 (09/18 0414) SpO2:  [82 %-100 %] 96 % (09/18 0414) Weight:  [70.3 kg] 70.3 kg (09/18 0500) Last BM Date: 06/01/19 120 p.o. recorded 2800 IV recorded 125 urine recorded - nursing staff reports this is correct 270 from the drain recorded Afebrile blood pressures up some after additional fluids. O2 sats 96% on 2 L nasal cannula Weight stable yesterday at 70.3 kg Labs pending   Intake/Output from previous day: 09/17 0701 - 09/18 0700 In: 3009.4 [P.O.:120; I.V.:2500; IV Piggyback:389.4] Out: 495 [Urine:225; Drains:270] Intake/Output this shift: No intake/output data recorded.  General appearance: alert, cooperative and no distress Resp: clear to auscultation bilaterally and Anterior, it takes 2 people to get her up to listen to her back. GI: Soft, sore, port sites look fine.  Lab Results:  Recent Labs    06/03/19 0632 06/04/19 0700  WBC 18.7* 15.9*  HGB 8.4* 7.7*  HCT 25.9* 24.2*  PLT 300 319    BMET Recent Labs    06/03/19 0632 06/04/19 0700  NA 139 140  K 4.2 5.1  CL 109 116*  CO2 20* 15*  GLUCOSE 113* 81  BUN 58* 54*  CREATININE 3.49* 3.19*  CALCIUM 7.4* 6.7*   PT/INR No results for input(s): LABPROT, INR in the last 72 hours.  Recent Labs  Lab 06/03/19 0632  AST 16  ALT 9  ALKPHOS 169*  BILITOT 0.5  PROT 5.0*  ALBUMIN 1.3*     Lipase  No results found for: LIPASE   Medications: . enoxaparin (LOVENOX) injection  30 mg Subcutaneous Q24H  . feeding supplement  1 Container Oral TID BM  . levothyroxine  37.5 mcg Intravenous Daily   . mouth rinse  15 mL Mouth Rinse BID  . multivitamin with minerals  1 tablet Oral Daily  . pantoprazole (PROTONIX) IV  40 mg Intravenous QHS   . sodium chloride 10 mL/hr at 06/02/19 0935  . cefTRIAXone (ROCEPHIN)  IV Stopped (06/03/19 1425)  . metronidazole Stopped (06/04/19 0235)    Assessment/Plan Syncope AKI with CKD stage III - Creatinine 3.91 >>8.11(9/14)>>7.82(9/56)>>2.13(0/86)>>5.78(4/69)>>6.29(5/28) Chronic systolic heart failure/mild troponin elevation  57.3Kg (9/13) >> 70.3(9/17)  Hypertension Hypothyroid Hx breast cancer Fall with right proximal humeral fracture To see Dr. 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 Alphonsa Overall - Leukocytosis WBC11.7(9/10)>>13.5>>12.6(9/11)>>11.5(9/12)>>12.1(9/13)>>12.2(9/14)>>13.8(9/15)>>     22.0(9/16)>>18.7(9/17)  FEN:IV fluids/ clear liquids ID: Flagyl/Cipro 9/10 x 1 dose;Zosyn 9/10 >>day6 DVT: Lovenox Follow up: TBD POC: Plourde,[name] Daughter [phone]  [phone]  SANAIYAH, KIRCHHOFF [phone]     Plan: She is doing well from her surgery the biggest issue right now seems to be her urine output.  Her IV fluids currently at 250/h.    I will reach out to Dr.Pahwani and let  him know.  I have ordered some stat labs for this morning.  WBC down to 15.9.  Hemoglobin 7.7, hematocrit 24.2.  Creatinine down to 3.19 (Creatinine peaked at 3.35 - 9/12)    LOS: 8 days    JENNINGS,WILLARD 06/04/2019 [phone]  Agree with above.  This will probably be a slow recovery.  In that her WBC is decreasing, this is  promising. Daughter, [name], in room with patient.  Alphonsa Overall, MD, [facility] Pager: [phone] Office phone:  [phone]

## 2019-06-04 NOTE — Progress Notes (Signed)
Physical Therapy Treatment Patient Details Name: Mary Guzman MRN: [id number] DOB: May 01, 1921 Today's Date: 06/04/2019    History of Present Illness Pt is a 85 y.o. F with significant PMH of breast CA, hypertension, osteoporosis. Pt seen and discharged from [facility] ED 9/9 following a fall and subsequent R humerus fracture. Now presents to [facility] ED following syncopal episode. CT head negative for acute abnormality.  Pt s/p laprascopic appendectomy (06/04/19)    PT Comments    Pt needing a bit more help today with sit > stand. She gives forth good effort throughout session.  Supportive daughter.  Con't to recommend short term SNF rehab.   Follow Up Recommendations  SNF;Supervision/Assistance - 24 hour     Equipment Recommendations  3in1 (PT);Wheelchair (measurements PT);Wheelchair cushion (measurements PT)    Recommendations for Other Services       Precautions / Restrictions Precautions Required Braces or Orthoses: Sling Restrictions Weight Bearing Restrictions: Yes RUE Weight Bearing: Non weight bearing    Mobility  Bed Mobility Overal bed mobility: Needs Assistance Bed Mobility: Supine to Sit     Supine to sit: Max assist;HOB elevated     General bed mobility comments: Pt gives forth good effort, but requires MAX effort and use of bed pad to get fully to EOB  Transfers Overall transfer level: Needs assistance Equipment used: 1 person hand held assist Transfers: Sit to/from Stand Sit to Stand: Max assist;From elevated surface         General transfer comment: MAX A today to power up to standing and use of rocking/momentum. Once in standing, she had difficulty getting her COG over her BOS with a posterior lean.  Noted to have some leaksge from JP drain insertion site and sat back down.    Ambulation/Gait                 Stairs             Wheelchair Mobility    Modified Rankin (Stroke Patients Only)       Balance   Sitting-balance support: Feet  supported Sitting balance-Leahy Scale: Fair     Standing balance support: Single extremity supported Standing balance-Leahy Scale: Poor Standing balance comment: requires support                            Cognition Arousal/Alertness: Awake/alert Behavior During Therapy: WFL for tasks assessed/performed Overall Cognitive Status: Within Functional Limits for tasks assessed                                        Exercises      General Comments        Pertinent Vitals/Pain Pain Assessment: Faces Faces Pain Scale: Hurts even more Pain Location: R arm and abdomen Pain Descriptors / Indicators: Grimacing;Sore Pain Intervention(s): Limited activity within patient's tolerance;Monitored during session;Repositioned    Home Living                      Prior Function            PT Goals (current goals can now be found in the care plan section) Acute Rehab PT Goals Patient Stated Goal: to walk Progress towards PT goals: Progressing toward goals    Frequency    Min 2X/week      PT Plan Current plan remains appropriate;Frequency needs  to be updated    Co-evaluation              AM-PAC PT "6 Clicks" Mobility   Outcome Measure  Help needed turning from your back to your side while in a flat bed without using bedrails?: A Little Help needed moving from lying on your back to sitting on the side of a flat bed without using bedrails?: A Lot Help needed moving to and from a bed to a chair (including a wheelchair)?: A Lot Help needed standing up from a chair using your arms (e.g., wheelchair or bedside chair)?: A Lot Help needed to walk in hospital room?: A Lot Help needed climbing 3-5 steps with a railing? : Total 6 Click Score: 12    End of Session Equipment Utilized During Treatment: Gait belt;Other (comment)(sling) Activity Tolerance: Patient limited by fatigue Patient left: in bed;with bed alarm set;with call bell/phone within  reach;with family/visitor present Nurse Communication: Mobility status PT Visit Diagnosis: Unsteadiness on feet (R26.81);Pain;Difficulty in walking, not elsewhere classified (R26.2) Pain - Right/Left: Right Pain - part of body: Shoulder     Time: 3716-9678 PT Time Calculation (min) (ACUTE ONLY): 32 min  Charges:  $Therapeutic Activity: 23-37 mins                     Dominique Calvey L. Tamala Julian, [place] Pager [phone] 06/04/2019    Galen Manila 06/04/2019, 1:09 PM

## 2019-06-04 NOTE — Progress Notes (Signed)
Pt bladder scanned with 54cc noted on scanner. folley output remains low but JP drains has excessive output.

## 2019-06-04 NOTE — Progress Notes (Signed)
Leakage noted to drain insertion point as PT was getting pt out of bed to chair. Drain sponges applied to drain site and PA paged

## 2019-06-05 MED ORDER — FUROSEMIDE 10 MG/ML IJ SOLN
INTRAMUSCULAR | Status: AC
  Administered 2019-06-05: 14:00:00
  Filled 2019-06-05: qty 4

## 2019-06-05 MED ORDER — LOPERAMIDE HCL 2 MG PO CAPS
2.0000 mg | ORAL_CAPSULE | ORAL | Status: DC | PRN
  Administered 2019-06-05: 16:00:00 2 mg via ORAL
  Filled 2019-06-05: qty 1

## 2019-06-05 MED ORDER — FUROSEMIDE 10 MG/ML IJ SOLN
40.0000 mg | Freq: Once | INTRAMUSCULAR | Status: AC
  Administered 2019-06-05: 40 mg via INTRAVENOUS

## 2019-06-05 NOTE — Progress Notes (Signed)
[facility]:  [phone] General Surgery Progress Note   LOS: 9 days  POD -  3 Days Post-Op  Chief Complaint: Abdominal pain  Assessment and Plan: 1.  APPENDECTOMY LAPAROSCOPIC  For ruptured appendicitis  Rocephin/Flagyl  No labs today  Will advance to full liquids  2.  AKI with CKD stage III  Creatinine - 3.19 - 06/04/2019  3.  Chronic systolic heart failure/mild troponin elevation 4.  Hypertension 5.  Fall with right proximal humeral fracture To see Dr. [name] 6.  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  Multiple rounded, exophytic bilateral renal masses(cyst vs neoplasm) 8.  Severe malnutrition - prealbumin 6.3 (9/17) 9.  DVT prophylaxis - Lovenox   Principal Problem:   Appendicitis Active Problems:   Hypothyroidism   Essential hypertension   GERD   Osteoarthritis   Chronic systolic CHF (congestive heart failure) (HCC)   CKD (chronic kidney disease), stage III (HCC)   Right humeral fracture   Subjective:  Looks good, though puffy in the face.  She had a BM.  Abdomen less tender.  Objective:   Vitals:   06/04/19 2024 06/05/19 0445  BP: (!) 117/49 (!) 121/54  Pulse: 72 73  Resp: 17 16  Temp: 98.3 F (36.8 C) (!) 97.5 F (36.4 C)  SpO2: (!) 88% 92%     Intake/Output from previous day:  09/18 0701 - 09/19 0700 In: 220 [P.O.:120; I.V.:100] Out: 1290 [Urine:900; Drains:390]  Intake/Output this shift:  No intake/output data recorded.   Physical Exam:   General: WN older WF who is alert and oriented.   Looks puffy in the face.   HEENT: Normal. Pupils equal. .   Lungs: Clear.   Abdomen: Softer.  Has BS.     Wound: Clean.  RLQ drain - 390 cc recorded the last 24 hours.  Thin bloody fluid.   Extremities:  Left arm in sling   Lab Results:    Recent Labs    06/03/19 0632 06/04/19 0700  WBC 18.7* 15.9*  HGB 8.4* 7.7*  HCT 25.9* 24.2*  PLT 300 319     BMET   Recent Labs    06/03/19 0632 06/04/19 0700  NA 139 140  K 4.2 5.1  CL 109 116*  CO2 20* 15*  GLUCOSE 113* 81  BUN 58* 54*  CREATININE 3.49* 3.19*  CALCIUM 7.4* 6.7*    PT/INR  No results for input(s): LABPROT, INR in the last 72 hours.  ABG  No results for input(s): PHART, HCO3 in the last 72 hours.  Invalid input(s): PCO2, PO2   Studies/Results:  Dg Chest Port 1 View  Result Date: 06/04/2019 CLINICAL DATA:  Pulmonary edema. EXAM: PORTABLE CHEST 1 VIEW COMPARISON:  Radiograph of May 27, 2019. FINDINGS: Stable cardiomediastinal silhouette. Atherosclerosis of thoracic aorta is noted. No pneumothorax is noted. Minimal bibasilar subsegmental atelectasis is noted. Small left pleural effusion is noted. Stable elevated right hemidiaphragm is noted. Bony thorax is unremarkable. IMPRESSION: Minimal bibasilar subsegmental atelectasis. Small left pleural effusion. Aortic Atherosclerosis (ICD10-I70.0). Electronically Signed   By: Marijo Conception M.D.   On: 06/04/2019 09:58     Anti-infectives:   Anti-infectives (From admission, onward)   Start     Dose/Rate Route Frequency Ordered Stop   06/03/19 0930  cefTRIAXone (ROCEPHIN) 2 g in sodium chloride 0.9 % 100 mL IVPB     2 g 200 mL/hr over 30 Minutes Intravenous Every 24 hours 06/03/19 0926  06/03/19 0930  metroNIDAZOLE (FLAGYL) IVPB 500 mg     500 mg 100 mL/hr over 60 Minutes Intravenous Every 8 hours 06/03/19 0926     05/28/19 0100  piperacillin-tazobactam (ZOSYN) IVPB 2.25 g  Status:  Discontinued     2.25 g 100 mL/hr over 30 Minutes Intravenous Every 8 hours 05/27/19 1831 06/03/19 0925   05/27/19 1815  piperacillin-tazobactam (ZOSYN) IVPB 3.375 g     3.375 g 100 mL/hr over 30 Minutes Intravenous  Once 05/27/19 1814 05/27/19 2048   05/27/19 1630  cefTRIAXone (ROCEPHIN) 1 g in sodium chloride 0.9 % 100 mL IVPB     1 g 200 mL/hr over 30 Minutes Intravenous  Once 05/27/19 1628 05/27/19 1849   05/27/19 1630   metroNIDAZOLE (FLAGYL) IVPB 500 mg     500 mg 100 mL/hr over 60 Minutes Intravenous  Once 05/27/19 1628 05/27/19 2111      Alphonsa Overall, MD, FACS Pager: [facility]: [phone] 06/05/2019

## 2019-06-05 NOTE — Progress Notes (Signed)
PROGRESS NOTE    Mary Guzman  AQT:[id number] DOB: [birth year] DOA: 05/27/2019 PCP: [name], NP    Brief Narrative:  85 year old lady with prior history of hypertension hypothyroidism presented to ED on 9/10/20204 lower abdominal pain and right arm pain.  CT of the abdomen and pelvis showed Acute appendicitis with multiple appendicoliths and a small focal, contained perforation without abscess.  24 hours prior to the arrival to ED on 9/10, pt had a syncopal episode and fell on her right arm.  X-rays of the right arm shows Acute impacted and slightly angulated fracture involving the right humeral neck. Orthopedics consulted and recommended NWB in sling and follow up with Dr [name] in the office in 2 to 3 weeks.   Surgery consulted for the acute appendicitis and recommended non operative treatment with IV antibiotics and if she does not improve she might need surgery.  Since she has persistent leukocytosis she underwent a repeat  CT abdomen and pelvis showing Ill-defined appendix containing a cluster of small stones versus larger stone measuring 13 x 7 mm, The tiny linear focus of extraluminal gas identified on the previous study is stable. No evidence for organized fluid collection on the current study to suggest abscess.  She had dropped her blood pressure and her leukocytosis got worse on the morning of 06/02/2019 so she was taken to the OR by surgery and underwent laparoscopic appendectomy.     Assessment & Plan:   Principal Problem:   Appendicitis Active Problems:   Hypothyroidism   Essential hypertension   GERD   Osteoarthritis   Chronic systolic CHF (congestive heart failure) (HCC)   CKD (chronic kidney disease), stage III (HCC)   Right humeral fracture   Acute appendicitis with contained perforation with multiple appendicoliths: Initially surgery recommended treating with IV antibiotics however she dropped her blood pressure today and her leukocytosis got worse so she was  emergently taken to the OR underwent laparoscopic cholecystectomy by general surgery today.  We will continue antibiotics for now.  She is doing much better postoperatively.  Further management per general surgery.  Right humeral fracture: Secondary to a fall.  Right shoulder sling was placed and nonweightbearing bearing recommended by orthopedics.  Outpatient follow-up with Dr. [name] on discharge. Continue with pain control for now and nonweightbearing on the right side. Physical therapy evaluation recommending SNF.  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  Syncope: Probably secondary to orthostatic versus vasovagal from acute appendicitis. PT OT to see her.  Acute on stage III CKD: Baseline creatinine around 2.5 and creatinine has worsened with a peak 3.3.  Improved yesterday.  Morning labs are pending.  Had 450 cc urine output in last 24 hours.  Will try another dose of Lasix 40 mg IV today.  Chronic diastolic heart failure: No signs of fluid overload. Last echocardiogram from 2018 showedWall thickness was increased in a pattern of moderate LVH. The estimated ejection  fraction was 55%. Wall motion was normal; there were no regional   wall motion abnormalities. Septal-lateral dyssynchrony. Doppler  parameters are consistent with abnormal left ventricular relaxation (grade 1 diastolic dysfunction).   Hypothyroidism: Continue Synthroid.  Anemia of chronic disease: Hemoglobin is stable for most part.  Continue to watch daily.  Elevated troponins probably from demand ischemia from acute appendicitis.  GERD stable  Hypertension: Blood pressure controlled and better than yesterday.  Continue to monitor.  DVT prophylaxis: Lovenox Code Status: DNR Family Communication: Discussed with daughter [name] over the phone yesterday and addressed all her concerns.  Discussed with her over the phone once again today and I spent 15 minutes and addressed several concerns.  I did explain to her that the patient is advanced  age 85 year old and she has had the surgery so recovery is expected to be slow. Disposition Plan: PT OT recommends SNF.  I anticipate discharge early next week.   Consultants:   General surgery.   Procedures: Laparoscopic cholecystectomy on 06/02/2019  Antimicrobials: zosyn since admission.   Subjective: Patient seen and examined.  She was feeling better.  No abdominal pain or nausea.  She was much more alert and oriented today.  Looked puffy around eyes but slightly improved than yesterday.   Objective: Vitals:   06/04/19 1300 06/04/19 1803 06/04/19 2024 06/05/19 0445  BP: (!) 100/50 (!) 109/47 (!) 117/49 (!) 121/54  Pulse: 76 80 72 73  Resp:  17 17 16   Temp: 98.1 F (36.7 C) 98.1 F (36.7 C) 98.3 F (36.8 C) (!) 97.5 F (36.4 C)  TempSrc: Oral Oral Oral Oral  SpO2: 98% 92% (!) 88% 92%  Weight:      Height:        Intake/Output Summary (Last 24 hours) at 06/05/2019 1449 Last data filed at 06/05/2019 1154 Gross per 24 hour  Intake 370 ml  Output 1585 ml  Net -1215 ml   Filed Weights   06/02/19 0500 06/03/19 0500 06/04/19 0500  Weight: 68.7 kg 70.3 kg 70.3 kg    Examination:  General exam: Appears calm and comfortable  Respiratory system: Clear to auscultation. Respiratory effort normal. Cardiovascular system: S1 & S2 heard, RRR. No JVD, murmurs, rubs, gallops or clicks. No pedal edema. Gastrointestinal system: Abdomen is nondistended, soft and minimal generalized abdominal tenderness. No organomegaly or masses felt. Normal bowel sounds heard. Central nervous system: Alert and oriented. No focal neurological deficits. Extremities: Symmetric 5 x 5 power. Skin: No rashes, lesions or ulcers.  Psychiatry: Judgement and insight appear normal. Mood & affect appropriate.   Data Reviewed: I have personally reviewed following labs and imaging studies  CBC: Recent Labs  Lab 05/31/19 0416 06/01/19 0100 06/02/19 0130 06/03/19 0632 06/04/19 0700  WBC 12.2* 13.8*  22.0* 18.7* 15.9*  HGB 8.3* 9.0* 8.4* 8.4* 7.7*  HCT 26.4* 27.8* 26.9* 25.9* 24.2*  MCV 88.3 88.5 87.6 89.0 91.0  PLT 259 264 305 300 157   Basic Metabolic Panel: Recent Labs  Lab 05/31/19 0416 06/01/19 0100 06/02/19 0130 06/03/19 0632 06/04/19 0700  NA 136 139 138 139 140  K 3.6 4.0 3.5 4.2 5.1  CL 107 108 108 109 116*  CO2 19* 21* 19* 20* 15*  GLUCOSE 114* 122* 102* 113* 81  BUN 64* 60* 55* 58* 54*  CREATININE 3.16* 3.04* 3.05* 3.49* 3.19*  CALCIUM 7.6* 7.8* 7.7* 7.4* 6.7*   GFR: Estimated Creatinine Clearance: 9.9 mL/min (A) (by C-G formula based on SCr of 3.19 mg/dL (H)). Liver Function Tests: Recent Labs  Lab 06/03/19 0632  AST 16  ALT 9  ALKPHOS 169*  BILITOT 0.5  PROT 5.0*  ALBUMIN 1.3*   No results for input(s): LIPASE, AMYLASE in the last 168 hours. No results for input(s): AMMONIA in the last 168 hours. Coagulation Profile: No results for input(s): INR, PROTIME in the last 168 hours. Cardiac Enzymes: No results for input(s): CKTOTAL, CKMB, CKMBINDEX, TROPONINI in the last 168 hours. BNP (last 3 results) No results for input(s): PROBNP in the last 8760 hours. HbA1C: No results for input(s): HGBA1C in the last 72 hours. CBG:  No results for input(s): GLUCAP in the last 168 hours. Lipid Profile: No results for input(s): CHOL, HDL, LDLCALC, TRIG, CHOLHDL, LDLDIRECT in the last 72 hours. Thyroid Function Tests: No results for input(s): TSH, T4TOTAL, FREET4, T3FREE, THYROIDAB in the last 72 hours. Anemia Panel: No results for input(s): VITAMINB12, FOLATE, FERRITIN, TIBC, IRON, RETICCTPCT in the last 72 hours. Sepsis Labs: No results for input(s): PROCALCITON, LATICACIDVEN in the last 168 hours.  Recent Results (from the past 240 hour(s))  SARS Coronavirus 2 [facility] 2 order, Performed in [facility] hospital lab) Nasopharyngeal Nasopharyngeal Swab     Status: None   Collection Time: 05/27/19  6:26 PM   Specimen: Nasopharyngeal Swab  Result Value Ref  Range Status   SARS Coronavirus 2 NEGATIVE NEGATIVE Final    Comment: (NOTE) If result is NEGATIVE SARS-CoV-2 target nucleic acids are NOT DETECTED. The SARS-CoV-2 RNA is generally detectable in upper and lower  respiratory specimens during the acute phase of infection. The lowest  concentration of SARS-CoV-2 viral copies this assay can detect is 250  copies / mL. A negative result does not preclude SARS-CoV-2 infection  and should not be used as the sole basis for treatment or other  patient management decisions.  A negative result may occur with  improper specimen collection / handling, submission of specimen other  than nasopharyngeal swab, presence of viral mutation(s) within the  areas targeted by this assay, and inadequate number of viral copies  (<250 copies / mL). A negative result must be combined with clinical  observations, patient history, and epidemiological information. If result is POSITIVE SARS-CoV-2 target nucleic acids are DETECTED. The SARS-CoV-2 RNA is generally detectable in upper and lower  respiratory specimens dur ing the acute phase of infection.  Positive  results are indicative of active infection with SARS-CoV-2.  Clinical  correlation with patient history and other diagnostic information is  necessary to determine patient infection status.  Positive results do  not rule out bacterial infection or co-infection with other viruses. If result is PRESUMPTIVE POSTIVE SARS-CoV-2 nucleic acids MAY BE PRESENT.   A presumptive positive result was obtained on the submitted specimen  and confirmed on repeat testing.  While 2019 novel coronavirus  (SARS-CoV-2) nucleic acids may be present in the submitted sample  additional confirmatory testing may be necessary for epidemiological  and / or clinical management purposes  to differentiate between  SARS-CoV-2 and other Sarbecovirus currently known to infect humans.  If clinically indicated additional testing with an  alternate test  methodology [id number]) is advised. The SARS-CoV-2 RNA is generally  detectable in upper and lower respiratory sp ecimens during the acute  phase of infection. The expected result is Negative. Fact Sheet for Patients:  StrictlyIdeas.no Fact Sheet for Healthcare Providers: BankingDealers.co.za This test is not yet approved or cleared by the [place] FDA and has been authorized for detection and/or diagnosis of SARS-CoV-2 by FDA under an Emergency Use Authorization (EUA).  This EUA will remain in effect (meaning this test can be used) for the duration of the COVID-19 declaration under Section 564(b)(1) of the Act, 21 U.S.C. section 360bbb-3(b)(1), unless the authorization is terminated or revoked sooner. Performed at [facility], [place] [address], [place], [place] [address]   Surgical pcr screen     Status: None   Collection Time: 06/01/19 10:02 PM   Specimen: Nasal Mucosa; Nasal Swab  Result Value Ref Range Status   MRSA, PCR NEGATIVE NEGATIVE Final   Staphylococcus aureus NEGATIVE NEGATIVE Final  Comment: (NOTE) The Xpert SA Assay (FDA approved for NASAL specimens in patients 80 years of age and older), is one component of a comprehensive surveillance program. It is not intended to diagnose infection nor to guide or monitor treatment. Performed at [facility], [place] [address] [place], [place] [address]     Radiology Studies: Dg Chest Port 1 View  Result Date: 06/04/2019 CLINICAL DATA:  Pulmonary edema. EXAM: PORTABLE CHEST 1 VIEW COMPARISON:  Radiograph of May 27, 2019. FINDINGS: Stable cardiomediastinal silhouette. Atherosclerosis of thoracic aorta is noted. No pneumothorax is noted. Minimal bibasilar subsegmental atelectasis is noted. Small left pleural effusion is noted. Stable elevated right hemidiaphragm is noted. Bony thorax is unremarkable. IMPRESSION: Minimal bibasilar subsegmental  atelectasis. Small left pleural effusion. Aortic Atherosclerosis (ICD10-I70.0). Electronically Signed   By: Marijo Conception M.D.   On: 06/04/2019 09:58    Scheduled Meds: . enoxaparin (LOVENOX) injection  30 mg Subcutaneous Q24H  . feeding supplement  1 Container Oral TID BM  . levothyroxine  37.5 mcg Intravenous Daily  . mouth rinse  15 mL Mouth Rinse BID  . multivitamin with minerals  1 tablet Oral Daily  . pantoprazole (PROTONIX) IV  40 mg Intravenous QHS   Continuous Infusions: . sodium chloride 10 mL/hr at 06/02/19 0935  . cefTRIAXone (ROCEPHIN)  IV 2 g (06/05/19 1018)  . metronidazole 500 mg (06/05/19 1028)     LOS: 9 days   Time spent: 34 minutes.   Darliss Cheney, MD [facility] Pager [phone]  If 7PM-7AM, please contact night-coverage [email or web address] Password [facility] 06/05/2019, 2:49 PM

## 2019-06-06 ENCOUNTER — Inpatient Hospital Stay (HOSPITAL_COMMUNITY): Payer: PPO

## 2019-06-06 DIAGNOSIS — R609 Edema, unspecified: Secondary | ICD-10-CM

## 2019-06-06 LAB — BASIC METABOLIC PANEL
Anion gap: 11 (ref 5–15)
BUN: 54 mg/dL — ABNORMAL HIGH (ref 8–23)
CO2: 13 mmol/L — ABNORMAL LOW (ref 22–32)
Calcium: 7.2 mg/dL — ABNORMAL LOW (ref 8.9–10.3)
Chloride: 115 mmol/L — ABNORMAL HIGH (ref 98–111)
Creatinine, Ser: 3.21 mg/dL — ABNORMAL HIGH (ref 0.44–1.00)
GFR calc Af Amer: 13 mL/min — ABNORMAL LOW (ref >60)
GFR calc non Af Amer: 12 mL/min — ABNORMAL LOW (ref >60)
Glucose, Bld: 95 mg/dL (ref 70–99)
Potassium: 5.4 mmol/L — ABNORMAL HIGH (ref 3.5–5.1)
Sodium: 139 mmol/L (ref 135–145)

## 2019-06-06 LAB — CBC WITH DIFFERENTIAL/PLATELET
Abs Immature Granulocytes: 0.29 10*3/uL — ABNORMAL HIGH (ref 0.00–0.07)
Basophils Absolute: 0.1 10*3/uL (ref 0.0–0.1)
Basophils Relative: 0 %
Eosinophils Absolute: 0.3 10*3/uL (ref 0.0–0.5)
Eosinophils Relative: 2 %
HCT: 27.7 % — ABNORMAL LOW (ref 36.0–46.0)
Hemoglobin: 8.4 g/dL — ABNORMAL LOW (ref 12.0–15.0)
Immature Granulocytes: 3 %
Lymphocytes Relative: 10 %
Lymphs Abs: 1.1 10*3/uL (ref 0.7–4.0)
MCH: 27.7 pg (ref 26.0–34.0)
MCHC: 30.3 g/dL (ref 30.0–36.0)
MCV: 91.4 fL (ref 80.0–100.0)
Monocytes Absolute: 0.6 10*3/uL (ref 0.1–1.0)
Monocytes Relative: 6 %
Neutro Abs: 8.8 10*3/uL — ABNORMAL HIGH (ref 1.7–7.7)
Neutrophils Relative %: 79 %
Platelets: 426 10*3/uL — ABNORMAL HIGH (ref 150–400)
RBC: 3.03 MIL/uL — ABNORMAL LOW (ref 3.87–5.11)
RDW: 18.6 % — ABNORMAL HIGH (ref 11.5–15.5)
WBC: 11.2 10*3/uL — ABNORMAL HIGH (ref 4.0–10.5)
nRBC: 0 % (ref 0.0–0.2)

## 2019-06-06 MED ORDER — LEVOTHYROXINE SODIUM 75 MCG PO TABS
75.0000 ug | ORAL_TABLET | Freq: Every day | ORAL | Status: DC
  Administered 2019-06-07 – 2019-06-08 (×2): 75 ug via ORAL
  Filled 2019-06-06 (×2): qty 1

## 2019-06-06 MED ORDER — LOPERAMIDE HCL 2 MG PO CAPS
4.0000 mg | ORAL_CAPSULE | ORAL | Status: DC | PRN
  Administered 2019-06-07: 4 mg via ORAL
  Filled 2019-06-06: qty 2

## 2019-06-06 MED ORDER — PANTOPRAZOLE SODIUM 40 MG PO TBEC
40.0000 mg | DELAYED_RELEASE_TABLET | Freq: Every day | ORAL | Status: DC
  Administered 2019-06-06 – 2019-06-07 (×2): 40 mg via ORAL
  Filled 2019-06-06 (×2): qty 1

## 2019-06-06 MED ORDER — FUROSEMIDE 10 MG/ML IJ SOLN
40.0000 mg | Freq: Once | INTRAMUSCULAR | Status: AC
  Administered 2019-06-06: 12:00:00 40 mg via INTRAVENOUS
  Filled 2019-06-06: qty 4

## 2019-06-06 NOTE — Progress Notes (Signed)
PROGRESS NOTE    Mary Guzman  PZW:[id number] DOB: 11/30/1920 DOA: 05/27/2019 PCP: [name], NP    Brief Narrative:  85 year old lady with prior history of hypertension hypothyroidism presented to ED on 9/10/20204 lower abdominal pain and right arm pain.  CT of the abdomen and pelvis showed Acute appendicitis with multiple appendicoliths and a small focal, contained perforation without abscess.  24 hours prior to the arrival to ED on 9/10, pt had a syncopal episode and fell on her right arm.  X-rays of the right arm shows Acute impacted and slightly angulated fracture involving the right humeral neck. Orthopedics consulted and recommended NWB in sling and follow up with Dr [name] in the office in 2 to 3 weeks.   Surgery consulted for the acute appendicitis and recommended non operative treatment with IV antibiotics and if she does not improve she might need surgery.  Since she has persistent leukocytosis she underwent a repeat  CT abdomen and pelvis showing Ill-defined appendix containing a cluster of small stones versus larger stone measuring 13 x 7 mm, The tiny linear focus of extraluminal gas identified on the previous study is stable. No evidence for organized fluid collection on the current study to suggest abscess.  She had dropped her blood pressure and her leukocytosis got worse on the morning of 06/02/2019 so she was taken to the OR by surgery and underwent laparoscopic appendectomy.  Postoperatively, patient had low urine output.  She was given some IV fluid challenge which did not help and then she was given some Lasix which improved her urine output and improved her edema as well.  Assessment & Plan:   Principal Problem:   Appendicitis Active Problems:   Hypothyroidism   Essential hypertension   GERD   Osteoarthritis   Chronic systolic CHF (congestive heart failure) (HCC)   CKD (chronic kidney disease), stage III (HCC)   Right humeral fracture   Acute appendicitis with  contained perforation with multiple appendicoliths: Initially surgery recommended treating with IV antibiotics however she dropped her blood pressure today and her leukocytosis got worse so she was emergently taken to the OR underwent laparoscopic cholecystectomy by general surgery today.  We will continue antibiotics for now.  She is doing much better postoperatively.  She has been advanced to regular diet today.  Further management per general surgery.  Right humeral fracture: Secondary to a fall.  Right shoulder sling was placed and nonweightbearing bearing recommended by orthopedics.  Outpatient follow-up with Dr. [name] on discharge. Continue with pain control for now and nonweightbearing on the right side. Physical therapy evaluation recommending SNF.  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  Syncope: Probably secondary to orthostatic versus vasovagal from acute appendicitis. PT OT to see her.  Acute on stage III CKD: Baseline creatinine around 2.5 and creatinine has worsened with a peak 3.3.  It has improved a little bit and has been stable around that area.  She has picked up her urine output now.  We will give her another dose of Lasix 40 mg IV today.  Chronic diastolic heart failure: No signs of fluid overload. Last echocardiogram from 2018 showedWall thickness was increased in a pattern of moderate LVH. The estimated ejection  fraction was 55%. Wall motion was normal; there were no regional   wall motion abnormalities. Septal-lateral dyssynchrony. Doppler  parameters are consistent with abnormal left ventricular relaxation (grade 1 diastolic dysfunction).   Left arm swelling: Underwent Doppler ultrasound.  Has superficial thrombophlebitis.  No DVT.  We will keep  left arm elevated and apply cold packs.  Hypothyroidism: Continue Synthroid.  Anemia of chronic disease: Hemoglobin is stable for most part.  Continue to watch daily.  Elevated troponins probably from demand ischemia from acute  appendicitis.  GERD stable  Hypertension: Blood pressure controlled.  Continue to monitor.  Diarrhea: Has had only 3-4 bowel movements since yesterday.  She has chronic diarrhea though.  Will start on Imodium.  DVT prophylaxis: Lovenox Code Status: DNR Family Communication: Discussed with daughter Mary Guzman and son over the phone for 15 minutes.  Answered all the questions and concerns.  I did mention to them that we are pursuing SNF and that patient might be ready for discharge in next 24 to 48 hours if tolerated regular diet and cleared by general surgery. Disposition Plan: PT OT recommends SNF.  I anticipate discharge early next week.   Consultants:   General surgery.   Procedures: Laparoscopic cholecystectomy on 06/02/2019  Antimicrobials: zosyn since admission.   Subjective: Patient seen and examined.  Some left upper extremity swelling.  Abdominal pain is improving.  Complains of diarrhea but that is not new for her.   Objective: Vitals:   06/05/19 0445 06/05/19 1544 06/05/19 2029 06/06/19 0434  BP: (!) 121/54 (!) 131/55 (!) 122/57 129/77  Pulse: 73 72 70 71  Resp: 16 16 18  (!) 22  Temp: (!) 97.5 F (36.4 C) (!) 97.4 F (36.3 C) 97.7 F (36.5 C) 97.7 F (36.5 C)  TempSrc: Oral Oral Oral Oral  SpO2: 92% 99% 100% 97%  Weight:      Height:        Intake/Output Summary (Last 24 hours) at 06/06/2019 1207 Last data filed at 06/06/2019 0732 Gross per 24 hour  Intake 895.88 ml  Output 881 ml  Net 14.88 ml   Filed Weights   06/02/19 0500 06/03/19 0500 06/04/19 0500  Weight: 68.7 kg 70.3 kg 70.3 kg    Examination:  General exam: Appears calm and comfortable  Respiratory system: Clear to auscultation with some decreased breath sounds at the bases bilaterally. Respiratory effort normal. Cardiovascular system: S1 & S2 heard, RRR. No JVD, murmurs, rubs, gallops or clicks. No pedal edema. Gastrointestinal system: Abdomen is nondistended, soft and minimal generalized  tenderness. No organomegaly or masses felt. Normal bowel sounds heard.  Has drain in place. Central nervous system: Alert and oriented. No focal neurological deficits. Extremities: Very minimal swelling in left upper extremity Skin: No rashes, lesions or ulcers.  Psychiatry: Judgement and insight appear normal. Mood & affect appropriate.   Data Reviewed: I have personally reviewed following labs and imaging studies  CBC: Recent Labs  Lab 06/01/19 0100 06/02/19 0130 06/03/19 0632 06/04/19 0700 06/06/19 0507  WBC 13.8* 22.0* 18.7* 15.9* 11.2*  NEUTROABS  --   --   --   --  8.8*  HGB 9.0* 8.4* 8.4* 7.7* 8.4*  HCT 27.8* 26.9* 25.9* 24.2* 27.7*  MCV 88.5 87.6 89.0 91.0 91.4  PLT 264 305 300 319 683*   Basic Metabolic Panel: Recent Labs  Lab 06/01/19 0100 06/02/19 0130 06/03/19 0632 06/04/19 0700 06/06/19 0507  NA 139 138 139 140 139  K 4.0 3.5 4.2 5.1 5.4*  CL 108 108 109 116* 115*  CO2 21* 19* 20* 15* 13*  GLUCOSE 122* 102* 113* 81 95  BUN 60* 55* 58* 54* 54*  CREATININE 3.04* 3.05* 3.49* 3.19* 3.21*  CALCIUM 7.8* 7.7* 7.4* 6.7* 7.2*   GFR: Estimated Creatinine Clearance: 9.9 mL/min (A) (by C-G formula based  on SCr of 3.21 mg/dL (H)). Liver Function Tests: Recent Labs  Lab 06/03/19 0632  AST 16  ALT 9  ALKPHOS 169*  BILITOT 0.5  PROT 5.0*  ALBUMIN 1.3*   No results for input(s): LIPASE, AMYLASE in the last 168 hours. No results for input(s): AMMONIA in the last 168 hours. Coagulation Profile: No results for input(s): INR, PROTIME in the last 168 hours. Cardiac Enzymes: No results for input(s): CKTOTAL, CKMB, CKMBINDEX, TROPONINI in the last 168 hours. BNP (last 3 results) No results for input(s): PROBNP in the last 8760 hours. HbA1C: No results for input(s): HGBA1C in the last 72 hours. CBG: No results for input(s): GLUCAP in the last 168 hours. Lipid Profile: No results for input(s): CHOL, HDL, LDLCALC, TRIG, CHOLHDL, LDLDIRECT in the last 72  hours. Thyroid Function Tests: No results for input(s): TSH, T4TOTAL, FREET4, T3FREE, THYROIDAB in the last 72 hours. Anemia Panel: No results for input(s): VITAMINB12, FOLATE, FERRITIN, TIBC, IRON, RETICCTPCT in the last 72 hours. Sepsis Labs: No results for input(s): PROCALCITON, LATICACIDVEN in the last 168 hours.  Recent Results (from the past 240 hour(s))  SARS Coronavirus 2 [facility] order, Performed in [facility] hospital lab) Nasopharyngeal Nasopharyngeal Swab     Status: None   Collection Time: 05/27/19  6:26 PM   Specimen: Nasopharyngeal Swab  Result Value Ref Range Status   SARS Coronavirus 2 NEGATIVE NEGATIVE Final    Comment: (NOTE) If result is NEGATIVE SARS-CoV-2 target nucleic acids are NOT DETECTED. The SARS-CoV-2 RNA is generally detectable in upper and lower  respiratory specimens during the acute phase of infection. The lowest  concentration of SARS-CoV-2 viral copies this assay can detect is 250  copies / mL. A negative result does not preclude SARS-CoV-2 infection  and should not be used as the sole basis for treatment or other  patient management decisions.  A negative result may occur with  improper specimen collection / handling, submission of specimen other  than nasopharyngeal swab, presence of viral mutation(s) within the  areas targeted by this assay, and inadequate number of viral copies  (<250 copies / mL). A negative result must be combined with clinical  observations, patient history, and epidemiological information. If result is POSITIVE SARS-CoV-2 target nucleic acids are DETECTED. The SARS-CoV-2 RNA is generally detectable in upper and lower  respiratory specimens dur ing the acute phase of infection.  Positive  results are indicative of active infection with SARS-CoV-2.  Clinical  correlation with patient history and other diagnostic information is  necessary to determine patient infection status.  Positive results do  not rule out bacterial  infection or co-infection with other viruses. If result is PRESUMPTIVE POSTIVE SARS-CoV-2 nucleic acids MAY BE PRESENT.   A presumptive positive result was obtained on the submitted specimen  and confirmed on repeat testing.  While 2019 novel coronavirus  (SARS-CoV-2) nucleic acids may be present in the submitted sample  additional confirmatory testing may be necessary for epidemiological  and / or clinical management purposes  to differentiate between  SARS-CoV-2 and other Sarbecovirus currently known to infect humans.  If clinically indicated additional testing with an alternate test  methodology [id number]) is advised. The SARS-CoV-2 RNA is generally  detectable in upper and lower respiratory sp ecimens during the acute  phase of infection. The expected result is Negative. Fact Sheet for Patients:  StrictlyIdeas.no Fact Sheet for Healthcare Providers: BankingDealers.co.za This test is not yet approved or cleared by the [place] FDA and has been authorized  for detection and/or diagnosis of SARS-CoV-2 by FDA under an Emergency Use Authorization (EUA).  This EUA will remain in effect (meaning this test can be used) for the duration of the COVID-19 declaration under Section 564(b)(1) of the Act, 21 U.S.C. section 360bbb-3(b)(1), unless the authorization is terminated or revoked sooner. Performed at [facility], [place] [address], [place], [place] [address]   Surgical pcr screen     Status: None   Collection Time: 06/01/19 10:02 PM   Specimen: Nasal Mucosa; Nasal Swab  Result Value Ref Range Status   MRSA, PCR NEGATIVE NEGATIVE Final   Staphylococcus aureus NEGATIVE NEGATIVE Final    Comment: (NOTE) The Xpert SA Assay (FDA approved for NASAL specimens in patients 42 years of age and older), is one component of a comprehensive surveillance program. It is not intended to diagnose infection nor to guide or monitor  treatment. Performed at [facility], [place] [address] [place], [place] [address]     Radiology Studies: Vas [place] Upper Extremity Venous Duplex  Result Date: 06/06/2019 UPPER VENOUS STUDY  Indications: Edema Comparison Study: No prior study on file for comparison Performing Technologist: Sharion Dove RVS  Examination Guidelines: A complete evaluation includes B-mode imaging, spectral Doppler, color Doppler, and power Doppler as needed of all accessible portions of each vessel. Bilateral testing is considered an integral part of a complete examination. Limited examinations for reoccurring indications may be performed as noted.  Right Findings: +----------+------------+---------+-----------+----------+-------+  RIGHT      Compressible Phasicity Spontaneous Properties Summary  +----------+------------+---------+-----------+----------+-------+  Subclavian     Full        Yes        Yes                         +----------+------------+---------+-----------+----------+-------+  Left Findings: +----------+------------+---------+-----------+----------+-------+  LEFT       Compressible Phasicity Spontaneous Properties Summary  +----------+------------+---------+-----------+----------+-------+  IJV            Full        Yes        Yes                         +----------+------------+---------+-----------+----------+-------+  Subclavian     Full        Yes        Yes                         +----------+------------+---------+-----------+----------+-------+  Axillary       Full        Yes        Yes                         +----------+------------+---------+-----------+----------+-------+  Brachial       Full        Yes        Yes                         +----------+------------+---------+-----------+----------+-------+  Cephalic       None                                       Acute   +----------+------------+---------+-----------+----------+-------+  Basilic        Full                                                +----------+------------+---------+-----------+----------+-------+  Cephalic thrombus extends from IV site to just above AC.  Summary:  Right: No evidence of thrombosis in the subclavian.  Left: No evidence of deep vein thrombosis in the upper extremity. Findings consistent with acute superficial vein thrombosis involving the left cephalic vein.  *See table(s) above for measurements and observations.    Preliminary     Scheduled Meds:  enoxaparin (LOVENOX) injection  30 mg Subcutaneous Q24H   feeding supplement  1 Container Oral TID BM   [START ON 06/07/2019] levothyroxine  75 mcg Oral Q0600   mouth rinse  15 mL Mouth Rinse BID   multivitamin with minerals  1 tablet Oral Daily   pantoprazole  40 mg Oral QHS   Continuous Infusions:  sodium chloride 10 mL/hr at 06/02/19 0935   cefTRIAXone (ROCEPHIN)  IV 2 g (06/06/19 0857)   metronidazole 500 mg (06/06/19 0856)     LOS: 10 days   Time spent: 35 minutes.   Darliss [place], MD [facility] Pager [phone]  If 7PM-7AM, please contact night-coverage [email or web address] Password [email or web address] 06/06/2019, 12:07 PM

## 2019-06-06 NOTE — Progress Notes (Signed)
VASCULAR LAB PRELIMINARY  PRELIMINARY  PRELIMINARY  PRELIMINARY  Left upper extremity venous duplex completed.    Preliminary report:  See CV proc for preliminary results.  Gave report to Thess, RN  Bitha Fauteux, RVT 06/06/2019, 11:24 AM

## 2019-06-06 NOTE — Progress Notes (Addendum)
[facility] Office:  [phone] General Surgery Progress Note   LOS: 10 days  POD -  4 Days Post-Op  Chief Complaint: Abdominal pain  Assessment and Plan: 1.  APPENDECTOMY LAPAROSCOPIC - 9/16 Mary Guzman  For ruptured appendicitis  Rocephin/Flagyl - 9/17 >>>  WBC - 11,200 - 06/06/2019  On full liquids - to advance to reg diet.  Needs to ambulate - she has been getting to chair.  2.  AKI with CKD stage III  Creatinine - 3.21 - 06/06/2019  3.  Chronic systolic heart failure/mild troponin elevation 4.  Anemia  Hgb - 8.4 - 06/06/2019 5.  Hypertension 6.  Fall with right proximal humeral fracture To see Dr. [name] 7.  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  Multiple rounded, exophytic bilateral renal masses(cyst vs neoplasm) 9.  Severe malnutrition - prealbumin 6.3 (9/17) 10.  DVT prophylaxis - Lovenox   Principal Problem:   Appendicitis Active Problems:   Hypothyroidism   Essential hypertension   GERD   Osteoarthritis   Chronic systolic CHF (congestive heart failure) (HCC)   CKD (chronic kidney disease), stage III (HCC)   Right humeral fracture   Subjective:  Looks good, though puffy in the face.  She had a BM yesterday.  Minimal abdominal tenderness.  Objective:   Vitals:   06/05/19 2029 06/06/19 0434  BP: (!) 122/57 129/77  Pulse: 70 71  Resp: 18 (!) 22  Temp: 97.7 F (36.5 C) 97.7 F (36.5 C)  SpO2: 100% 97%     Intake/Output from previous day:  09/19 0701 - 09/20 0700 In: 1045.9 [P.O.:370; I.V.:135.2; IV Piggyback:540.7] Out: 1016 [Urine:775; Drains:240; Stool:1]  Intake/Output this shift:  Total I/O In: -  Out: 340 [Urine:300; Drains:40]   Physical Exam:   General: WN older WF who is alert and oriented.   Looks puffy in the face.   HEENT: Normal. Pupils equal. .   Lungs: Clear.   Abdomen: Softer.  Has BS.     Wound: Clean.  RLQ drain - 240 cc recorded the last 24  hours.  Thin bloody fluid.   Extremities:  Left arm in sling   Lab Results:    Recent Labs    06/04/19 0700 06/06/19 0507  WBC 15.9* 11.2*  HGB 7.7* 8.4*  HCT 24.2* 27.7*  PLT 319 426*    BMET   Recent Labs    06/04/19 0700 06/06/19 0507  NA 140 139  K 5.1 5.4*  CL 116* 115*  CO2 15* 13*  GLUCOSE 81 95  BUN 54* 54*  CREATININE 3.19* 3.21*  CALCIUM 6.7* 7.2*    PT/INR  No results for input(s): LABPROT, INR in the last 72 hours.  ABG  No results for input(s): PHART, HCO3 in the last 72 hours.  Invalid input(s): PCO2, PO2   Studies/Results:  Dg Chest Port 1 View  Result Date: 06/04/2019 CLINICAL DATA:  Pulmonary edema. EXAM: PORTABLE CHEST 1 VIEW COMPARISON:  Radiograph of May 27, 2019. FINDINGS: Stable cardiomediastinal silhouette. Atherosclerosis of thoracic aorta is noted. No pneumothorax is noted. Minimal bibasilar subsegmental atelectasis is noted. Small left pleural effusion is noted. Stable elevated right hemidiaphragm is noted. Bony thorax is unremarkable. IMPRESSION: Minimal bibasilar subsegmental atelectasis. Small left pleural effusion. Aortic Atherosclerosis (ICD10-I70.0). Electronically Signed   By: Marijo Conception M.D.   On: 06/04/2019 09:58     Anti-infectives:   Anti-infectives (From admission, onward)   Start  Dose/Rate Route Frequency Ordered Stop   06/03/19 0930  cefTRIAXone (ROCEPHIN) 2 g in sodium chloride 0.9 % 100 mL IVPB     2 g 200 mL/hr over 30 Minutes Intravenous Every 24 hours 06/03/19 0926     06/03/19 0930  metroNIDAZOLE (FLAGYL) IVPB 500 mg     500 mg 100 mL/hr over 60 Minutes Intravenous Every 8 hours 06/03/19 0926     05/28/19 0100  piperacillin-tazobactam (ZOSYN) IVPB 2.25 g  Status:  Discontinued     2.25 g 100 mL/hr over 30 Minutes Intravenous Every 8 hours 05/27/19 1831 06/03/19 0925   05/27/19 1815  piperacillin-tazobactam (ZOSYN) IVPB 3.375 g     3.375 g 100 mL/hr over 30 Minutes Intravenous  Once 05/27/19 1814  05/27/19 2048   05/27/19 1630  cefTRIAXone (ROCEPHIN) 1 g in sodium chloride 0.9 % 100 mL IVPB     1 g 200 mL/hr over 30 Minutes Intravenous  Once 05/27/19 1628 05/27/19 1849   05/27/19 1630  metroNIDAZOLE (FLAGYL) IVPB 500 mg     500 mg 100 mL/hr over 60 Minutes Intravenous  Once 05/27/19 1628 05/27/19 2111      Alphonsa Overall, MD, FACS Pager: [facility] Office: [phone] 06/06/2019

## 2019-06-06 NOTE — Progress Notes (Signed)
Pt having loose stool but according to the pt that is not new for her, she always have loose/incontinent bowel movement, will continue to monitor, MD discontinued imodium.

## 2019-06-06 NOTE — Progress Notes (Signed)
Pt edema on her lower leg , left arm and periorbital edema, given lasix, able to ambulate few steps, OOB to chair, her son at the bedside, had doppler left arm shows small thrombus above the peripheral IV, no BP on her arm Nurse tech aware, family wants the update paged Dr. Doristine Bosworth.

## 2019-06-07 LAB — CBC WITH DIFFERENTIAL/PLATELET
Abs Immature Granulocytes: 0.28 10*3/uL — ABNORMAL HIGH (ref 0.00–0.07)
Basophils Absolute: 0 10*3/uL (ref 0.0–0.1)
Basophils Relative: 0 %
Eosinophils Absolute: 0.2 10*3/uL (ref 0.0–0.5)
Eosinophils Relative: 1 %
HCT: 25.6 % — ABNORMAL LOW (ref 36.0–46.0)
Hemoglobin: 8.2 g/dL — ABNORMAL LOW (ref 12.0–15.0)
Immature Granulocytes: 3 %
Lymphocytes Relative: 11 %
Lymphs Abs: 1.1 10*3/uL (ref 0.7–4.0)
MCH: 28.6 pg (ref 26.0–34.0)
MCHC: 32 g/dL (ref 30.0–36.0)
MCV: 89.2 fL (ref 80.0–100.0)
Monocytes Absolute: 0.7 10*3/uL (ref 0.1–1.0)
Monocytes Relative: 6 %
Neutro Abs: 8.3 10*3/uL — ABNORMAL HIGH (ref 1.7–7.7)
Neutrophils Relative %: 79 %
Platelets: 472 10*3/uL — ABNORMAL HIGH (ref 150–400)
RBC: 2.87 MIL/uL — ABNORMAL LOW (ref 3.87–5.11)
RDW: 18.6 % — ABNORMAL HIGH (ref 11.5–15.5)
WBC: 10.5 10*3/uL (ref 4.0–10.5)
nRBC: 0.2 % (ref 0.0–0.2)

## 2019-06-07 LAB — BASIC METABOLIC PANEL
Anion gap: 9 (ref 5–15)
BUN: 54 mg/dL — ABNORMAL HIGH (ref 8–23)
CO2: 14 mmol/L — ABNORMAL LOW (ref 22–32)
Calcium: 7.2 mg/dL — ABNORMAL LOW (ref 8.9–10.3)
Chloride: 118 mmol/L — ABNORMAL HIGH (ref 98–111)
Creatinine, Ser: 3.11 mg/dL — ABNORMAL HIGH (ref 0.44–1.00)
GFR calc Af Amer: 14 mL/min — ABNORMAL LOW (ref >60)
GFR calc non Af Amer: 12 mL/min — ABNORMAL LOW (ref >60)
Glucose, Bld: 90 mg/dL (ref 70–99)
Potassium: 4.4 mmol/L (ref 3.5–5.1)
Sodium: 141 mmol/L (ref 135–145)

## 2019-06-07 LAB — SARS CORONAVIRUS 2 (TAT 6-24 HRS): SARS Coronavirus 2: NEGATIVE

## 2019-06-07 LAB — PREALBUMIN: Prealbumin: 10.6 mg/dL — ABNORMAL LOW (ref 18–38)

## 2019-06-07 MED ORDER — FUROSEMIDE 10 MG/ML IJ SOLN
40.0000 mg | Freq: Once | INTRAMUSCULAR | Status: AC
  Administered 2019-06-07: 40 mg via INTRAVENOUS
  Filled 2019-06-07: qty 4

## 2019-06-07 MED ORDER — DIPHENOXYLATE-ATROPINE 2.5-0.025 MG/5ML PO LIQD: 10.0000 mL | Freq: Four times a day (QID) | ORAL | Status: DC | PRN

## 2019-06-07 NOTE — Telephone Encounter (Signed)
Patient's daughter returned call to [facility].

## 2019-06-07 NOTE — Progress Notes (Signed)
5 Days Post-Op   Subjective/Chief Complaint: Pt states she has had diarrhea 2x today and has general weakness. She states it is very hard for her to even get up to go to the chair. Pt denies nausea, vomiting, and abdominal pain. Pt denies chest pain and SOB. Pt is tolerating diet well. Pt is voiding with catheter.   Objective: Vital signs in last 24 hours: Temp:  [97.4 F (36.3 C)-97.7 F (36.5 C)] 97.7 F (36.5 C) (09/21 0859) Pulse Rate:  [51-74] 69 (09/21 0859) Resp:  [18-20] 18 (09/20 2052) BP: (130-154)/(53-95) 154/80 (09/21 0859) SpO2:  [86 %-100 %] 100 % (09/21 0859) Last BM Date: 06/05/19  Intake/Output from previous day: 09/20 0701 - 09/21 0700 In: 1519.5 [P.O.:600; I.V.:230; IV Piggyback:689.5] Out: 1315 [Urine:725; Drains:590] Intake/Output this shift: No intake/output data recorded.  General: no acute distress  HEENT: Neck supple, no lymphadenopathy CV: Regular rate and rhythm Pulm: clear to auscultation in all fields  Abdomen: Multiple laparoscopic incisions healing well, abdomen is soft and nontender to palpation. Serosanguinous fluid from drain.  Normoactive BS.  Extremities: edema in right arm.    Lab Results:  Recent Labs    06/06/19 0507 06/07/19 0331  WBC 11.2* 10.5  HGB 8.4* 8.2*  HCT 27.7* 25.6*  PLT 426* 472*   BMET Recent Labs    06/06/19 0507 06/07/19 0331  NA 139 141  K 5.4* 4.4  CL 115* 118*  CO2 13* 14*  GLUCOSE 95 90  BUN 54* 54*  CREATININE 3.21* 3.11*  CALCIUM 7.2* 7.2*   PT/INR No results for input(s): LABPROT, INR in the last 72 hours. ABG No results for input(s): PHART, HCO3 in the last 72 hours.  Invalid input(s): PCO2, PO2  Studies/Results: Vas [place] Upper Extremity Venous Duplex  Result Date: 06/06/2019 UPPER VENOUS STUDY  Indications: Edema Comparison Study: No prior study on file for comparison Performing Technologist: Sharion Dove RVS  Examination Guidelines: A complete evaluation includes B-mode imaging, spectral  Doppler, color Doppler, and power Doppler as needed of all accessible portions of each vessel. Bilateral testing is considered an integral part of a complete examination. Limited examinations for reoccurring indications may be performed as noted.  Right Findings: +----------+------------+---------+-----------+----------+-------+ RIGHT     CompressiblePhasicitySpontaneousPropertiesSummary +----------+------------+---------+-----------+----------+-------+ Subclavian    Full       Yes       Yes                      +----------+------------+---------+-----------+----------+-------+  Left Findings: +----------+------------+---------+-----------+----------+-------+ LEFT      CompressiblePhasicitySpontaneousPropertiesSummary +----------+------------+---------+-----------+----------+-------+ IJV           Full       Yes       Yes                      +----------+------------+---------+-----------+----------+-------+ Subclavian    Full       Yes       Yes                      +----------+------------+---------+-----------+----------+-------+ Axillary      Full       Yes       Yes                      +----------+------------+---------+-----------+----------+-------+ Brachial      Full       Yes       Yes                      +----------+------------+---------+-----------+----------+-------+  Cephalic      None                                   Acute  +----------+------------+---------+-----------+----------+-------+ Basilic       Full                                          +----------+------------+---------+-----------+----------+-------+ Cephalic thrombus extends from IV site to just above AC.  Summary:  Right: No evidence of thrombosis in the subclavian.  Left: No evidence of deep vein thrombosis in the upper extremity. Findings consistent with acute superficial vein thrombosis involving the left cephalic vein.  *See table(s) above for measurements and  observations.    Preliminary     Anti-infectives: Anti-infectives (From admission, onward)   Start     Dose/Rate Route Frequency Ordered Stop   06/03/19 0930  cefTRIAXone (ROCEPHIN) 2 g in sodium chloride 0.9 % 100 mL IVPB     2 g 200 mL/hr over 30 Minutes Intravenous Every 24 hours 06/03/19 0926     06/03/19 0930  metroNIDAZOLE (FLAGYL) IVPB 500 mg     500 mg 100 mL/hr over 60 Minutes Intravenous Every 8 hours 06/03/19 0926     05/28/19 0100  piperacillin-tazobactam (ZOSYN) IVPB 2.25 g  Status:  Discontinued     2.25 g 100 mL/hr over 30 Minutes Intravenous Every 8 hours 05/27/19 1831 06/03/19 0925   05/27/19 1815  piperacillin-tazobactam (ZOSYN) IVPB 3.375 g     3.375 g 100 mL/hr over 30 Minutes Intravenous  Once 05/27/19 1814 05/27/19 2048   05/27/19 1630  cefTRIAXone (ROCEPHIN) 1 g in sodium chloride 0.9 % 100 mL IVPB     1 g 200 mL/hr over 30 Minutes Intravenous  Once 05/27/19 1628 05/27/19 1849   05/27/19 1630  metroNIDAZOLE (FLAGYL) IVPB 500 mg     500 mg 100 mL/hr over 60 Minutes Intravenous  Once 05/27/19 1628 05/27/19 2111      Assessment/Plan: POD 5 laparoscopic appendectomy- Dr. Lucia Gaskins   FEN: IVF and solid foods  ID: Rocephin/Flagyl DVT: Lovenox Consult PT to increase strength.   LOS: 11 days    Renato Gails Jachob Mcclean 06/07/2019

## 2019-06-07 NOTE — Progress Notes (Signed)
Physical Therapy Treatment Patient Details Name: Mary Guzman MRN: [id number] DOB: [birth year] Today's Date: 06/07/2019    History of Present Illness Pt is a 85 y.o. F with significant PMH of breast CA, hypertension, osteoporosis. Pt seen and discharged from [facility] ED 9/9 following a fall and subsequent R humerus fracture. Now presents to [facility] ED following syncopal episode. CT head negative for acute abnormality.  Pt s/p laprascopic appendectomy (06/04/19)    PT Comments    Pt did well getting OOB and standing from higher surface and sitting in c hair.  It was slow and worked on [employer] prior.  Pt limited by leg and back weakness and fear of falling.  I agree with SNF level care.  Pt still getting a lot of drainage in JP drain during PT session today.  Drain intact.  Pt will benefit from skilled PT and I anticipate slow progress.  Supportive daughter present.   Follow Up Recommendations  SNF;Supervision/Assistance - 24 hour     Equipment Recommendations  3in1 (PT);Wheelchair (measurements PT);Wheelchair cushion (measurements PT)    Recommendations for Other Services       Precautions / Restrictions Precautions Precautions: Fall Required Braces or Orthoses: Sling Restrictions RUE Weight Bearing: Non weight bearing Other Position/Activity Restrictions: Pt educated on NWB right and not pulling or pushing with this arm    Mobility  Bed Mobility Overal bed mobility: Needs Assistance Bed Mobility: Supine to Sit     Supine to sit: Mod assist;HOB elevated     General bed mobility comments: Pt did well - scooting legs over and helping to pull up into sitting with HOB elevated.  I had to assist pt to scoot forward with pad -b ut she helped  Transfers Overall transfer level: Needs assistance Equipment used: 1 person hand held assist Transfers: Sit to/from Stand;Stand Pivot Transfers Sit to Stand: From elevated surface;Mod assist Stand pivot transfers: Min assist;Mod assist        General transfer comment: We started by stretching out pts heelcords -so she could stand with weight flat.  pt scooted to EOB and she worked on getting nose over her toes. she sat up - with me helping with chuck but did well from elevated chair  Ambulation/Gait             General Gait Details: pt stood but unable to walk due to fatique today   Stairs             Wheelchair Mobility    Modified Rankin (Stroke Patients Only)       Balance       Sitting balance - Comments: pt sat with slumped posture = i had her work on erect trunk and hold for 10-20 seconds EOB twice       Standing balance comment: requires support - pt stood with PT assist only                            Cognition Arousal/Alertness: Awake/alert Behavior During Therapy: WFL for tasks assessed/performed Overall Cognitive Status: Within Functional Limits for tasks assessed                                 General Comments: pleasant and oriented to deficits.  pts daughter present for PT session      Exercises Other Exercises Other Exercises: Pt supine and sitting - heelcord stretches -  PROM and AROM Other Exercises: Pt sitting - long arc quads - hard - esp on left Other Exercises: Pt not understanding why some people tell her to use her right UE and some tell her not to.  I had her do open/close right hand and explained why this is different than trying to hold onto me in standing    General Comments General comments (skin integrity, edema, etc.): Pts daughter present and involved.  I showed her exercises to do with pt supien and sitting to help pt with legs      Pertinent Vitals/Pain Pain Score: 4  Pain Location: R arm and abdomen Pain Descriptors / Indicators: Grimacing;Sore Pain Intervention(s): Limited activity within patient's tolerance;Monitored during session;Repositioned    Home Living Family/patient expects to be discharged to:: Skilled nursing  facility                    Prior Function            PT Goals (current goals can now be found in the care plan section) Progress towards PT goals: Progressing toward goals    Frequency    Min 2X/week      PT Plan Current plan remains appropriate    Co-evaluation              AM-PAC PT "6 Clicks" Mobility   Outcome Measure  Help needed turning from your back to your side while in a flat bed without using bedrails?: A Lot Help needed moving from lying on your back to sitting on the side of a flat bed without using bedrails?: A Lot Help needed moving to and from a bed to a chair (including a wheelchair)?: A Lot Help needed standing up from a chair using your arms (e.g., wheelchair or bedside chair)?: A Lot Help needed to walk in hospital room?: Total Help needed climbing 3-5 steps with a railing? : Total 6 Click Score: 10    End of Session Equipment Utilized During Treatment: Gait belt Activity Tolerance: Patient tolerated treatment well Patient left: in chair;with chair alarm set;with family/visitor present;with call bell/phone within reach Nurse Communication: Mobility status PT Visit Diagnosis: Unsteadiness on feet (R26.81);Pain;Difficulty in walking, not elsewhere classified (R26.2);History of falling (Z91.81) Pain - Right/Left: Right Pain - part of body: Shoulder     Time: 1245-1315 PT Time Calculation (min) (ACUTE ONLY): 30 min  Charges:  $Therapeutic Exercise: 8-22 mins $Therapeutic Activity: 8-22 mins                     06/07/2019   Rande Lawman, PT    Loyal Buba 06/07/2019, 1:40 PM

## 2019-06-07 NOTE — Care Management Important Message (Signed)
Important Message  Patient Details  Name: Mary Guzman MRN: [id number] Date of Birth: 14-Jul-1921   Medicare Important Message Given:  Yes     Shelda Altes 06/07/2019, 2:52 PM

## 2019-06-07 NOTE — TOC Progression Note (Addendum)
Transition of Care [facility]) - Progression Note    Patient Details  Name: Mary Guzman MRN: [id number] Date of Birth: 1921-02-20  Transition of Care [facility]) CM/SW [place], [place] Phone Number: 06/07/2019, 9:54 AM  Clinical Narrative: 5:15pm- Provided pt daughter with update; she is aware pt was reswabbed for COVID and that we are still waiting on insurance approval.   11:24am- received message from Dr. Doristine Bosworth, pt likely dc tomorrow. Per surgery note drain will dc today. Pt daughter has completed paperwork for [facility]. CSW called RN line to initiate insurance auth with [employer]. Requested new COVID screen.      9:54am- Pt work up for SNF complete, per RN staff pt not medically stable. Pt prefers [facility] and their admissions staff is also awaiting stability to complete paperwork with family.    Expected Discharge Plan: [place] Barriers to Discharge: Continued Medical Work up, Ship broker  Expected Discharge Plan and Services Expected Discharge Plan: [place] In-house Referral: Clinical Social Work Discharge Planning Services: CM Consult Living arrangements for the past 2 months: Single Family Home   Social Determinants of Health (SDOH) Interventions    Readmission Risk Interventions No flowsheet data found.

## 2019-06-07 NOTE — Progress Notes (Signed)
Called to room by NT. NT report leaking from JP drain site. Did see minimal drainage from site. Reinforced dressing to site. Pt denies any symptoms and states she feels better than "yesterday". Pulse ox sluggish to register but easily obtained after applying heat to hands. Pt remains up in chair with daughter at side. Daughter requests pt to placed back to bed after dinner.

## 2019-06-07 NOTE — Progress Notes (Signed)
PROGRESS NOTE    Mary Guzman  MQK:[id number] DOB: 16-Dec-1920 DOA: 05/27/2019 PCP: [name], NP    Brief Narrative:  85 year old lady with prior history of hypertension hypothyroidism presented to ED on 9/10/20204 lower abdominal pain and right arm pain.  CT of the abdomen and pelvis showed Acute appendicitis with multiple appendicoliths and a small focal, contained perforation without abscess.  24 hours prior to the arrival to ED on 9/10, pt had a syncopal episode and fell on her right arm.  X-rays of the right arm shows Acute impacted and slightly angulated fracture involving the right humeral neck. Orthopedics consulted and recommended NWB in sling and follow up with Dr [name] in the office in 2 to 3 weeks.   Surgery consulted for the acute appendicitis and recommended non operative treatment with IV antibiotics and if she does not improve she might need surgery.  Since she has persistent leukocytosis she underwent a repeat  CT abdomen and pelvis showing Ill-defined appendix containing a cluster of small stones versus larger stone measuring 13 x 7 mm, The tiny linear focus of extraluminal gas identified on the previous study is stable. No evidence for organized fluid collection on the current study to suggest abscess.  She had dropped her blood pressure and her leukocytosis got worse on the morning of 06/02/2019 so she was taken to the OR by surgery and underwent laparoscopic appendectomy.  Postoperatively, patient had low urine output.  She was given some IV fluid challenge which did not help and then she was given some Lasix which improved her urine output and improved her edema as well.  Assessment & Plan:   Principal Problem:   Appendicitis Active Problems:   Hypothyroidism   Essential hypertension   GERD   Osteoarthritis   Chronic systolic CHF (congestive heart failure) (HCC)   CKD (chronic kidney disease), stage III (HCC)   Right humeral fracture   Acute appendicitis with  contained perforation with multiple appendicoliths: Initially surgery recommended treating with IV antibiotics however she dropped her blood pressure today and her leukocytosis got worse so she was emergently taken to the OR underwent laparoscopic cholecystectomy by general surgery today.  We will continue antibiotics for now.  She is doing much better postoperatively.  She has been advanced to regular diet today.  Note reviewed from surgery, looks like they are going to remove her drain today.  Further management per general surgery.  Right humeral fracture: Secondary to a fall.  Right shoulder sling was placed and nonweightbearing bearing recommended by orthopedics.  Outpatient follow-up with Dr. [name] on discharge. Continue with pain control for now and nonweightbearing on the right side. Physical therapy evaluation recommending SNF.  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  Syncope: Probably secondary to orthostatic versus vasovagal from acute appendicitis. PT OT to see her.  Acute on stage III CKD: Baseline creatinine around 2.5 and creatinine has worsened with a peak 3.3.  It has improved a little bit and has been stable around that area.  She has picked up her urine output now.  We will give her another dose of Lasix 40 mg IV today.  Chronic diastolic heart failure: No signs of fluid overload. Last echocardiogram from 2018 showedWall thickness was increased in a pattern of moderate LVH. The estimated ejection  fraction was 55%. Wall motion was normal; there were no regional   wall motion abnormalities. Septal-lateral dyssynchrony. Doppler  parameters are consistent with abnormal left ventricular relaxation (grade 1 diastolic dysfunction).   Left arm  swelling: Underwent Doppler ultrasound.  Has superficial thrombophlebitis.  No DVT.  We will keep left arm elevated and apply cold packs.  Hypothyroidism: Continue Synthroid.  Anemia of chronic disease: Hemoglobin is stable for most part.  Continue to watch daily.   Elevated troponins probably from demand ischemia from acute appendicitis.  GERD stable  Hypertension: Blood pressure controlled.  Continue to monitor.  Diarrhea: Has had only 3 loose bowel movements yesterday.  Imodium on board but not working.  We will also place Lomotil.  DVT prophylaxis: Lovenox Code Status: DNR Family Communication: Discussed with daughter [name] over the phone.  She was confused and was under the impression that patient was being discharged today since she received a phone call from [place] mentioning that.  I did tell her that we are planning on discharging her tomorrow. Disposition Plan: Discharge to SNF tomorrow.  Needs COVID negative.  Will order that later today.   Consultants:   General surgery.   Procedures: Laparoscopic cholecystectomy on 06/02/2019  Antimicrobials: zosyn since admission.   Subjective: Patient seen and examined this morning.  She feels better.  Has had only 3 loose bowel movements since yesterday.  She tells me that at home she has issue with diarrhea but she usually has 1-2 bowel movements.  She uses Imodium with improvement.  No abdominal pain or any other complaint.   Objective: Vitals:   06/06/19 1337 06/06/19 2052 06/07/19 0555 06/07/19 0859  BP:  (!) 139/53 (!) 142/80 (!) 154/80  Pulse:  68 74 69  Resp:  18    Temp:  97.7 F (36.5 C)  97.7 F (36.5 C)  TempSrc:  Oral  Oral  SpO2: 98% 98% 97% 100%  Weight:      Height:        Intake/Output Summary (Last 24 hours) at 06/07/2019 1226 Last data filed at 06/07/2019 1024 Gross per 24 hour  Intake 1379.48 ml  Output 1025 ml  Net 354.48 ml   Filed Weights   06/02/19 0500 06/03/19 0500 06/04/19 0500  Weight: 68.7 kg 70.3 kg 70.3 kg    Examination:  General exam: Appears calm and comfortable  Respiratory system: Clear to auscultation. Respiratory effort normal. Cardiovascular system: S1 & S2 heard, RRR. No JVD, murmurs, rubs, gallops or clicks. No pedal edema.  Gastrointestinal system: Abdomen is nondistended, soft and nontender. No organomegaly or masses felt. Normal bowel sounds heard.  Has a drain in place. Central nervous system: Alert and oriented. No focal neurological deficits. Extremities: Sling in right upper extremity Skin: No rashes, lesions or ulcers.  Psychiatry: Judgement and insight appear poor. Mood & affect appropriate.   Data Reviewed: I have personally reviewed following labs and imaging studies  CBC: Recent Labs  Lab 06/02/19 0130 06/03/19 0632 06/04/19 0700 06/06/19 0507 06/07/19 0331  WBC 22.0* 18.7* 15.9* 11.2* 10.5  NEUTROABS  --   --   --  8.8* 8.3*  HGB 8.4* 8.4* 7.7* 8.4* 8.2*  HCT 26.9* 25.9* 24.2* 27.7* 25.6*  MCV 87.6 89.0 91.0 91.4 89.2  PLT 305 300 319 426* 353*   Basic Metabolic Panel: Recent Labs  Lab 06/02/19 0130 06/03/19 0632 06/04/19 0700 06/06/19 0507 06/07/19 0331  NA 138 139 140 139 141  K 3.5 4.2 5.1 5.4* 4.4  CL 108 109 116* 115* 118*  CO2 19* 20* 15* 13* 14*  GLUCOSE 102* 113* 81 95 90  BUN 55* 58* 54* 54* 54*  CREATININE 3.05* 3.49* 3.19* 3.21* 3.11*  CALCIUM 7.7* 7.4*  6.7* 7.2* 7.2*   GFR: Estimated Creatinine Clearance: 10.2 mL/min (A) (by C-G formula based on SCr of 3.11 mg/dL (H)). Liver Function Tests: Recent Labs  Lab 06/03/19 0632  AST 16  ALT 9  ALKPHOS 169*  BILITOT 0.5  PROT 5.0*  ALBUMIN 1.3*   No results for input(s): LIPASE, AMYLASE in the last 168 hours. No results for input(s): AMMONIA in the last 168 hours. Coagulation Profile: No results for input(s): INR, PROTIME in the last 168 hours. Cardiac Enzymes: No results for input(s): CKTOTAL, CKMB, CKMBINDEX, TROPONINI in the last 168 hours. BNP (last 3 results) No results for input(s): PROBNP in the last 8760 hours. HbA1C: No results for input(s): HGBA1C in the last 72 hours. CBG: No results for input(s): GLUCAP in the last 168 hours. Lipid Profile: No results for input(s): CHOL, HDL, LDLCALC, TRIG,  CHOLHDL, LDLDIRECT in the last 72 hours. Thyroid Function Tests: No results for input(s): TSH, T4TOTAL, FREET4, T3FREE, THYROIDAB in the last 72 hours. Anemia Panel: No results for input(s): VITAMINB12, FOLATE, FERRITIN, TIBC, IRON, RETICCTPCT in the last 72 hours. Sepsis Labs: No results for input(s): PROCALCITON, LATICACIDVEN in the last 168 hours.  Recent Results (from the past 240 hour(s))  Surgical pcr screen     Status: None   Collection Time: 06/01/19 10:02 PM   Specimen: Nasal Mucosa; Nasal Swab  Result Value Ref Range Status   MRSA, PCR NEGATIVE NEGATIVE Final   Staphylococcus aureus NEGATIVE NEGATIVE Final    Comment: (NOTE) The Xpert SA Assay (FDA approved for NASAL specimens in patients 79 years of age and older), is one component of a comprehensive surveillance program. It is not intended to diagnose infection nor to guide or monitor treatment. Performed at [facility], [place] [address] [place], [place] [address]     Radiology Studies: Vas [place] Upper Extremity Venous Duplex  Result Date: 06/06/2019 UPPER VENOUS STUDY  Indications: Edema Comparison Study: No prior study on file for comparison Performing Technologist: Sharion Dove RVS  Examination Guidelines: A complete evaluation includes B-mode imaging, spectral Doppler, color Doppler, and power Doppler as needed of all accessible portions of each vessel. Bilateral testing is considered an integral part of a complete examination. Limited examinations for reoccurring indications may be performed as noted.  Right Findings: +----------+------------+---------+-----------+----------+-------+ RIGHT     CompressiblePhasicitySpontaneousPropertiesSummary +----------+------------+---------+-----------+----------+-------+ Subclavian    Full       Yes       Yes                      +----------+------------+---------+-----------+----------+-------+  Left Findings:  +----------+------------+---------+-----------+----------+-------+ LEFT      CompressiblePhasicitySpontaneousPropertiesSummary +----------+------------+---------+-----------+----------+-------+ IJV           Full       Yes       Yes                      +----------+------------+---------+-----------+----------+-------+ Subclavian    Full       Yes       Yes                      +----------+------------+---------+-----------+----------+-------+ Axillary      Full       Yes       Yes                      +----------+------------+---------+-----------+----------+-------+ Brachial      Full  Yes       Yes                      +----------+------------+---------+-----------+----------+-------+ Cephalic      None                                   Acute  +----------+------------+---------+-----------+----------+-------+ Basilic       Full                                          +----------+------------+---------+-----------+----------+-------+ Cephalic thrombus extends from IV site to just above AC.  Summary:  Right: No evidence of thrombosis in the subclavian.  Left: No evidence of deep vein thrombosis in the upper extremity. Findings consistent with acute superficial vein thrombosis involving the left cephalic vein.  *See table(s) above for measurements and observations.    Preliminary     Scheduled Meds: . enoxaparin (LOVENOX) injection  30 mg Subcutaneous Q24H  . feeding supplement  1 Container Oral TID BM  . furosemide  40 mg Intravenous Once  . levothyroxine  75 mcg Oral Q0600  . mouth rinse  15 mL Mouth Rinse BID  . multivitamin with minerals  1 tablet Oral Daily  . pantoprazole  40 mg Oral QHS   Continuous Infusions: . sodium chloride 10 mL/hr at 06/07/19 0910     LOS: 11 days   Time spent: 30 minutes.   Darliss Cheney, MD [facility] Pager [phone]  If 7PM-7AM, please contact night-coverage [email or web address] Password [facility]  06/07/2019, 12:26 PM

## 2019-06-08 DIAGNOSIS — Z9049 Acquired absence of other specified parts of digestive tract: Secondary | ICD-10-CM | POA: Diagnosis not present

## 2019-06-08 DIAGNOSIS — Z79899 Other long term (current) drug therapy: Secondary | ICD-10-CM | POA: Diagnosis not present

## 2019-06-08 DIAGNOSIS — N183 Chronic kidney disease, stage 3 unspecified: Secondary | ICD-10-CM | POA: Diagnosis not present

## 2019-06-08 DIAGNOSIS — Z48815 Encounter for surgical aftercare following surgery on the digestive system: Secondary | ICD-10-CM | POA: Diagnosis not present

## 2019-06-08 DIAGNOSIS — S42291D Other displaced fracture of upper end of right humerus, subsequent encounter for fracture with routine healing: Secondary | ICD-10-CM | POA: Diagnosis not present

## 2019-06-08 DIAGNOSIS — I5032 Chronic diastolic (congestive) heart failure: Secondary | ICD-10-CM | POA: Diagnosis not present

## 2019-06-08 DIAGNOSIS — S42301A Unspecified fracture of shaft of humerus, right arm, initial encounter for closed fracture: Secondary | ICD-10-CM | POA: Diagnosis not present

## 2019-06-08 DIAGNOSIS — E785 Hyperlipidemia, unspecified: Secondary | ICD-10-CM | POA: Diagnosis not present

## 2019-06-08 DIAGNOSIS — I447 Left bundle-branch block, unspecified: Secondary | ICD-10-CM | POA: Diagnosis not present

## 2019-06-08 DIAGNOSIS — Z853 Personal history of malignant neoplasm of breast: Secondary | ICD-10-CM | POA: Diagnosis not present

## 2019-06-08 DIAGNOSIS — S42301S Unspecified fracture of shaft of humerus, right arm, sequela: Secondary | ICD-10-CM | POA: Diagnosis not present

## 2019-06-08 DIAGNOSIS — R278 Other lack of coordination: Secondary | ICD-10-CM | POA: Diagnosis not present

## 2019-06-08 DIAGNOSIS — R6889 Other general symptoms and signs: Secondary | ICD-10-CM | POA: Diagnosis not present

## 2019-06-08 DIAGNOSIS — I509 Heart failure, unspecified: Secondary | ICD-10-CM | POA: Diagnosis not present

## 2019-06-08 DIAGNOSIS — R41841 Cognitive communication deficit: Secondary | ICD-10-CM | POA: Diagnosis not present

## 2019-06-08 DIAGNOSIS — I959 Hypotension, unspecified: Secondary | ICD-10-CM | POA: Diagnosis not present

## 2019-06-08 DIAGNOSIS — M6281 Muscle weakness (generalized): Secondary | ICD-10-CM | POA: Diagnosis not present

## 2019-06-08 DIAGNOSIS — K3533 Acute appendicitis with perforation and localized peritonitis, with abscess: Secondary | ICD-10-CM | POA: Diagnosis not present

## 2019-06-08 DIAGNOSIS — Z7401 Bed confinement status: Secondary | ICD-10-CM | POA: Diagnosis not present

## 2019-06-08 DIAGNOSIS — D649 Anemia, unspecified: Secondary | ICD-10-CM | POA: Diagnosis not present

## 2019-06-08 DIAGNOSIS — R55 Syncope and collapse: Secondary | ICD-10-CM | POA: Diagnosis not present

## 2019-06-08 DIAGNOSIS — N184 Chronic kidney disease, stage 4 (severe): Secondary | ICD-10-CM | POA: Diagnosis not present

## 2019-06-08 DIAGNOSIS — D631 Anemia in chronic kidney disease: Secondary | ICD-10-CM | POA: Diagnosis not present

## 2019-06-08 DIAGNOSIS — I130 Hypertensive heart and chronic kidney disease with heart failure and stage 1 through stage 4 chronic kidney disease, or unspecified chronic kidney disease: Secondary | ICD-10-CM | POA: Diagnosis not present

## 2019-06-08 DIAGNOSIS — Z23 Encounter for immunization: Secondary | ICD-10-CM | POA: Diagnosis not present

## 2019-06-08 DIAGNOSIS — I10 Essential (primary) hypertension: Secondary | ICD-10-CM | POA: Diagnosis not present

## 2019-06-08 DIAGNOSIS — S42301D Unspecified fracture of shaft of humerus, right arm, subsequent encounter for fracture with routine healing: Secondary | ICD-10-CM | POA: Diagnosis not present

## 2019-06-08 DIAGNOSIS — M1990 Unspecified osteoarthritis, unspecified site: Secondary | ICD-10-CM | POA: Diagnosis not present

## 2019-06-08 DIAGNOSIS — K219 Gastro-esophageal reflux disease without esophagitis: Secondary | ICD-10-CM | POA: Diagnosis not present

## 2019-06-08 DIAGNOSIS — D539 Nutritional anemia, unspecified: Secondary | ICD-10-CM | POA: Diagnosis not present

## 2019-06-08 DIAGNOSIS — Y92009 Unspecified place in unspecified non-institutional (private) residence as the place of occurrence of the external cause: Secondary | ICD-10-CM | POA: Diagnosis not present

## 2019-06-08 DIAGNOSIS — Z7982 Long term (current) use of aspirin: Secondary | ICD-10-CM | POA: Diagnosis not present

## 2019-06-08 DIAGNOSIS — Z79890 Hormone replacement therapy (postmenopausal): Secondary | ICD-10-CM | POA: Diagnosis not present

## 2019-06-08 DIAGNOSIS — W19XXXA Unspecified fall, initial encounter: Secondary | ICD-10-CM | POA: Diagnosis not present

## 2019-06-08 DIAGNOSIS — M2550 Pain in unspecified joint: Secondary | ICD-10-CM | POA: Diagnosis not present

## 2019-06-08 DIAGNOSIS — I5022 Chronic systolic (congestive) heart failure: Secondary | ICD-10-CM | POA: Diagnosis not present

## 2019-06-08 DIAGNOSIS — E559 Vitamin D deficiency, unspecified: Secondary | ICD-10-CM | POA: Diagnosis not present

## 2019-06-08 DIAGNOSIS — K3532 Acute appendicitis with perforation and localized peritonitis, without abscess: Secondary | ICD-10-CM | POA: Diagnosis not present

## 2019-06-08 DIAGNOSIS — E039 Hypothyroidism, unspecified: Secondary | ICD-10-CM | POA: Diagnosis not present

## 2019-06-08 DIAGNOSIS — R0902 Hypoxemia: Secondary | ICD-10-CM | POA: Diagnosis not present

## 2019-06-08 DIAGNOSIS — R2689 Other abnormalities of gait and mobility: Secondary | ICD-10-CM | POA: Diagnosis not present

## 2019-06-08 DIAGNOSIS — N189 Chronic kidney disease, unspecified: Secondary | ICD-10-CM | POA: Diagnosis not present

## 2019-06-08 DIAGNOSIS — E038 Other specified hypothyroidism: Secondary | ICD-10-CM | POA: Diagnosis not present

## 2019-06-08 DIAGNOSIS — M150 Primary generalized (osteo)arthritis: Secondary | ICD-10-CM | POA: Diagnosis not present

## 2019-06-08 DIAGNOSIS — I110 Hypertensive heart disease with heart failure: Secondary | ICD-10-CM | POA: Diagnosis not present

## 2019-06-08 DIAGNOSIS — Z20828 Contact with and (suspected) exposure to other viral communicable diseases: Secondary | ICD-10-CM | POA: Diagnosis not present

## 2019-06-08 DIAGNOSIS — R634 Abnormal weight loss: Secondary | ICD-10-CM | POA: Diagnosis not present

## 2019-06-08 DIAGNOSIS — R197 Diarrhea, unspecified: Secondary | ICD-10-CM | POA: Diagnosis not present

## 2019-06-08 LAB — BASIC METABOLIC PANEL
Anion gap: 9 (ref 5–15)
BUN: 50 mg/dL — ABNORMAL HIGH (ref 8–23)
CO2: 14 mmol/L — ABNORMAL LOW (ref 22–32)
Calcium: 7.5 mg/dL — ABNORMAL LOW (ref 8.9–10.3)
Chloride: 119 mmol/L — ABNORMAL HIGH (ref 98–111)
Creatinine, Ser: 3 mg/dL — ABNORMAL HIGH (ref 0.44–1.00)
GFR calc Af Amer: 15 mL/min — ABNORMAL LOW (ref >60)
GFR calc non Af Amer: 13 mL/min — ABNORMAL LOW (ref >60)
Glucose, Bld: 91 mg/dL (ref 70–99)
Potassium: 4.4 mmol/L (ref 3.5–5.1)
Sodium: 142 mmol/L (ref 135–145)

## 2019-06-08 LAB — CBC WITH DIFFERENTIAL/PLATELET
Abs Immature Granulocytes: 0 10*3/uL (ref 0.00–0.07)
Basophils Absolute: 0 10*3/uL (ref 0.0–0.1)
Basophils Relative: 0 %
Eosinophils Absolute: 0.1 10*3/uL (ref 0.0–0.5)
Eosinophils Relative: 1 %
HCT: 26.8 % — ABNORMAL LOW (ref 36.0–46.0)
Hemoglobin: 8.3 g/dL — ABNORMAL LOW (ref 12.0–15.0)
Lymphocytes Relative: 7 %
Lymphs Abs: 0.8 10*3/uL (ref 0.7–4.0)
MCH: 27.9 pg (ref 26.0–34.0)
MCHC: 31 g/dL (ref 30.0–36.0)
MCV: 89.9 fL (ref 80.0–100.0)
Monocytes Absolute: 0.4 10*3/uL (ref 0.1–1.0)
Monocytes Relative: 4 %
Neutro Abs: 9.7 10*3/uL — ABNORMAL HIGH (ref 1.7–7.7)
Neutrophils Relative %: 88 %
Platelets: 571 10*3/uL — ABNORMAL HIGH (ref 150–400)
RBC: 2.98 MIL/uL — ABNORMAL LOW (ref 3.87–5.11)
RDW: 18.9 % — ABNORMAL HIGH (ref 11.5–15.5)
WBC: 11 10*3/uL — ABNORMAL HIGH (ref 4.0–10.5)
nRBC: 0 % (ref 0.0–0.2)
nRBC: 2 /100 WBC — ABNORMAL HIGH

## 2019-06-08 MED ORDER — TRAMADOL-ACETAMINOPHEN 37.5-325 MG PO TABS: 1.0000 | ORAL_TABLET | Freq: Four times a day (QID) | ORAL | 0 refills | Status: DC | PRN

## 2019-06-08 MED ORDER — DIPHENOXYLATE-ATROPINE 2.5-0.025 MG/5ML PO LIQD: 10.0000 mL | Freq: Four times a day (QID) | ORAL | 0 refills | Status: AC | PRN

## 2019-06-08 NOTE — Telephone Encounter (Signed)
Left a message on voicemail for [name] (daughter) to call back and schedule virtual visit for weakness and inability to walk.  Informed [name] on voicemail that I have been on vacation and that is the reason it has taken so long to hear back from me.  CRM created.

## 2019-06-08 NOTE — Discharge Summary (Signed)
Physician Discharge Summary  Mary Guzman GGY:[id number] DOB: 08-10-1921 DOA: 05/27/2019  PCP: [name], NP  Admit date: 05/27/2019 Discharge date: 06/08/2019  Admitted From: Home Disposition: SNF  Recommendations for Outpatient Follow-up:  1. Follow up with PCP in 1-2 weeks 2. Follow-up with Dr. [name] from orthopedics in 1 week 3. Please obtain BMP/CBC in one week 4. Please follow up on the following pending results:  Home Health: None Equipment/Devices: None  Discharge Condition: Stable CODE STATUS: DNR Diet recommendation: Cardiac  Subjective: Seen and examined.  Feels much better.  No pain.  Diarrhea controlled.  Brief/Interim Summary: 85 year old lady with prior history of hypertension hypothyroidism presented to ED on 9/10/20204 lower abdominal pain and right arm pain.  CT of the abdomen and pelvis showed Acute appendicitis with multiple appendicoliths and a small focal, contained perforation without abscess.  24 hours prior to the arrival to ED on 9/10, pt had a syncopal episode and fell on her right arm.  X-rays of the right arm shows Acute impacted and slightly angulated fracture involving the right humeral neck. Orthopedics consulted and recommended NWB in sling and follow up with Dr [name] in the office in 2 to 3 weeks.   Surgery consulted for the acute appendicitis and they initially recommended non operative treatment with IV antibiotics. Since she has persistent leukocytosis she underwent a repeat  CT abdomen and pelvis showing Ill-defined appendix containing a cluster of small stones versus larger stone measuring 13 x 7 mm, The tiny linear focus of extraluminal gas identified on the previous study is stable. No evidence for organized fluid collection on the current study to suggest abscess.  She had dropped her blood pressure and her leukocytosis got worse on the morning of 06/02/2019 so she was taken to the OR by surgery and underwent laparoscopic appendectomy.   Postoperatively, patient had low urine output.  She was given some IV fluid challenge which did not help and then she was given some Lasix which improved her urine output and improved her edema as well.  Her intra-abdominal drain was removed yesterday.  She then developed diarrhea.  She was started on Imodium which did not help and subsequently she was started on Lomotil which helped her.  She is doing well today with good urine output and no complaints.  She has been cleared by general surgery.  She was evaluated by PT OT and they recommended SNF which has been arranged for her so she will be discharged in stable condition with her and her family's agreement.  Discharge Diagnoses:  Principal Problem:   Appendicitis Active Problems:   Hypothyroidism   Essential hypertension   GERD   Osteoarthritis   Chronic systolic CHF (congestive heart failure) (HCC)   CKD (chronic kidney disease), stage III (HCC)   Right humeral fracture    Discharge Instructions  Discharge Instructions    Discharge patient   Complete by: As directed    Discharge disposition: 03-Skilled [place]   Discharge patient date: 06/08/2019     Allergies as of 06/08/2019   No Known Allergies     Medication List    TAKE these medications   acetaminophen 500 MG tablet Commonly known as: TYLENOL Take 500 mg by mouth every 6 (six) hours as needed for mild pain.   amLODipine 10 MG tablet Commonly known as: NORVASC TAKE 1 TABLET(10 MG) BY MOUTH DAILY What changed: See the new instructions.   aspirin EC 81 MG tablet Take 1 tablet (81 mg total) by  mouth daily.   Calcium 600+D 600-800 MG-UNIT Tabs Generic drug: Calcium Carb-Cholecalciferol Take 1 tablet by mouth 2 (two) times daily.   carboxymethylcellulose 0.5 % Soln Commonly known as: REFRESH PLUS Place 1 drop into both eyes daily as needed (dry eye).   diphenoxylate-atropine 2.5-0.025 MG/5ML liquid Commonly known as: LOMOTIL Take 10 mLs by mouth 4  (four) times daily as needed for diarrhea or loose stools.   famotidine-calcium carbonate-magnesium hydroxide 10-800-165 MG chewable tablet Commonly known as: PEPCID COMPLETE Chew 1 tablet by mouth daily as needed.   furosemide 40 MG tablet Commonly known as: LASIX TAKE 1 TABLET BY MOUTH EVERY DAY.   Imodium A-D 2 MG tablet Generic drug: loperamide Take 2 mg by mouth 4 (four) times daily as needed for diarrhea or loose stools.   levothyroxine 75 MCG tablet Commonly known as: SYNTHROID Take 1 tablet (75 mcg total) by mouth daily before breakfast.   metoprolol succinate 25 MG 24 hr tablet Commonly known as: TOPROL-XL TAKE 1 TABLET(25 MG) BY MOUTH TWICE DAILY What changed: See the new instructions.   PRESERVISION AREDS 2 PO Take 2 tablets by mouth daily.   traMADol-acetaminophen 37.5-325 MG tablet Commonly known as: Ultracet Take 1 tablet by mouth every 6 (six) hours as needed.   Vitamin D3 125 MCG (5000 UT) Caps Take 1 capsule by mouth daily.      Follow-up Information    Nafziger, Tommi Rumps, NP Follow up in 1 week(s).   Specialty: Family Medicine Contact information: [place] [address] [phone]        Netta Cedars, MD Follow up in 1 week(s).   Specialty: Orthopedic Surgery Contact information: [address] [place] [address] [phone]          No Known Allergies  Consultations: General surgery   Procedures/Studies: Ct Abdomen Pelvis Wo Contrast  Result Date: 05/30/2019 CLINICAL DATA:  Generalized abdominal pain since yesterday. Fever. Non operative management for acute appendicitis. EXAM: CT ABDOMEN AND PELVIS WITHOUT CONTRAST TECHNIQUE: Multidetector CT imaging of the abdomen and pelvis was performed following the standard protocol without IV contrast. COMPARISON:  05/27/2019 FINDINGS: Lower chest: Interval development of small bilateral pleural effusions with some dependent atelectasis in the lower lobes  bilaterally. Hepatobiliary: No focal abnormality in the liver on this study without intravenous contrast. Gallbladder is markedly distended. Mild extrahepatic biliary duct prominence is similar with 8 mm diameter common bile duct in the head of pancreas. Pancreas: No focal mass lesion. No dilatation of the main duct. No intraparenchymal cyst. No peripancreatic edema. Spleen: No splenomegaly. No focal mass lesion. Adrenals/Urinary Tract: No adrenal nodule or mass. Bilateral renal lesions are evident, some compatible with cysts and others with attenuation too high to be simple cysts. No hydroureteronephrosis. The urinary bladder appears normal for the degree of distention. Some exophytic homogeneous Stomach/Bowel: Stomach is unremarkable. No gastric wall thickening. No evidence of outlet obstruction. Duodenum is normally positioned as is the ligament of Treitz. No small bowel wall thickening. No small bowel dilatation. The terminal ileum is normal. Similar appearance of the appendix with 13 x 7 mm appendicoliths visible on 61/3. Small linear focus of apparent extraluminal gas adjacent to the appendix on 59/3 is stable. No new focal fluid collection in the right lower quadrant. Vascular/Lymphatic: There is abdominal aortic atherosclerosis without aneurysm. There is no gastrohepatic or hepatoduodenal ligament lymphadenopathy. No intraperitoneal or retroperitoneal lymphadenopathy. No pelvic sidewall lymphadenopathy. Reproductive: The uterus is unremarkable.  There is no adnexal mass.  Other: Trace free fluid noted in the cul-de-sac. Body wall edema noted lower abdomen and pelvis. Musculoskeletal: Left lower quadrant groin hernia dissects cranially between the internal and external oblique fascia. This hernia contains trace fluid but no bowel. Chronic posttraumatic deformity noted left pubic bone. Bones are diffusely demineralized. No worrisome lytic or sclerotic osseous abnormality. IMPRESSION: 1. No substantial interval  change in abdomen/pelvis exam. 2. Ill-defined appendix containing a cluster of small stones versus larger stone measuring 13 x 7 mm. The tiny linear focus of extraluminal gas identified on the previous study is stable. No evidence for organized fluid collection on the current study to suggest abscess. No additional free gas or free fluid identified. 3. Interval development of small bilateral pleural effusions with dependent bilateral lower lobe atelectasis. 4. Bilateral renal lesions, some of which are compatible with cysts and others which have attenuation too high to be simple cysts. These may well represent cyst complicated by proteinaceous debris or hemorrhage, but neoplasm cannot be excluded. Follow-up could be used to ensure stability as clinically warranted. 5.  Aortic Atherosclerois (ICD10-170.0) Electronically Signed   By: Misty Stanley M.D.   On: 05/30/2019 15:29   Ct Abdomen Pelvis Wo Contrast  Result Date: 05/27/2019 CLINICAL DATA:  Acute, diffuse abdominal pain following a fall due to a syncopal episode. EXAM: CT ABDOMEN AND PELVIS WITHOUT CONTRAST TECHNIQUE: Multidetector CT imaging of the abdomen and pelvis was performed following the standard protocol without IV contrast. COMPARISON:  None. FINDINGS: Lower chest: Enlarged heart. Dense mitral valve annulus calcifications found coronary artery calcifications. Minimal linear atelectasis or scarring at the right lung base. Hepatobiliary: No focal liver abnormality is seen. No gallstones, gallbladder wall thickening, or biliary dilatation. Pancreas: Moderate diffuse pancreatic atrophy. Spleen: Normal in size without focal abnormality. Adrenals/Urinary Tract: Normal appearing adrenal glands. Multiple rounded, exophytic bilateral renal masses and left renal parapelvic cysts. The exophytic masses range between water density and a maximum of 34 Hounsfield units in density. Unremarkable urinary bladder. No visible ureteral abnormalities. No calculi or  hydronephrosis. Stomach/Bowel: Small hiatal hernia. Enlarged appendix containing multiple appendicoliths. The appendix measures up to 12 mm in diameter. Mild adjacent soft tissue stranding and fat induration. Small focal area of adjacent gas with a somewhat linear configuration. The appendix origin is in the upper pelvis on the right. The appendix extends horizontally toward the midline. Unremarkable small bowel and colon. Vascular/Lymphatic: Atheromatous arterial calcifications without aneurysm. No enlarged lymph nodes. Reproductive: Uterus and bilateral adnexa are unremarkable. Other: No free peritoneal fluid or air. Musculoskeletal: Comminuted left pubic body fracture with sclerosis and corticated fragments. 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 No acute fractures are seen. Left sacral bone island. IMPRESSION: 1. Acute appendicitis with multiple appendicoliths and a small focal, contained perforation without abscess. 2. No evidence of acute abdominal or pelvic injury. 3. Comminuted left pubic body fracture with sclerosis and corticated fragments, compatible with an old fracture with nonunion. 4. Small hiatal hernia. 5. Cardiomegaly. 6. Dense mitral valve annulus and coronary artery atherosclerosis. 7. Multiple bilateral renal masses, as described above. These could represent a combination of simple and complicated cysts and/or solid masses. Electronically Signed   By: Claudie Revering M.D.   On: 05/27/2019 16:23   Ct Head Wo Contrast  Result Date: 05/27/2019 CLINICAL DATA:  Syncopal episode and confusion today. Fell yesterday. EXAM: CT HEAD WITHOUT CONTRAST TECHNIQUE: Contiguous axial images were obtained from the base of the skull through the vertex without intravenous contrast. COMPARISON:  None. FINDINGS: Brain: Moderately  enlarged ventricles and subarachnoid spaces. Moderate patchy white matter low density in both cerebral hemispheres. Old left thalamus lacunar infarct. No intracranial hemorrhage, mass lesion or CT  evidence of acute infarction. Vascular: No hyperdense vessel or unexpected calcification. Skull: Normal. Negative for fracture or focal lesion. Sinuses/Orbits: Status post bilateral cataract extraction. Unremarkable bones and included paranasal sinuses. Other: None. IMPRESSION: 1. No acute abnormality. 2. Moderate diffuse cerebral and cerebellar atrophy. 3. Moderate chronic small vessel white matter ischemic changes in both cerebral hemispheres. 4. Old left thalamus lacunar infarct. Electronically Signed   By: Claudie Revering M.D.   On: 05/27/2019 16:06   Dg Chest Port 1 View  Result Date: 06/04/2019 CLINICAL DATA:  Pulmonary edema. EXAM: PORTABLE CHEST 1 VIEW COMPARISON:  Radiograph of May 27, 2019. FINDINGS: Stable cardiomediastinal silhouette. Atherosclerosis of thoracic aorta is noted. No pneumothorax is noted. Minimal bibasilar subsegmental atelectasis is noted. Small left pleural effusion is noted. Stable elevated right hemidiaphragm is noted. Bony thorax is unremarkable. IMPRESSION: Minimal bibasilar subsegmental atelectasis. Small left pleural effusion. Aortic Atherosclerosis (ICD10-I70.0). Electronically Signed   By: Marijo Conception M.D.   On: 06/04/2019 09:58   Dg Chest Portable 1 View  Result Date: 05/27/2019 CLINICAL DATA:  Syncope, shortness of breath, known right humeral fracture EXAM: PORTABLE CHEST 1 VIEW COMPARISON:  Humeral radiograph 1 day prior, chest radiograph 09/22/2017 FINDINGS: Redemonstration of the impacted and angulated fracture involving the surgical neck of the right humerus with some increasing inferior displacement of the humeral head relative to the glenoid, possibly reflecting a shoulder effusion. Elevation of the right hemidiaphragm is similar to comparison radiographs. There streaky atelectatic changes. Mild diffuse airways thickening is present. No consolidation, features of edema, pneumothorax, or effusion. The aorta is calcified. The remaining cardiomediastinal  contours are unremarkable. Post mastectomy changes involving the left chest wall and axilla. IMPRESSION: Atelectasis and mild bronchitic changes. Chronic elevation of the right hemidiaphragm. Redemonstration of the impacted and angulated fracture of the right humeral surgical neck with increasing inferior displacement possibly reflecting developing effusion. Electronically Signed   By: Lovena Le M.D.   On: 05/27/2019 14:03   Dg Humerus Right  Result Date: 05/26/2019 CLINICAL DATA:  [place] at home, upper arm pain EXAM: RIGHT HUMERUS - 2+ VIEW COMPARISON:  None. FINDINGS: Acute fracture involving the right humeral neck with impaction and mild posterior angulation of distal fracture fragment. Humeral head appears to project over the glenoid. IMPRESSION: Acute impacted and slightly angulated fracture involving the right humeral neck Electronically Signed   By: Donavan Foil M.D.   On: 05/26/2019 13:40   Vas [place] Upper Extremity Venous Duplex  Result Date: 06/07/2019 UPPER VENOUS STUDY  Indications: Edema Comparison Study: No prior study on file for comparison Performing Technologist: Sharion Dove RVS  Examination Guidelines: A complete evaluation includes B-mode imaging, spectral Doppler, color Doppler, and power Doppler as needed of all accessible portions of each vessel. Bilateral testing is considered an integral part of a complete examination. Limited examinations for reoccurring indications may be performed as noted.  Right Findings: +----------+------------+---------+-----------+----------+-------+ RIGHT     CompressiblePhasicitySpontaneousPropertiesSummary +----------+------------+---------+-----------+----------+-------+ Subclavian    Full       Yes       Yes                      +----------+------------+---------+-----------+----------+-------+  Left Findings: +----------+------------+---------+-----------+----------+-------+ LEFT       CompressiblePhasicitySpontaneousPropertiesSummary +----------+------------+---------+-----------+----------+-------+ IJV           Full  Yes       Yes                      +----------+------------+---------+-----------+----------+-------+ Subclavian    Full       Yes       Yes                      +----------+------------+---------+-----------+----------+-------+ Axillary      Full       Yes       Yes                      +----------+------------+---------+-----------+----------+-------+ Brachial      Full       Yes       Yes                      +----------+------------+---------+-----------+----------+-------+ Cephalic      None                                   Acute  +----------+------------+---------+-----------+----------+-------+ Basilic       Full                                          +----------+------------+---------+-----------+----------+-------+ Cephalic thrombus extends from IV site to just above AC.  Summary:  Right: No evidence of thrombosis in the subclavian.  Left: No evidence of deep vein thrombosis in the upper extremity. Findings consistent with acute superficial vein thrombosis involving the left cephalic vein.  *See table(s) above for measurements and observations.  Diagnosing physician: Monica Martinez MD Electronically signed by Monica Martinez MD on 06/07/2019 at 4:22:42 PM.    Final      Discharge Exam: Vitals:   06/08/19 0451 06/08/19 0926  BP: (!) 149/88 (!) 146/89  Pulse: 66 68  Resp: 18 20  Temp: (!) 97.5 F (36.4 C) 97.6 F (36.4 C)  SpO2: 98% 99%   Vitals:   06/07/19 2147 06/08/19 0451 06/08/19 0500 06/08/19 0926  BP: (!) 144/53 (!) 149/88  (!) 146/89  Pulse: 67 66  68  Resp: 16 18  20   Temp: 98 F (36.7 C) (!) 97.5 F (36.4 C)  97.6 F (36.4 C)  TempSrc: Oral Oral  Oral  SpO2: 99% 98%  99%  Weight:   70.4 kg   Height:        General: Pt is alert, awake, not in acute distress Cardiovascular:  RRR, S1/S2 +, no rubs, no gallops Respiratory: CTA bilaterally, no wheezing, no rhonchi Abdominal: Soft, NT, ND, bowel sounds + Extremities: no edema, no cyanosis    The results of significant diagnostics from this hospitalization (including imaging, microbiology, ancillary and laboratory) are listed below for reference.     Microbiology: Recent Results (from the past 240 hour(s))  Surgical pcr screen     Status: None   Collection Time: 06/01/19 10:02 PM   Specimen: Nasal Mucosa; Nasal Swab  Result Value Ref Range Status   MRSA, PCR NEGATIVE NEGATIVE Final   Staphylococcus aureus NEGATIVE NEGATIVE Final    Comment: (NOTE) The Xpert SA Assay (FDA approved for NASAL specimens in patients 25 years of age and older), is one component of a comprehensive surveillance program. It is not intended to diagnose infection nor to  guide or monitor treatment. Performed at [facility], [place] [address], [place], [place] [address]   SARS CORONAVIRUS 2 (TAT 6-24 HRS) Nasopharyngeal Nasopharyngeal Swab     Status: None   Collection Time: 06/07/19  1:01 PM   Specimen: Nasopharyngeal Swab  Result Value Ref Range Status   SARS Coronavirus 2 NEGATIVE NEGATIVE Final    Comment: (NOTE) SARS-CoV-2 target nucleic acids are NOT DETECTED. The SARS-CoV-2 RNA is generally detectable in upper and lower respiratory specimens during the acute phase of infection. Negative results do not preclude SARS-CoV-2 infection, do not rule out co-infections with other pathogens, and should not be used as the sole basis for treatment or other patient management decisions. Negative results must be combined with clinical observations, patient history, and epidemiological information. The expected result is Negative. Fact Sheet for Patients: SugarRoll.be Fact Sheet for Healthcare Providers: https://www.woods-mathews.com/ This test is not yet approved or cleared by the [place] FDA and  has been authorized for detection and/or diagnosis of SARS-CoV-2 by FDA under an Emergency Use Authorization (EUA). This EUA will remain  in effect (meaning this test can be used) for the duration of the COVID-19 declaration under Section 56 4(b)(1) of the Act, 21 U.S.C. section 360bbb-3(b)(1), unless the authorization is terminated or revoked sooner. Performed at [facility], [place] [address] [place], [place] [address]      Labs: BNP (last 3 results) No results for input(s): BNP in the last 8760 hours. Basic Metabolic Panel: Recent Labs  Lab 06/03/19 0632 06/04/19 0700 06/06/19 0507 06/07/19 0331 06/08/19 0816  NA 139 140 139 141 142  K 4.2 5.1 5.4* 4.4 4.4  CL 109 116* 115* 118* 119*  CO2 20* 15* 13* 14* 14*  GLUCOSE 113* 81 95 90 91  BUN 58* 54* 54* 54* 50*  CREATININE 3.49* 3.19* 3.21* 3.11* 3.00*  CALCIUM 7.4* 6.7* 7.2* 7.2* 7.5*   Liver Function Tests: Recent Labs  Lab 06/03/19 0632  AST 16  ALT 9  ALKPHOS 169*  BILITOT 0.5  PROT 5.0*  ALBUMIN 1.3*   No results for input(s): LIPASE, AMYLASE in the last 168 hours. No results for input(s): AMMONIA in the last 168 hours. CBC: Recent Labs  Lab 06/03/19 0632 06/04/19 0700 06/06/19 0507 06/07/19 0331 06/08/19 0816  WBC 18.7* 15.9* 11.2* 10.5 11.0*  NEUTROABS  --   --  8.8* 8.3* 9.7*  HGB 8.4* 7.7* 8.4* 8.2* 8.3*  HCT 25.9* 24.2* 27.7* 25.6* 26.8*  MCV 89.0 91.0 91.4 89.2 89.9  PLT 300 319 426* 472* 571*   Cardiac Enzymes: No results for input(s): CKTOTAL, CKMB, CKMBINDEX, TROPONINI in the last 168 hours. BNP: Invalid input(s): POCBNP CBG: No results for input(s): GLUCAP in the last 168 hours. D-Dimer No results for input(s): DDIMER in the last 72 hours. Hgb A1c No results for input(s): HGBA1C in the last 72 hours. Lipid Profile No results for input(s): CHOL, HDL, LDLCALC, TRIG, CHOLHDL, LDLDIRECT in the last 72 hours. Thyroid function studies No results for input(s): TSH,  T4TOTAL, T3FREE, THYROIDAB in the last 72 hours.  Invalid input(s): FREET3 Anemia work up No results for input(s): VITAMINB12, FOLATE, FERRITIN, TIBC, IRON, RETICCTPCT in the last 72 hours. Urinalysis    Component Value Date/Time   COLORURINE YELLOW 06/03/2019 0827   APPEARANCEUR HAZY (A) 06/03/2019 0827   LABSPEC 1.021 06/03/2019 0827   PHURINE 5.0 06/03/2019 0827   GLUCOSEU NEGATIVE 06/03/2019 0827   HGBUR NEGATIVE 06/03/2019 0827   BILIRUBINUR  NEGATIVE 06/03/2019 0827   KETONESUR NEGATIVE 06/03/2019 0827   PROTEINUR NEGATIVE 06/03/2019 0827   NITRITE NEGATIVE 06/03/2019 0827   LEUKOCYTESUR SMALL (A) 06/03/2019 0827   Sepsis Labs Invalid input(s): PROCALCITONIN,  WBC,  LACTICIDVEN Microbiology Recent Results (from the past 240 hour(s))  Surgical pcr screen     Status: None   Collection Time: 06/01/19 10:02 PM   Specimen: Nasal Mucosa; Nasal Swab  Result Value Ref Range Status   MRSA, PCR NEGATIVE NEGATIVE Final   Staphylococcus aureus NEGATIVE NEGATIVE Final    Comment: (NOTE) The Xpert SA Assay (FDA approved for NASAL specimens in patients 10 years of age and older), is one component of a comprehensive surveillance program. It is not intended to diagnose infection nor to guide or monitor treatment. Performed at [facility], [place] [address], [place], [place] [address]   SARS CORONAVIRUS 2 (TAT 6-24 HRS) Nasopharyngeal Nasopharyngeal Swab     Status: None   Collection Time: 06/07/19  1:01 PM   Specimen: Nasopharyngeal Swab  Result Value Ref Range Status   SARS Coronavirus 2 NEGATIVE NEGATIVE Final    Comment: (NOTE) SARS-CoV-2 target nucleic acids are NOT DETECTED. The SARS-CoV-2 RNA is generally detectable in upper and lower respiratory specimens during the acute phase of infection. Negative results do not preclude SARS-CoV-2 infection, do not rule out co-infections with other pathogens, and should not be used as the sole basis for treatment or other patient  management decisions. Negative results must be combined with clinical observations, patient history, and epidemiological information. The expected result is Negative. Fact Sheet for Patients: SugarRoll.be Fact Sheet for Healthcare Providers: https://www.woods-mathews.com/ This test is not yet approved or cleared by the [place] FDA and  has been authorized for detection and/or diagnosis of SARS-CoV-2 by FDA under an Emergency Use Authorization (EUA). This EUA will remain  in effect (meaning this test can be used) for the duration of the COVID-19 declaration under Section 56 4(b)(1) of the Act, 21 U.S.C. section 360bbb-3(b)(1), unless the authorization is terminated or revoked sooner. Performed at [facility], [place] [address], [place], [place] [address]      Time coordinating discharge: Over 30 minutes  SIGNED:   Darliss Cheney, MD  [facility] 06/08/2019, 10:18 AM Pager [phone]  If 7PM-7AM, please contact night-coverage [email or web address] Password [email or web address]

## 2019-06-08 NOTE — Progress Notes (Signed)
AVS, signed printed prescriptions, social worker's paperwork, and signed DNR form placed in discharge packet. Report called and given to Nia, LPN at [facility]. All questions answered to satisfaction. [facility] to transport pt with all belongings.

## 2019-06-08 NOTE — Progress Notes (Signed)
6 Days Post-Op   Subjective/Chief Complaint: Pt states she feels much better today, and has no complaints except for her right arm being sore. Pt denies abdominal pain, nausea, and vomiting. Pt also denies diarrhea since yesterday. Pt has ben eating solid foods and tolerating well. She has been able to ambulate to the chair with assistance.   Objective: Vital signs in last 24 hours: Temp:  [97.5 F (36.4 C)-98 F (36.7 C)] 97.5 F (36.4 C) (09/22 0451) Pulse Rate:  [66-69] 66 (09/22 0451) Resp:  [16-20] 18 (09/22 0451) BP: (139-154)/(53-88) 149/88 (09/22 0451) SpO2:  [97 %-100 %] 98 % (09/22 0451) Weight:  [70.4 kg] 70.4 kg (09/22 0500) Last BM Date: 06/07/19  Intake/Output from previous day: 09/21 0701 - 09/22 0700 In: 224.6 [P.O.:100; I.V.:124.6] Out: 680 [Urine:600; Drains:80] Intake/Output this shift: No intake/output data recorded.  General: Pt is pleasant and in no acute distress  HEENT: Neck supple, no lymphadenopathy CV: Regular rate and rhythm Pulm: clear to auscultation in all fields  Abdomen: Multiple laparoscopic incisions healing well, abdomen is soft and nontender to palpation.  Normoactive BS.  Extremities: pulses present, still edematous.    Lab Results:  Recent Labs    06/06/19 0507 06/07/19 0331  WBC 11.2* 10.5  HGB 8.4* 8.2*  HCT 27.7* 25.6*  PLT 426* 472*   BMET Recent Labs    06/06/19 0507 06/07/19 0331  NA 139 141  K 5.4* 4.4  CL 115* 118*  CO2 13* 14*  GLUCOSE 95 90  BUN 54* 54*  CREATININE 3.21* 3.11*  CALCIUM 7.2* 7.2*   PT/INR No results for input(s): LABPROT, INR in the last 72 hours. ABG No results for input(s): PHART, HCO3 in the last 72 hours.  Invalid input(s): PCO2, PO2  Studies/Results: Vas [place] Upper Extremity Venous Duplex  Result Date: 06/07/2019 UPPER VENOUS STUDY  Indications: Edema Comparison Study: No prior study on file for comparison Performing Technologist: Sharion Dove RVS  Examination Guidelines: A  complete evaluation includes B-mode imaging, spectral Doppler, color Doppler, and power Doppler as needed of all accessible portions of each vessel. Bilateral testing is considered an integral part of a complete examination. Limited examinations for reoccurring indications may be performed as noted.  Right Findings: +----------+------------+---------+-----------+----------+-------+ RIGHT     CompressiblePhasicitySpontaneousPropertiesSummary +----------+------------+---------+-----------+----------+-------+ Subclavian    Full       Yes       Yes                      +----------+------------+---------+-----------+----------+-------+  Left Findings: +----------+------------+---------+-----------+----------+-------+ LEFT      CompressiblePhasicitySpontaneousPropertiesSummary +----------+------------+---------+-----------+----------+-------+ IJV           Full       Yes       Yes                      +----------+------------+---------+-----------+----------+-------+ Subclavian    Full       Yes       Yes                      +----------+------------+---------+-----------+----------+-------+ Axillary      Full       Yes       Yes                      +----------+------------+---------+-----------+----------+-------+ Brachial      Full       Yes       Yes                      +----------+------------+---------+-----------+----------+-------+  Cephalic      None                                   Acute  +----------+------------+---------+-----------+----------+-------+ Basilic       Full                                          +----------+------------+---------+-----------+----------+-------+ Cephalic thrombus extends from IV site to just above AC.  Summary:  Right: No evidence of thrombosis in the subclavian.  Left: No evidence of deep vein thrombosis in the upper extremity. Findings consistent with acute superficial vein thrombosis involving the left cephalic  vein.  *See table(s) above for measurements and observations.  Diagnosing physician: Monica Martinez MD Electronically signed by Monica Martinez MD on 06/07/2019 at 4:22:42 PM.    Final     Anti-infectives: Anti-infectives (From admission, onward)   Start     Dose/Rate Route Frequency Ordered Stop   06/03/19 0930  cefTRIAXone (ROCEPHIN) 2 g in sodium chloride 0.9 % 100 mL IVPB  Status:  Discontinued     2 g 200 mL/hr over 30 Minutes Intravenous Every 24 hours 06/03/19 0926 06/07/19 0956   06/03/19 0930  metroNIDAZOLE (FLAGYL) IVPB 500 mg  Status:  Discontinued     500 mg 100 mL/hr over 60 Minutes Intravenous Every 8 hours 06/03/19 0926 06/07/19 0956   05/28/19 0100  piperacillin-tazobactam (ZOSYN) IVPB 2.25 g  Status:  Discontinued     2.25 g 100 mL/hr over 30 Minutes Intravenous Every 8 hours 05/27/19 1831 06/03/19 0925   05/27/19 1815  piperacillin-tazobactam (ZOSYN) IVPB 3.375 g     3.375 g 100 mL/hr over 30 Minutes Intravenous  Once 05/27/19 1814 05/27/19 2048   05/27/19 1630  cefTRIAXone (ROCEPHIN) 1 g in sodium chloride 0.9 % 100 mL IVPB     1 g 200 mL/hr over 30 Minutes Intravenous  Once 05/27/19 1628 05/27/19 1849   05/27/19 1630  metroNIDAZOLE (FLAGYL) IVPB 500 mg     500 mg 100 mL/hr over 60 Minutes Intravenous  Once 05/27/19 1628 05/27/19 2111      Assessment/Plan: POD 6 laparoscopic appendectomy- Dr. Lucia Gaskins   FEN: IVF and solid foods  ID: discontinue  DVT: Lovenox Continue to ambulate and build strength.  LOS: 12 days    Renato Gails Kati Riggenbach 06/08/2019

## 2019-06-08 NOTE — TOC Transition Note (Addendum)
Transition of Care [facility]) - CM/SW Discharge Note   Patient Details  Name: Mary Guzman MRN: [id number] Date of Birth: 06-15-1921  [facility] CM/SW Contact:  Alexander Mt, [place] Phone Number: 06/08/2019, 2:35 PM   Clinical Narrative:    Pt stable for d/c and paperwork placed on chart. Auth received from [facility] for SNF [id number].  Pt daughter and pt aware. Pt ambulance form faxed to [facility]  (approval for ambulance received 9/22 at 4:40pm auth #[id number]). [facility] called for 2:30pm  Final next level of care: Skilled Nursing Facility Barriers to Discharge: Barriers Resolved   Patient Goals and CMS Choice Patient states their goals for this hospitalization and ongoing recovery are:: for her to get stronger and be able to do for herself again CMS Medicare.gov Compare Post Acute Care list provided to:: Patient Represenative (must comment)(pt daughter) Choice offered to / list presented to : Adult Children  Discharge Placement   Existing PASRR number confirmed : 05/31/19          Patient chooses bed at: [facility] Patient to be transferred to facility by: [facility] Name of family member notified: pt and pt daughter [name] at bedside Patient and family notified of of transfer: 06/08/19  Discharge Plan and Services In-house Referral: Clinical Social Work Discharge Planning Services: CM Consult  Social Determinants of Health (SDOH) Interventions     Readmission Risk Interventions No flowsheet data found.

## 2019-06-08 NOTE — Discharge Instructions (Signed)
Appendicitis, Adult  The appendix is a tube in the body that is shaped like a finger. It is attached to the large intestine. Appendicitis means that this tube is swollen (inflamed). If this is not treated, the tube can tear (rupture). This can lead to a life-threatening infection. This condition can also cause pus to build up in the appendix (abscess). What are the causes? This condition may be caused by something that blocks the appendix. These include:  A ball of poop (stool).  Lymph glands that are bigger than normal. Sometimes the cause is not known. What increases the risk? You are more likely to develop this condition if you are between 10 and 30 years of age. What are the signs or symptoms? Symptoms of this condition include:  Pain around the belly button (navel). ? The pain moves toward the lower right belly (abdomen). ? The pain can get worse with time. ? The pain can get worse if you cough. ? The pain can get worse if you move suddenly.  Tenderness in the lower right belly.  Feeling sick to your stomach (nauseous).  Throwing up (vomiting).  Not feeling hungry (loss of appetite).  A fever.  Having trouble pooping (constipation).  Watery poop (diarrhea).  Not feeling well. How is this treated? Most often, this condition is treated by taking out the appendix (appendectomy). There are two ways to do this:  Open surgery. For this method, the appendix is taken out through a large cut (incision). The cut is made in the lower right belly. This surgery may be used if: ? You have scars from another surgery. ? You have a bleeding condition. ? You are pregnant and will be having your baby soon. ? You have a condition that makes it hard to do the other type of surgery.  Laparoscopic surgery. For this method, the appendix is taken out through small cuts. Often, this surgery: ? Causes less pain. ? Causes fewer problems. ? Is easier to heal from. If your appendix tears and  pus forms:  A drain may be put into the sore. The drain will be used to get rid of the pus.  You may get an antibiotic medicine through an IV line.  Your appendix may or may not need to be taken out. Follow these instructions at home: If you had surgery, follow instructions from your doctor on how to care for yourself at home and how to take care of your cut from surgery. Medicines  Take over-the-counter and prescription medicines only as told by your doctor.  If you were prescribed an antibiotic medicine, take it as told by your doctor. Do not stop taking the antibiotic even if you start to feel better. Eating and drinking Follow instructions from your doctor about what you cannot eat or drink. You may go back to your diet slowly if:  You no longer feel sick to your stomach.  You have stopped throwing up. General instructions  Do not use any products that contain nicotine or tobacco, such as cigarettes, e-cigarettes, and chewing tobacco. If you need help quitting, ask your doctor.  Do not drive or use heavy machinery while taking prescription pain medicine.  Ask your doctor if the medicine you are taking can cause trouble pooping. You may need to take steps to prevent or treat trouble pooping: ? Drink enough fluid to keep your pee (urine) pale yellow. ? Take over-the-counter or prescription medicines. ? Eat foods that are high in fiber. These include beans,   whole grains, and fresh fruits and vegetables. ? Limit foods that are high in fat and sugar. These include fried or sweet foods.  Keep all follow-up visits as told by your doctor. This is important. Contact a doctor if:  There is pus, blood, or a lot of fluid coming from your cut or cuts from surgery.  You are sick to your stomach or you throw up. Get help right away if:  You have pain in your belly, and the pain is getting worse.  You have a fever.  You have chills.  You are very tired.  You have muscle pain.   You are short of breath. Summary  Appendicitis is swelling of the appendix. The appendix is a tube that is shaped like a finger. It is joined to the large intestine.  This condition may be caused by something that blocks the appendix. This can lead to an infection.  This condition is usually treated by taking out the appendix. This information is not intended to replace advice given to you by your health care provider. Make sure you discuss any questions you have with your health care provider. Document Released: 11/25/2011 Document Revised: 02/18/2018 Document Reviewed: 02/18/2018 Elsevier Patient Education  2020 Elsevier Inc.  

## 2019-06-08 NOTE — Plan of Care (Signed)

## 2019-06-08 NOTE — Social Work (Signed)
Clinical Social Worker facilitated patient discharge including contacting patient family and facility to confirm patient discharge plans.  Clinical information faxed to facility and family agreeable with plan.  CSW arranged ambulance transport via [facility] to [facility] at 2:30pm. RN to call [phone] with report prior to discharge.  Clinical Social Worker will sign off for now as social work intervention is no longer needed. Please consult [place] again if new need arises.  Westley Hummer, MSW, [place] Social Worker [phone]

## 2019-06-09 ENCOUNTER — Telehealth: Payer: Self-pay | Admitting: Hematology and Oncology

## 2019-06-09 DIAGNOSIS — I5022 Chronic systolic (congestive) heart failure: Secondary | ICD-10-CM | POA: Diagnosis not present

## 2019-06-09 DIAGNOSIS — K3533 Acute appendicitis with perforation and localized peritonitis, with abscess: Secondary | ICD-10-CM | POA: Diagnosis not present

## 2019-06-09 DIAGNOSIS — S42291D Other displaced fracture of upper end of right humerus, subsequent encounter for fracture with routine healing: Secondary | ICD-10-CM | POA: Diagnosis not present

## 2019-06-09 DIAGNOSIS — R55 Syncope and collapse: Secondary | ICD-10-CM | POA: Diagnosis not present

## 2019-06-09 NOTE — Telephone Encounter (Signed)
Returned patient's phone call regarding rescheduling an appointment, left a voicemail. 

## 2019-06-10 ENCOUNTER — Telehealth: Payer: Self-pay | Admitting: Hematology and Oncology

## 2019-06-10 NOTE — Telephone Encounter (Signed)
Patient called to reschedule 10/02 appointment, per patient's request appointment has been moved to 10/26 due to patient recovering from surgery and being in quarantine.   Message to provider.

## 2019-06-11 DIAGNOSIS — E039 Hypothyroidism, unspecified: Secondary | ICD-10-CM | POA: Diagnosis not present

## 2019-06-11 DIAGNOSIS — I10 Essential (primary) hypertension: Secondary | ICD-10-CM | POA: Diagnosis not present

## 2019-06-11 DIAGNOSIS — Z9049 Acquired absence of other specified parts of digestive tract: Secondary | ICD-10-CM | POA: Diagnosis not present

## 2019-06-11 DIAGNOSIS — S42301A Unspecified fracture of shaft of humerus, right arm, initial encounter for closed fracture: Secondary | ICD-10-CM | POA: Diagnosis not present

## 2019-06-11 DIAGNOSIS — I509 Heart failure, unspecified: Secondary | ICD-10-CM | POA: Diagnosis not present

## 2019-06-11 DIAGNOSIS — N189 Chronic kidney disease, unspecified: Secondary | ICD-10-CM | POA: Diagnosis not present

## 2019-06-12 DIAGNOSIS — I5022 Chronic systolic (congestive) heart failure: Secondary | ICD-10-CM | POA: Diagnosis not present

## 2019-06-12 DIAGNOSIS — I10 Essential (primary) hypertension: Secondary | ICD-10-CM | POA: Diagnosis not present

## 2019-06-12 DIAGNOSIS — R197 Diarrhea, unspecified: Secondary | ICD-10-CM | POA: Diagnosis not present

## 2019-06-12 DIAGNOSIS — D631 Anemia in chronic kidney disease: Secondary | ICD-10-CM | POA: Diagnosis not present

## 2019-06-12 DIAGNOSIS — M150 Primary generalized (osteo)arthritis: Secondary | ICD-10-CM | POA: Diagnosis not present

## 2019-06-12 DIAGNOSIS — R55 Syncope and collapse: Secondary | ICD-10-CM | POA: Diagnosis not present

## 2019-06-12 DIAGNOSIS — E038 Other specified hypothyroidism: Secondary | ICD-10-CM | POA: Diagnosis not present

## 2019-06-12 DIAGNOSIS — K219 Gastro-esophageal reflux disease without esophagitis: Secondary | ICD-10-CM | POA: Diagnosis not present

## 2019-06-12 DIAGNOSIS — S42291D Other displaced fracture of upper end of right humerus, subsequent encounter for fracture with routine healing: Secondary | ICD-10-CM | POA: Diagnosis not present

## 2019-06-12 DIAGNOSIS — N183 Chronic kidney disease, stage 3 (moderate): Secondary | ICD-10-CM | POA: Diagnosis not present

## 2019-06-12 DIAGNOSIS — K3533 Acute appendicitis with perforation and localized peritonitis, with abscess: Secondary | ICD-10-CM | POA: Diagnosis not present

## 2019-06-16 DIAGNOSIS — I5022 Chronic systolic (congestive) heart failure: Secondary | ICD-10-CM | POA: Diagnosis not present

## 2019-06-16 DIAGNOSIS — K3533 Acute appendicitis with perforation and localized peritonitis, with abscess: Secondary | ICD-10-CM | POA: Diagnosis not present

## 2019-06-16 DIAGNOSIS — S42291D Other displaced fracture of upper end of right humerus, subsequent encounter for fracture with routine healing: Secondary | ICD-10-CM | POA: Diagnosis not present

## 2019-06-16 DIAGNOSIS — N183 Chronic kidney disease, stage 3 (moderate): Secondary | ICD-10-CM | POA: Diagnosis not present

## 2019-06-17 ENCOUNTER — Other Ambulatory Visit: Payer: PPO

## 2019-06-17 ENCOUNTER — Ambulatory Visit: Payer: PPO

## 2019-06-18 ENCOUNTER — Ambulatory Visit: Payer: PPO

## 2019-06-18 ENCOUNTER — Other Ambulatory Visit: Payer: PPO

## 2019-06-18 DIAGNOSIS — E039 Hypothyroidism, unspecified: Secondary | ICD-10-CM | POA: Diagnosis not present

## 2019-06-18 DIAGNOSIS — D649 Anemia, unspecified: Secondary | ICD-10-CM | POA: Diagnosis not present

## 2019-06-18 DIAGNOSIS — M6281 Muscle weakness (generalized): Secondary | ICD-10-CM | POA: Diagnosis not present

## 2019-06-18 DIAGNOSIS — S42301S Unspecified fracture of shaft of humerus, right arm, sequela: Secondary | ICD-10-CM | POA: Diagnosis not present

## 2019-06-22 ENCOUNTER — Telehealth: Payer: Self-pay | Admitting: Adult Health

## 2019-06-22 NOTE — Telephone Encounter (Signed)
Left a message for a return call.

## 2019-06-22 NOTE — Telephone Encounter (Signed)
Dwight notified to send paper work.

## 2019-06-22 NOTE — Telephone Encounter (Signed)
Patient Son Mary Guzman called in to get FMLA paperwork filled out by Dr Carlisle Cater, Tommi Rumps . Please advise

## 2019-06-23 DIAGNOSIS — K3533 Acute appendicitis with perforation and localized peritonitis, with abscess: Secondary | ICD-10-CM | POA: Diagnosis not present

## 2019-06-23 DIAGNOSIS — E038 Other specified hypothyroidism: Secondary | ICD-10-CM | POA: Diagnosis not present

## 2019-06-23 DIAGNOSIS — S42291D Other displaced fracture of upper end of right humerus, subsequent encounter for fracture with routine healing: Secondary | ICD-10-CM | POA: Diagnosis not present

## 2019-06-23 DIAGNOSIS — I5022 Chronic systolic (congestive) heart failure: Secondary | ICD-10-CM | POA: Diagnosis not present

## 2019-06-24 ENCOUNTER — Other Ambulatory Visit (HOSPITAL_COMMUNITY): Payer: Self-pay | Admitting: Cardiology

## 2019-06-24 NOTE — Telephone Encounter (Signed)
Form received and sent to [facility].  Waiting on signature.

## 2019-06-25 NOTE — Telephone Encounter (Signed)
Dwight notified to pick up at the front desk.  Copy sent to scan.  Nothing further needed.

## 2019-06-27 DIAGNOSIS — I10 Essential (primary) hypertension: Secondary | ICD-10-CM | POA: Diagnosis not present

## 2019-06-27 DIAGNOSIS — I5022 Chronic systolic (congestive) heart failure: Secondary | ICD-10-CM | POA: Diagnosis not present

## 2019-06-27 DIAGNOSIS — M150 Primary generalized (osteo)arthritis: Secondary | ICD-10-CM | POA: Diagnosis not present

## 2019-06-27 DIAGNOSIS — N183 Chronic kidney disease, stage 3 unspecified: Secondary | ICD-10-CM | POA: Diagnosis not present

## 2019-06-27 DIAGNOSIS — R55 Syncope and collapse: Secondary | ICD-10-CM | POA: Diagnosis not present

## 2019-06-27 DIAGNOSIS — K3533 Acute appendicitis with perforation and localized peritonitis, with abscess: Secondary | ICD-10-CM | POA: Diagnosis not present

## 2019-06-27 DIAGNOSIS — S42291D Other displaced fracture of upper end of right humerus, subsequent encounter for fracture with routine healing: Secondary | ICD-10-CM | POA: Diagnosis not present

## 2019-06-29 ENCOUNTER — Encounter: Payer: PPO | Admitting: Adult Health

## 2019-06-29 NOTE — Telephone Encounter (Signed)
Tried reaching [place].  Received a busy tone X 3.  Will try again at a later time.

## 2019-06-29 NOTE — Telephone Encounter (Signed)
Patient's son states that paperwork was faxed back to office from [place], as they stated that there was missing information. Patient would like to know if office received fax.

## 2019-06-30 NOTE — Telephone Encounter (Signed)
Left a message for a return call.

## 2019-07-01 NOTE — Telephone Encounter (Signed)
Tried calling Mary Guzman 2.  Received a busy signal each time.

## 2019-07-01 NOTE — Telephone Encounter (Signed)
Patient's son Orpah Greek is calling to Speak to [facility] regarding FMLA paper work [phone]

## 2019-07-02 DIAGNOSIS — S42291D Other displaced fracture of upper end of right humerus, subsequent encounter for fracture with routine healing: Secondary | ICD-10-CM | POA: Diagnosis not present

## 2019-07-02 DIAGNOSIS — I5022 Chronic systolic (congestive) heart failure: Secondary | ICD-10-CM | POA: Diagnosis not present

## 2019-07-02 DIAGNOSIS — R634 Abnormal weight loss: Secondary | ICD-10-CM | POA: Diagnosis not present

## 2019-07-02 DIAGNOSIS — K3533 Acute appendicitis with perforation and localized peritonitis, with abscess: Secondary | ICD-10-CM | POA: Diagnosis not present

## 2019-07-02 NOTE — Telephone Encounter (Signed)
Left a message for a return call.

## 2019-07-03 DIAGNOSIS — I10 Essential (primary) hypertension: Secondary | ICD-10-CM | POA: Diagnosis not present

## 2019-07-03 DIAGNOSIS — I5022 Chronic systolic (congestive) heart failure: Secondary | ICD-10-CM | POA: Diagnosis not present

## 2019-07-03 DIAGNOSIS — S42291D Other displaced fracture of upper end of right humerus, subsequent encounter for fracture with routine healing: Secondary | ICD-10-CM | POA: Diagnosis not present

## 2019-07-03 DIAGNOSIS — R634 Abnormal weight loss: Secondary | ICD-10-CM | POA: Diagnosis not present

## 2019-07-03 DIAGNOSIS — K3533 Acute appendicitis with perforation and localized peritonitis, with abscess: Secondary | ICD-10-CM | POA: Diagnosis not present

## 2019-07-06 ENCOUNTER — Ambulatory Visit (HOSPITAL_COMMUNITY)
Admission: RE | Admit: 2019-07-06 | Discharge: 2019-07-06 | Disposition: A | Discharge status: Home / Self Care | Payer: PPO | Source: Ambulatory Visit | Attending: Cardiology | Admitting: Cardiology

## 2019-07-06 ENCOUNTER — Encounter (HOSPITAL_COMMUNITY): Payer: Self-pay | Admitting: Cardiology

## 2019-07-06 ENCOUNTER — Other Ambulatory Visit: Payer: Self-pay

## 2019-07-06 VITALS — BP 131/59 | HR 51 | Wt 134.0 lb

## 2019-07-06 DIAGNOSIS — I130 Hypertensive heart and chronic kidney disease with heart failure and stage 1 through stage 4 chronic kidney disease, or unspecified chronic kidney disease: Secondary | ICD-10-CM | POA: Diagnosis not present

## 2019-07-06 DIAGNOSIS — Z7982 Long term (current) use of aspirin: Secondary | ICD-10-CM | POA: Diagnosis not present

## 2019-07-06 DIAGNOSIS — Z79899 Other long term (current) drug therapy: Secondary | ICD-10-CM | POA: Insufficient documentation

## 2019-07-06 DIAGNOSIS — Z853 Personal history of malignant neoplasm of breast: Secondary | ICD-10-CM | POA: Insufficient documentation

## 2019-07-06 DIAGNOSIS — N184 Chronic kidney disease, stage 4 (severe): Secondary | ICD-10-CM | POA: Diagnosis not present

## 2019-07-06 DIAGNOSIS — I5022 Chronic systolic (congestive) heart failure: Secondary | ICD-10-CM | POA: Diagnosis not present

## 2019-07-06 DIAGNOSIS — E039 Hypothyroidism, unspecified: Secondary | ICD-10-CM | POA: Diagnosis not present

## 2019-07-06 DIAGNOSIS — I447 Left bundle-branch block, unspecified: Secondary | ICD-10-CM | POA: Insufficient documentation

## 2019-07-06 DIAGNOSIS — Z79890 Hormone replacement therapy (postmenopausal): Secondary | ICD-10-CM | POA: Diagnosis not present

## 2019-07-06 DIAGNOSIS — I5032 Chronic diastolic (congestive) heart failure: Secondary | ICD-10-CM

## 2019-07-06 DIAGNOSIS — K219 Gastro-esophageal reflux disease without esophagitis: Secondary | ICD-10-CM | POA: Insufficient documentation

## 2019-07-06 LAB — BASIC METABOLIC PANEL
Anion gap: 12 (ref 5–15)
BUN: 37 mg/dL — ABNORMAL HIGH (ref 8–23)
CO2: 15 mmol/L — ABNORMAL LOW (ref 22–32)
Calcium: 8.4 mg/dL — ABNORMAL LOW (ref 8.9–10.3)
Chloride: 110 mmol/L (ref 98–111)
Creatinine, Ser: 2.46 mg/dL — ABNORMAL HIGH (ref 0.44–1.00)
GFR calc Af Amer: 19 mL/min — ABNORMAL LOW (ref >60)
GFR calc non Af Amer: 16 mL/min — ABNORMAL LOW (ref >60)
Glucose, Bld: 92 mg/dL (ref 70–99)
Potassium: 4.5 mmol/L (ref 3.5–5.1)
Sodium: 137 mmol/L (ref 135–145)

## 2019-07-06 NOTE — Telephone Encounter (Signed)
Patient's son Orpah Greek is calling to Speak to [facility] when it is convenient for her. He will be waiting on the call. Ph#  [phone]

## 2019-07-06 NOTE — Patient Instructions (Addendum)
Take Lasix 40mg  (1 tab) twice a day for 4 days, THEN resume Lasix 40mg  (1 tab) daily  Wear compression stockings during the day  Labs today and repeat in 10 days.  Please have the nursing draw your blood and fax results to [phone] We will only contact you if something comes back abnormal or we need to make some changes. Otherwise no news is good news!  Your physician recommends that you schedule a follow-up appointment in: 3 weeks with the Nurse Practitioner.   At the [facility], you and your health needs are our priority. As part of our continuing mission to provide you with exceptional heart care, we have created designated Provider Care Teams. These Care Teams include your primary Cardiologist (physician) and Advanced Practice Providers (APPs- Physician Assistants and Nurse Practitioners) who all work together to provide you with the care you need, when you need it.   You may see any of the following providers on your designated Care Team at your next follow up: Marland Kitchen Dr Glori Bickers . Dr Loralie Champagne . Darrick Grinder, NP . Lyda Jester, PA   Please be sure to bring in all your medications bottles to every appointment.

## 2019-07-07 ENCOUNTER — Encounter: Payer: Self-pay | Admitting: Family Medicine

## 2019-07-07 NOTE — Progress Notes (Signed)
Date:  07/07/2019   ID:  Mary Guzman, DOB [birth year], MRN [id number]   Provider location: [place] [facility] Type of Visit: Established patient  PCP:  [name], NP  Cardiologist: Dr. Aundra Dubin  Chief Complaint: Fatigue   History of Present Illness: Mary Guzman is a 85 y.o. female who has a history of breast cancer in the 1990s, HTN, CKD stage 3-4 and CHF. She lives alone but daughter helps.  She dates her symptoms to around the time when she developed severe hip pain in 8/16. She was under a lot of stress with the severe pain and ended up getting steroid injections.  She developed exertional dyspnea around that time.  However, for > 1 year she has had significant lower extremity edema.  She got to the point where she was short of breath after walking about 15-20 feet.  No chest pain.  CXR in 9/16 showed mild pulmonary edema.  This improved with increase in Lasix to 40 qam and every other day 20 mg pm Lasix.  Repeat echo was done in 11/17.  This showed improvement in EF to 55%.  Echo in 11/18 showed that EF remains 55%.   Seen in ER 05/2018 with hyperkalemia and anemia.   In 9/20, she was admitted after tripping and falling with a right humeral fracture. Later in 9/20, she developed acute appendicitis and had an appendectomy.  She tolerated this well.   She is now at [employer].  After her appendectomy, she gained significant weight.  She says that she was not getting Lasix while in the hospital and gained about 20 lbs.  She is now back on Lasix 40 mg daily and has lost some weight.  She still has significant lower extremity edema.  She is doing some walking with PT, can walk in the hall with PT without dyspnea. She is using her wheelchair most of the time.  No orthopnea/PND.  No chest pain.   ECG (personally reviewed): NSR, LBBB (144 msec)  REDS clip 28%  Labs (9/16): pro-BNP > 5000, TSH normal Labs (10/16): K 4.5, creatinine 1.68 Labs (11/16): K 4.7,  creatinine 1.67 Labs (12/16): K 4.9, creatinine 1.86 Labs (5/17): K 4.9, creatinine 2.1, BNP 373, TSH 0.98 (normal) Labs 01/25/2016: K 4.5 Creatinine 2.37 Labs (6/17): K 4.2, creatinine 2.16 Labs (11/17): K 4.6, creatinine 2.39, HCT 33.7 Labs (5/18): BNP 282, K 4.8, creatinine 3 Labs (11/18): K 4.4, creatinine 3.11 Labs (12/18): TSH normal Labs (10/19): K 4.4, creatinine 2.65 Labs (11/19): hgb 11.1, K 4.7, creatinine 2.42 Labs (1/20): hgb 12.3 Labs (2/20): K 4.7, creatinine 2.24 Labs (5/20): hgb 10.9 Labs (9/20): K 4.4, creatinine 3  PMH: 1. Breast cancer: On left, s/p mastectomy in 1994 along with radiation treatment.  No chemotherapy.  2. Hypothyroidism. 3. OA 4. HTN 5. GERD 6. CKD: Stage 4.  7. Chronic systolic CHF: Echo (37/85) with EF 20-25%, diffuse hypokinesis, mild MR, moderately decreased RV systolic function, PASP 60 mmHg.  - Echo (11/17): EF 55%, septal-lateral dyssynchrony, normal RV size and systolic function.  - Echo (11/18): EF 55%, moderate LVH, septal-lateral dyssynchrony, normal RV size and systolic function.  8. Chronic LBBB 9. Appendectomy 9/20  Past Surgical History:  Procedure Laterality Date  . ANKLE SURGERY     Right  . INTRACAPSULAR CATARACT EXTRACTION    . LAPAROSCOPIC APPENDECTOMY N/A 06/02/2019   Procedure: APPENDECTOMY LAPAROSCOPIC;  Surgeon: Alphonsa Overall, MD;  Location: [place];  Service: General;  Laterality:  N/A;  . MASTECTOMY  1994     Current Outpatient Medications  Medication Sig Dispense Refill  . acetaminophen (TYLENOL) 500 MG tablet Take 500 mg by mouth every 6 (six) hours as needed for mild pain.     Marland Kitchen amLODipine (NORVASC) 10 MG tablet Take 10 mg by mouth daily.    Marland Kitchen aspirin EC 81 MG tablet Take 1 tablet (81 mg total) by mouth daily. 30 tablet 6  . carboxymethylcellulose (REFRESH PLUS) 0.5 % SOLN Place 1 drop into both eyes daily as needed (dry eye).     . Cholecalciferol (VITAMIN D3) 5000 UNITS CAPS Take 1 capsule by mouth daily.     . diphenoxylate-atropine (LOMOTIL) 2.5-0.025 MG/5ML liquid Take 10 mLs by mouth 4 (four) times daily as needed for diarrhea or loose stools. 60 mL 0  . famotidine-calcium carbonate-magnesium hydroxide (PEPCID COMPLETE) 10-800-165 MG chewable tablet Chew 1 tablet by mouth daily as needed.    . furosemide (LASIX) 40 MG tablet Take 40 mg by mouth daily.    Marland Kitchen levothyroxine (SYNTHROID) 100 MCG tablet Take 100 mcg by mouth daily before breakfast.    . loperamide (IMODIUM A-D) 2 MG tablet Take 2 mg by mouth 4 (four) times daily as needed for diarrhea or loose stools.    . metoprolol succinate (TOPROL-XL) 25 MG 24 hr tablet TAKE 1 TABLET(25 MG) BY MOUTH TWICE DAILY 180 tablet 1  . Multiple Vitamins-Minerals (PRESERVISION AREDS 2 PO) Take 2 tablets by mouth daily.     . traMADol-acetaminophen (ULTRACET) 37.5-325 MG tablet Take 1 tablet by mouth every 6 (six) hours as needed. 10 tablet 0   No current facility-administered medications for this encounter.     Allergies:   Patient has no known allergies.   Social History:  The patient  reports that she has never smoked. Her smokeless tobacco use includes chew. She reports that she does not drink alcohol.   Family History:  The patient's family history includes Alcohol abuse in her brother; Asthma in her son.   ROS:  Please see the history of present illness.   All other systems are personally reviewed and negative.   Exam:  BP (!) 131/59   Pulse (!) 51   Wt 60.8 kg (134 lb)   SpO2 100%   BMI 22.99 kg/m  General: NAD Neck: No JVD, no thyromegaly or thyroid nodule.  Lungs: Clear to auscultation bilaterally with normal respiratory effort. CV: Nondisplaced PMI.  Heart regular S1/S2 with paradoxically split S2, no S3/S4, no murmur.  2+ edema to knees bilaterally.  No carotid bruit.  Normal pedal pulses.  Abdomen: Soft, nontender, no hepatosplenomegaly, no distention.  Skin: Intact without lesions or rashes.  Neurologic: Alert and oriented x 3.   Psych: Normal affect. Extremities: No clubbing or cyanosis.  HEENT: Normal.    Recent Labs: 06/03/2019: ALT 9 06/08/2019: Hemoglobin 8.3; Platelets 571 07/06/2019: BUN 37; Creatinine, Ser 2.46; Potassium 4.5; Sodium 137  Personally reviewed   Wt Readings from Last 3 Encounters:  07/06/19 60.8 kg (134 lb)  06/08/19 70.4 kg (155 lb 3.3 oz)  03/18/19 57.8 kg (127 lb 6.4 oz)      ASSESSMENT AND PLAN:  1. Chronic systolic CHF: EF 37-62% with moderate RV dysfunction on echo in 10/16.  Etiology of the cardiomyopathy is uncertain: no chest pain or suggestion of CAD, however cannot rule out ischemic cardiomyopathy.  Symptoms noted at a time of significant stress with severe hip pain in 8/16, so cannot rule out Takotsubo  CMP. Viral myocarditis remains possible.  Echo in 11/17 showed recovery of EF to 55%.  11/18 echo showed that EF remained 55%. Therefore, it is possible that the original insult was a stress (Takotsubo-type) cardiomyopathy.  Currently, she is not very active, probably NYHA class II-III symptoms.  She has significant peripheral edema but no definite JVD, and REDS clip only measures 28%.  - With marked peripheral edema, I will increase Lasix to 40 mg bid for 4 days then back to 40 mg daily.  BMET today and again in 10 days.  - Wear compression stockings during the day.  - Continue Toprol XL 25 mg bid.  - Stay off ACEI/ARB/ARNI/spironolactone with CKD IV.  2. CKD: Stage IV. She follows with nephrology.  Creatinine recently around 3.  BMET today.   3. HTN: BP controlled.  - Continue amlodipine 10 mg daily.  Followup in 3 wks.    Signed, Loralie Champagne, MD  07/07/2019   [facility] [address] [facility] [place] [address] [phone] (office) [phone] (fax)

## 2019-07-07 NOTE — Telephone Encounter (Signed)
Left a message on machine.  Need new FMLA paper work faxed to office.

## 2019-07-07 NOTE — Telephone Encounter (Signed)
error 

## 2019-07-08 ENCOUNTER — Telehealth: Payer: Self-pay

## 2019-07-08 DIAGNOSIS — S42291D Other displaced fracture of upper end of right humerus, subsequent encounter for fracture with routine healing: Secondary | ICD-10-CM | POA: Diagnosis not present

## 2019-07-08 DIAGNOSIS — I5022 Chronic systolic (congestive) heart failure: Secondary | ICD-10-CM | POA: Diagnosis not present

## 2019-07-08 DIAGNOSIS — K3533 Acute appendicitis with perforation and localized peritonitis, with abscess: Secondary | ICD-10-CM | POA: Diagnosis not present

## 2019-07-08 DIAGNOSIS — N183 Chronic kidney disease, stage 3 unspecified: Secondary | ICD-10-CM | POA: Diagnosis not present

## 2019-07-08 NOTE — Telephone Encounter (Signed)
Copied from [place] [phone]. Topic: General - Other >> Jul 07, 2019 12:29 PM Alanda Slim E wrote: Reason for CRM: Alika Eppes called to speak with [facility] about his FMLA paperwork/ please give him a call and advise >> Jul 08, 2019  8:16 AM Ivar Drape wrote: Sheliah Mends called again to talk to [facility]. [phone]

## 2019-07-08 NOTE — Telephone Encounter (Signed)
Noted.  Duplicate.

## 2019-07-08 NOTE — Telephone Encounter (Signed)
Rebecca Eaton, CMA 8 minutes ago (8:25 AM)     Copied from [place] [id number]. Topic: General - Other >> Jul 07, 2019 12:29 PM Alanda Slim E wrote: Reason for CRM: Preslea Rhodus called to speak with [facility] about his FMLA paperwork/ please give him a call and advise >> Jul 08, 2019  8:16 AM Ivar Drape wrote: Sheliah Mends called again to talk to [facility]. [phone]

## 2019-07-09 ENCOUNTER — Telehealth: Payer: Self-pay | Admitting: Adult Health

## 2019-07-09 DIAGNOSIS — E039 Hypothyroidism, unspecified: Secondary | ICD-10-CM | POA: Diagnosis not present

## 2019-07-09 DIAGNOSIS — I10 Essential (primary) hypertension: Secondary | ICD-10-CM | POA: Diagnosis not present

## 2019-07-09 DIAGNOSIS — N183 Chronic kidney disease, stage 3 unspecified: Secondary | ICD-10-CM | POA: Diagnosis not present

## 2019-07-09 DIAGNOSIS — S42301A Unspecified fracture of shaft of humerus, right arm, initial encounter for closed fracture: Secondary | ICD-10-CM | POA: Diagnosis not present

## 2019-07-09 DIAGNOSIS — Z9049 Acquired absence of other specified parts of digestive tract: Secondary | ICD-10-CM | POA: Diagnosis not present

## 2019-07-09 NOTE — Telephone Encounter (Signed)
Mary Guzman came an picked up the FMLA forms for [facility].  No Charge

## 2019-07-09 NOTE — Telephone Encounter (Signed)
Pts son came in with FMLA form to complete questions #1-15.  Pts son would like to be called to pick the forms up after completion.  Paperwork was given to [facility] and explained what the pts son was looking on the form to be completed.

## 2019-07-09 NOTE — Telephone Encounter (Signed)
Forms completed and will be picked up by Mr. Velasquez on Wednesday (07/14/2019).  Nothing further needed.

## 2019-07-09 NOTE — Telephone Encounter (Signed)
Form is available for pick up. Tried to reach [place] by phone but left a message for a return call.

## 2019-07-12 ENCOUNTER — Inpatient Hospital Stay: Payer: PPO | Attending: Hematology and Oncology

## 2019-07-12 ENCOUNTER — Other Ambulatory Visit: Payer: Self-pay

## 2019-07-12 ENCOUNTER — Inpatient Hospital Stay: Payer: PPO

## 2019-07-12 DIAGNOSIS — N184 Chronic kidney disease, stage 4 (severe): Secondary | ICD-10-CM

## 2019-07-12 DIAGNOSIS — D631 Anemia in chronic kidney disease: Secondary | ICD-10-CM | POA: Insufficient documentation

## 2019-07-12 LAB — CBC WITH DIFFERENTIAL/PLATELET
Abs Immature Granulocytes: 0.05 10*3/uL (ref 0.00–0.07)
Basophils Absolute: 0.1 10*3/uL (ref 0.0–0.1)
Basophils Relative: 1 %
Eosinophils Absolute: 0.4 10*3/uL (ref 0.0–0.5)
Eosinophils Relative: 5 %
HCT: 35.8 % — ABNORMAL LOW (ref 36.0–46.0)
Hemoglobin: 11.1 g/dL — ABNORMAL LOW (ref 12.0–15.0)
Immature Granulocytes: 1 %
Lymphocytes Relative: 26 %
Lymphs Abs: 2.1 10*3/uL (ref 0.7–4.0)
MCH: 29.3 pg (ref 26.0–34.0)
MCHC: 31 g/dL (ref 30.0–36.0)
MCV: 94.5 fL (ref 80.0–100.0)
Monocytes Absolute: 0.7 10*3/uL (ref 0.1–1.0)
Monocytes Relative: 9 %
Neutro Abs: 4.8 10*3/uL (ref 1.7–7.7)
Neutrophils Relative %: 58 %
Platelets: 375 10*3/uL (ref 150–400)
RBC: 3.79 MIL/uL — ABNORMAL LOW (ref 3.87–5.11)
RDW: 17.2 % — ABNORMAL HIGH (ref 11.5–15.5)
WBC: 8.2 10*3/uL (ref 4.0–10.5)
nRBC: 0 % (ref 0.0–0.2)

## 2019-07-12 NOTE — Telephone Encounter (Signed)
Dwight called and stated [employer] called and they sent back the FMLA paper work due to question #9 / please advise and call Orpah Greek if received

## 2019-07-12 NOTE — Telephone Encounter (Signed)
Message routed to PCP CMA  

## 2019-07-12 NOTE — Progress Notes (Signed)
No injection today HGB 11.1

## 2019-07-13 NOTE — Telephone Encounter (Signed)
#   9 was answered but not in the manner that [employer] wanted.  Resent.  Nothing further needed.

## 2019-07-13 NOTE — Telephone Encounter (Signed)
Left a message for a return call from [place].

## 2019-07-15 DIAGNOSIS — S42301A Unspecified fracture of shaft of humerus, right arm, initial encounter for closed fracture: Secondary | ICD-10-CM | POA: Diagnosis not present

## 2019-07-15 DIAGNOSIS — I5022 Chronic systolic (congestive) heart failure: Secondary | ICD-10-CM | POA: Diagnosis not present

## 2019-07-15 DIAGNOSIS — S42291D Other displaced fracture of upper end of right humerus, subsequent encounter for fracture with routine healing: Secondary | ICD-10-CM | POA: Diagnosis not present

## 2019-07-15 DIAGNOSIS — E038 Other specified hypothyroidism: Secondary | ICD-10-CM | POA: Diagnosis not present

## 2019-07-15 DIAGNOSIS — K3533 Acute appendicitis with perforation and localized peritonitis, with abscess: Secondary | ICD-10-CM | POA: Diagnosis not present

## 2019-07-16 DIAGNOSIS — I509 Heart failure, unspecified: Secondary | ICD-10-CM | POA: Diagnosis not present

## 2019-07-16 DIAGNOSIS — D649 Anemia, unspecified: Secondary | ICD-10-CM | POA: Diagnosis not present

## 2019-07-18 DIAGNOSIS — M6281 Muscle weakness (generalized): Secondary | ICD-10-CM | POA: Diagnosis not present

## 2019-07-18 DIAGNOSIS — M1990 Unspecified osteoarthritis, unspecified site: Secondary | ICD-10-CM | POA: Diagnosis not present

## 2019-07-18 DIAGNOSIS — Z48815 Encounter for surgical aftercare following surgery on the digestive system: Secondary | ICD-10-CM | POA: Diagnosis not present

## 2019-07-18 DIAGNOSIS — R278 Other lack of coordination: Secondary | ICD-10-CM | POA: Diagnosis not present

## 2019-07-18 DIAGNOSIS — R2689 Other abnormalities of gait and mobility: Secondary | ICD-10-CM | POA: Diagnosis not present

## 2019-07-18 DIAGNOSIS — K219 Gastro-esophageal reflux disease without esophagitis: Secondary | ICD-10-CM | POA: Diagnosis not present

## 2019-07-18 DIAGNOSIS — R41841 Cognitive communication deficit: Secondary | ICD-10-CM | POA: Diagnosis not present

## 2019-07-18 DIAGNOSIS — S42301D Unspecified fracture of shaft of humerus, right arm, subsequent encounter for fracture with routine healing: Secondary | ICD-10-CM | POA: Diagnosis not present

## 2019-07-18 DIAGNOSIS — I110 Hypertensive heart disease with heart failure: Secondary | ICD-10-CM | POA: Diagnosis not present

## 2019-07-18 DIAGNOSIS — I5022 Chronic systolic (congestive) heart failure: Secondary | ICD-10-CM | POA: Diagnosis not present

## 2019-07-18 DIAGNOSIS — N184 Chronic kidney disease, stage 4 (severe): Secondary | ICD-10-CM | POA: Diagnosis not present

## 2019-07-18 DIAGNOSIS — E039 Hypothyroidism, unspecified: Secondary | ICD-10-CM | POA: Diagnosis not present

## 2019-07-18 DIAGNOSIS — D539 Nutritional anemia, unspecified: Secondary | ICD-10-CM | POA: Diagnosis not present

## 2019-07-20 DIAGNOSIS — Z20828 Contact with and (suspected) exposure to other viral communicable diseases: Secondary | ICD-10-CM | POA: Diagnosis not present

## 2019-07-22 DIAGNOSIS — S42291D Other displaced fracture of upper end of right humerus, subsequent encounter for fracture with routine healing: Secondary | ICD-10-CM | POA: Diagnosis not present

## 2019-07-22 DIAGNOSIS — I5022 Chronic systolic (congestive) heart failure: Secondary | ICD-10-CM | POA: Diagnosis not present

## 2019-07-22 DIAGNOSIS — K3533 Acute appendicitis with perforation and localized peritonitis, with abscess: Secondary | ICD-10-CM | POA: Diagnosis not present

## 2019-07-22 DIAGNOSIS — R55 Syncope and collapse: Secondary | ICD-10-CM | POA: Diagnosis not present

## 2019-07-23 DIAGNOSIS — I509 Heart failure, unspecified: Secondary | ICD-10-CM | POA: Diagnosis not present

## 2019-07-23 DIAGNOSIS — Z9049 Acquired absence of other specified parts of digestive tract: Secondary | ICD-10-CM | POA: Diagnosis not present

## 2019-07-23 DIAGNOSIS — E039 Hypothyroidism, unspecified: Secondary | ICD-10-CM | POA: Diagnosis not present

## 2019-07-23 DIAGNOSIS — I10 Essential (primary) hypertension: Secondary | ICD-10-CM | POA: Diagnosis not present

## 2019-07-23 DIAGNOSIS — S42301A Unspecified fracture of shaft of humerus, right arm, initial encounter for closed fracture: Secondary | ICD-10-CM | POA: Diagnosis not present

## 2019-07-23 DIAGNOSIS — N183 Chronic kidney disease, stage 3 unspecified: Secondary | ICD-10-CM | POA: Diagnosis not present

## 2019-07-24 DIAGNOSIS — D649 Anemia, unspecified: Secondary | ICD-10-CM | POA: Diagnosis not present

## 2019-07-24 DIAGNOSIS — E039 Hypothyroidism, unspecified: Secondary | ICD-10-CM | POA: Diagnosis not present

## 2019-07-25 DIAGNOSIS — R601 Generalized edema: Secondary | ICD-10-CM | POA: Diagnosis not present

## 2019-07-26 DIAGNOSIS — E039 Hypothyroidism, unspecified: Secondary | ICD-10-CM | POA: Diagnosis not present

## 2019-07-26 DIAGNOSIS — D649 Anemia, unspecified: Secondary | ICD-10-CM | POA: Diagnosis not present

## 2019-07-27 ENCOUNTER — Ambulatory Visit (HOSPITAL_BASED_OUTPATIENT_CLINIC_OR_DEPARTMENT_OTHER)
Admission: RE | Admit: 2019-07-27 | Discharge: 2019-07-27 | Disposition: A | Discharge status: Home / Self Care | Payer: PPO | Source: Ambulatory Visit | Attending: Adult Health | Admitting: Adult Health

## 2019-07-27 ENCOUNTER — Other Ambulatory Visit: Payer: Self-pay

## 2019-07-27 ENCOUNTER — Encounter (HOSPITAL_COMMUNITY): Payer: Self-pay

## 2019-07-27 VITALS — BP 86/54 | HR 76 | Wt 126.0 lb

## 2019-07-27 DIAGNOSIS — N170 Acute kidney failure with tubular necrosis: Secondary | ICD-10-CM | POA: Diagnosis not present

## 2019-07-27 DIAGNOSIS — Z7982 Long term (current) use of aspirin: Secondary | ICD-10-CM | POA: Insufficient documentation

## 2019-07-27 DIAGNOSIS — I5032 Chronic diastolic (congestive) heart failure: Secondary | ICD-10-CM | POA: Diagnosis not present

## 2019-07-27 DIAGNOSIS — R531 Weakness: Secondary | ICD-10-CM | POA: Insufficient documentation

## 2019-07-27 DIAGNOSIS — E039 Hypothyroidism, unspecified: Secondary | ICD-10-CM | POA: Insufficient documentation

## 2019-07-27 DIAGNOSIS — D649 Anemia, unspecified: Secondary | ICD-10-CM | POA: Insufficient documentation

## 2019-07-27 DIAGNOSIS — N184 Chronic kidney disease, stage 4 (severe): Secondary | ICD-10-CM | POA: Insufficient documentation

## 2019-07-27 DIAGNOSIS — I5033 Acute on chronic diastolic (congestive) heart failure: Secondary | ICD-10-CM | POA: Insufficient documentation

## 2019-07-27 DIAGNOSIS — Z79899 Other long term (current) drug therapy: Secondary | ICD-10-CM | POA: Insufficient documentation

## 2019-07-27 DIAGNOSIS — I130 Hypertensive heart and chronic kidney disease with heart failure and stage 1 through stage 4 chronic kidney disease, or unspecified chronic kidney disease: Secondary | ICD-10-CM | POA: Insufficient documentation

## 2019-07-27 DIAGNOSIS — Z20828 Contact with and (suspected) exposure to other viral communicable diseases: Secondary | ICD-10-CM | POA: Diagnosis not present

## 2019-07-27 DIAGNOSIS — I959 Hypotension, unspecified: Secondary | ICD-10-CM | POA: Insufficient documentation

## 2019-07-27 DIAGNOSIS — K219 Gastro-esophageal reflux disease without esophagitis: Secondary | ICD-10-CM | POA: Insufficient documentation

## 2019-07-27 DIAGNOSIS — E875 Hyperkalemia: Secondary | ICD-10-CM | POA: Insufficient documentation

## 2019-07-27 DIAGNOSIS — R0602 Shortness of breath: Secondary | ICD-10-CM | POA: Insufficient documentation

## 2019-07-27 LAB — COMPREHENSIVE METABOLIC PANEL
ALT: 6 U/L (ref 0–44)
AST: 11 U/L — ABNORMAL LOW (ref 15–41)
Albumin: 2.6 g/dL — ABNORMAL LOW (ref 3.5–5.0)
Alkaline Phosphatase: 235 U/L — ABNORMAL HIGH (ref 38–126)
Anion gap: 15 (ref 5–15)
BUN: 72 mg/dL — ABNORMAL HIGH (ref 8–23)
CO2: 11 mmol/L — ABNORMAL LOW (ref 22–32)
Calcium: 8.3 mg/dL — ABNORMAL LOW (ref 8.9–10.3)
Chloride: 111 mmol/L (ref 98–111)
Creatinine, Ser: 5.34 mg/dL — ABNORMAL HIGH (ref 0.44–1.00)
GFR calc Af Amer: 7 mL/min — ABNORMAL LOW (ref >60)
GFR calc non Af Amer: 6 mL/min — ABNORMAL LOW (ref >60)
Glucose, Bld: 99 mg/dL (ref 70–99)
Potassium: 3.5 mmol/L (ref 3.5–5.1)
Sodium: 137 mmol/L (ref 135–145)
Total Bilirubin: 0.6 mg/dL (ref 0.3–1.2)
Total Protein: 6.5 g/dL (ref 6.5–8.1)

## 2019-07-27 LAB — CBC
HCT: 36.7 % (ref 36.0–46.0)
Hemoglobin: 10.9 g/dL — ABNORMAL LOW (ref 12.0–15.0)
MCH: 28.6 pg (ref 26.0–34.0)
MCHC: 29.7 g/dL — ABNORMAL LOW (ref 30.0–36.0)
MCV: 96.3 fL (ref 80.0–100.0)
Platelets: 422 10*3/uL — ABNORMAL HIGH (ref 150–400)
RBC: 3.81 MIL/uL — ABNORMAL LOW (ref 3.87–5.11)
RDW: 16.8 % — ABNORMAL HIGH (ref 11.5–15.5)
WBC: 10.6 10*3/uL — ABNORMAL HIGH (ref 4.0–10.5)
nRBC: 0 % (ref 0.0–0.2)

## 2019-07-27 LAB — BRAIN NATRIURETIC PEPTIDE: B Natriuretic Peptide: 758.8 pg/mL — ABNORMAL HIGH (ref 0.0–100.0)

## 2019-07-27 MED ORDER — PANTOPRAZOLE SODIUM 20 MG PO TBEC: 20.0000 mg | DELAYED_RELEASE_TABLET | Freq: Every day | ORAL | 3 refills | Status: AC

## 2019-07-27 NOTE — Progress Notes (Signed)
ReDS Vest / Clip - 07/27/19 1400      ReDS Vest / Clip   Station Marker  A    Ruler Value  29    ReDS Value  Low volume   17

## 2019-07-27 NOTE — Patient Instructions (Signed)
HOLD Amlodipine  HOLD Lasix for 2 days and resume normal dose on 07/30/2019 START Protonix 20 mg one tab daily   Labs today We will only contact you if something comes back abnormal or we need to make some changes. Otherwise no news is good news!  Be sure to check weight daily   Your physician recommends that you schedule a follow-up appointment in: 2-3 weeks  in the Advanced Practitioners (PA/NP) Clinic    Do the following things EVERYDAY: 1) Weigh yourself in the morning before breakfast. Write it down and keep it in a log. 2) Take your medicines as prescribed 3) Eat low salt foods-Limit salt (sodium) to 2000 mg per day.  4) Stay as active as you can everyday 5) Limit all fluids for the day to less than 2 liters  At the [facility], you and your health needs are our priority. As part of our continuing mission to provide you with exceptional heart care, we have created designated Provider Care Teams. These Care Teams include your primary Cardiologist (physician) and Advanced Practice Providers (APPs- Physician Assistants and Nurse Practitioners) who all work together to provide you with the care you need, when you need it.   You may see any of the following providers on your designated Care Team at your next follow up: Marland Kitchen Dr Glori Bickers . Dr Loralie Champagne . Darrick Grinder, NP . Lyda Jester, PA   Please be sure to bring in all your medications bottles to every appointment.

## 2019-07-27 NOTE — Progress Notes (Addendum)
Date:  07/27/2019   ID:  Mary Guzman, DOB 1921/01/09, MRN [id number]   Provider location: [place] [facility] Type of Visit: Established patient  PCP:  [name], NP  Cardiologist: Dr. Aundra Dubin  Reason for Visit: 3 wk f/u for a/c dCHF    History of Present Illness: Mary Guzman is a 85 y.o. female who has a history of breast cancer in the 1990s, HTN, CKD stage 3-4 and CHF. She lives alone but daughter helps.  She dates her symptoms to around the time when she developed severe hip pain in 8/16. She was under a lot of stress with the severe pain and ended up getting steroid injections.  She developed exertional dyspnea around that time.  However, for > 1 year she has had significant lower extremity edema.  She got to the point where she was short of breath after walking about 15-20 feet.  No chest pain.  CXR in 9/16 showed mild pulmonary edema.  This improved with increase in Lasix to 40 qam and every other day 20 mg pm Lasix.  Repeat echo was done in 11/17.  This showed improvement in EF to 55%.  Echo in 11/18 showed that EF remains 55%.   Seen in ER 05/2018 with hyperkalemia and anemia.   In 9/20, she was admitted after tripping and falling with a right humeral fracture. Later in 9/20, she developed acute appendicitis and had an appendectomy.  She tolerated this well.   She is now at [facility].  After her appendectomy, she gained significant weight.  Appraently was not getting Lasix while in the hospital and gained about 20 lbs.  She was later placed back on Lasix 40 mg daily w/ subsequent wt loss. She had recent f/u w/ Dr. Aundra Dubin on 10/20. She still had significant lower extremity edema but had been workind w/ PT without dyspnea.  No orthopnea/PND.  No chest pain. REDS clip 28%. Given marked peripheral edema, Dr. Aundra Dubin increased her Lasix to 40 mg bid for 4 days then back to 40 mg daily.  Pt instructed to return in 3 weeks for f/u.  Today, she very weak. In  wheelchair. Unable to stand on scale for wt, but she reports her weight at nursing facility yesterday was 126 lb (down from 134 lb last clinic visit). BP is low at 86/54. Pulse rate is 76 bpm. ReDs clip 29%. She endorse 1 week h/o diarrhea. Denies any recent antibiotic history. No sick contacts. Denies abdominal pain, fever and chills. Last bout of diarrhea was 2 days ago. Appetite has been poor. Denies syncope/ near syncope. Daughter reports that Dr. Deforest Hoyles makes rounds at [facility].   Labs (9/16): pro-BNP > 5000, TSH normal Labs (10/16): K 4.5, creatinine 1.68 Labs (11/16): K 4.7, creatinine 1.67 Labs (12/16): K 4.9, creatinine 1.86 Labs (5/17): K 4.9, creatinine 2.1, BNP 373, TSH 0.98 (normal) Labs 01/25/2016: K 4.5 Creatinine 2.37 Labs (6/17): K 4.2, creatinine 2.16 Labs (11/17): K 4.6, creatinine 2.39, HCT 33.7 Labs (5/18): BNP 282, K 4.8, creatinine 3 Labs (11/18): K 4.4, creatinine 3.11 Labs (12/18): TSH normal Labs (10/19): K 4.4, creatinine 2.65 Labs (11/19): hgb 11.1, K 4.7, creatinine 2.42 Labs (1/20): hgb 12.3 Labs (2/20): K 4.7, creatinine 2.24 Labs (5/20): hgb 10.9 Labs (9/20): K 4.4, creatinine 3  PMH: 1. Breast cancer: On left, s/p mastectomy in 1994 along with radiation treatment.  No chemotherapy.  2. Hypothyroidism. 3. OA 4. HTN 5. GERD 6. CKD: Stage 4.  7. Chronic systolic CHF: Echo (36/62) with EF 20-25%, diffuse hypokinesis, mild MR, moderately decreased RV systolic function, PASP 60 mmHg.  - Echo (11/17): EF 55%, septal-lateral dyssynchrony, normal RV size and systolic function.  - Echo (11/18): EF 55%, moderate LVH, septal-lateral dyssynchrony, normal RV size and systolic function.  8. Chronic LBBB 9. Appendectomy 9/20  Past Surgical History:  Procedure Laterality Date   ANKLE SURGERY     Right   INTRACAPSULAR CATARACT EXTRACTION     LAPAROSCOPIC APPENDECTOMY N/A 06/02/2019   Procedure: APPENDECTOMY LAPAROSCOPIC;  Surgeon: Alphonsa Overall, MD;   Location: [place];  Service: General;  Laterality: N/A;   MASTECTOMY  1994     Current Outpatient Medications  Medication Sig Dispense Refill   acetaminophen (TYLENOL) 500 MG tablet Take 500 mg by mouth every 6 (six) hours as needed for mild pain.      aspirin EC 81 MG tablet Take 1 tablet (81 mg total) by mouth daily. 30 tablet 6   carboxymethylcellulose (REFRESH PLUS) 0.5 % SOLN Place 1 drop into both eyes daily as needed (dry eye).      Cholecalciferol (VITAMIN D3) 5000 UNITS CAPS Take 1 capsule by mouth daily.     diphenoxylate-atropine (LOMOTIL) 2.5-0.025 MG/5ML liquid Take 10 mLs by mouth 4 (four) times daily as needed for diarrhea or loose stools. 60 mL 0   furosemide (LASIX) 40 MG tablet Take 40 mg by mouth daily.     levothyroxine (SYNTHROID) 100 MCG tablet Take 100 mcg by mouth daily before breakfast.     loperamide (IMODIUM A-D) 2 MG tablet Take 2 mg by mouth 4 (four) times daily as needed for diarrhea or loose stools.     metoprolol succinate (TOPROL-XL) 25 MG 24 hr tablet TAKE 1 TABLET(25 MG) BY MOUTH TWICE DAILY 180 tablet 1   Multiple Vitamins-Minerals (PRESERVISION AREDS 2 PO) Take 2 tablets by mouth daily.      traMADol-acetaminophen (ULTRACET) 37.5-325 MG tablet Take 1 tablet by mouth every 6 (six) hours as needed. 10 tablet 0   famotidine-calcium carbonate-magnesium hydroxide (PEPCID COMPLETE) 10-800-165 MG chewable tablet Chew 1 tablet by mouth daily as needed.     pantoprazole (PROTONIX) 20 MG tablet Take 1 tablet (20 mg total) by mouth daily. 90 tablet 3   No current facility-administered medications for this encounter.     Allergies:   Patient has no known allergies.   Social History:  The patient  reports that she has never smoked. Her smokeless tobacco use includes chew. She reports that she does not drink alcohol.   Family History:  The patient's family history includes Alcohol abuse in her brother; Asthma in her son.   ROS:  Please see the history  of present illness.   All other systems are personally reviewed and negative.   Exam:  BP (!) 86/54    Pulse 76    Wt 57.2 kg (126 lb) Comment: per patient report   BMI 21.62 kg/m  PHYSICAL EXAM: General:  Well thin appearing elderly WF. No respiratory difficulty HEENT: normal Neck: supple. no JVD. Carotids 2+ bilat; no bruits. No lymphadenopathy or thyromegaly appreciated. Cor: PMI nondisplaced. Regular rate & rhythm. No rubs, gallops or murmurs. Lungs: clear Abdomen: soft, nontender, nondistended. No hepatosplenomegaly. No bruits or masses. Good bowel sounds. Extremities: no cyanosis, clubbing, rash, 1+ bilateral ankle edema Neuro: alert & oriented x 3, cranial nerves grossly intact. moves all 4 extremities w/o difficulty. Affect pleasant.    Recent Labs: 07/27/2019: ALT 6;  B Natriuretic Peptide 758.8; BUN 72; Creatinine, Ser 5.34; Hemoglobin 10.9; Platelets 422; Potassium 3.5; Sodium 137  Personally reviewed   Wt Readings from Last 3 Encounters:  07/27/19 57.2 kg (126 lb)  07/06/19 60.8 kg (134 lb)  06/08/19 70.4 kg (155 lb 3.3 oz)      ASSESSMENT AND PLAN:  1. Chronic systolic CHF: EF 62-56% with moderate RV dysfunction on echo in 10/16.  Etiology of the cardiomyopathy is uncertain: No chest pain or suggestion of CAD, however cannot rule out ischemic cardiomyopathy.  Symptoms noted at a time of significant stress with severe hip pain in 8/16, so cannot rule out Takotsubo CMP. Viral myocarditis remains possible.  Echo in 11/17 showed recovery of EF to 55%.  11/18 echo showed that EF remained 55%. Therefore, it is possible that the original insult was a stress (Takotsubo-type) cardiomyopathy.  Currently, she is not very active, probably NYHA class II-III symptoms.  Last OV 10/20, she had significant peripheral edema but no definite JVD, and ReDs clip only measures 28%. With marked peripheral edema, her Lasix was increased to 40 mg bid for 4 days then back to 40 mg daily.  She now feel  very weak. Wt down from 134>>126 lb.  ReDS clip 29% today. Hypotensive w/ BP of 86/54 in the setting of diarrhea x several days this week, appears to be resolving. Denies syncope/near syncope. -Concerned for dehydration in the setting of diarrhea, recent diuretic increase + poor PO intake.  -Will check CMP and will plan to hold Lasix x 2 days. Can resume on 11/13 -Given hypotension will also hold amlodipine.   ? If her LEE is side effect of amlodipine (on 10 mg daily). Will check BNP today to see if CHF contributing to LEE.  - Wear compression stockings during the day.  - Continue Toprol XL 25 mg bid.  - Stay off ACEI/ARB/ARNI/spironolactone with CKD IV.  2. CKD: Stage IV. She follows with nephrology.  Creatinine recently around 3.  Check CMP today given recent diuretic dose adjustment, hypotension and weakness.    3. HTN: hypotensive today 86/54. Symptomatic w / weakness. Denies syncope/ near syncope. Suspect due to diarrhea, diuretics and poor PO intake.  - check CMP and CBC  -hold lasix x 2 days -hold amlodipine indefinitely. Pt to be seen by SNF MD and we will arrange clinic f/u in 2 weeks -if hypotension persist at SNF, may need IVF bolus   Followup in 2 wks.    Signed, Lyda Jester, PA-C  07/27/2019   [place] [address] [facility] [place] [address] [phone] (office) [phone] (fax)

## 2019-07-28 DIAGNOSIS — I509 Heart failure, unspecified: Secondary | ICD-10-CM | POA: Diagnosis not present

## 2019-07-28 DIAGNOSIS — D649 Anemia, unspecified: Secondary | ICD-10-CM | POA: Diagnosis not present

## 2019-07-28 DIAGNOSIS — R0602 Shortness of breath: Secondary | ICD-10-CM | POA: Diagnosis not present

## 2019-07-28 DIAGNOSIS — I10 Essential (primary) hypertension: Secondary | ICD-10-CM | POA: Diagnosis not present

## 2019-07-29 DIAGNOSIS — R197 Diarrhea, unspecified: Secondary | ICD-10-CM | POA: Diagnosis not present

## 2019-07-29 DIAGNOSIS — N183 Chronic kidney disease, stage 3 unspecified: Secondary | ICD-10-CM | POA: Diagnosis not present

## 2019-07-29 DIAGNOSIS — N178 Other acute kidney failure: Secondary | ICD-10-CM | POA: Diagnosis not present

## 2019-07-29 DIAGNOSIS — I5022 Chronic systolic (congestive) heart failure: Secondary | ICD-10-CM | POA: Diagnosis not present

## 2019-07-30 ENCOUNTER — Emergency Department (HOSPITAL_COMMUNITY): Payer: PPO

## 2019-07-30 ENCOUNTER — Encounter (HOSPITAL_COMMUNITY): Payer: Self-pay | Admitting: Emergency Medicine

## 2019-07-30 ENCOUNTER — Other Ambulatory Visit: Payer: Self-pay

## 2019-07-30 ENCOUNTER — Inpatient Hospital Stay (HOSPITAL_COMMUNITY)
Admission: EM | Admit: 2019-07-30 | Discharge: 2019-08-04 | DRG: 683 | Disposition: A | Discharge status: Hospice | Payer: PPO | Source: Skilled Nursing Facility | Attending: Internal Medicine | Admitting: Internal Medicine

## 2019-07-30 DIAGNOSIS — Z66 Do not resuscitate: Secondary | ICD-10-CM | POA: Diagnosis present

## 2019-07-30 DIAGNOSIS — K219 Gastro-esophageal reflux disease without esophagitis: Secondary | ICD-10-CM | POA: Diagnosis present

## 2019-07-30 DIAGNOSIS — E872 Acidosis, unspecified: Secondary | ICD-10-CM | POA: Diagnosis present

## 2019-07-30 DIAGNOSIS — N189 Chronic kidney disease, unspecified: Secondary | ICD-10-CM | POA: Diagnosis present

## 2019-07-30 DIAGNOSIS — L89151 Pressure ulcer of sacral region, stage 1: Secondary | ICD-10-CM | POA: Diagnosis present

## 2019-07-30 DIAGNOSIS — I5022 Chronic systolic (congestive) heart failure: Secondary | ICD-10-CM | POA: Diagnosis present

## 2019-07-30 DIAGNOSIS — I10 Essential (primary) hypertension: Secondary | ICD-10-CM | POA: Diagnosis present

## 2019-07-30 DIAGNOSIS — I132 Hypertensive heart and chronic kidney disease with heart failure and with stage 5 chronic kidney disease, or end stage renal disease: Secondary | ICD-10-CM | POA: Diagnosis present

## 2019-07-30 DIAGNOSIS — N179 Acute kidney failure, unspecified: Secondary | ICD-10-CM | POA: Diagnosis present

## 2019-07-30 DIAGNOSIS — E860 Dehydration: Secondary | ICD-10-CM | POA: Diagnosis present

## 2019-07-30 DIAGNOSIS — N19 Unspecified kidney failure: Secondary | ICD-10-CM | POA: Diagnosis not present

## 2019-07-30 DIAGNOSIS — L8990 Pressure ulcer of unspecified site, unspecified stage: Secondary | ICD-10-CM | POA: Insufficient documentation

## 2019-07-30 DIAGNOSIS — D649 Anemia, unspecified: Secondary | ICD-10-CM | POA: Diagnosis not present

## 2019-07-30 DIAGNOSIS — Z79890 Hormone replacement therapy (postmenopausal): Secondary | ICD-10-CM

## 2019-07-30 DIAGNOSIS — Z853 Personal history of malignant neoplasm of breast: Secondary | ICD-10-CM

## 2019-07-30 DIAGNOSIS — I509 Heart failure, unspecified: Secondary | ICD-10-CM | POA: Diagnosis not present

## 2019-07-30 DIAGNOSIS — F17220 Nicotine dependence, chewing tobacco, uncomplicated: Secondary | ICD-10-CM | POA: Diagnosis not present

## 2019-07-30 DIAGNOSIS — R001 Bradycardia, unspecified: Secondary | ICD-10-CM | POA: Diagnosis not present

## 2019-07-30 DIAGNOSIS — Z923 Personal history of irradiation: Secondary | ICD-10-CM

## 2019-07-30 DIAGNOSIS — E876 Hypokalemia: Secondary | ICD-10-CM | POA: Diagnosis present

## 2019-07-30 DIAGNOSIS — R197 Diarrhea, unspecified: Secondary | ICD-10-CM | POA: Diagnosis present

## 2019-07-30 DIAGNOSIS — I959 Hypotension, unspecified: Secondary | ICD-10-CM | POA: Diagnosis not present

## 2019-07-30 DIAGNOSIS — R279 Unspecified lack of coordination: Secondary | ICD-10-CM | POA: Diagnosis not present

## 2019-07-30 DIAGNOSIS — I4510 Unspecified right bundle-branch block: Secondary | ICD-10-CM | POA: Diagnosis not present

## 2019-07-30 DIAGNOSIS — R0602 Shortness of breath: Secondary | ICD-10-CM | POA: Diagnosis not present

## 2019-07-30 DIAGNOSIS — E039 Hypothyroidism, unspecified: Secondary | ICD-10-CM | POA: Diagnosis not present

## 2019-07-30 DIAGNOSIS — D500 Iron deficiency anemia secondary to blood loss (chronic): Secondary | ICD-10-CM | POA: Diagnosis present

## 2019-07-30 DIAGNOSIS — Z79899 Other long term (current) drug therapy: Secondary | ICD-10-CM | POA: Diagnosis not present

## 2019-07-30 DIAGNOSIS — R0902 Hypoxemia: Secondary | ICD-10-CM | POA: Diagnosis not present

## 2019-07-30 DIAGNOSIS — I129 Hypertensive chronic kidney disease with stage 1 through stage 4 chronic kidney disease, or unspecified chronic kidney disease: Secondary | ICD-10-CM | POA: Diagnosis not present

## 2019-07-30 DIAGNOSIS — N185 Chronic kidney disease, stage 5: Secondary | ICD-10-CM | POA: Diagnosis present

## 2019-07-30 DIAGNOSIS — N178 Other acute kidney failure: Secondary | ICD-10-CM | POA: Diagnosis not present

## 2019-07-30 DIAGNOSIS — Z7982 Long term (current) use of aspirin: Secondary | ICD-10-CM | POA: Diagnosis not present

## 2019-07-30 DIAGNOSIS — N170 Acute kidney failure with tubular necrosis: Secondary | ICD-10-CM | POA: Diagnosis present

## 2019-07-30 DIAGNOSIS — N183 Chronic kidney disease, stage 3 unspecified: Secondary | ICD-10-CM | POA: Diagnosis not present

## 2019-07-30 DIAGNOSIS — M810 Age-related osteoporosis without current pathological fracture: Secondary | ICD-10-CM | POA: Diagnosis present

## 2019-07-30 DIAGNOSIS — Z20828 Contact with and (suspected) exposure to other viral communicable diseases: Secondary | ICD-10-CM | POA: Diagnosis present

## 2019-07-30 DIAGNOSIS — R531 Weakness: Secondary | ICD-10-CM | POA: Diagnosis not present

## 2019-07-30 DIAGNOSIS — N184 Chronic kidney disease, stage 4 (severe): Secondary | ICD-10-CM | POA: Diagnosis not present

## 2019-07-30 DIAGNOSIS — Z9012 Acquired absence of left breast and nipple: Secondary | ICD-10-CM | POA: Diagnosis not present

## 2019-07-30 DIAGNOSIS — Z743 Need for continuous supervision: Secondary | ICD-10-CM | POA: Diagnosis not present

## 2019-07-30 LAB — BASIC METABOLIC PANEL
Anion gap: 18 — ABNORMAL HIGH (ref 5–15)
BUN: 95 mg/dL — ABNORMAL HIGH (ref 8–23)
CO2: 10 mmol/L — ABNORMAL LOW (ref 22–32)
Calcium: 8 mg/dL — ABNORMAL LOW (ref 8.9–10.3)
Chloride: 108 mmol/L (ref 98–111)
Creatinine, Ser: 7.09 mg/dL — ABNORMAL HIGH (ref 0.44–1.00)
GFR calc Af Amer: 5 mL/min — ABNORMAL LOW (ref >60)
GFR calc non Af Amer: 4 mL/min — ABNORMAL LOW (ref >60)
Glucose, Bld: 103 mg/dL — ABNORMAL HIGH (ref 70–99)
Potassium: 3.9 mmol/L (ref 3.5–5.1)
Sodium: 136 mmol/L (ref 135–145)

## 2019-07-30 LAB — I-STAT CHEM 8, ED
BUN: 107 mg/dL — ABNORMAL HIGH (ref 8–23)
Calcium, Ion: 1.06 mmol/L — ABNORMAL LOW (ref 1.15–1.40)
Chloride: 111 mmol/L (ref 98–111)
Creatinine, Ser: 7.3 mg/dL — ABNORMAL HIGH (ref 0.44–1.00)
Glucose, Bld: 98 mg/dL (ref 70–99)
HCT: 31 % — ABNORMAL LOW (ref 36.0–46.0)
Hemoglobin: 10.5 g/dL — ABNORMAL LOW (ref 12.0–15.0)
Potassium: 3.9 mmol/L (ref 3.5–5.1)
Sodium: 137 mmol/L (ref 135–145)
TCO2: 12 mmol/L — ABNORMAL LOW (ref 22–32)

## 2019-07-30 LAB — CBC WITH DIFFERENTIAL/PLATELET
Abs Immature Granulocytes: 0.62 10*3/uL — ABNORMAL HIGH (ref 0.00–0.07)
Basophils Absolute: 0.1 10*3/uL (ref 0.0–0.1)
Basophils Relative: 0 %
Eosinophils Absolute: 0.3 10*3/uL (ref 0.0–0.5)
Eosinophils Relative: 2 %
HCT: 32.9 % — ABNORMAL LOW (ref 36.0–46.0)
Hemoglobin: 10.1 g/dL — ABNORMAL LOW (ref 12.0–15.0)
Immature Granulocytes: 4 %
Lymphocytes Relative: 11 %
Lymphs Abs: 1.7 10*3/uL (ref 0.7–4.0)
MCH: 28.2 pg (ref 26.0–34.0)
MCHC: 30.7 g/dL (ref 30.0–36.0)
MCV: 91.9 fL (ref 80.0–100.0)
Monocytes Absolute: 0.6 10*3/uL (ref 0.1–1.0)
Monocytes Relative: 4 %
Neutro Abs: 12.3 10*3/uL — ABNORMAL HIGH (ref 1.7–7.7)
Neutrophils Relative %: 79 %
Platelets: 360 10*3/uL (ref 150–400)
RBC: 3.58 MIL/uL — ABNORMAL LOW (ref 3.87–5.11)
RDW: 16.8 % — ABNORMAL HIGH (ref 11.5–15.5)
WBC: 15.5 10*3/uL — ABNORMAL HIGH (ref 4.0–10.5)
nRBC: 0.1 % (ref 0.0–0.2)

## 2019-07-30 LAB — TROPONIN I (HIGH SENSITIVITY)
Troponin I (High Sensitivity): 12 ng/L (ref <18)
Troponin I (High Sensitivity): 13 ng/L (ref <18)

## 2019-07-30 LAB — BRAIN NATRIURETIC PEPTIDE: B Natriuretic Peptide: 474.5 pg/mL — ABNORMAL HIGH (ref 0.0–100.0)

## 2019-07-30 MED ORDER — SODIUM CHLORIDE 0.9 % IV SOLN: 250.0000 mL | INTRAVENOUS | Status: DC | PRN

## 2019-07-30 MED ORDER — LACTATED RINGERS IV BOLUS
500.0000 mL | Freq: Once | INTRAVENOUS | Status: AC
  Administered 2019-07-30: 500 mL via INTRAVENOUS

## 2019-07-30 MED ORDER — HEPARIN SODIUM (PORCINE) 5000 UNIT/ML IJ SOLN
5000.0000 [IU] | Freq: Two times a day (BID) | INTRAMUSCULAR | Status: DC
  Administered 2019-07-31 – 2019-08-04 (×5): 5000 [IU] via SUBCUTANEOUS
  Filled 2019-07-30 (×5): qty 1

## 2019-07-30 MED ORDER — LACTATED RINGERS IV BOLUS
250.0000 mL | Freq: Once | INTRAVENOUS | Status: AC
  Administered 2019-07-30: 250 mL via INTRAVENOUS

## 2019-07-30 MED ORDER — SODIUM BICARBONATE-DEXTROSE 150-5 MEQ/L-% IV SOLN
150.0000 meq | INTRAVENOUS | Status: AC
  Administered 2019-07-30: 150 meq via INTRAVENOUS
  Filled 2019-07-30: qty 1000

## 2019-07-30 MED ORDER — SODIUM CHLORIDE 0.9 % IV BOLUS: 250.0000 mL | Freq: Once | INTRAVENOUS | Status: DC

## 2019-07-30 MED ORDER — SODIUM CHLORIDE 0.9% FLUSH
3.0000 mL | Freq: Two times a day (BID) | INTRAVENOUS | Status: DC
  Administered 2019-08-03: 3 mL via INTRAVENOUS

## 2019-07-30 MED ORDER — SODIUM CHLORIDE 0.9% FLUSH: 3.0000 mL | INTRAVENOUS | Status: DC | PRN

## 2019-07-30 NOTE — ED Triage Notes (Signed)
Pt here from [facility] via [facility], facility reports labs have worsened sine October 30th, this morning BUN was 95.1, CO2 11, creatinine 6.83.  Pt A&O x4, reports feeling weak, decreased urinary output and burning.  Has MOST form but family requesting full work-up, stated they are unsure if they would want her to do dialysis.  Right arm edema present from old fracture.

## 2019-07-30 NOTE — ED Provider Notes (Signed)
[facility] EMERGENCY DEPARTMENT Provider Note   CSN: [id number] Arrival date & time: 07/30/19  1715     History   Chief Complaint Chief Complaint  Patient presents with  . Abnormal Lab    HPI Mary Guzman is a 85 y.o. female hx of GERD, HTN, here presenting with weakness, renal failure.  Patient has been weak for the last several weeks and she is currently Ritta Slot.  She has been seeing cardiology and her kidney function has been getting worse as well as BNP has been elevating.  Her Lasix doses have been adjusted significantly over the last several weeks. She initially was put on Lasix 40 mg twice daily. However 2 days ago, she saw cardiology and since she has not been eating and drinking much and had diarrhea, she was taken off of Lasix for 2 days and supposed to start Lasix today .  The facility got labs today and it shows that her creatinine went up to 6.8 and her BUN is 92.  She also had a BNP done today that was 12000.  Patient has a MOST form that states that she is DNR and she is not currently on dialysis.  EMS reported that family may want to reverse that decision make her full code. Patient reported being weak and tired and has bilateral leg swelling and right arm swelling (recent humerus fracture).      The history is provided by the patient.  Level V caveat- condition of patient   Past Medical History:  Diagnosis Date  . Cancer ([place])    Breast Hx of stage IIB, moderately differentiated with 3 of 8 possible lymph nodes.  . DJD (degenerative joint disease)   . GERD (gastroesophageal reflux disease)   . Hypertension   . Osteoporosis   . Thyroid disease    Hypothyroidism    Patient Active Problem List   Diagnosis Date Noted  . Appendicitis 05/27/2019  . Right humeral fracture 05/27/2019  . Deficiency anemia 06/02/2018  . Chronic kidney disease (CKD), stage IV (severe) ([place]) 06/02/2018  . Iron deficiency anemia due to chronic blood loss 06/01/2018   . Anemia in chronic kidney disease 07/02/2016  . CKD (chronic kidney disease), stage III ([place]) 07/29/2015  . Chronic systolic CHF (congestive heart failure) ([place]) 07/16/2015  . Acute on chronic systolic heart failure ([place]) 06/30/2015  . Bilateral leg edema 07/27/2014  . Chronic kidney disease 06/07/2013  . Essential hypertension 07/03/2009  . Hypothyroidism 03/16/2007  . GERD 03/16/2007  . Osteoarthritis 03/16/2007  . Osteoporosis 03/16/2007  . BREAST CANCER, HX OF 03/16/2007    Past Surgical History:  Procedure Laterality Date  . ANKLE SURGERY     Right  . INTRACAPSULAR CATARACT EXTRACTION    . LAPAROSCOPIC APPENDECTOMY N/A 06/02/2019   Procedure: APPENDECTOMY LAPAROSCOPIC;  Surgeon: Alphonsa Overall, MD;  Location: [facility];  Service: General;  Laterality: N/A;  . MASTECTOMY  1994     OB History    Gravida  2   Para  2   Term      Preterm      AB  0   Living        SAB      TAB      Ectopic      Multiple      Live Births               Home Medications    Prior to Admission medications   Medication Sig Start  Date End Date Taking? Authorizing Provider  acetaminophen (TYLENOL) 500 MG tablet Take 500 mg by mouth every 6 (six) hours as needed for mild pain.     [provider]  aspirin EC 81 MG tablet Take 1 tablet (81 mg total) by mouth daily. 07/29/16   Larey Dresser, MD  carboxymethylcellulose (REFRESH PLUS) 0.5 % SOLN Place 1 drop into both eyes daily as needed (dry eye).     [provider]  Cholecalciferol (VITAMIN D3) 5000 UNITS CAPS Take 1 capsule by mouth daily.    [provider]  diphenoxylate-atropine (LOMOTIL) 2.5-0.025 MG/5ML liquid Take 10 mLs by mouth 4 (four) times daily as needed for diarrhea or loose stools. 06/08/19   Darliss Cheney, MD  famotidine-calcium carbonate-magnesium hydroxide (PEPCID COMPLETE) 10-800-165 MG chewable tablet Chew 1 tablet by mouth daily as needed.    [provider]  furosemide  (LASIX) 40 MG tablet Take 40 mg by mouth daily.    [provider]  levothyroxine (SYNTHROID) 100 MCG tablet Take 100 mcg by mouth daily before breakfast.    [provider]  loperamide (IMODIUM A-D) 2 MG tablet Take 2 mg by mouth 4 (four) times daily as needed for diarrhea or loose stools.    [provider]  metoprolol succinate (TOPROL-XL) 25 MG 24 hr tablet TAKE 1 TABLET(25 MG) BY MOUTH TWICE DAILY 06/25/19   Larey Dresser, MD  Multiple Vitamins-Minerals (PRESERVISION AREDS 2 PO) Take 2 tablets by mouth daily.     [provider]  pantoprazole (PROTONIX) 20 MG tablet Take 1 tablet (20 mg total) by mouth daily. 07/27/19 07/26/20  Consuelo Pandy, PA-C  traMADol-acetaminophen (ULTRACET) 37.5-325 MG tablet Take 1 tablet by mouth every 6 (six) hours as needed. 06/08/19   Darliss Cheney, MD    Family History Family History  Problem Relation Age of Onset  . Alcohol abuse Brother   . Asthma Son     Social History Social History   Tobacco Use  . Smoking status: Never Smoker  . Smokeless tobacco: Current User    Types: Chew  Substance Use Topics  . Alcohol use: No  . Drug use: Not on file     Allergies   Patient has no known allergies.   Review of Systems Review of Systems  Cardiovascular: Positive for leg swelling.  Neurological: Positive for weakness.  All other systems reviewed and are negative.    Physical Exam Updated Vital Signs BP (!) 112/58   Pulse (!) 56   Temp 98.4 F (36.9 C) (Oral)   Resp 20   SpO2 99%   Physical Exam Vitals signs and nursing note reviewed.  Constitutional:      Comments: Chronically ill   HENT:     Head: Normocephalic.     Nose: Nose normal.     Mouth/Throat:     Mouth: Mucous membranes are dry.  Eyes:     Extraocular Movements: Extraocular movements intact.     Pupils: Pupils are equal, round, and reactive to light.  Neck:     Musculoskeletal: Normal range of motion.  Cardiovascular:      Rate and Rhythm: Normal rate and regular rhythm.  Pulmonary:     Comments: Crackles bilateral bases  Abdominal:     General: Abdomen is flat.     Palpations: Abdomen is soft.  Musculoskeletal:     Comments: 1+ edema bilaterally   Skin:    General: Skin is warm.     Capillary  Refill: Capillary refill takes less than 2 seconds.  Neurological:     General: No focal deficit present.  Psychiatric:        Mood and Affect: Mood normal.      ED Treatments / Results  Labs (all labs ordered are listed, but only abnormal results are displayed) Labs Reviewed  CBC WITH DIFFERENTIAL/PLATELET - Abnormal; Notable for the following components:      Result Value   WBC 15.5 (*)    RBC 3.58 (*)    Hemoglobin 10.1 (*)    HCT 32.9 (*)    RDW 16.8 (*)    Neutro Abs 12.3 (*)    Abs Immature Granulocytes 0.62 (*)    All other components within normal limits  BASIC METABOLIC PANEL - Abnormal; Notable for the following components:   CO2 10 (*)    Glucose, Bld 103 (*)    BUN 95 (*)    Creatinine, Ser 7.09 (*)    Calcium 8.0 (*)    GFR calc non Af Amer 4 (*)    GFR calc Af Amer 5 (*)    Anion gap 18 (*)    All other components within normal limits  BRAIN NATRIURETIC PEPTIDE - Abnormal; Notable for the following components:   B Natriuretic Peptide 474.5 (*)    All other components within normal limits  I-STAT CHEM 8, ED - Abnormal; Notable for the following components:   BUN 107 (*)    Creatinine, Ser 7.30 (*)    Calcium, Ion 1.06 (*)    TCO2 12 (*)    Hemoglobin 10.5 (*)    HCT 31.0 (*)    All other components within normal limits  URINE CULTURE  SARS CORONAVIRUS 2 (TAT 6-24 HRS)  URINALYSIS, ROUTINE W REFLEX MICROSCOPIC  TROPONIN I (HIGH SENSITIVITY)  TROPONIN I (HIGH SENSITIVITY)    EKG EKG Interpretation  Date/Time:  Friday July 30 2019 17:24:25 EST Ventricular Rate:  55 PR Interval:    QRS Duration: 166 QT Interval:  525 QTC Calculation: 503 R Axis:   -61 Text  Interpretation: Sinus rhythm Left bundle branch block No significant change since last tracing Confirmed by Wandra Arthurs [phone]) on 07/30/2019 5:27:07 PM   Radiology Dg Chest Port 1 View  Result Date: 07/30/2019 CLINICAL DATA:  Shortness of breath EXAM: PORTABLE CHEST 1 VIEW COMPARISON:  06/04/2019 FINDINGS: There is a subacute appearing fracture involving the proximal right humerus. This was likely present on the prior study. There is no pneumothorax. There is atelectasis at the left lung base with a small left-sided pleural effusion. There is no definite acute osseous abnormality. There are subacute rib fractures involving the seventh and eighth ribs posteriorly on the right. There is no pneumothorax. IMPRESSION: 1. Low lung volumes. Persistent atelectasis with a small left-sided effusion at the left lung base. Probable trace to small right-sided pleural effusion. 2. Subacute healing right humeral fracture. 3. Subacute right-sided rib fractures involving the seventh and eighth ribs posteriorly. Electronically Signed   By: Constance Holster M.D.   On: 07/30/2019 17:43    Procedures Procedures (including critical care time)  CRITICAL CARE Performed by: Wandra Arthurs   Total critical care time: 30 minutes  Critical care time was exclusive of separately billable procedures and treating other patients.  Critical care was necessary to treat or prevent imminent or life-threatening deterioration.  Critical care was time spent personally by me on the following activities: development of treatment plan with patient and/or surrogate as  well as nursing, discussions with consultants, evaluation of patient's response to treatment, examination of patient, obtaining history from patient or surrogate, ordering and performing treatments and interventions, ordering and review of laboratory studies, ordering and review of radiographic studies, pulse oximetry and re-evaluation of patient's condition.    Medications Ordered in ED Medications  lactated ringers bolus 250 mL (has no administration in time range)     Initial Impression / Assessment and Plan / ED Course  I have reviewed the triage vital signs and the nursing notes.  Pertinent labs & imaging results that were available during my care of the patient were reviewed by me and considered in my medical decision making (see chart for details).       Mary Guzman is a 85 y.o. female here with weakness, leg swelling, renal failure. I reviewed labs from facility and her Cr is 6.8 today and BUN is 92. I think etiology of renal failure is likely multi factorial- volume depletion from poor intake and diarrhea vs intrinsic renal failure vs recent lasix use. Will repeat labs and likely need to consult nephrology. Will discuss with family regarding goals of care.   7:33 PM Cr is 7. BUN 95. Appears dehydrated. I talked to Dr. Tamala Julian from nephrology. He recommend gentle IV hydration and recheck kidney function. If she doesn't improve, recommend hospice. Hospitalist to admit.    Final Clinical Impressions(s) / ED Diagnoses   Final diagnoses:  None    ED Discharge Orders    None       Drenda Freeze, MD 07/30/19 [phone]

## 2019-07-31 ENCOUNTER — Other Ambulatory Visit: Payer: Self-pay

## 2019-07-31 ENCOUNTER — Encounter (HOSPITAL_COMMUNITY): Payer: Self-pay | Admitting: General Practice

## 2019-07-31 DIAGNOSIS — E039 Hypothyroidism, unspecified: Secondary | ICD-10-CM

## 2019-07-31 DIAGNOSIS — L8990 Pressure ulcer of unspecified site, unspecified stage: Secondary | ICD-10-CM | POA: Insufficient documentation

## 2019-07-31 DIAGNOSIS — R197 Diarrhea, unspecified: Secondary | ICD-10-CM

## 2019-07-31 DIAGNOSIS — I10 Essential (primary) hypertension: Secondary | ICD-10-CM

## 2019-07-31 DIAGNOSIS — E872 Acidosis: Secondary | ICD-10-CM

## 2019-07-31 DIAGNOSIS — I5022 Chronic systolic (congestive) heart failure: Secondary | ICD-10-CM

## 2019-07-31 DIAGNOSIS — N185 Chronic kidney disease, stage 5: Secondary | ICD-10-CM

## 2019-07-31 DIAGNOSIS — N179 Acute kidney failure, unspecified: Secondary | ICD-10-CM

## 2019-07-31 DIAGNOSIS — N189 Chronic kidney disease, unspecified: Secondary | ICD-10-CM

## 2019-07-31 LAB — COMPREHENSIVE METABOLIC PANEL
ALT: <5 U/L (ref 0–44)
AST: 9 U/L — ABNORMAL LOW (ref 15–41)
Albumin: 2.2 g/dL — ABNORMAL LOW (ref 3.5–5.0)
Alkaline Phosphatase: 153 U/L — ABNORMAL HIGH (ref 38–126)
Anion gap: 17 — ABNORMAL HIGH (ref 5–15)
BUN: 97 mg/dL — ABNORMAL HIGH (ref 8–23)
CO2: 12 mmol/L — ABNORMAL LOW (ref 22–32)
Calcium: 7.8 mg/dL — ABNORMAL LOW (ref 8.9–10.3)
Chloride: 107 mmol/L (ref 98–111)
Creatinine, Ser: 7.23 mg/dL — ABNORMAL HIGH (ref 0.44–1.00)
GFR calc Af Amer: 5 mL/min — ABNORMAL LOW (ref >60)
GFR calc non Af Amer: 4 mL/min — ABNORMAL LOW (ref >60)
Glucose, Bld: 97 mg/dL (ref 70–99)
Potassium: 3.7 mmol/L (ref 3.5–5.1)
Sodium: 136 mmol/L (ref 135–145)
Total Bilirubin: 0.3 mg/dL (ref 0.3–1.2)
Total Protein: 5.2 g/dL — ABNORMAL LOW (ref 6.5–8.1)

## 2019-07-31 LAB — URINALYSIS, ROUTINE W REFLEX MICROSCOPIC
Bilirubin Urine: NEGATIVE
Glucose, UA: NEGATIVE mg/dL
Hgb urine dipstick: NEGATIVE
Ketones, ur: NEGATIVE mg/dL
Nitrite: NEGATIVE
Protein, ur: 30 mg/dL — AB
Specific Gravity, Urine: 1.013 (ref 1.005–1.030)
pH: 5 (ref 5.0–8.0)

## 2019-07-31 LAB — CBC
HCT: 32.7 % — ABNORMAL LOW (ref 36.0–46.0)
Hemoglobin: 10.3 g/dL — ABNORMAL LOW (ref 12.0–15.0)
MCH: 28 pg (ref 26.0–34.0)
MCHC: 31.5 g/dL (ref 30.0–36.0)
MCV: 88.9 fL (ref 80.0–100.0)
Platelets: 361 10*3/uL (ref 150–400)
RBC: 3.68 MIL/uL — ABNORMAL LOW (ref 3.87–5.11)
RDW: 16.8 % — ABNORMAL HIGH (ref 11.5–15.5)
WBC: 12.5 10*3/uL — ABNORMAL HIGH (ref 4.0–10.5)
nRBC: 0 % (ref 0.0–0.2)

## 2019-07-31 LAB — SARS CORONAVIRUS 2 (TAT 6-24 HRS): SARS Coronavirus 2: NEGATIVE

## 2019-07-31 LAB — MAGNESIUM: Magnesium: 2.1 mg/dL (ref 1.7–2.4)

## 2019-07-31 LAB — PHOSPHORUS: Phosphorus: 5.8 mg/dL — ABNORMAL HIGH (ref 2.5–4.6)

## 2019-07-31 MED ORDER — NEPRO/CARBSTEADY PO LIQD
237.0000 mL | ORAL | Status: DC
  Administered 2019-07-31 – 2019-08-04 (×5): 237 mL via ORAL

## 2019-07-31 MED ORDER — DEXTROSE 5 % IV SOLN: Freq: Once | INTRAVENOUS | Status: DC

## 2019-07-31 MED ORDER — B COMPLEX-C PO TABS
1.0000 | ORAL_TABLET | Freq: Every day | ORAL | Status: DC
  Administered 2019-07-31 – 2019-08-04 (×5): 1 via ORAL
  Filled 2019-07-31 (×5): qty 1

## 2019-07-31 MED ORDER — SODIUM BICARBONATE-DEXTROSE 150-5 MEQ/L-% IV SOLN
150.0000 meq | Freq: Once | INTRAVENOUS | Status: AC
  Administered 2019-07-31: 150 meq via INTRAVENOUS
  Filled 2019-07-31: qty 1000

## 2019-07-31 MED ORDER — SODIUM CHLORIDE 0.9 % IV SOLN
2.0000 g | INTRAVENOUS | Status: DC
  Administered 2019-07-31 – 2019-08-02 (×3): 2 g via INTRAVENOUS
  Filled 2019-07-31 (×3): qty 2

## 2019-07-31 NOTE — Plan of Care (Signed)
  Problem: Clinical Measurements: Goal: Ability to maintain clinical measurements within normal limits will improve Outcome: Progressing   

## 2019-07-31 NOTE — H&P (Addendum)
History and Physical    Mary Guzman EXB:[id number] DOB: 03-12-1921 DOA: 07/30/2019  PCP: [name], NP    Patient coming from: Nursing home    Chief Complaint: Weakness, lethargy, not eating and drinking  HPI: Mary Guzman is a 85 y.o. female with medical history significant of hypertension congestive heart failure GERD hypothyroidism nursing home resident, DNR status, came with a chief complaint of increase of lethargy, not eating and drinking, diarrhea for the last 2 weeks, and worsening of the kidney function.  Patient was evaluated by cardiology 2 days ago and because of the worsening of the kidney function the Lasix was stopped.  Patient supposed to be on Lasix 40 mg twice a day.  Her BUN now is 107 creatinine 7.3.  ED Course:   In the emergency room Patient stable hemodynamically, she looks dry BUN 107 creatinine 7.3, CO2 12 CT scan abdomen and pelvis no obstruction Emergency room physician discussed the case with nephrology Patient 85 years old indication for dialysis Recommended small boluses of Ringer lactate.  Review of Systems: As per HPI otherwise 10 point review of systems negative.  Except for lethargy, Not eating or drinking Diarrhea  Past Medical History:  Diagnosis Date  . 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  . DJD (degenerative joint disease)   . GERD (gastroesophageal reflux disease)   . Hypertension   . Osteoporosis   . Thyroid disease    Hypothyroidism    Past Surgical History:  Procedure Laterality Date  . ANKLE SURGERY     Right  . INTRACAPSULAR CATARACT EXTRACTION    . LAPAROSCOPIC APPENDECTOMY N/A 06/02/2019   Procedure: APPENDECTOMY LAPAROSCOPIC;  Surgeon: Alphonsa Overall, MD;  Location: [facility];  Service: General;  Laterality: N/A;  . MASTECTOMY  1994     reports that she has never smoked. Her smokeless tobacco use includes chew. She reports that she does not drink  alcohol. No history on file for drug.  No Known Allergies  Family History  Problem Relation Age of Onset  . Alcohol abuse Brother   . Asthma Son      Prior to Admission medications   Medication Sig Start Date End Date Taking? Authorizing Provider  acetaminophen (TYLENOL) 500 MG tablet Take 500 mg by mouth every 6 (six) hours as needed for mild pain.    Yes [provider]  aspirin EC 81 MG tablet Take 1 tablet (81 mg total) by mouth daily. 07/29/16  Yes Larey Dresser, MD  Cholecalciferol (VITAMIN D3) 5000 UNITS CAPS Take 1 capsule by mouth daily.   Yes [provider]  cholestyramine (QUESTRAN) 4 g packet Take 1 g by mouth daily.   Yes [provider]  diphenoxylate-atropine (LOMOTIL) 2.5-0.025 MG/5ML liquid Take 10 mLs by mouth 4 (four) times daily as needed for diarrhea or loose stools. 06/08/19  Yes Darliss Cheney, MD  famotidine-calcium carbonate-magnesium hydroxide (PEPCID COMPLETE) 10-800-165 MG chewable tablet Chew 1 tablet by mouth daily as needed.   Yes [provider]  Infant Care Products (DERMACLOUD) CREA Apply 1 application topically daily as needed (for Buttocks rash).   Yes [provider]  levothyroxine (SYNTHROID) 100 MCG tablet Take 100 mcg by mouth daily before breakfast.   Yes [provider]  loperamide (IMODIUM A-D) 2 MG tablet Take 2 mg by mouth 4 (four) times daily as needed for diarrhea or loose stools.   Yes [provider]  metoprolol succinate (TOPROL-XL) 25 MG 24 hr tablet TAKE 1 TABLET(25 MG) BY MOUTH TWICE DAILY Patient taking differently: Take 12.5 mg by mouth 2 (two) times daily.  06/25/19  Yes Larey Dresser, MD  Multiple Vitamins-Minerals (PRESERVISION AREDS 2 PO) Take 2 tablets by mouth daily.    Yes [provider]  pantoprazole (PROTONIX) 20 MG tablet Take 1 tablet (20 mg total) by mouth daily. 07/27/19 07/26/20 Yes Simmons, Brittainy M, PA-C  saccharomyces boulardii (FLORASTOR)  250 MG capsule Take 250 mg by mouth 2 (two) times daily.   Yes [provider]  traMADol-acetaminophen (ULTRACET) 37.5-325 MG tablet Take 1 tablet by mouth every 6 (six) hours as needed. Patient taking differently: Take 1 tablet by mouth every 6 (six) hours as needed for moderate pain.  06/08/19  Yes Pahwani, Einar Grad, MD  carboxymethylcellulose (REFRESH PLUS) 0.5 % SOLN Place 1 drop into both eyes daily as needed (dry eye).     [provider]    Physical Exam: Vitals:   07/30/19 2300 07/30/19 2315 07/30/19 2330 07/30/19 2345  BP: (!) 117/46 (!) 112/42 (!) 115/47 (!) 120/47  Pulse: (!) 54 (!) 55 (!) 53 (!) 54  Resp: 17 16 (!) 27 13  Temp:      TempSrc:      SpO2: 99% 96% 98% 98%    Constitutional: NAD, calm, comfortable Vitals:   07/30/19 2300 07/30/19 2315 07/30/19 2330 07/30/19 2345  BP: (!) 117/46 (!) 112/42 (!) 115/47 (!) 120/47  Pulse: (!) 54 (!) 55 (!) 53 (!) 54  Resp: 17 16 (!) 27 13  Temp:      TempSrc:      SpO2: 99% 96% 98% 98%   Eyes: PERRL, lids and conjunctivae normal ENMT: Mucous membranes are moist. Posterior pharynx clear of any exudate or lesions.Normal dentition.  Neck: normal, supple, no masses, no thyromegaly Respiratory: clear to auscultation bilaterally, no wheezing, no crackles. Normal respiratory effort. No accessory muscle use.  Cardiovascular: Regular rate and rhythm, no murmurs / rubs / gallops. +++ extremity edema. 2+ pedal pulses. No carotid bruits.  Abdomen: no tenderness, no masses palpated. No hepatosplenomegaly. Bowel sounds positive.  Musculoskeletal: no clubbing / cyanosis. No joint deformity upper and lower extremities. Good ROM, no contractures. Normal muscle tone.  Skin: no rashes, lesions, ulcers. No induration Neurologic: CN 2-12 grossly intact. Sensation intact, DTR normal. Strength 5/5 in all 4.  Psychiatric: Unable to perform   Labs on Admission: I have personally reviewed following labs and imaging studies  CBC:  Recent Labs  Lab 07/27/19 1500 07/30/19 1719 07/30/19 1821  WBC 10.6* 15.5*  --   NEUTROABS  --  12.3*  --   HGB 10.9* 10.1* 10.5*  HCT 36.7 32.9* 31.0*  MCV 96.3 91.9  --   PLT 422* 360  --    Basic Metabolic Panel: Recent Labs  Lab 07/27/19 1500 07/30/19 1719 07/30/19 1821  NA 137 136 137  K 3.5 3.9 3.9  CL 111 108 111  CO2 11* 10*  --   GLUCOSE 99 103* 98  BUN 72* 95* 107*  CREATININE 5.34* 7.09* 7.30*  CALCIUM 8.3* 8.0*  --    GFR: Estimated Creatinine Clearance: 3.9 mL/min (A) (by C-G formula based on SCr of 7.3 mg/dL (H)). Liver Function Tests: Recent Labs  Lab 07/27/19 1500  AST 11*  ALT 6  ALKPHOS 235*  BILITOT 0.6  PROT 6.5  ALBUMIN 2.6*   No results for input(s): LIPASE, AMYLASE in  the last 168 hours. No results for input(s): AMMONIA in the last 168 hours. Coagulation Profile: No results for input(s): INR, PROTIME in the last 168 hours. Cardiac Enzymes: No results for input(s): CKTOTAL, CKMB, CKMBINDEX, TROPONINI in the last 168 hours. BNP (last 3 results) No results for input(s): PROBNP in the last 8760 hours. HbA1C: No results for input(s): HGBA1C in the last 72 hours. CBG: No results for input(s): GLUCAP in the last 168 hours. Lipid Profile: No results for input(s): CHOL, HDL, LDLCALC, TRIG, CHOLHDL, LDLDIRECT in the last 72 hours. Thyroid Function Tests: No results for input(s): TSH, T4TOTAL, FREET4, T3FREE, THYROIDAB in the last 72 hours. Anemia Panel: No results for input(s): VITAMINB12, FOLATE, FERRITIN, TIBC, IRON, RETICCTPCT in the last 72 hours. Urine analysis:    Component Value Date/Time   COLORURINE YELLOW 06/03/2019 0827   APPEARANCEUR HAZY (A) 06/03/2019 0827   LABSPEC 1.021 06/03/2019 0827   PHURINE 5.0 06/03/2019 0827   GLUCOSEU NEGATIVE 06/03/2019 0827   HGBUR NEGATIVE 06/03/2019 0827   BILIRUBINUR NEGATIVE 06/03/2019 0827   KETONESUR NEGATIVE 06/03/2019 0827   PROTEINUR NEGATIVE 06/03/2019 0827   NITRITE NEGATIVE  06/03/2019 0827   LEUKOCYTESUR SMALL (A) 06/03/2019 0827    Radiological Exams on Admission: US Renal  Result Date: 07/30/2019 CLINICAL DATA:  Renal failure EXAM: RENAL / URINARY TRACT ULTRASOUND COMPLETE COMPARISON:  CT abdomen 05/30/2019 FINDINGS: Right Kidney: Renal measurements: 8.2 by 5.4 by 3.4 cm = volume: 78 mL. Right renal cystic lesions of mildly varying complexity as shown on recent CT, probably complex cysts I without obvious internal blood flow although enhancement characteristics have not been recently assessed on CT. Renal echogenicity within normal limits. No stones or hydronephrosis. No mass or hydronephrosis. No definite calculi. Left Kidney: Renal measurements: 8.9 by 4.9 by 3.8 cm = volume: 86 mL. Left renal cystic lesions including a mid kidney cyst measuring 2.7 by 2.2 cm with some mild complexity internally, but no obvious internal blood flow. No mass or hydronephrosis. Bladder: Appears normal for degree of bladder distention. Other: None. IMPRESSION: 1. Some simple and some complex bilateral renal lesions as shown on recent CT, most likely cysts and without definite internal blood flow. Surveillance imaging may be warranted. 2. No hydronephrosis or urinary tract calculi identified. Electronically Signed   By: Van Clines M.D.   On: 07/30/2019 19:52   Dg Chest Port 1 View  Result Date: 07/30/2019 CLINICAL DATA:  Shortness of breath EXAM: PORTABLE CHEST 1 VIEW COMPARISON:  06/04/2019 FINDINGS: There is a subacute appearing fracture involving the proximal right humerus. This was likely present on the prior study. There is no pneumothorax. There is atelectasis at the left lung base with a small left-sided pleural effusion. There is no definite acute osseous abnormality. There are subacute rib fractures involving the seventh and eighth ribs posteriorly on the right. There is no pneumothorax. IMPRESSION: 1. Low lung volumes. Persistent atelectasis with a small left-sided  effusion at the left lung base. Probable trace to small right-sided pleural effusion. 2. Subacute healing right humeral fracture. 3. Subacute right-sided rib fractures involving the seventh and eighth ribs posteriorly. Electronically Signed   By: Constance Holster M.D.   On: 07/30/2019 17:43    EKG: Independently reviewed.   Assessment/Plan Principal Problem:   Acute kidney injury superimposed on chronic kidney disease ([place]) Active Problems:   Hypothyroidism   Essential hypertension   GERD   BREAST CANCER, HX OF   CKD (chronic kidney disease), stage V ([place])  Iron deficiency anemia due to chronic blood loss   Diarrhea   Metabolic acidosis   Acute kidney injury (AKI) with acute tubular necrosis (ATN) ([facility])     Acute kidney injury superimposed on chronic kidney disease stage V Worsening of kidney functions 72/5.3  95/7.0  107/7.3 Patient with congestive heart failure systolic dysfunction was on Lasix 40 mg BID Evaluated by cardiology recently  /the Lasix  on hold because of the worsening of the kidney function Not eating and drinking per family She has  diarrhea for the last 2 weeks 1-2 bowel movement per day treated with Imodium Patient looks dry CT scan abdomen and pelvis no sign of obstruction Emergency room physician consult nephrology Who recommended small boluses of 250 cc of Ringer lactate Patient not a candidate for dialysis  Severe metabolic acidosis secondary to kidney disease CO2 12, with anion gap 18 Plan sodium bicarb 75 cc/h Reevaluate in the morning  GERD PPI  Hypothyroidism Resume Synthroid   Essential hypertension Patient  normal blood pressure Resume home medication patient is n.p.o. now  Chronic congestive heart failure with systolic dysfunction On chronic Lasix on hold We will hold the Lasix for now the patient does not look in CHF  Diarrhea Patient no recent antibiotics On Imodium in the nursing home May need to rule out C. difficile   DVT  prophylaxis: Heparin subcut Code Status: DNR Family Communication: Daughter in the room Disposition Plan: Discharge home Consults called: Nephrologist by emergency room physician Admission status: Full admission   Jaxyn Mestas G Shanice Poznanski MD [facility]  If 7PM-7AM, please contact night-coverage [email or web address]   07/31/2019, 12:38 AM

## 2019-07-31 NOTE — Progress Notes (Signed)
[facility]                                                                              Patient Demographics  Mary Guzman, is a 85 y.o. female, DOB - [birth year], QJF:[id number]  Admit date - 07/30/2019   Admitting Physician Assunta Found, MD  Outpatient Primary MD for the patient is [name], NP       Seen on f/u. Admitted today. Chart reviewed and noted.  Principal Problem:   Acute kidney injury superimposed on chronic kidney disease ([place]) Active Problems:   Hypothyroidism   Essential hypertension   GERD   BREAST CANCER, HX OF   CKD (chronic kidney disease), stage V ([facility])   Chronic systolic CHF (congestive heart failure) ([facility])   Iron deficiency anemia due to chronic blood loss   Diarrhea   Metabolic acidosis   Acute kidney injury (AKI) with acute tubular necrosis (ATN) ([place])  Patient is a 85 years old with history of CHF with systolic dysfxn, CKD V,  not a candidate for dialysis, presenting with poor oral intake and lethargy associated with worsening of the kidney function/chemistries-  BUN107 creatinine 7.3 and metabolic acidosis with CO2 11. Patient was started on lasix 2 days prior to presentation by cardiologist. Patient admitted for MS change/lethargy due to Acute on Chronic Kidney injury with Metabolic Acidosis, and poss UTI.  ER physician discussed the case with nephrology no indication for dialysis Gentle hydration with boluses of Ringer lactate, bicarb drips Patient is DNR.  She is awake and responsive this morning- resume IVF and f/u clinically. Dtr updated at bedside.    Medications  Scheduled Meds: . B-complex with vitamin C  1 tablet Oral Daily  . feeding supplement (NEPRO CARB STEADY)  237 mL Oral Q24H  . heparin  5,000 Units Subcutaneous q12n4p  . sodium chloride flush  3 mL Intravenous Q12H   Continuous Infusions: . sodium chloride     PRN Meds:.sodium chloride, sodium chloride flush   Antibiotics   Anti-infectives  (From admission, onward)   None        Subjective:   Mary Guzman was seen and examined today.No acute event(s) reported overnight  Objective:   Vitals:   07/31/19 0115 07/31/19 0130 07/31/19 0230 07/31/19 0900  BP: (!) 122/51  125/62 (!) 115/49  Pulse: (!) 57 (!) 53 60 (!) 58  Resp: 18 13 16 18   Temp:   97.9 F (36.6 C) 98 F (36.7 C)  TempSrc:   Oral Oral  SpO2: 97% 97% 97% 98%    Intake/Output Summary (Last 24 hours) at 07/31/2019 1312 Last data filed at 07/31/2019 8937 Gross per 24 hour  Intake 458.38 ml  Output -  Net 458.38 ml     Wt Readings from Last 3 Encounters:  07/27/19 57.2 kg  07/06/19 60.8 kg  06/08/19 70.4 kg     Exam  General: NAD  HEENT: NCAT,  PERRL, Dry MM  Neck: SUPPLE, (-) JVD  Cardiovascular: RRR, (-) GALLOP, (-) MURMUR  Respiratory: CTA  Gastrointestinal: SOFT, (-) DISTENSION, BS(+), (_) TENDERNESS  Ext: (-) CYANOSIS, (-) EDEMA CNS: awake and responsive Data  Reviewed:  I have personally reviewed following labs and imaging studies  Micro Results Recent Results (from the past 240 hour(s))  SARS CORONAVIRUS 2 (TAT 6-24 HRS) Nasopharyngeal Nasopharyngeal Swab     Status: None   Collection Time: 07/30/19  5:27 PM   Specimen: Nasopharyngeal Swab  Result Value Ref Range Status   SARS Coronavirus 2 NEGATIVE NEGATIVE Final    Comment: (NOTE) SARS-CoV-2 target nucleic acids are NOT DETECTED. The SARS-CoV-2 RNA is generally detectable in upper and lower respiratory specimens during the acute phase of infection. Negative results do not preclude SARS-CoV-2 infection, do not rule out co-infections with other pathogens, and should not be used as the sole basis for treatment or other patient management decisions. Negative results must be combined with clinical observations, patient history, and epidemiological information. The expected result is Negative. Fact Sheet for Patients: SugarRoll.be Fact Sheet  for Healthcare Providers: https://www.woods-mathews.com/ This test is not yet approved or cleared by the [place] FDA and  has been authorized for detection and/or diagnosis of SARS-CoV-2 by FDA under an Emergency Use Authorization (EUA). This EUA will remain  in effect (meaning this test can be used) for the duration of the COVID-19 declaration under Section 56 4(b)(1) of the Act, 21 U.S.C. section 360bbb-3(b)(1), unless the authorization is terminated or revoked sooner. Performed at [facility], [place] [address] [place], [place] [address]     Radiology Reports US Renal  Result Date: 07/30/2019 CLINICAL DATA:  Renal failure EXAM: RENAL / URINARY TRACT ULTRASOUND COMPLETE COMPARISON:  CT abdomen 05/30/2019 FINDINGS: Right Kidney: Renal measurements: 8.2 by 5.4 by 3.4 cm = volume: 78 mL. Right renal cystic lesions of mildly varying complexity as shown on recent CT, probably complex cysts I without obvious internal blood flow although enhancement characteristics have not been recently assessed on CT. Renal echogenicity within normal limits. No stones or hydronephrosis. No mass or hydronephrosis. No definite calculi. Left Kidney: Renal measurements: 8.9 by 4.9 by 3.8 cm = volume: 86 mL. Left renal cystic lesions including a mid kidney cyst measuring 2.7 by 2.2 cm with some mild complexity internally, but no obvious internal blood flow. No mass or hydronephrosis. Bladder: Appears normal for degree of bladder distention. Other: None. IMPRESSION: 1. Some simple and some complex bilateral renal lesions as shown on recent CT, most likely cysts and without definite internal blood flow. Surveillance imaging may be warranted. 2. No hydronephrosis or urinary tract calculi identified. Electronically Signed   By: Van Clines M.D.   On: 07/30/2019 19:52   Dg Chest Port 1 View  Result Date: 07/30/2019 CLINICAL DATA:  Shortness of breath EXAM: PORTABLE CHEST 1 VIEW COMPARISON:   06/04/2019 FINDINGS: There is a subacute appearing fracture involving the proximal right humerus. This was likely present on the prior study. There is no pneumothorax. There is atelectasis at the left lung base with a small left-sided pleural effusion. There is no definite acute osseous abnormality. There are subacute rib fractures involving the seventh and eighth ribs posteriorly on the right. There is no pneumothorax. IMPRESSION: 1. Low lung volumes. Persistent atelectasis with a small left-sided effusion at the left lung base. Probable trace to small right-sided pleural effusion. 2. Subacute healing right humeral fracture. 3. Subacute right-sided rib fractures involving the seventh and eighth ribs posteriorly. Electronically Signed   By: Constance Holster M.D.   On: 07/30/2019 17:43    Lab Data:  CBC: Recent Labs  Lab 07/27/19 1500 07/30/19 1719 07/30/19 1821 07/31/19 9233  WBC 10.6* 15.5*  --  12.5*  NEUTROABS  --  12.3*  --   --   HGB 10.9* 10.1* 10.5* 10.3*  HCT 36.7 32.9* 31.0* 32.7*  MCV 96.3 91.9  --  88.9  PLT 422* 360  --  510   Basic Metabolic Panel: Recent Labs  Lab 07/27/19 1500 07/30/19 1719 07/30/19 1821 07/31/19 0337  NA 137 136 137 136  K 3.5 3.9 3.9 3.7  CL 111 108 111 107  CO2 11* 10*  --  12*  GLUCOSE 99 103* 98 97  BUN 72* 95* 107* 97*  CREATININE 5.34* 7.09* 7.30* 7.23*  CALCIUM 8.3* 8.0*  --  7.8*  MG  --   --   --  2.1  PHOS  --   --   --  5.8*   GFR: Estimated Creatinine Clearance: 3.9 mL/min (A) (by C-G formula based on SCr of 7.23 mg/dL (H)). Liver Function Tests: Recent Labs  Lab 07/27/19 1500 07/31/19 0337  AST 11* 9*  ALT 6 <5  ALKPHOS 235* 153*  BILITOT 0.6 0.3  PROT 6.5 5.2*  ALBUMIN 2.6* 2.2*   No results for input(s): LIPASE, AMYLASE in the last 168 hours. No results for input(s): AMMONIA in the last 168 hours. Coagulation Profile: No results for input(s): INR, PROTIME in the last 168 hours. Cardiac Enzymes: No results for  input(s): CKTOTAL, CKMB, CKMBINDEX, TROPONINI in the last 168 hours. BNP (last 3 results) No results for input(s): PROBNP in the last 8760 hours. HbA1C: No results for input(s): HGBA1C in the last 72 hours. CBG: No results for input(s): GLUCAP in the last 168 hours. Lipid Profile: No results for input(s): CHOL, HDL, LDLCALC, TRIG, CHOLHDL, LDLDIRECT in the last 72 hours. Thyroid Function Tests: No results for input(s): TSH, T4TOTAL, FREET4, T3FREE, THYROIDAB in the last 72 hours. Anemia Panel: No results for input(s): VITAMINB12, FOLATE, FERRITIN, TIBC, IRON, RETICCTPCT in the last 72 hours. Urine analysis:    Component Value Date/Time   COLORURINE YELLOW 07/31/2019 0650   APPEARANCEUR HAZY (A) 07/31/2019 0650   LABSPEC 1.013 07/31/2019 0650   PHURINE 5.0 07/31/2019 0650   GLUCOSEU NEGATIVE 07/31/2019 0650   HGBUR NEGATIVE 07/31/2019 0650   [place] 07/31/2019 [place] 07/31/2019 0650   PROTEINUR 30 (A) 07/31/2019 0650   NITRITE NEGATIVE 07/31/2019 0650   LEUKOCYTESUR LARGE (A) 07/31/2019 0650     Benito Mccreedy M.D. [facility] 07/31/2019, 1:12 PM  Pager: [phone] Between 7am to 7pm - call Pager - [phone]  After 7pm go to [email or web address] - password [email or web address]  Call night coverage person covering after 7pm

## 2019-07-31 NOTE — Progress Notes (Signed)
Initial Nutrition Assessment  DOCUMENTATION CODES:   Not applicable  INTERVENTION:  -Nepro Shake po daily, each supplement provides 425 kcal and 19 grams protein -Magic cup TID with meals, each supplement provides 290 kcal and 9 grams of protein -MVI  -Recommend monitoring magnesium, potassium, and phosphorus daily for at least 3 days, MD to replete as needed, as pt is at risk for refeeding syndrome given reported limited po intake over the past 2 weeks per chart review.  NUTRITION DIAGNOSIS:   Inadequate oral intake related to chronic illness, decreased appetite(CKDV; not on HD) as evidenced by energy intake < 75% for > 7 days, per patient/family report(per chart review, facility reports 2 week history of significant poor oral intake).  GOAL:   Patient will meet greater than or equal to 90% of their needs  MONITOR:   PO intake, Labs, I & O's, Supplement acceptance, Weight trends, Skin  REASON FOR ASSESSMENT:   Malnutrition Screening Tool    ASSESSMENT:  RD working remotely.  85 year old female with medical history significant of HTN, CHF, CKDV, GERD, hypothyroidism, degenerative disc disease who presented from nursing facility for evaluation of increased lethargy, poor oral intake, diarrhea, and worsening kidney function for the past 2 weeks.  Patient admitted with AKI superimposed on CKD  Per chart review, patient has been seeing cardiology, kidney function has been getting worse and elevating BNP. Her Lasix doses have been adjusted significantly over the past several weeks. Patient initially on 40 mg BID and taken off Lasix for 2 days prior to admission. Patient is not on dialysis. Labs showed Cr 6.8, BUN 92, BNP 12000. Patient noted with +3 generalized edema per review of RN edema assessment.  Diet advanced to D3:thin s/p SLP evaluation today, no meals documented for review at this time. Will continue to monitor for po intake and provide Nepro nutrition supplement to aid  with calorie/protein needs.   Weights history reviewed; stable Medications reviewed Labs: BUN 97 (H), Cr 7.23 (H), P 5.8 (H), Corrected Ca 8.7 (L)  NUTRITION - FOCUSED PHYSICAL EXAM: Unable to complete at this time, RD working remotely.   Diet Order:   Diet Order            DIET DYS 3 Room service appropriate? Yes; Fluid consistency: Thin  Diet effective now              EDUCATION NEEDS:   No education needs have been identified at this time  Skin:  Skin Assessment: Reviewed RN Assessment(stage I;sacrum)  Last BM:  11/13  Height:   Ht Readings from Last 1 Encounters:  06/02/19 5' 4.02" (1.626 m)    Weight:   Wt Readings from Last 1 Encounters:  07/27/19 57.2 kg    Ideal Body Weight:  54.5 kg  BMI:  There is no height or weight on file to calculate BMI.  Estimated Nutritional Needs:   Kcal:  1400-1600  Protein:  70-80  Fluid:  >/= 1.4 L/day   Lajuan Lines, RD, LDN Clinical Nutrition Office [phone] After Hours/Weekend Pager: [phone]

## 2019-07-31 NOTE — Evaluation (Signed)
Clinical/Bedside Swallow Evaluation Patient Details  Name: Mary Guzman MRN: [id number] Date of Birth: June 01, 1921  Today's Date: 07/31/2019 Time: SLP Start Time (ACUTE ONLY): 0825 SLP Stop Time (ACUTE ONLY): 0848 SLP Time Calculation (min) (ACUTE ONLY): 23 min  Past Medical History:  Past Medical History:  Diagnosis Date  . 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  . DJD (degenerative joint disease)   . GERD (gastroesophageal reflux disease)   . Hypertension   . Osteoporosis   . Thyroid disease    Hypothyroidism   Past Surgical History:  Past Surgical History:  Procedure Laterality Date  . ANKLE SURGERY     Right  . INTRACAPSULAR CATARACT EXTRACTION    . LAPAROSCOPIC APPENDECTOMY N/A 06/02/2019   Procedure: APPENDECTOMY LAPAROSCOPIC;  Surgeon: Alphonsa Overall, MD;  Location: [facility];  Service: General;  Laterality: N/A;  . MASTECTOMY  1994   HPI:  85 y.o. female with medical history significant of hypertension congestive heart failure GERD hypothyroidism nursing home resident, DNR status, came on 07/30/19 with a chief complaint of increase of lethargy, not eating and drinking, diarrhea for the last 2 weeks, and worsening of the kidney function CXR on 07/30/19 indicated:low lung volumes with persistent atelectasis with small left pleural effusion at left lung base and trace right pleural effusion; pt NPO status, so BSE generated.  Assessment / Plan / Recommendation Clinical Impression  Pt exhibited slow thorough mastication with solids, decreased oral manipulation/propulsion and delay in the initation of the swallow (which may be secondary to presbyphagia and xerostomia); no overt s/s of aspiration noted with any consistency assessed; pt independently initiated a chin tuck during consumption which could be related to GERD hx?; recommend general swallowing precautions and initiate Dysphagia 3 (mechanical soft) diet with thin  liquids; pt wears dentures, but did not have them available for BSE; ST will f/u x1 for diet tolerance and education re: swallowing safety/precautions; thank you for this consult. SLP Visit Diagnosis: Dysphagia, unspecified (R13.10)    Aspiration Risk  Mild aspiration risk    Diet Recommendation   Dysphagia 3/thin liquids (mechanical/soft)  Medication Administration: Whole meds with liquid    Other  Recommendations Oral Care Recommendations: Oral care QID;Other (Comment)(until xerostomia improved)   Follow up Recommendations Skilled Nursing facility      Frequency and Duration min 1 x/week  1 week       Prognosis Prognosis for Safe Diet Advancement: Good      Swallow Study   General Date of Onset: 07/30/19 HPI: 85 y.o. female with medical history significant of hypertension congestive heart failure GERD hypothyroidism nursing home resident, DNR status, came with a chief complaint of increase of lethargy, not eating and drinking, diarrhea for the last 2 weeks, and worsening of the kidney function Type of Study: Bedside Swallow Evaluation Previous Swallow Assessment: (none noted) Diet Prior to this Study: NPO Temperature Spikes Noted: No Respiratory Status: Room air History of Recent Intubation: No Behavior/Cognition: Alert;Cooperative;Requires cueing Oral Cavity Assessment: Dry Oral Care Completed by SLP: Yes Oral Cavity - Dentition: Edentulous Self-Feeding Abilities: Needs assist Patient Positioning: Upright in bed Baseline Vocal Quality: Hoarse;Other (comment)(minimal d/t xerostomia) Volitional Cough: Other (Comment)(not initiated d/t Covid restrictions) Volitional Swallow: Able to elicit    Oral/Motor/Sensory Function Overall Oral Motor/Sensory Function: Generalized oral weakness Facial ROM: Within Functional Limits Facial Symmetry: Within Functional Limits Facial Strength: Within Functional Limits Facial Sensation: Within Functional Limits   Ice Chips  Ice chips:  Within functional limits   Thin Liquid Thin Liquid: Within functional limits Presentation: Straw    Nectar Thick Nectar Thick Liquid: Not tested   Honey Thick Honey Thick Liquid: Not tested   Puree Puree: Impaired Presentation: Spoon Oral Phase Impairments: Reduced lingual movement/coordination Oral Phase Functional Implications: Prolonged oral transit Pharyngeal Phase Impairments: Suspected delayed Swallow   Solid     Solid: Impaired Presentation: Spoon Oral Phase Impairments: Reduced lingual movement/coordination Oral Phase Functional Implications: Prolonged oral transit;Impaired mastication Pharyngeal Phase Impairments: Suspected delayed Swallow;Other (comments)(liquid wash)      Elvina Sidle, M.S., CCC-SLP 07/31/2019,9:02 AM

## 2019-08-01 LAB — CBC
HCT: 32.1 % — ABNORMAL LOW (ref 36.0–46.0)
Hemoglobin: 10.4 g/dL — ABNORMAL LOW (ref 12.0–15.0)
MCH: 28.6 pg (ref 26.0–34.0)
MCHC: 32.4 g/dL (ref 30.0–36.0)
MCV: 88.2 fL (ref 80.0–100.0)
Platelets: 349 10*3/uL (ref 150–400)
RBC: 3.64 MIL/uL — ABNORMAL LOW (ref 3.87–5.11)
RDW: 16.5 % — ABNORMAL HIGH (ref 11.5–15.5)
WBC: 13.2 10*3/uL — ABNORMAL HIGH (ref 4.0–10.5)
nRBC: 0 % (ref 0.0–0.2)

## 2019-08-01 LAB — URINE CULTURE

## 2019-08-01 LAB — COMPREHENSIVE METABOLIC PANEL
ALT: 6 U/L (ref 0–44)
AST: 10 U/L — ABNORMAL LOW (ref 15–41)
Albumin: 2.1 g/dL — ABNORMAL LOW (ref 3.5–5.0)
Alkaline Phosphatase: 142 U/L — ABNORMAL HIGH (ref 38–126)
Anion gap: 18 — ABNORMAL HIGH (ref 5–15)
BUN: 98 mg/dL — ABNORMAL HIGH (ref 8–23)
CO2: 15 mmol/L — ABNORMAL LOW (ref 22–32)
Calcium: 7.5 mg/dL — ABNORMAL LOW (ref 8.9–10.3)
Chloride: 105 mmol/L (ref 98–111)
Creatinine, Ser: 7.38 mg/dL — ABNORMAL HIGH (ref 0.44–1.00)
GFR calc Af Amer: 5 mL/min — ABNORMAL LOW (ref >60)
GFR calc non Af Amer: 4 mL/min — ABNORMAL LOW (ref >60)
Glucose, Bld: 96 mg/dL (ref 70–99)
Potassium: 3.8 mmol/L (ref 3.5–5.1)
Sodium: 138 mmol/L (ref 135–145)
Total Bilirubin: 0.3 mg/dL (ref 0.3–1.2)
Total Protein: 5 g/dL — ABNORMAL LOW (ref 6.5–8.1)

## 2019-08-01 LAB — MAGNESIUM: Magnesium: 1.9 mg/dL (ref 1.7–2.4)

## 2019-08-01 LAB — PHOSPHORUS: Phosphorus: 5.8 mg/dL — ABNORMAL HIGH (ref 2.5–4.6)

## 2019-08-01 MED ORDER — ACETAMINOPHEN 325 MG PO TABS
650.0000 mg | ORAL_TABLET | Freq: Four times a day (QID) | ORAL | Status: DC | PRN
  Administered 2019-08-01: 650 mg via ORAL
  Filled 2019-08-01: qty 2

## 2019-08-01 NOTE — Evaluation (Signed)
Occupational Therapy Evaluation Patient Details Name: Mary Guzman MRN: [id number] DOB: 01-25-1921 Today's Date: 08/01/2019    History of Present Illness This  85 y.o. female with medical history significant of hypertension congestive heart failure, recent Rt humeral fx 9/20 GERD hypothyroidism with , DNR status, came with a chief complaint of increase of lethargy, not eating and drinking, diarrhea for the last 2 weeks, and worsening of the kidney function.  Dx: AKI superimposed on chronic kidney disease stage V.  She is not a candidate for dialysis; metabolic acidosis    Clinical Impression   Pt admitted with above. She demonstrates the below listed deficits and will benefit from continued OT to maximize safety and independence with BADLs.  Pt presents to OT with generalized weakness, decreased activity tolerance, impaired balance, pain and impaired use of Rt UE.  She requires min - total A for ADLs and mod A +2 for functional transfers.  She has been at [facility] for rehab since 05/2019, and prior to that was living alone, mod I, with daily check ins.  Recommend return to SNF for continued rehab to maximize her independence, quality of life, reduce risk of falls, and readmissions.   Recommend palliative care consult. Will follow acutely.       Follow Up Recommendations  SNF    Equipment Recommendations  None recommended by OT    Recommendations for Other Services       Precautions / Restrictions Precautions Precautions: Fall      Mobility Bed Mobility Overal bed mobility: Needs Assistance Bed Mobility: Supine to Sit;Sit to Supine     Supine to sit: Max assist Sit to supine: Max assist   General bed mobility comments: pt able to assist with lifting and lowering trunk and with LES   Transfers Overall transfer level: Needs assistance Equipment used: 2 person hand held assist Transfers: Sit to/from Stand Sit to Stand: Mod assist;+2 physical assistance;+2 safety/equipment;From  elevated surface         General transfer comment: attempted to stand with +1 assist with bed elevated, and no success as bil. LEs jerking resulting on poor control of LEs.  Using bedpad and +2 assist with bil. LEs blocked, she was able to stand with mod A +2 to lift buttocks and extend hips     Balance Overall balance assessment: Needs assistance Sitting-balance support: Feet supported Sitting balance-Leahy Scale: Fair Sitting balance - Comments: Pt was able to sit EOB with close min guard assist.  Postural control: Posterior lean Standing balance support: Bilateral upper extremity supported Standing balance-Leahy Scale: Poor Standing balance comment: requires mod A to maintain static standing                            ADL either performed or assessed with clinical judgement   ADL Overall ADL's : Needs assistance/impaired Eating/Feeding: Moderate assistance;Sitting   Grooming: Wash/dry face;Wash/dry hands;Oral care;Brushing hair;Moderate assistance;Sitting   Upper Body Bathing: Maximal assistance;Sitting   Lower Body Bathing: Total assistance;Sit to/from stand   Upper Body Dressing : Maximal assistance;Sitting   Lower Body Dressing: Total assistance;Sit to/from stand   Toilet Transfer: Moderate assistance;+2 for physical assistance;BSC   Toileting- Clothing Manipulation and Hygiene: Total assistance;Sit to/from stand Toileting - Clothing Manipulation Details (indicate cue type and reason): Pt incontinent of stool and was assisted with peri care      Functional mobility during ADLs: Moderate assistance;+2 for physical assistance;+2 for safety/equipment  Vision         Perception     Praxis      Pertinent Vitals/Pain Pain Assessment: Faces Faces Pain Scale: Hurts even more Pain Location: Rt shoulder  Pain Descriptors / Indicators: Grimacing;Guarding;Aching Pain Intervention(s): Monitored during session;Repositioned     Hand Dominance Right    Extremity/Trunk Assessment Upper Extremity Assessment Upper Extremity Assessment: RUE deficits/detail;Generalized weakness RUE Deficits / Details: Pt with h/o humeral fx in 9/20.  Did not assess shoulder ROM due to pain.  Elbow distally [facility]    Lower Extremity Assessment Lower Extremity Assessment: Defer to PT evaluation;LLE deficits/detail;RLE deficits/detail;Generalized weakness RLE Deficits / Details: what appears to be myoclonic jerking present.  Pt and son reports new onset "my legs are dancing"  LLE Deficits / Details: what appears to be myoclonic jerking present.  Pt and son reports new onset "my legs are dancing"   Cervical / Trunk Assessment Cervical / Trunk Assessment: Kyphotic   Communication Communication Communication: No difficulties   Cognition Arousal/Alertness: Awake/alert Behavior During Therapy: WFL for tasks assessed/performed Overall Cognitive Status: Within Functional Limits for tasks assessed                                 General Comments: WFL for basic info    General Comments  Pts son present for eval and was very supportive     Exercises Exercises: General Upper Extremity;General Lower Extremity General Exercises - Upper Extremity Shoulder Flexion: AROM;Left;10 reps;Seated Elbow Flexion: AROM;Right;Left;10 reps;Seated General Exercises - Lower Extremity Long Arc Quad: AROM;Left;Right;10 reps;Seated   Shoulder Instructions      Home Living Family/patient expects to be discharged to:: Skilled nursing facility                                 Additional Comments: Pt has been in SNF getting daily therapies since 05/2019.  Prior to that, she was living alone with frequent check ins from family       Prior Functioning/Environment Level of Independence: Needs assistance  Gait / Transfers Assistance Needed: Pt has been ambulating with assist from PT in the SNF  ADL's / Homemaking Assistance Needed: Pt requires assist, but is  unable to fully elaborate             OT Problem List: Decreased strength;Decreased activity tolerance;Impaired balance (sitting and/or standing);Decreased knowledge of use of DME or AE;Impaired UE functional use;Pain      OT Treatment/Interventions: Self-care/ADL training;Therapeutic exercise;Therapeutic activities;Patient/family education;Balance training;DME and/or AE instruction    OT Goals(Current goals can be found in the care plan section) Acute Rehab OT Goals Patient Stated Goal: to feel better  OT Goal Formulation: With patient/family Time For Goal Achievement: 08/15/19 ADL Goals Pt Will Perform Grooming: with set-up;sitting Pt Will Perform Upper Body Bathing: with supervision;with set-up;sitting Pt Will Transfer to Toilet: with mod assist;stand pivot transfer;bedside commode Pt Will Perform Toileting - Clothing Manipulation and hygiene: with mod assist;sit to/from stand Pt/caregiver will Perform Home Exercise Program: Increased strength;Left upper extremity;With Supervision;With written HEP provided  OT Frequency: Min 2X/week   Barriers to D/C: Decreased caregiver support          Co-evaluation PT/OT/SLP Co-Evaluation/Treatment: Yes Reason for Co-Treatment: Necessary to address cognition/behavior during functional activity;For patient/therapist safety;To address functional/ADL transfers   OT goals addressed during session: ADL's and self-care      AM-PAC OT "  6 Clicks" Daily Activity     Outcome Measure Help from another person eating meals?: A Lot Help from another person taking care of personal grooming?: A Lot Help from another person toileting, which includes using toliet, bedpan, or urinal?: A Lot Help from another person bathing (including washing, rinsing, drying)?: A Lot Help from another person to put on and taking off regular upper body clothing?: A Lot Help from another person to put on and taking off regular lower body clothing?: Total 6 Click Score:  11   End of Session Nurse Communication: Mobility status  Activity Tolerance: Patient limited by fatigue Patient left: in bed;with call bell/phone within reach;with family/visitor present  OT Visit Diagnosis: Unsteadiness on feet (R26.81);Pain Pain - Right/Left: Right Pain - part of body: Shoulder                Time: 2103-1281 OT Time Calculation (min): 40 min Charges:  OT General Charges $OT Visit: 1 Visit OT Evaluation $OT Eval Moderate Complexity: 1 Mod OT Treatments $Self Care/Home Management : 8-22 mins  Lucille Passy, OTR/L Acute Rehabilitation Services Pager [phone] Office [phone]   Lucille Passy M 08/01/2019, 4:12 PM

## 2019-08-01 NOTE — Plan of Care (Signed)
?  Problem: Clinical Measurements: ?Goal: Ability to maintain clinical measurements within normal limits will improve ?Outcome: Not Progressing ?  ?

## 2019-08-01 NOTE — Progress Notes (Signed)
NT bladder scanned patient, scanning was = 43mL.   Farley LyRN

## 2019-08-01 NOTE — Evaluation (Signed)
Physical Therapy Evaluation Patient Details Name: Mary Guzman MRN: [id number] DOB: 06-18-1921 Today's Date: 08/01/2019   History of Present Illness  This  85 y.o. female with medical history significant of hypertension congestive heart failure, recent Rt humeral fx 9/20 GERD hypothyroidism with , DNR status, came with a chief complaint of increase of lethargy, not eating and drinking, diarrhea for the last 2 weeks, and worsening of the kidney function.  Dx: AKI superimposed on chronic kidney disease stage V.  She is not a candidate for dialysis; metabolic acidosis   Clinical Impression   Pt admitted with above diagnosis. She has been at [facility] for rehab since 05/2019, and prior to that was living alone, mod I, with daily check ins.  Presents with decr independence and safety with functional mobility and ADLs; Recommend return to SNF for continued rehab to maximize her independence, quality of life, reduce risk of falls, and readmissions.   Recommend palliative care consult. Pt currently with functional limitations due to the deficits listed below (see PT Problem List). Pt will benefit from skilled PT to increase their independence and safety with mobility to allow discharge to the venue listed below.       Follow Up Recommendations SNF;Other (comment)(for continuing rehab)    Equipment Recommendations  Other (comment)(to be determined at post-acute rehab)    Recommendations for Other Services       Precautions / Restrictions Precautions Precautions: Fall Restrictions Weight Bearing Restrictions: Yes RUE Weight Bearing: Non weight bearing      Mobility  Bed Mobility Overal bed mobility: Needs Assistance Bed Mobility: Supine to Sit;Sit to Supine     Supine to sit: Max assist Sit to supine: Max assist   General bed mobility comments: pt able to assist with lifting and lowering trunk and with LES   Transfers Overall transfer level: Needs assistance Equipment used: 2 person hand  held assist Transfers: Sit to/from Stand Sit to Stand: Mod assist;+2 physical assistance;+2 safety/equipment;From elevated surface         General transfer comment: attempted to stand with +1 assist with bed elevated, and no success as bil. LEs jerking resulting on poor control of LEs.  Using bedpad and +2 assist with bil. LEs blocked, she was able to stand with mod A +2 to lift buttocks and extend hips   Ambulation/Gait                Stairs            Wheelchair Mobility    Modified Rankin (Stroke Patients Only)       Balance Overall balance assessment: Needs assistance Sitting-balance support: Feet supported Sitting balance-Leahy Scale: Fair Sitting balance - Comments: Pt was able to sit EOB with close min guard assist.  Postural control: Posterior lean Standing balance support: Bilateral upper extremity supported Standing balance-Leahy Scale: Poor Standing balance comment: requires mod A to maintain static standing                              Pertinent Vitals/Pain Pain Assessment: Faces Faces Pain Scale: Hurts even more Pain Location: Rt shoulder  Pain Descriptors / Indicators: Grimacing;Guarding;Aching Pain Intervention(s): Monitored during session    Home Living Family/patient expects to be discharged to:: Skilled nursing facility                 Additional Comments: Pt has been in SNF getting daily therapies since 05/2019.  Prior to that,  she was living alone with frequent check ins from family     Prior Function Level of Independence: Needs assistance   Gait / Transfers Assistance Needed: Pt has been ambulating with assist from PT in the SNF   ADL's / Homemaking Assistance Needed: Pt requires assist, but is unable to fully elaborate         Hand Dominance   Dominant Hand: Right    Extremity/Trunk Assessment   Upper Extremity Assessment Upper Extremity Assessment: Defer to OT evaluation RUE Deficits / Details: Pt with  h/o humeral fx in 9/20.  Did not assess shoulder ROM due to pain.  Elbow distally [facility]     Lower Extremity Assessment Lower Extremity Assessment: Generalized weakness RLE Deficits / Details: what appears to be myoclonic jerking present.  Pt and son reports new onset "my legs are dancing"  LLE Deficits / Details: what appears to be myoclonic jerking present.  Pt and son reports new onset "my legs are dancing"    Cervical / Trunk Assessment Cervical / Trunk Assessment: Kyphotic  Communication   Communication: No difficulties  Cognition Arousal/Alertness: Awake/alert Behavior During Therapy: WFL for tasks assessed/performed Overall Cognitive Status: Within Functional Limits for tasks assessed                                 General Comments: WFL for basic info       General Comments General comments (skin integrity, edema, etc.): Pts son present for eval and was very supportive     Exercises General Exercises - Upper Extremity Shoulder Flexion: AROM;Left;10 reps;Seated Elbow Flexion: AROM;Right;Left;10 reps;Seated General Exercises - Lower Extremity Long Arc Quad: AROM;Left;Right;10 reps;Seated   Assessment/Plan    PT Assessment Patient needs continued PT services  PT Problem List Decreased strength;Decreased range of motion;Decreased activity tolerance;Decreased mobility;Decreased balance;Decreased coordination;Decreased knowledge of use of DME;Decreased safety awareness;Decreased knowledge of precautions;Pain;Cardiopulmonary status limiting activity;Impaired tone       PT Treatment Interventions DME instruction;Gait training;Functional mobility training;Therapeutic activities;Therapeutic exercise;Balance training;Neuromuscular re-education;Cognitive remediation;Patient/family education    PT Goals (Current goals can be found in the Care Plan section)  Acute Rehab PT Goals Patient Stated Goal: to feel better  PT Goal Formulation: With patient Time For Goal  Achievement: 08/15/19 Potential to Achieve Goals: Good    Frequency Min 2X/week   Barriers to discharge        Co-evaluation PT/OT/SLP Co-Evaluation/Treatment: Yes Reason for Co-Treatment: Necessary to address cognition/behavior during functional activity PT goals addressed during session: Mobility/safety with mobility OT goals addressed during session: ADL's and self-care       AM-PAC PT "6 Clicks" Mobility  Outcome Measure Help needed turning from your back to your side while in a flat bed without using bedrails?: A Little Help needed moving from lying on your back to sitting on the side of a flat bed without using bedrails?: A Lot Help needed moving to and from a bed to a chair (including a wheelchair)?: A Lot Help needed standing up from a chair using your arms (e.g., wheelchair or bedside chair)?: A Lot Help needed to walk in hospital room?: A Lot Help needed climbing 3-5 steps with a railing? : Total 6 Click Score: 12    End of Session Equipment Utilized During Treatment: Other (comment)(bed pad) Activity Tolerance: Patient tolerated treatment well Patient left: in bed;with call bell/phone within reach;with family/visitor present Nurse Communication: Mobility status PT Visit Diagnosis: Unsteadiness on feet (R26.81);Other  abnormalities of gait and mobility (R26.89);History of falling (Z91.81)    Time: 1527-1550 PT Time Calculation (min) (ACUTE ONLY): 23 min   Charges:   PT Evaluation $PT Eval Moderate Complexity: 1 Mod          Roney [place], [place]  Acute Rehabilitation Services Pager [phone] Office [phone]   Colletta [place] 08/01/2019, 4:51 PM

## 2019-08-02 LAB — COMPREHENSIVE METABOLIC PANEL
ALT: 6 U/L (ref 0–44)
ALT: 6 U/L (ref 0–44)
AST: 12 U/L — ABNORMAL LOW (ref 15–41)
AST: 14 U/L — ABNORMAL LOW (ref 15–41)
Albumin: 2 g/dL — ABNORMAL LOW (ref 3.5–5.0)
Albumin: 2.2 g/dL — ABNORMAL LOW (ref 3.5–5.0)
Alkaline Phosphatase: 140 U/L — ABNORMAL HIGH (ref 38–126)
Alkaline Phosphatase: 156 U/L — ABNORMAL HIGH (ref 38–126)
Anion gap: 20 — ABNORMAL HIGH (ref 5–15)
Anion gap: 21 — ABNORMAL HIGH (ref 5–15)
BUN: 101 mg/dL — ABNORMAL HIGH (ref 8–23)
BUN: 103 mg/dL — ABNORMAL HIGH (ref 8–23)
CO2: 16 mmol/L — ABNORMAL LOW (ref 22–32)
CO2: 17 mmol/L — ABNORMAL LOW (ref 22–32)
Calcium: 7.4 mg/dL — ABNORMAL LOW (ref 8.9–10.3)
Calcium: 7.6 mg/dL — ABNORMAL LOW (ref 8.9–10.3)
Chloride: 103 mmol/L (ref 98–111)
Chloride: 100 mmol/L (ref 98–111)
Creatinine, Ser: 8.16 mg/dL — ABNORMAL HIGH (ref 0.44–1.00)
Creatinine, Ser: 8.3 mg/dL — ABNORMAL HIGH (ref 0.44–1.00)
GFR calc Af Amer: 4 mL/min — ABNORMAL LOW (ref >60)
GFR calc Af Amer: 4 mL/min — ABNORMAL LOW (ref >60)
GFR calc non Af Amer: 4 mL/min — ABNORMAL LOW (ref >60)
GFR calc non Af Amer: 4 mL/min — ABNORMAL LOW (ref >60)
Glucose, Bld: 87 mg/dL (ref 70–99)
Glucose, Bld: 98 mg/dL (ref 70–99)
Potassium: 3.6 mmol/L (ref 3.5–5.1)
Potassium: 3.9 mmol/L (ref 3.5–5.1)
Sodium: 139 mmol/L (ref 135–145)
Sodium: 138 mmol/L (ref 135–145)
Total Bilirubin: 0.5 mg/dL (ref 0.3–1.2)
Total Bilirubin: 0.4 mg/dL (ref 0.3–1.2)
Total Protein: 4.9 g/dL — ABNORMAL LOW (ref 6.5–8.1)
Total Protein: 5.6 g/dL — ABNORMAL LOW (ref 6.5–8.1)

## 2019-08-02 LAB — CBC
HCT: 32.5 % — ABNORMAL LOW (ref 36.0–46.0)
Hemoglobin: 10.5 g/dL — ABNORMAL LOW (ref 12.0–15.0)
MCH: 28 pg (ref 26.0–34.0)
MCHC: 32.3 g/dL (ref 30.0–36.0)
MCV: 86.7 fL (ref 80.0–100.0)
Platelets: 343 10*3/uL (ref 150–400)
RBC: 3.75 MIL/uL — ABNORMAL LOW (ref 3.87–5.11)
RDW: 16.5 % — ABNORMAL HIGH (ref 11.5–15.5)
WBC: 12.6 10*3/uL — ABNORMAL HIGH (ref 4.0–10.5)
nRBC: 0 % (ref 0.0–0.2)

## 2019-08-02 LAB — PHOSPHORUS: Phosphorus: 6.1 mg/dL — ABNORMAL HIGH (ref 2.5–4.6)

## 2019-08-02 LAB — MAGNESIUM: Magnesium: 1.9 mg/dL (ref 1.7–2.4)

## 2019-08-02 MED ORDER — LOPERAMIDE HCL 1 MG/7.5ML PO SUSP
2.0000 mg | ORAL | Status: DC | PRN
  Administered 2019-08-02 – 2019-08-04 (×3): 2 mg via ORAL
  Filled 2019-08-02 (×4): qty 15

## 2019-08-02 MED ORDER — SODIUM CHLORIDE 0.9 % IV SOLN: INTRAVENOUS | Status: DC

## 2019-08-02 MED ORDER — SODIUM CHLORIDE 0.9% FLUSH
10.0000 mL | Freq: Two times a day (BID) | INTRAVENOUS | Status: DC
  Administered 2019-08-02 – 2019-08-03 (×2): 10 mL

## 2019-08-02 MED ORDER — SODIUM CHLORIDE 0.9% FLUSH: 10.0000 mL | INTRAVENOUS | Status: DC | PRN

## 2019-08-02 MED ORDER — SODIUM BICARBONATE 8.4 % IV SOLN
INTRAVENOUS | Status: DC
  Administered 2019-08-02 – 2019-08-03 (×4): via INTRAVENOUS
  Filled 2019-08-02 (×4): qty 100

## 2019-08-02 NOTE — Progress Notes (Signed)
IV team RN unable to obtain access. MD Dr. Doristine Bosworth notified. Advised to get second IV team RN to access. Pt needs fluids until decision is made for hospice. Notified IV team RN.

## 2019-08-02 NOTE — Progress Notes (Signed)
[facility]                                                                              Patient Demographics  Mary Guzman, is a 85 y.o. female, DOB - [birth year], XMI:[id number]  Admit date - 07/30/2019   Admitting Physician Assunta Found, MD  Outpatient Primary MD for the patient is [name], NP  Patient is a 85 years old with history of CHF with systolic dysfxn, CKD V,  not a candidate for dialysis, presenting with poor oral intake and lethargy associated with worsening of the kidney function/chemistries-  BUN107 creatinine 7.3 and metabolic acidosis with CO2 11. Patient was started on lasix 2 days prior to presentation by cardiologist. Patient admitted for MS change/lethargy due to Acute on Chronic Kidney injury with Metabolic Acidosis, and poss UTI.  ER physician discussed the case with nephrology no indication for dialysis Gentle hydration with boluses of Ringer lactate, bicarb drips Patient is DNR.Marland Kitchen       Principal Problem:   Acute kidney injury superimposed on chronic kidney disease ([place]) Active Problems:   Hypothyroidism   Essential hypertension   GERD   BREAST CANCER, HX OF   CKD (chronic kidney disease), stage V (HCC)   Chronic systolic CHF (congestive heart failure) (HCC)   Iron deficiency anemia due to chronic blood loss   Diarrhea   Metabolic acidosis   Acute kidney injury (AKI) with acute tubular necrosis (ATN) (HCC)   Pressure injury of skin  Acute on CKD with metabolic Acidosis Due to combination of diarrhea with GI loss of HCO3, poor oral intake and diuretic use Neprology recoomends slw hydration with HCO3, Ringers Monitor renal function with electrolytes, HCO3 Overall improving   Chronic congestive heart failure with systolic dysfunction Compensated On chronic Lasix on hold   Diarrhea Improved, resolving No recent antibiotics On Imodium in the nursing home    DVT prophylaxis: Heparin subcut Code Status: DNR  Family Communication: Son in the room Disposition Plan: Discharge home    Medications  Scheduled Meds: . B-complex with vitamin C  1 tablet Oral Daily  . feeding supplement (NEPRO CARB STEADY)  237 mL Oral Q24H  . heparin  5,000 Units Subcutaneous q12n4p  . sodium chloride flush  3 mL Intravenous Q12H   Continuous Infusions: . sodium chloride    . cefTRIAXone (ROCEPHIN)  IV 2 g (08/01/19 1252)   PRN Meds:.sodium chloride, acetaminophen, sodium chloride flush   Antibiotics   Anti-infectives (From admission, onward)   Start     Dose/Rate Route Frequency Ordered Stop   07/31/19 1400  cefTRIAXone (ROCEPHIN) 2 g in sodium chloride 0.9 % 100 mL IVPB     2 g 200 mL/hr over 30 Minutes Intravenous Every 24 hours 07/31/19 1330          Subjective:   Mary Guzman was seen and examined today.No acute event(s) reported overnight, She is awake and responsive  Objective:   Vitals:   08/01/19 0900 08/01/19 1722 08/01/19 2200 08/02/19 0418  BP: 112/62 113/63 127/69 118/70  Pulse: 62 82 85 80  Resp: 18 18 16 16   Temp: 98 F (36.7  C) 98.3 F (36.8 C) 98.4 F (36.9 C) 98 F (36.7 C)  TempSrc: Oral   Oral  SpO2: 98% 94% 95% 98%  Weight:        Intake/Output Summary (Last 24 hours) at 08/02/2019 0610 Last data filed at 08/02/2019 0418 Gross per 24 hour  Intake 340 ml  Output 350 ml  Net -10 ml     Wt Readings from Last 3 Encounters:  08/01/19 57.6 kg  07/27/19 57.2 kg  07/06/19 60.8 kg     Exam  General: NAD  HEENT: NCAT,  PERRL, MMM  Neck: SUPPLE, (-) JVD  Cardiovascular: RRR, (-) GALLOP, (-) MURMUR  Respiratory: CTA  Gastrointestinal: SOFT, (-) DISTENSION, BS(+), (_) TENDERNESS  Ext: (-) CYANOSIS, (-) EDEMA CNS: awake and responsive Data Reviewed:  I have personally reviewed following labs and imaging studies  Micro Results Recent Results (from the past 240 hour(s))  SARS CORONAVIRUS 2 (TAT 6-24 HRS) Nasopharyngeal Nasopharyngeal Swab     Status:  None   Collection Time: 07/30/19  5:27 PM   Specimen: Nasopharyngeal Swab  Result Value Ref Range Status   SARS Coronavirus 2 NEGATIVE NEGATIVE Final    Comment: (NOTE) SARS-CoV-2 target nucleic acids are NOT DETECTED. The SARS-CoV-2 RNA is generally detectable in upper and lower respiratory specimens during the acute phase of infection. Negative results do not preclude SARS-CoV-2 infection, do not rule out co-infections with other pathogens, and should not be used as the sole basis for treatment or other patient management decisions. Negative results must be combined with clinical observations, patient history, and epidemiological information. The expected result is Negative. Fact Sheet for Patients: SugarRoll.be Fact Sheet for Healthcare Providers: https://www.woods-mathews.com/ This test is not yet approved or cleared by the [place] FDA and  has been authorized for detection and/or diagnosis of SARS-CoV-2 by FDA under an Emergency Use Authorization (EUA). This EUA will remain  in effect (meaning this test can be used) for the duration of the COVID-19 declaration under Section 56 4(b)(1) of the Act, 21 U.S.C. section 360bbb-3(b)(1), unless the authorization is terminated or revoked sooner. Performed at [facility], [place] [address] [place], [place] [address]   Urine culture     Status: Abnormal   Collection Time: 07/31/19  6:47 AM   Specimen: Urine, Random  Result Value Ref Range Status   Specimen Description URINE, RANDOM  Final   Special Requests   Final    NONE Performed at [facility], [place] [address], [place], [place] [address]    Culture MULTIPLE SPECIES PRESENT, SUGGEST RECOLLECTION (A)  Final   Report Status 08/01/2019 FINAL  Final    Radiology Reports US Renal  Result Date: 07/30/2019 CLINICAL DATA:  Renal failure EXAM: RENAL / URINARY TRACT ULTRASOUND COMPLETE COMPARISON:  CT abdomen 05/30/2019  FINDINGS: Right Kidney: Renal measurements: 8.2 by 5.4 by 3.4 cm = volume: 78 mL. Right renal cystic lesions of mildly varying complexity as shown on recent CT, probably complex cysts I without obvious internal blood flow although enhancement characteristics have not been recently assessed on CT. Renal echogenicity within normal limits. No stones or hydronephrosis. No mass or hydronephrosis. No definite calculi. Left Kidney: Renal measurements: 8.9 by 4.9 by 3.8 cm = volume: 86 mL. Left renal cystic lesions including a mid kidney cyst measuring 2.7 by 2.2 cm with some mild complexity internally, but no obvious internal blood flow. No mass or hydronephrosis. Bladder: Appears normal for degree of bladder distention. Other: None. IMPRESSION:  1. Some simple and some complex bilateral renal lesions as shown on recent CT, most likely cysts and without definite internal blood flow. Surveillance imaging may be warranted. 2. No hydronephrosis or urinary tract calculi identified. Electronically Signed   By: Van Clines M.D.   On: 07/30/2019 19:52   Dg Chest Port 1 View  Result Date: 07/30/2019 CLINICAL DATA:  Shortness of breath EXAM: PORTABLE CHEST 1 VIEW COMPARISON:  06/04/2019 FINDINGS: There is a subacute appearing fracture involving the proximal right humerus. This was likely present on the prior study. There is no pneumothorax. There is atelectasis at the left lung base with a small left-sided pleural effusion. There is no definite acute osseous abnormality. There are subacute rib fractures involving the seventh and eighth ribs posteriorly on the right. There is no pneumothorax. IMPRESSION: 1. Low lung volumes. Persistent atelectasis with a small left-sided effusion at the left lung base. Probable trace to small right-sided pleural effusion. 2. Subacute healing right humeral fracture. 3. Subacute right-sided rib fractures involving the seventh and eighth ribs posteriorly. Electronically Signed   By:  Constance Holster M.D.   On: 07/30/2019 17:43    Lab Data:  CBC: Recent Labs  Lab 07/27/19 1500 07/30/19 1719 07/30/19 1821 07/31/19 0337 08/01/19 0514  WBC 10.6* 15.5*  --  12.5* 13.2*  NEUTROABS  --  12.3*  --   --   --   HGB 10.9* 10.1* 10.5* 10.3* 10.4*  HCT 36.7 32.9* 31.0* 32.7* 32.1*  MCV 96.3 91.9  --  88.9 88.2  PLT 422* 360  --  361 025   Basic Metabolic Panel: Recent Labs  Lab 07/27/19 1500 07/30/19 1719 07/30/19 1821 07/31/19 0337 08/01/19 0514  NA 137 136 137 136 138  K 3.5 3.9 3.9 3.7 3.8  CL 111 108 111 107 105  CO2 11* 10*  --  12* 15*  GLUCOSE 99 103* 98 97 96  BUN 72* 95* 107* 97* 98*  CREATININE 5.34* 7.09* 7.30* 7.23* 7.38*  CALCIUM 8.3* 8.0*  --  7.8* 7.5*  MG  --   --   --  2.1 1.9  PHOS  --   --   --  5.8* 5.8*   GFR: Estimated Creatinine Clearance: 3.9 mL/min (A) (by C-G formula based on SCr of 7.38 mg/dL (H)). Liver Function Tests: Recent Labs  Lab 07/27/19 1500 07/31/19 0337 08/01/19 0514  AST 11* 9* 10*  ALT 6 <5 6  ALKPHOS 235* 153* 142*  BILITOT 0.6 0.3 0.3  PROT 6.5 5.2* 5.0*  ALBUMIN 2.6* 2.2* 2.1*   No results for input(s): LIPASE, AMYLASE in the last 168 hours. No results for input(s): AMMONIA in the last 168 hours. Coagulation Profile: No results for input(s): INR, PROTIME in the last 168 hours. Cardiac Enzymes: No results for input(s): CKTOTAL, CKMB, CKMBINDEX, TROPONINI in the last 168 hours. BNP (last 3 results) No results for input(s): PROBNP in the last 8760 hours. HbA1C: No results for input(s): HGBA1C in the last 72 hours. CBG: No results for input(s): GLUCAP in the last 168 hours. Lipid Profile: No results for input(s): CHOL, HDL, LDLCALC, TRIG, CHOLHDL, LDLDIRECT in the last 72 hours. Thyroid Function Tests: No results for input(s): TSH, T4TOTAL, FREET4, T3FREE, THYROIDAB in the last 72 hours. Anemia Panel: No results for input(s): VITAMINB12, FOLATE, FERRITIN, TIBC, IRON, RETICCTPCT in the last 72  hours. Urine analysis:    Component Value Date/Time   COLORURINE YELLOW 07/31/2019 0650   APPEARANCEUR HAZY (A) 07/31/2019 8527  LABSPEC 1.013 07/31/2019 0650   PHURINE 5.0 07/31/2019 0650   GLUCOSEU NEGATIVE 07/31/2019 0650   HGBUR NEGATIVE 07/31/2019 0650   [place] NEGATIVE 07/31/2019 [place] 07/31/2019 0650   PROTEINUR 30 (A) 07/31/2019 0650   NITRITE NEGATIVE 07/31/2019 0650   LEUKOCYTESUR LARGE (A) 07/31/2019 0650     Benito Mccreedy M.D. [facility] 08/01/2019,  Pager: [phone] Between 7am to 7pm - call Pager - [phone]  After 7pm go to [email or web address] - password [email or web address]  Call night coverage person covering after 7pm

## 2019-08-02 NOTE — Progress Notes (Signed)
Goals of care conversation with patient daughter Mary Guzman.    She is the HCPOA.  History of recent events occurring since September including patient  fall, ruptured appendix and admission to [facility] for SNF.    Hospice Medicare benefit discussed at length with [facility].  Conversation repeated with the patient Mary Guzman, she agrees that she would like to pursue hospice care at home.  She is alert and oriented and makes this request without reservation.    Mary Guzman has a son Mary Guzman, who is local.  Mary Guzman and Mary Guzman would like him to be present at the referral meeting with [facility].  I will place a nursing note to allow physical visit to [facility] for the meeting as this will give both Mary Guzman. Mary Guzman and Mary Guzman clarity and assurance moving forward with the care plan.  Will follow tomorrow for assistance with transition.  Kizzie Fantasia, MSN, RN-BC, [facility], [facility] Palliative Clinical Specialist [place]  For immediate needs page via AMION-Palliative Nurse

## 2019-08-02 NOTE — Progress Notes (Signed)
Occupational Therapy Treatment Patient Details Name: Mary Guzman MRN: [id number] DOB: 1921/08/14 Today's Date: 08/02/2019    History of present illness This  85 y.o. female with medical history significant of hypertension congestive heart failure, recent Rt humeral fx 9/20 GERD hypothyroidism with , DNR status, came with a chief complaint of increase of lethargy, not eating and drinking, diarrhea for the last 2 weeks, and worsening of the kidney function.  Dx: AKI superimposed on chronic kidney disease stage V.  She is not a candidate for dialysis; metabolic acidosis    OT comments  Patient supine in bed upon arrival. Patient reports she just had a bed bath and is feeling tired. Patient states "I don't think I can stand today." Patient agreeable to bed level exercises this session.   Follow Up Recommendations  SNF    Equipment Recommendations  None recommended by OT       Precautions / Restrictions Precautions Precautions: Fall Precaution Comments: patient's DTR reports patient can move R shoulder but not to weight bear Restrictions Weight Bearing Restrictions: Yes RUE Weight Bearing: Non weight bearing       Mobility Bed Mobility               General bed mobility comments: did not attempt, patient reports feeling fatigued after bed bath.                           Cognition Arousal/Alertness: Awake/alert Behavior During Therapy: WFL for tasks assessed/performed Overall Cognitive Status: Within Functional Limits for tasks assessed                                 General Comments: WFL for basic info         Exercises Exercises: General Upper Extremity General Exercises - Upper Extremity Shoulder Flexion: AROM;Left;20 reps;Supine Elbow Flexion: 20 reps;Left;AROM;Supine(AROM; Right; 10 reps; supine) Elbow Extension: AROM;20 reps;Supine;Left Digit Composite Flexion: 20 reps;Both;AROM;Supine           Pertinent Vitals/ Pain       Pain  Assessment: 0-10 Pain Score: 9  Pain Location: Rt shoulder  Pain Descriptors / Indicators: Grimacing;Guarding;Aching Pain Intervention(s): Limited activity within patient's tolerance;Monitored during session      Frequency  Min 2X/week        Progress Toward Goals  OT Goals(current goals can now be found in the care plan section)  Progress towards OT goals: Progressing toward goals  Acute Rehab OT Goals Patient Stated Goal: to feel better  OT Goal Formulation: With patient/family Time For Goal Achievement: 08/15/19 ADL Goals Pt Will Perform Grooming: with set-up;sitting Pt Will Perform Upper Body Bathing: with supervision;with set-up;sitting Pt Will Transfer to Toilet: with mod assist;stand pivot transfer;bedside commode Pt Will Perform Toileting - Clothing Manipulation and hygiene: with mod assist;sit to/from stand Pt/caregiver will Perform Home Exercise Program: Increased strength;Left upper extremity;With Supervision;With written HEP provided  Plan Discharge plan remains appropriate       AM-PAC OT "6 Clicks" Daily Activity     Outcome Measure   Help from another person eating meals?: A Lot Help from another person taking care of personal grooming?: A Lot Help from another person toileting, which includes using toliet, bedpan, or urinal?: A Lot Help from another person bathing (including washing, rinsing, drying)?: A Lot Help from another person to put on and taking off regular upper body clothing?: A Lot Help from  another person to put on and taking off regular lower body clothing?: Total 6 Click Score: 11    End of Session    OT Visit Diagnosis: Unsteadiness on feet (R26.81);Pain Pain - Right/Left: Right Pain - part of body: Shoulder   Activity Tolerance Patient tolerated treatment well   Patient Left  In bed, with call light in reach, bed alarmed, family/visitor present           Time: 1123-1140 OT Time Calculation (min): 17 min  Charges: OT General  Charges $OT Visit: 1 Visit OT Treatments $Therapeutic Exercise: 8-22 mins  [place] OT office: [place] 08/02/2019, 1:34 PM

## 2019-08-02 NOTE — Progress Notes (Signed)
[facility]                                                                              Patient Demographics  Mary Guzman, is a 85 y.o. female, DOB - 1921-07-07, KXF:[id number]  Admit date - 07/30/2019   Admitting Physician Assunta Found, MD  Outpatient Primary MD for the patient is Nafziger, Tommi Rumps, NP  Brief narrative: Mary Guzman is a 85 y.o. female with medical history significant of hypertension congestive heart failure GERD hypothyroidism nursing home resident, DNR status, came with a chief complaint of increase of lethargy, not eating and drinking, diarrhea for the last 2 weeks, and worsening of the kidney function.  Patient was evaluated by cardiology 2 days ago and because of the worsening of the kidney function the Lasix was stopped.  Patient supposed to be on Lasix 40 mg twice a day. [id number] creatinine 7.3 (baseline around 3.0 )and metabolic acidosis with CO2 11 upon presentation. Patient admitted for lethargy due to Acute on Chronic Kidney injury with Metabolic Acidosis, and ? UTI.  ER physician discussed the case with nephrology and according to them, patient not a candidate for dialysis and to consult hospice if renal function worsens.     Principal Problem:   Acute kidney injury superimposed on chronic kidney disease ([place]) Active Problems:   Hypothyroidism   Essential hypertension   GERD   BREAST CANCER, HX OF   CKD (chronic kidney disease), stage V (HCC)   Chronic systolic CHF (congestive heart failure) (HCC)   Iron deficiency anemia due to chronic blood loss   Diarrhea   Metabolic acidosis   Acute kidney injury (AKI) with acute tubular necrosis (ATN) (HCC)   Pressure injury of skin  Acute on CKD stage V with metabolic Acidosis: Patient continues to have metabolic acidosis with worsening renal function with current creatinine of 8.30 as well as very poor urine output.  I discussed in length with patient's daughter [name] who was at the bedside  and we decided to consult palliative care since the are interested in hospice.  In the meantime, I will started on bicarb drip.  Chronic congestive heart failure with systolic dysfunction Compensated.  Continue to hold Lasix due to dehydration.  Diarrhea: Still with diarrhea.  Per daughter, patient has diarrhea for last several years but worse since last 2 weeks.  No abdominal pain or fever.  With chronic history, I doubt C. difficile.  Per daughter, she used to take Imodium but she is not sure if she was getting Imodium at nursing home.  She tells me that patient hates [facility] home and she would either prefer to take the patient home or send to a different nursing home with hospice.  I discussed with nurse and we are going to notify social worker.  Will start on Imodium.  ?  UTI: There is no history of urinary symptoms.  UA is not impressive.  Urine culture growing multiple organisms indicating contamination.  She has been on Rocephin 2 g IV daily for some reason and she has received total 3 doses.  I will discontinue that.  DVT prophylaxis: Heparin subcut  Code Status: DNR Family Communication:  Discussed in length with daughter [name] at the bedside.  Answered all the questions to her satisfaction.  Provided counseling and emotional support. Disposition Plan:  To be determined.  Palliative care to see.   Medications  Scheduled Meds: . B-complex with vitamin C  1 tablet Oral Daily  . feeding supplement (NEPRO CARB STEADY)  237 mL Oral Q24H  . heparin  5,000 Units Subcutaneous q12n4p  . sodium chloride flush  3 mL Intravenous Q12H   Continuous Infusions: . sodium chloride    . cefTRIAXone (ROCEPHIN)  IV 2 g (08/01/19 1252)  .  sodium bicarbonate  infusion 1000 mL     PRN Meds:.sodium chloride, acetaminophen, loperamide HCl, sodium chloride flush   Antibiotics   Anti-infectives (From admission, onward)   Start     Dose/Rate Route Frequency Ordered Stop   07/31/19 1400   cefTRIAXone (ROCEPHIN) 2 g in sodium chloride 0.9 % 100 mL IVPB     2 g 200 mL/hr over 30 Minutes Intravenous Every 24 hours 07/31/19 1330        Subjective:   Patient seen and examined.  Daughter [name] at the bedside.  Only complaint she has is diarrhea.  Per daughter, patient has been dealing with diarrhea for last 2 years which waxes and wanes in intensity but it has been worse in the last 2 weeks.  Per her, she usually gets Imodium at her nursing home but she is not sure if she received any Imodium during last 2 weeks.  She tells me that patient was not happy with current nursing home.  Objective:   Vitals:   08/01/19 1722 08/01/19 2200 08/02/19 0418 08/02/19 0900  BP: 113/63 127/69 118/70 118/88  Pulse: 82 85 80 74  Resp: 18 16 16    Temp: 98.3 F (36.8 C) 98.4 F (36.9 C) 98 F (36.7 C) 97.9 F (36.6 C)  TempSrc:   Oral Oral  SpO2: 94% 95% 98% 100%  Weight:        Intake/Output Summary (Last 24 hours) at 08/02/2019 1309 Last data filed at 08/02/2019 0900 Gross per 24 hour  Intake 460 ml  Output 300 ml  Net 160 ml     Wt Readings from Last 3 Encounters:  08/01/19 57.6 kg  07/27/19 57.2 kg  07/06/19 60.8 kg   Physical examination:  General exam: Appears calm and comfortable but cachectic Respiratory system: Clear to auscultation. Respiratory effort normal. Cardiovascular system: S1 & S2 heard, RRR. No JVD, murmurs, rubs, gallops or clicks. No pedal edema. Gastrointestinal system: Abdomen is nondistended, soft and nontender. No organomegaly or masses felt. Normal bowel sounds heard. Central nervous system: Alert and oriented. No focal neurological deficits. Extremities: Symmetric 5 x 5 power. Skin: No rashes, lesions or ulcers.  Psychiatry: Judgement and insight appear normal. Mood & affect appropriate.     CNS: awake and responsive Data Reviewed:  I have personally reviewed following labs and imaging studies  Micro Results Recent Results (from the past  240 hour(s))  SARS CORONAVIRUS 2 (TAT 6-24 HRS) Nasopharyngeal Nasopharyngeal Swab     Status: None   Collection Time: 07/30/19  5:27 PM   Specimen: Nasopharyngeal Swab  Result Value Ref Range Status   SARS Coronavirus 2 NEGATIVE NEGATIVE Final    Comment: (NOTE) SARS-CoV-2 target nucleic acids are NOT DETECTED. The SARS-CoV-2 RNA is generally detectable in upper and lower respiratory specimens during the acute phase of infection. Negative results do not preclude SARS-CoV-2 infection,  do not rule out co-infections with other pathogens, and should not be used as the sole basis for treatment or other patient management decisions. Negative results must be combined with clinical observations, patient history, and epidemiological information. The expected result is Negative. Fact Sheet for Patients: SugarRoll.be Fact Sheet for Healthcare Providers: https://www.woods-mathews.com/ This test is not yet approved or cleared by the [place] FDA and  has been authorized for detection and/or diagnosis of SARS-CoV-2 by FDA under an Emergency Use Authorization (EUA). This EUA will remain  in effect (meaning this test can be used) for the duration of the COVID-19 declaration under Section 56 4(b)(1) of the Act, 21 U.S.C. section 360bbb-3(b)(1), unless the authorization is terminated or revoked sooner. Performed at [facility], [place] [address], [place], [place] [address]   Urine culture     Status: Abnormal   Collection Time: 07/31/19  6:47 AM   Specimen: Urine, Random  Result Value Ref Range Status   Specimen Description URINE, RANDOM  Final   Special Requests   Final    NONE Performed at [facility], [place] [address], [place],  [address]    Culture MULTIPLE SPECIES PRESENT, SUGGEST RECOLLECTION (A)  Final   Report Status 08/01/2019 FINAL  Final    Radiology Reports US Renal  Result Date: 07/30/2019 CLINICAL DATA:  Renal  failure EXAM: RENAL / URINARY TRACT ULTRASOUND COMPLETE COMPARISON:  CT abdomen 05/30/2019 FINDINGS: Right Kidney: Renal measurements: 8.2 by 5.4 by 3.4 cm = volume: 78 mL. Right renal cystic lesions of mildly varying complexity as shown on recent CT, probably complex cysts I without obvious internal blood flow although enhancement characteristics have not been recently assessed on CT. Renal echogenicity within normal limits. No stones or hydronephrosis. No mass or hydronephrosis. No definite calculi. Left Kidney: Renal measurements: 8.9 by 4.9 by 3.8 cm = volume: 86 mL. Left renal cystic lesions including a mid kidney cyst measuring 2.7 by 2.2 cm with some mild complexity internally, but no obvious internal blood flow. No mass or hydronephrosis. Bladder: Appears normal for degree of bladder distention. Other: None. IMPRESSION: 1. Some simple and some complex bilateral renal lesions as shown on recent CT, most likely cysts and without definite internal blood flow. Surveillance imaging may be warranted. 2. No hydronephrosis or urinary tract calculi identified. Electronically Signed   By: Van Clines M.D.   On: 07/30/2019 19:52   Dg Chest Port 1 View  Result Date: 07/30/2019 CLINICAL DATA:  Shortness of breath EXAM: PORTABLE CHEST 1 VIEW COMPARISON:  06/04/2019 FINDINGS: There is a subacute appearing fracture involving the proximal right humerus. This was likely present on the prior study. There is no pneumothorax. There is atelectasis at the left lung base with a small left-sided pleural effusion. There is no definite acute osseous abnormality. There are subacute rib fractures involving the seventh and eighth ribs posteriorly on the right. There is no pneumothorax. IMPRESSION: 1. Low lung volumes. Persistent atelectasis with a small left-sided effusion at the left lung base. Probable trace to small right-sided pleural effusion. 2. Subacute healing right humeral fracture. 3. Subacute right-sided rib  fractures involving the seventh and eighth ribs posteriorly. Electronically Signed   By: Constance Holster M.D.   On: 07/30/2019 17:43    Lab Data:  CBC: Recent Labs  Lab 07/27/19 1500 07/30/19 1719 07/30/19 1821 07/31/19 0337 08/01/19 0514 08/02/19 0518  WBC 10.6* 15.5*  --  12.5* 13.2* 12.6*  NEUTROABS  --  12.3*  --   --   --   --  HGB 10.9* 10.1* 10.5* 10.3* 10.4* 10.5*  HCT 36.7 32.9* 31.0* 32.7* 32.1* 32.5*  MCV 96.3 91.9  --  88.9 88.2 86.7  PLT 422* 360  --  361 349 814   Basic Metabolic Panel: Recent Labs  Lab 07/27/19 1500 07/30/19 1719 07/30/19 1821 07/31/19 0337 08/01/19 0514 08/02/19 0518  NA 137 136 137 136 138 139  K 3.5 3.9 3.9 3.7 3.8 3.6  CL 111 108 111 107 105 103  CO2 11* 10*  --  12* 15* 16*  GLUCOSE 99 103* 98 97 96 87  BUN 72* 95* 107* 97* 98* 101*  CREATININE 5.34* 7.09* 7.30* 7.23* 7.38* 8.16*  CALCIUM 8.3* 8.0*  --  7.8* 7.5* 7.4*  MG  --   --   --  2.1 1.9 1.9  PHOS  --   --   --  5.8* 5.8* 6.1*   GFR: Estimated Creatinine Clearance: 3.5 mL/min (A) (by C-G formula based on SCr of 8.16 mg/dL (H)). Liver Function Tests: Recent Labs  Lab 07/27/19 1500 07/31/19 0337 08/01/19 0514 08/02/19 0518  AST 11* 9* 10* 12*  ALT 6 5 6 6   ALKPHOS 235* 153* 142* 140*  BILITOT 0.6 0.3 0.3 0.5  PROT 6.5 5.2* 5.0* 4.9*  ALBUMIN 2.6* 2.2* 2.1* 2.0*   No results for input(s): LIPASE, AMYLASE in the last 168 hours. No results for input(s): AMMONIA in the last 168 hours. Coagulation Profile: No results for input(s): INR, PROTIME in the last 168 hours. Cardiac Enzymes: No results for input(s): CKTOTAL, CKMB, CKMBINDEX, TROPONINI in the last 168 hours. BNP (last 3 results) No results for input(s): PROBNP in the last 8760 hours. HbA1C: No results for input(s): HGBA1C in the last 72 hours. CBG: No results for input(s): GLUCAP in the last 168 hours. Lipid Profile: No results for input(s): CHOL, HDL, LDLCALC, TRIG, CHOLHDL, LDLDIRECT in the last  72 hours. Thyroid Function Tests: No results for input(s): TSH, T4TOTAL, FREET4, T3FREE, THYROIDAB in the last 72 hours. Anemia Panel: No results for input(s): VITAMINB12, FOLATE, FERRITIN, TIBC, IRON, RETICCTPCT in the last 72 hours. Urine analysis:    Component Value Date/Time   COLORURINE YELLOW 07/31/2019 0650   APPEARANCEUR HAZY (A) 07/31/2019 0650   LABSPEC 1.013 07/31/2019 0650   PHURINE 5.0 07/31/2019 0650   GLUCOSEU NEGATIVE 07/31/2019 0650   HGBUR NEGATIVE 07/31/2019 0650   BILIRUBINUR NEGATIVE 07/31/2019 [place] 07/31/2019 0650   PROTEINUR 30 (A) 07/31/2019 0650   NITRITE NEGATIVE 07/31/2019 0650   LEUKOCYTESUR LARGE (A) 07/31/2019 0650   Time spent 30 minutes  Darliss Cheney M.D.  [facility] 08/02/2019,  go to [email or web address] - password [email or web address] to page the provider.  Call night coverage person covering from 7 PM to 7 AM

## 2019-08-02 NOTE — Care Management Important Message (Signed)
Important Message  Patient Details  Name: Mary Guzman MRN: [id number] Date of Birth: 10-26-1920   Medicare Important Message Given:  Yes     [name] 08/02/2019, 3:12 PM

## 2019-08-03 LAB — COMPREHENSIVE METABOLIC PANEL
ALT: 6 U/L (ref 0–44)
AST: 11 U/L — ABNORMAL LOW (ref 15–41)
Albumin: 1.9 g/dL — ABNORMAL LOW (ref 3.5–5.0)
Alkaline Phosphatase: 126 U/L (ref 38–126)
Anion gap: 17 — ABNORMAL HIGH (ref 5–15)
BUN: 100 mg/dL — ABNORMAL HIGH (ref 8–23)
CO2: 18 mmol/L — ABNORMAL LOW (ref 22–32)
Calcium: 7.4 mg/dL — ABNORMAL LOW (ref 8.9–10.3)
Chloride: 105 mmol/L (ref 98–111)
Creatinine, Ser: 8.26 mg/dL — ABNORMAL HIGH (ref 0.44–1.00)
GFR calc Af Amer: 4 mL/min — ABNORMAL LOW (ref >60)
GFR calc non Af Amer: 4 mL/min — ABNORMAL LOW (ref >60)
Glucose, Bld: 99 mg/dL (ref 70–99)
Potassium: 3.5 mmol/L (ref 3.5–5.1)
Sodium: 140 mmol/L (ref 135–145)
Total Bilirubin: 0.5 mg/dL (ref 0.3–1.2)
Total Protein: 4.7 g/dL — ABNORMAL LOW (ref 6.5–8.1)

## 2019-08-03 LAB — CBC WITH DIFFERENTIAL/PLATELET
Abs Immature Granulocytes: 0.07 10*3/uL (ref 0.00–0.07)
Basophils Absolute: 0 10*3/uL (ref 0.0–0.1)
Basophils Relative: 0 %
Eosinophils Absolute: 0.4 10*3/uL (ref 0.0–0.5)
Eosinophils Relative: 4 %
HCT: 29.7 % — ABNORMAL LOW (ref 36.0–46.0)
Hemoglobin: 9.6 g/dL — ABNORMAL LOW (ref 12.0–15.0)
Immature Granulocytes: 1 %
Lymphocytes Relative: 13 %
Lymphs Abs: 1.5 10*3/uL (ref 0.7–4.0)
MCH: 28.3 pg (ref 26.0–34.0)
MCHC: 32.3 g/dL (ref 30.0–36.0)
MCV: 87.6 fL (ref 80.0–100.0)
Monocytes Absolute: 0.6 10*3/uL (ref 0.1–1.0)
Monocytes Relative: 6 %
Neutro Abs: 8.8 10*3/uL — ABNORMAL HIGH (ref 1.7–7.7)
Neutrophils Relative %: 76 %
Platelets: 338 10*3/uL (ref 150–400)
RBC: 3.39 MIL/uL — ABNORMAL LOW (ref 3.87–5.11)
RDW: 16.6 % — ABNORMAL HIGH (ref 11.5–15.5)
WBC: 11.4 10*3/uL — ABNORMAL HIGH (ref 4.0–10.5)
nRBC: 0 % (ref 0.0–0.2)

## 2019-08-03 LAB — MAGNESIUM: Magnesium: 1.9 mg/dL (ref 1.7–2.4)

## 2019-08-03 NOTE — Plan of Care (Signed)
  Problem: Clinical Measurements: Goal: Ability to maintain clinical measurements within normal limits will improve Outcome: Progressing   

## 2019-08-03 NOTE — Progress Notes (Signed)
H[facility]rologist [facility])  Hospital Liaison: RN note    Notified by [facility] [place], of patient/family request for [facility] services at home after discharge. Chart and patient information under review by [facility] physician. Hospice eligibility pending currently.     Writer spoke with daughter, [name]  to initiate education related to hospice philosophy, services and team approach to care.           verbalized understanding of information given. Per discussion, plan is for discharge to home by PTAR.     Please send signed and completed DNR form home with patient/family. Patient will need prescriptions for discharge comfort medications.     DME needs have been discussed, patient currently has the following equipment in the home: none.  Patient/family requests the following DME for delivery to the home: hospital bed, 3N1 walker, W/C . [place] equipment manager has been notified and will contact [employer] to arrange delivery to the home. Home address has been verified and is correct in the chart. [name]  is the family member to contact to arrange time of delivery.     [facility]erral [facility] aware of the above. Please notify [facility] when patient is ready to leave the unit at discharge. (Call [phone] or [phone] after 5pm.) [facility] information and contact numbers given to [facility].      Please call with any hospice related questions.     Thank you for this referral.     Farrel Gordon, RN, CCM  Walker (listed on AMION under Hospice and [place] [facility] [place])  [phone]

## 2019-08-03 NOTE — Progress Notes (Signed)
Physical Therapy Treatment Patient Details Name: Mary Guzman MRN: [id number] DOB: January 11, 1921 Today's Date: 08/03/2019    History of Present Illness This  85 y.o. female with medical history significant of hypertension congestive heart failure, recent Rt humeral fx 9/20 GERD hypothyroidism with , DNR status, came with a chief complaint of increase of lethargy, not eating and drinking, diarrhea for the last 2 weeks, and worsening of the kidney function.  Dx: AKI superimposed on chronic kidney disease stage V.  She is not a candidate for dialysis; metabolic acidosis     PT Comments    Patient received in bed, initially only agreeable to bed level exercises then changed her mind and asked to sit on the side of the bed. Daughter present and supportive, assisted in session. She required ModAx2 for supine to sit, once up could maintain sitting balance with min guard and participated in LAQs, ankle pumps, marches, and bicep curls at EOB. ModAx2 to return to supine, MaxAx2 to reposition in bed. She was left positioned to comfort with all needs met, bed alarm active and daughter present.     Follow Up Recommendations  Other (comment)(now going home with hospice- would recommend HHPT if hospice is agreeable)     Equipment Recommendations  Rolling walker with 5" wheels;3in1 (PT);Wheelchair (measurements PT);Wheelchair cushion (measurements PT);Hospital bed    Recommendations for Other Services       Precautions / Restrictions Precautions Precautions: Fall Precaution Comments: patient's DTR reports patient can move R shoulder but not to weight bear Restrictions Weight Bearing Restrictions: Yes RUE Weight Bearing: Non weight bearing    Mobility  Bed Mobility Overal bed mobility: Needs Assistance Bed Mobility: Supine to Sit;Sit to Supine     Supine to sit: Mod assist;+2 for physical assistance Sit to supine: Mod assist;+2 for physical assistance   General bed mobility comments: daughter  assisted, ModA to scoot hips around at EOB and initially get balance when sitting  Transfers Overall transfer level: Needs assistance               General transfer comment: refused  Ambulation/Gait             General Gait Details: refused   Stairs             Wheelchair Mobility    Modified Rankin (Stroke Patients Only)       Balance Overall balance assessment: Needs assistance Sitting-balance support: Feet unsupported Sitting balance-Leahy Scale: Fair Sitting balance - Comments: min guard without feet supported Postural control: Posterior lean                                  Cognition Arousal/Alertness: Awake/alert Behavior During Therapy: WFL for tasks assessed/performed Overall Cognitive Status: Within Functional Limits for tasks assessed                                 General Comments: WFL for basic info       Exercises      General Comments General comments (skin integrity, edema, etc.): daughter present and supportive, assisted in session; pt performed LAQs, marches, ankle dorsiflexion, and bicep curls at EOB      Pertinent Vitals/Pain Pain Assessment: Faces Faces Pain Scale: Hurts little more Pain Location: Rt shoulder  Pain Descriptors / Indicators: Grimacing;Guarding;Aching Pain Intervention(s): Limited activity within patient's tolerance;Monitored during session  Home Living                      Prior Function            PT Goals (current goals can now be found in the care plan section) Acute Rehab PT Goals Patient Stated Goal: to feel better  PT Goal Formulation: With patient Time For Goal Achievement: 08/15/19 Potential to Achieve Goals: Good Progress towards PT goals: Progressing toward goals    Frequency    Min 2X/week      PT Plan Discharge plan needs to be updated    Co-evaluation              AM-PAC PT "6 Clicks" Mobility   Outcome Measure  Help needed  turning from your back to your side while in a flat bed without using bedrails?: A Little Help needed moving from lying on your back to sitting on the side of a flat bed without using bedrails?: A Lot Help needed moving to and from a bed to a chair (including a wheelchair)?: A Lot Help needed standing up from a chair using your arms (e.g., wheelchair or bedside chair)?: A Lot Help needed to walk in hospital room?: A Lot Help needed climbing 3-5 steps with a railing? : Total 6 Click Score: 12    End of Session   Activity Tolerance: Patient tolerated treatment well Patient left: in bed;with call bell/phone within reach;with family/visitor present;with bed alarm set   PT Visit Diagnosis: Unsteadiness on feet (R26.81);Other abnormalities of gait and mobility (R26.89);History of falling (Z91.81)     Time: 8867-7373 PT Time Calculation (min) (ACUTE ONLY): 15 min  Charges:  $Therapeutic Activity: 8-22 mins                     Windell Norfolk, DPT, PN1   Supplemental Physical Therapist [place]    Pager [phone] Acute Rehab Office [phone]

## 2019-08-03 NOTE — Progress Notes (Signed)
[facility]                                                                              Patient Demographics  Mary Guzman, is a 85 y.o. female, DOB - 11-29-1920, YNW:[id number]  Admit date - 07/30/2019   Admitting Physician Assunta Found, MD  Outpatient Primary MD for the patient is Nafziger, Tommi Rumps, NP  Brief narrative: Mary Guzman is a 85 y.o. female with medical history significant of hypertension congestive heart failure GERD hypothyroidism nursing home resident, DNR status, came with a chief complaint of increase of lethargy, not eating and drinking, diarrhea for the last 2 weeks, and worsening of the kidney function.  Patient was evaluated by cardiology 2 days ago and because of the worsening of the kidney function the Lasix was stopped.  Patient supposed to be on Lasix 40 mg twice a day. [id number] creatinine 7.3 (baseline around 3.0 )and metabolic acidosis with CO2 11 upon presentation. Patient admitted for lethargy due to Acute on Chronic Kidney injury with Metabolic Acidosis, and ? UTI.  ER physician discussed the case with nephrology and according to them, patient not a candidate for dialysis and to consult hospice if renal function worsens.  After discussion with Mary Guzman, patient's daughter and patient herself, palliative care and subsequently hospice was consulted.  Pending delivery of DME equipment, patient will be discharged home, likely tomorrow.   Principal Problem:   Acute kidney injury superimposed on chronic kidney disease ([place]) Active Problems:   Hypothyroidism   Essential hypertension   GERD   BREAST CANCER, HX OF   CKD (chronic kidney disease), stage V (HCC)   Chronic systolic CHF (congestive heart failure) (HCC)   Iron deficiency anemia due to chronic blood loss   Diarrhea   Metabolic acidosis   Acute kidney injury (AKI) with acute tubular necrosis (ATN) (HCC)   Pressure injury of skin  Acute on CKD stage V with metabolic Acidosis: Metabolic  acidosis is improving.  In last 24 hours, patient has had good urine output and her renal function has also improved but it is still too far out from her baseline.  We will continue bicarb drip.  Repeat labs in the morning.   Chronic congestive heart failure with systolic dysfunction Compensated.  Continue to hold Lasix due to dehydration.  Diarrhea: Still with diarrhea but slightly improved.  Continue Imodium.  ?  UTI: There is no history of urinary symptoms.  UA is not impressive.  Urine culture growing multiple organisms indicating contamination.  She received few doses of Rocephin which were discontinued yesterday.  DVT prophylaxis: Heparin subcut Code Status: DNR Family Communication:  No family present at the bedside today.  I had a long discussion with patient's daughter Mary Guzman yesterday. Disposition Plan:  Patient seen by hospice.  They have ordered DME equipment which will be delivered to patient's home tomorrow.  Patient will then be discharged home with hospice tomorrow.   Medications  Scheduled Meds: . B-complex with vitamin C  1 tablet Oral Daily  . feeding supplement (NEPRO CARB STEADY)  237 mL Oral Q24H  . heparin  5,000 Units Subcutaneous q12n4p  .  sodium chloride flush  10-40 mL Intracatheter Q12H  . sodium chloride flush  3 mL Intravenous Q12H   Continuous Infusions: . sodium chloride    .  sodium bicarbonate  infusion 1000 mL 75 mL/hr at 08/03/19 0505   PRN Meds:.sodium chloride, acetaminophen, loperamide HCl, sodium chloride flush, sodium chloride flush   Antibiotics   Anti-infectives (From admission, onward)   Start     Dose/Rate Route Frequency Ordered Stop   07/31/19 1400  cefTRIAXone (ROCEPHIN) 2 g in sodium chloride 0.9 % 100 mL IVPB  Status:  Discontinued     2 g 200 mL/hr over 30 Minutes Intravenous Every 24 hours 07/31/19 1330 08/02/19 1350      Subjective:   Patient seen and examined.  Daughter was not present today.  Patient stated that she is  overall feeling a little better than yesterday.  Diarrhea a little bit improved as well.  Objective:   Vitals:   08/02/19 0900 08/02/19 1500 08/02/19 2053 08/03/19 0900  BP: 118/88 134/85 130/80 (!) 133/59  Pulse: 74 74 70 70  Resp:   18 18  Temp: 97.9 F (36.6 C) 98.2 F (36.8 C) 98.4 F (36.9 C) (!) 97.5 F (36.4 C)  TempSrc: Oral Oral Oral Oral  SpO2: 100% 100% 100% 96%  Weight:        Intake/Output Summary (Last 24 hours) at 08/03/2019 0947 Last data filed at 08/03/2019 0442 Gross per 24 hour  Intake 1354.59 ml  Output -  Net 1354.59 ml     Wt Readings from Last 3 Encounters:  08/01/19 57.6 kg  07/27/19 57.2 kg  07/06/19 60.8 kg   Physical examination:  General exam: Appears calm and comfortable but cachectic Respiratory system: Clear to auscultation. Respiratory effort normal. Cardiovascular system: S1 & S2 heard, RRR. No JVD, murmurs, rubs, gallops or clicks. No pedal edema. Gastrointestinal system: Abdomen is nondistended, soft and nontender. No organomegaly or masses felt. Normal bowel sounds heard. Central nervous system: Alert and oriented. No focal neurological deficits. Extremities: Symmetric 5 x 5 power. Skin: No rashes, lesions or ulcers.  Psychiatry: Judgement and insight appear normal. Mood & affect appropriate.   CNS: awake and responsive Data Reviewed:  I have personally reviewed following labs and imaging studies  Micro Results Recent Results (from the past 240 hour(s))  SARS CORONAVIRUS 2 (TAT 6-24 HRS) Nasopharyngeal Nasopharyngeal Swab     Status: None   Collection Time: 07/30/19  5:27 PM   Specimen: Nasopharyngeal Swab  Result Value Ref Range Status   SARS Coronavirus 2 NEGATIVE NEGATIVE Final    Comment: (NOTE) SARS-CoV-2 target nucleic acids are NOT DETECTED. The SARS-CoV-2 RNA is generally detectable in upper and lower respiratory specimens during the acute phase of infection. Negative results do not preclude SARS-CoV-2 infection, do  not rule out co-infections with other pathogens, and should not be used as the sole basis for treatment or other patient management decisions. Negative results must be combined with clinical observations, patient history, and epidemiological information. The expected result is Negative. Fact Sheet for Patients: SugarRoll.be Fact Sheet for Healthcare Providers: https://www.woods-mathews.com/ This test is not yet approved or cleared by the [place] FDA and  has been authorized for detection and/or diagnosis of SARS-CoV-2 by FDA under an Emergency Use Authorization (EUA). This EUA will remain  in effect (meaning this test can be used) for the duration of the COVID-19 declaration under Section 56 4(b)(1) of the Act, 21 U.S.C. section 360bbb-3(b)(1), unless the authorization is terminated or  revoked sooner. Performed at [facility], [place] [address], [place], [place] [address]   Urine culture     Status: Abnormal   Collection Time: 07/31/19  6:47 AM   Specimen: Urine, Random  Result Value Ref Range Status   Specimen Description URINE, RANDOM  Final   Special Requests   Final    NONE Performed at [facility], [place] [address], [place], [place] [address]    Culture MULTIPLE SPECIES PRESENT, SUGGEST RECOLLECTION (A)  Final   Report Status 08/01/2019 FINAL  Final    Radiology Reports US Renal  Result Date: 07/30/2019 CLINICAL DATA:  Renal failure EXAM: RENAL / URINARY TRACT ULTRASOUND COMPLETE COMPARISON:  CT abdomen 05/30/2019 FINDINGS: Right Kidney: Renal measurements: 8.2 by 5.4 by 3.4 cm = volume: 78 mL. Right renal cystic lesions of mildly varying complexity as shown on recent CT, probably complex cysts I without obvious internal blood flow although enhancement characteristics have not been recently assessed on CT. Renal echogenicity within normal limits. No stones or hydronephrosis. No mass or hydronephrosis. No definite  calculi. Left Kidney: Renal measurements: 8.9 by 4.9 by 3.8 cm = volume: 86 mL. Left renal cystic lesions including a mid kidney cyst measuring 2.7 by 2.2 cm with some mild complexity internally, but no obvious internal blood flow. No mass or hydronephrosis. Bladder: Appears normal for degree of bladder distention. Other: None. IMPRESSION: 1. Some simple and some complex bilateral renal lesions as shown on recent CT, most likely cysts and without definite internal blood flow. Surveillance imaging may be warranted. 2. No hydronephrosis or urinary tract calculi identified. Electronically Signed   By: Van Clines M.D.   On: 07/30/2019 19:52   Dg Chest Port 1 View  Result Date: 07/30/2019 CLINICAL DATA:  Shortness of breath EXAM: PORTABLE CHEST 1 VIEW COMPARISON:  06/04/2019 FINDINGS: There is a subacute appearing fracture involving the proximal right humerus. This was likely present on the prior study. There is no pneumothorax. There is atelectasis at the left lung base with a small left-sided pleural effusion. There is no definite acute osseous abnormality. There are subacute rib fractures involving the seventh and eighth ribs posteriorly on the right. There is no pneumothorax. IMPRESSION: 1. Low lung volumes. Persistent atelectasis with a small left-sided effusion at the left lung base. Probable trace to small right-sided pleural effusion. 2. Subacute healing right humeral fracture. 3. Subacute right-sided rib fractures involving the seventh and eighth ribs posteriorly. Electronically Signed   By: Constance Holster M.D.   On: 07/30/2019 17:43    Lab Data:  CBC: Recent Labs  Lab 07/30/19 1719 07/30/19 1821 07/31/19 0337 08/01/19 0514 08/02/19 0518 08/03/19 0339  WBC 15.5*  --  12.5* 13.2* 12.6* 11.4*  NEUTROABS 12.3*  --   --   --   --  8.8*  HGB 10.1* 10.5* 10.3* 10.4* 10.5* 9.6*  HCT 32.9* 31.0* 32.7* 32.1* 32.5* 29.7*  MCV 91.9  --  88.9 88.2 86.7 87.6  PLT 360  --  361 349 343 330    Basic Metabolic Panel: Recent Labs  Lab 07/31/19 0337 08/01/19 0514 08/02/19 0518 08/02/19 1214 08/03/19 0339  NA 136 138 139 138 140  K 3.7 3.8 3.6 3.9 3.5  CL 107 105 103 100 105  CO2 12* 15* 16* 17* 18*  GLUCOSE 97 96 87 98 99  BUN 97* 98* 101* 103* 100*  CREATININE 7.23* 7.38* 8.16* 8.30* 8.26*  CALCIUM 7.8* 7.5* 7.4* 7.6* 7.4*  MG  2.1 1.9 1.9  --  1.9  PHOS 5.8* 5.8* 6.1*  --   --    GFR: Estimated Creatinine Clearance: 3.4 mL/min (A) (by C-G formula based on SCr of 8.26 mg/dL (H)). Liver Function Tests: Recent Labs  Lab 07/31/19 0337 08/01/19 0514 08/02/19 0518 08/02/19 1214 08/03/19 0339  AST 9* 10* 12* 14* 11*  ALT 5 6 6 6 6   ALKPHOS 153* 142* 140* 156* 126  BILITOT 0.3 0.3 0.5 0.4 0.5  PROT 5.2* 5.0* 4.9* 5.6* 4.7*  ALBUMIN 2.2* 2.1* 2.0* 2.2* 1.9*   No results for input(s): LIPASE, AMYLASE in the last 168 hours. No results for input(s): AMMONIA in the last 168 hours. Coagulation Profile: No results for input(s): INR, PROTIME in the last 168 hours. Cardiac Enzymes: No results for input(s): CKTOTAL, CKMB, CKMBINDEX, TROPONINI in the last 168 hours. BNP (last 3 results) No results for input(s): PROBNP in the last 8760 hours. HbA1C: No results for input(s): HGBA1C in the last 72 hours. CBG: No results for input(s): GLUCAP in the last 168 hours. Lipid Profile: No results for input(s): CHOL, HDL, LDLCALC, TRIG, CHOLHDL, LDLDIRECT in the last 72 hours. Thyroid Function Tests: No results for input(s): TSH, T4TOTAL, FREET4, T3FREE, THYROIDAB in the last 72 hours. Anemia Panel: No results for input(s): VITAMINB12, FOLATE, FERRITIN, TIBC, IRON, RETICCTPCT in the last 72 hours. Urine analysis:    Component Value Date/Time   COLORURINE YELLOW 07/31/2019 0650   APPEARANCEUR HAZY (A) 07/31/2019 0650   LABSPEC 1.013 07/31/2019 0650   PHURINE 5.0 07/31/2019 0650   GLUCOSEU NEGATIVE 07/31/2019 0650   HGBUR NEGATIVE 07/31/2019 0650   BILIRUBINUR NEGATIVE  07/31/2019 [place] 07/31/2019 0650   PROTEINUR 30 (A) 07/31/2019 0650   NITRITE NEGATIVE 07/31/2019 0650   LEUKOCYTESUR LARGE (A) 07/31/2019 0650   Time spent 28 minutes  Darliss Cheney M.D.  [facility] 08/02/2019,  go to [email or web address] - password [email or web address] to page the provider.  Call night coverage person covering from 7 PM to 7 AM

## 2019-08-03 NOTE — TOC Progression Note (Signed)
Transition of Care [facility]) - Progression Note    Patient Details  Name: Mary Guzman MRN: [id number] Date of Birth: May 04, 1921  Transition of [facility]) CM/SW Contact  Rae Mar, RN Phone Number: 08/03/2019, 2:33 PM  Clinical Narrative:     CM contacted Audrea Muscat with [facility] around [phone] today to advise of referral for services.    CM spoke with daughter around 1400 to find out if she was aware of a DME delivery timeframe yet.  She was not.  CM requested that when she is told by [employer] to please let the primary RN know.  CM then requested the primary RN notify the provider so he can prepare D/C papers and TOC team to be able to schedule transportation via ambulance.  Updated all parties.  Expected Discharge Plan: Home w Hospice Care Barriers to Discharge: Continued Medical Work up  Expected Discharge Plan and Services Expected Discharge Plan: [place] Determinants of Health (SDOH) Interventions    Readmission Risk Interventions No flowsheet data found.

## 2019-08-04 LAB — COMPREHENSIVE METABOLIC PANEL
ALT: 6 U/L (ref 0–44)
AST: 11 U/L — ABNORMAL LOW (ref 15–41)
Albumin: 1.7 g/dL — ABNORMAL LOW (ref 3.5–5.0)
Alkaline Phosphatase: 113 U/L (ref 38–126)
Anion gap: 18 — ABNORMAL HIGH (ref 5–15)
BUN: 96 mg/dL — ABNORMAL HIGH (ref 8–23)
CO2: 21 mmol/L — ABNORMAL LOW (ref 22–32)
Calcium: 7 mg/dL — ABNORMAL LOW (ref 8.9–10.3)
Chloride: 99 mmol/L (ref 98–111)
Creatinine, Ser: 8.46 mg/dL — ABNORMAL HIGH (ref 0.44–1.00)
GFR calc Af Amer: 4 mL/min — ABNORMAL LOW (ref >60)
GFR calc non Af Amer: 4 mL/min — ABNORMAL LOW (ref >60)
Glucose, Bld: 128 mg/dL — ABNORMAL HIGH (ref 70–99)
Potassium: 3.2 mmol/L — ABNORMAL LOW (ref 3.5–5.1)
Sodium: 138 mmol/L (ref 135–145)
Total Bilirubin: 0.6 mg/dL (ref 0.3–1.2)
Total Protein: 4.5 g/dL — ABNORMAL LOW (ref 6.5–8.1)

## 2019-08-04 LAB — CBC WITH DIFFERENTIAL/PLATELET
Abs Immature Granulocytes: 0.1 10*3/uL — ABNORMAL HIGH (ref 0.00–0.07)
Basophils Absolute: 0 10*3/uL (ref 0.0–0.1)
Basophils Relative: 0 %
Eosinophils Absolute: 0.5 10*3/uL (ref 0.0–0.5)
Eosinophils Relative: 6 %
HCT: 27.6 % — ABNORMAL LOW (ref 36.0–46.0)
Hemoglobin: 9 g/dL — ABNORMAL LOW (ref 12.0–15.0)
Immature Granulocytes: 1 %
Lymphocytes Relative: 14 %
Lymphs Abs: 1.2 10*3/uL (ref 0.7–4.0)
MCH: 28 pg (ref 26.0–34.0)
MCHC: 32.6 g/dL (ref 30.0–36.0)
MCV: 86 fL (ref 80.0–100.0)
Monocytes Absolute: 0.5 10*3/uL (ref 0.1–1.0)
Monocytes Relative: 6 %
Neutro Abs: 6.2 10*3/uL (ref 1.7–7.7)
Neutrophils Relative %: 73 %
Platelets: 285 10*3/uL (ref 150–400)
RBC: 3.21 MIL/uL — ABNORMAL LOW (ref 3.87–5.11)
RDW: 16.5 % — ABNORMAL HIGH (ref 11.5–15.5)
WBC: 8.5 10*3/uL (ref 4.0–10.5)
nRBC: 0 % (ref 0.0–0.2)

## 2019-08-04 MED ORDER — POTASSIUM CHLORIDE CRYS ER 20 MEQ PO TBCR
40.0000 meq | EXTENDED_RELEASE_TABLET | Freq: Once | ORAL | Status: AC
  Administered 2019-08-04: 40 meq via ORAL
  Filled 2019-08-04: qty 2

## 2019-08-04 NOTE — Progress Notes (Addendum)
Manufacturing engineer [facility]) Hospital Liaison Note:  Spoke to patient daughter [name]) who confirmed that DME (Bed) is scheduled to be delivered by noon today to patient home address and an [facility] RN visit is scheduled at 5pm this afternoon. Family is looking forward to having patient discharged today.  Please call with any hospice related questions,  Gar Ponto, RN [facility] Liaison [phone]  UPDATE AT 1406:  [facility] has confirmed that bed has been delivered to patient home address. Patient ready for discharge when orders obtained and transportation is arranged by [facility]. TOC made aware.

## 2019-08-04 NOTE — Discharge Summary (Signed)
Physician Discharge Summary  Mary Guzman WCB:[id number] DOB: 1920/10/09 DOA: 07/30/2019  PCP: [name], NP  Admit date: 07/30/2019 Discharge date: 08/04/2019  Admitted From: Home Disposition:  Home with home hospice   Discharge Condition: Stable CODE STATUS: DNR  Diet recommendation: Dysphagia 3 diet   Brief/Interim Summary: From H&P: Mary L Sellsis a 85 y.o.femalewith medical history significant ofhypertension, congestive heart failure, GERD, hypothyroidism, nursing home resident, DNR status, came with a chief complaint of increase of lethargy, not eating and drinking, diarrhea for the last 2 weeks, and worsening of the kidney function. Patient was evaluated by cardiology 2 days ago and because of the worsening of the kidney function the Lasix was stopped. Patient supposed to be on Lasix 40 mg twice a day. BUN 107 creatinine 7.3 (baseline around 3.0) and metabolic acidosis with CO2 11 upon presentation. Patient admitted for lethargy due to AKI on Chronic Kidney injury with metabolic acidosis, and ? UTI.  ER physician discussed the case with nephrology and according to them, patient not a candidate for dialysis and to consult hospice if renal function worsens.  After discussion, hospice was consulted and patient discharged home with home hospice.   Discharge Diagnoses:  Principal Problem:   Acute kidney injury superimposed on chronic kidney disease ([place]) Active Problems:   Hypothyroidism   Essential hypertension   GERD   BREAST CANCER, HX OF   CKD (chronic kidney disease), stage V (HCC)   Chronic systolic CHF (congestive heart failure) (HCC)   Iron deficiency anemia due to chronic blood loss   Diarrhea   Metabolic acidosis   Acute kidney injury (AKI) with acute tubular necrosis (ATN) (HCC)   Pressure injury of skin   Acute on CKD stage V with metabolic acidosis: Per Nephrology, patient not candidate for dialysis. Cr has remained stable in the 8 range. Acidosis  improved.   Chronic congestive heart failure with systolic dysfunction: Compensated.  Continue to hold Lasix due to dehydration.  Diarrhea: Continue Imodium.  Pyuria: There is no history of urinary symptoms.  UA is not impressive.  Urine culture growing multiple organisms indicating contamination.  She received few doses of Rocephin which were discontinued.  Hypokalemia: Replace    In agreement with assessment of the pressure ulcer as below:  Pressure Injury 07/31/19 Sacrum Stage I -  Intact skin with non-blanchable redness of a localized area usually over a bony prominence. (Active)  07/31/19 0246  Location: Sacrum  Location Orientation:   Staging: Stage I -  Intact skin with non-blanchable redness of a localized area usually over a bony prominence.  Wound Description (Comments):   Present on Admission: Yes    Discharge Instructions  Discharge Instructions    Increase activity slowly   Complete by: As directed      Allergies as of 08/04/2019   No Known Allergies     Medication List    STOP taking these medications   aspirin EC 81 MG tablet   cholestyramine 4 g packet Commonly known as: QUESTRAN   levothyroxine 100 MCG tablet Commonly known as: SYNTHROID   metoprolol succinate 25 MG 24 hr tablet Commonly known as: TOPROL-XL   traMADol-acetaminophen 37.5-325 MG tablet Commonly known as: Ultracet     TAKE these medications   acetaminophen 500 MG tablet Commonly known as: TYLENOL Take 500 mg by mouth every 6 (six) hours as needed for mild pain.   carboxymethylcellulose 0.5 % Soln Commonly known as: REFRESH PLUS Place 1 drop into both eyes daily as  needed (dry eye).   Dermacloud Crea Apply 1 application topically daily as needed (for Buttocks rash).   diphenoxylate-atropine 2.5-0.025 MG/5ML liquid Commonly known as: LOMOTIL Take 10 mLs by mouth 4 (four) times daily as needed for diarrhea or loose stools.   famotidine-calcium carbonate-magnesium  hydroxide 10-800-165 MG chewable tablet Commonly known as: PEPCID COMPLETE Chew 1 tablet by mouth daily as needed.   Imodium A-D 2 MG tablet Generic drug: loperamide Take 2 mg by mouth 4 (four) times daily as needed for diarrhea or loose stools.   pantoprazole 20 MG tablet Commonly known as: Protonix Take 1 tablet (20 mg total) by mouth daily.   PRESERVISION AREDS 2 PO Take 2 tablets by mouth daily.   saccharomyces boulardii 250 MG capsule Commonly known as: FLORASTOR Take 250 mg by mouth 2 (two) times daily.   Vitamin D3 125 MCG (5000 UT) Caps Take 1 capsule by mouth daily.       No Known Allergies  Consultations:  Palliative care    Procedures/Studies: US Renal  Result Date: 07/30/2019 CLINICAL DATA:  Renal failure EXAM: RENAL / URINARY TRACT ULTRASOUND COMPLETE COMPARISON:  CT abdomen 05/30/2019 FINDINGS: Right Kidney: Renal measurements: 8.2 by 5.4 by 3.4 cm = volume: 78 mL. Right renal cystic lesions of mildly varying complexity as shown on recent CT, probably complex cysts I without obvious internal blood flow although enhancement characteristics have not been recently assessed on CT. Renal echogenicity within normal limits. No stones or hydronephrosis. No mass or hydronephrosis. No definite calculi. Left Kidney: Renal measurements: 8.9 by 4.9 by 3.8 cm = volume: 86 mL. Left renal cystic lesions including a mid kidney cyst measuring 2.7 by 2.2 cm with some mild complexity internally, but no obvious internal blood flow. No mass or hydronephrosis. Bladder: Appears normal for degree of bladder distention. Other: None. IMPRESSION: 1. Some simple and some complex bilateral renal lesions as shown on recent CT, most likely cysts and without definite internal blood flow. Surveillance imaging may be warranted. 2. No hydronephrosis or urinary tract calculi identified. Electronically Signed   By: Van Clines M.D.   On: 07/30/2019 19:52   Dg Chest Port 1 View  Result Date:  07/30/2019 CLINICAL DATA:  Shortness of breath EXAM: PORTABLE CHEST 1 VIEW COMPARISON:  06/04/2019 FINDINGS: There is a subacute appearing fracture involving the proximal right humerus. This was likely present on the prior study. There is no pneumothorax. There is atelectasis at the left lung base with a small left-sided pleural effusion. There is no definite acute osseous abnormality. There are subacute rib fractures involving the seventh and eighth ribs posteriorly on the right. There is no pneumothorax. IMPRESSION: 1. Low lung volumes. Persistent atelectasis with a small left-sided effusion at the left lung base. Probable trace to small right-sided pleural effusion. 2. Subacute healing right humeral fracture. 3. Subacute right-sided rib fractures involving the seventh and eighth ribs posteriorly. Electronically Signed   By: Constance Holster M.D.   On: 07/30/2019 17:43       Discharge Exam: Vitals:   08/04/19 0502 08/04/19 0907  BP: 125/61 (!) 118/55  Pulse: 69 75  Resp: 20 18  Temp: 98.2 F (36.8 C) 97.8 F (36.6 C)  SpO2: 96% 94%    General: Pt is alert, awake, not in acute distress Cardiovascular: RRR, S1/S2 +, no edema, +systolic murmur  Respiratory: CTA bilaterally, no wheezing, no rhonchi, no respiratory distress, no conversational dyspnea  Abdominal: Soft, NT, ND, bowel sounds + Extremities: no edema, no  cyanosis Psych: stable     The results of significant diagnostics from this hospitalization (including imaging, microbiology, ancillary and laboratory) are listed below for reference.     Microbiology: Recent Results (from the past 240 hour(s))  SARS CORONAVIRUS 2 (TAT 6-24 HRS) Nasopharyngeal Nasopharyngeal Swab     Status: None   Collection Time: 07/30/19  5:27 PM   Specimen: Nasopharyngeal Swab  Result Value Ref Range Status   SARS Coronavirus 2 NEGATIVE NEGATIVE Final    Comment: (NOTE) SARS-CoV-2 target nucleic acids are NOT DETECTED. The SARS-CoV-2 RNA is  generally detectable in upper and lower respiratory specimens during the acute phase of infection. Negative results do not preclude SARS-CoV-2 infection, do not rule out co-infections with other pathogens, and should not be used as the sole basis for treatment or other patient management decisions. Negative results must be combined with clinical observations, patient history, and epidemiological information. The expected result is Negative. Fact Sheet for Patients: SugarRoll.be Fact Sheet for Healthcare Providers: https://www.woods-mathews.com/ This test is not yet approved or cleared by the [place] FDA and  has been authorized for detection and/or diagnosis of SARS-CoV-2 by FDA under an Emergency Use Authorization (EUA). This EUA will remain  in effect (meaning this test can be used) for the duration of the COVID-19 declaration under Section 56 4(b)(1) of the Act, 21 U.S.C. section 360bbb-3(b)(1), unless the authorization is terminated or revoked sooner. Performed at [facility], [place] [address], [place], [place] [address]   Urine culture     Status: Abnormal   Collection Time: 07/31/19  6:47 AM   Specimen: Urine, Random  Result Value Ref Range Status   Specimen Description URINE, RANDOM  Final   Special Requests   Final    NONE Performed at [facility], [place] [address] [place], [place] [address]    Culture MULTIPLE SPECIES PRESENT, SUGGEST RECOLLECTION (A)  Final   Report Status 08/01/2019 FINAL  Final     Labs: BNP (last 3 results) Recent Labs    07/27/19 1500 07/30/19 1720  BNP 758.8* 607.3*   Basic Metabolic Panel: Recent Labs  Lab 07/31/19 0337 08/01/19 0514 08/02/19 0518 08/02/19 1214 08/03/19 0339 08/04/19 0421  NA 136 138 139 138 140 138  K 3.7 3.8 3.6 3.9 3.5 3.2*  CL 107 105 103 100 105 99  CO2 12* 15* 16* 17* 18* 21*  GLUCOSE 97 96 87 98 99 128*  BUN 97* 98* 101* 103* 100* 96*  CREATININE  7.23* 7.38* 8.16* 8.30* 8.26* 8.46*  CALCIUM 7.8* 7.5* 7.4* 7.6* 7.4* 7.0*  MG 2.1 1.9 1.9  --  1.9  --   PHOS 5.8* 5.8* 6.1*  --   --   --    Liver Function Tests: Recent Labs  Lab 08/01/19 0514 08/02/19 0518 08/02/19 1214 08/03/19 0339 08/04/19 0421  AST 10* 12* 14* 11* 11*  ALT 6 6 6 6 6   ALKPHOS 142* 140* 156* 126 113  BILITOT 0.3 0.5 0.4 0.5 0.6  PROT 5.0* 4.9* 5.6* 4.7* 4.5*  ALBUMIN 2.1* 2.0* 2.2* 1.9* 1.7*   No results for input(s): LIPASE, AMYLASE in the last 168 hours. No results for input(s): AMMONIA in the last 168 hours. CBC: Recent Labs  Lab 07/30/19 1719  07/31/19 0337 08/01/19 0514 08/02/19 0518 08/03/19 0339 08/04/19 0421  WBC 15.5*  --  12.5* 13.2* 12.6* 11.4* 8.5  NEUTROABS 12.3*  --   --   --   --  8.8*  6.2  HGB 10.1*   < > 10.3* 10.4* 10.5* 9.6* 9.0*  HCT 32.9*   < > 32.7* 32.1* 32.5* 29.7* 27.6*  MCV 91.9  --  88.9 88.2 86.7 87.6 86.0  PLT 360  --  361 349 343 338 285   < > = values in this interval not displayed.   Cardiac Enzymes: No results for input(s): CKTOTAL, CKMB, CKMBINDEX, TROPONINI in the last 168 hours. BNP: Invalid input(s): POCBNP CBG: No results for input(s): GLUCAP in the last 168 hours. D-Dimer No results for input(s): DDIMER in the last 72 hours. Hgb A1c No results for input(s): HGBA1C in the last 72 hours. Lipid Profile No results for input(s): CHOL, HDL, LDLCALC, TRIG, CHOLHDL, LDLDIRECT in the last 72 hours. Thyroid function studies No results for input(s): TSH, T4TOTAL, T3FREE, THYROIDAB in the last 72 hours.  Invalid input(s): FREET3 Anemia work up No results for input(s): VITAMINB12, FOLATE, FERRITIN, TIBC, IRON, RETICCTPCT in the last 72 hours. Urinalysis    Component Value Date/Time   COLORURINE YELLOW 07/31/2019 0650   APPEARANCEUR HAZY (A) 07/31/2019 0650   LABSPEC 1.013 07/31/2019 0650   PHURINE 5.0 07/31/2019 0650   GLUCOSEU NEGATIVE 07/31/2019 0650   HGBUR NEGATIVE 07/31/2019 0650   BILIRUBINUR  NEGATIVE 07/31/2019 0650   [place] 07/31/2019 0650   PROTEINUR 30 (A) 07/31/2019 0650   NITRITE NEGATIVE 07/31/2019 0650   LEUKOCYTESUR LARGE (A) 07/31/2019 0650   Sepsis Labs Invalid input(s): PROCALCITONIN,  WBC,  LACTICIDVEN Microbiology Recent Results (from the past 240 hour(s))  SARS CORONAVIRUS 2 (TAT 6-24 HRS) Nasopharyngeal Nasopharyngeal Swab     Status: None   Collection Time: 07/30/19  5:27 PM   Specimen: Nasopharyngeal Swab  Result Value Ref Range Status   SARS Coronavirus 2 NEGATIVE NEGATIVE Final    Comment: (NOTE) SARS-CoV-2 target nucleic acids are NOT DETECTED. The SARS-CoV-2 RNA is generally detectable in upper and lower respiratory specimens during the acute phase of infection. Negative results do not preclude SARS-CoV-2 infection, do not rule out co-infections with other pathogens, and should not be used as the sole basis for treatment or other patient management decisions. Negative results must be combined with clinical observations, patient history, and epidemiological information. The expected result is Negative. Fact Sheet for Patients: SugarRoll.be Fact Sheet for Healthcare Providers: https://www.woods-mathews.com/ This test is not yet approved or cleared by the [place] FDA and  has been authorized for detection and/or diagnosis of SARS-CoV-2 by FDA under an Emergency Use Authorization (EUA). This EUA will remain  in effect (meaning this test can be used) for the duration of the COVID-19 declaration under Section 56 4(b)(1) of the Act, 21 U.S.C. section 360bbb-3(b)(1), unless the authorization is terminated or revoked sooner. Performed at [facility], [place] [address], [place], [place] [address]   Urine culture     Status: Abnormal   Collection Time: 07/31/19  6:47 AM   Specimen: Urine, Random  Result Value Ref Range Status   Specimen Description URINE, RANDOM  Final   Special Requests    Final    NONE Performed at [facility], [place] [address], [place], [place] [address]    Culture MULTIPLE SPECIES PRESENT, SUGGEST RECOLLECTION (A)  Final   Report Status 08/01/2019 FINAL  Final     Patient was seen and examined on the day of discharge and was found to be in stable condition. Time coordinating discharge: 45 minutes including assessment and coordination of care, as well as examination  of the patient.   SIGNED:  Dessa Phi, DO [facility] 08/04/2019, 9:53 AM

## 2019-08-04 NOTE — Clinical Social Work Note (Signed)
Patient discharging home today via ambulance, as all DME has been delivered. Transport arranged with [place]).   Bora Broner Givens, MSW, LCSW Licensed Clinical Social Worker [place] [phone]

## 2019-08-04 NOTE — Progress Notes (Signed)
DISCHARGE NOTE HOME SHIASIA PORRO to be discharged Home per MD order. Discussed prescriptions and follow up appointments with the patient. Prescriptions given to patient; medication list explained in detail. Patient verbalized understanding.  Skin clean, dry and intact without evidence of skin break down, no evidence of skin tears noted. IV catheter discontinued intact. Site without signs and symptoms of complications. Dressing and pressure applied. Pt denies pain at the site currently. No complaints noted.  Patient free of lines, drains, and wounds.   An After Visit Summary (AVS) was printed and given to the patient. Patient escorted via wheelchair, and discharged home via private auto.  Arlyss Repress, RN

## 2019-08-10 ENCOUNTER — Ambulatory Visit (HOSPITAL_COMMUNITY)
Admission: RE | Admit: 2019-08-10 | Discharge: 2019-08-10 | Disposition: A | Discharge status: Home / Self Care | Payer: PPO | Source: Ambulatory Visit | Attending: Cardiology | Admitting: Cardiology

## 2019-08-10 ENCOUNTER — Other Ambulatory Visit: Payer: Self-pay

## 2019-08-10 DIAGNOSIS — I5022 Chronic systolic (congestive) heart failure: Secondary | ICD-10-CM

## 2019-08-10 NOTE — Progress Notes (Signed)
Virtual Visit via Telephone Note   This visit type was conducted due to national recommendations for restrictions regarding the COVID-19 Pandemic (e.g. social distancing) in an effort to limit this patient's exposure and mitigate transmission in our community.  Due to her co-morbid illnesses, this patient is at least at moderate risk for complications without adequate follow up.  This format is felt to be most appropriate for this patient at this time.  The patient did not have access to video technology/had technical difficulties with video requiring transitioning to audio format only (telephone).  All issues noted in this document were discussed and addressed.  No physical exam could be performed with this format.  Please refer to the patient's chart for her  consent to telehealth for [facility].   Date:  08/10/2019   ID:  Mary Guzman, DOB 09-21-1920, MRN [id number]  Patient Location: Home Provider Location: Office  PCP:  [name], NP  Cardiologist:  Dr. Aundra Dubin  Electrophysiologist:  None   Evaluation Performed:  Follow-Up Visit  Chief Complaint:  Chronic Diastolic HF/ Post Hospital F/u A/C Renal Failure   History of Present Illness:     Mary Guzman is a 85 y.o. female who has a history of breast cancer in the 1990s, HTN, CKD stage 3-4 and CHF. She lives alone but daughter helps. She dates her symptoms to around the time when she developed severe hip pain in 8/16. She was under a lot of stress with the severe pain and ended up getting steroid injections. She developed exertional dyspnea around that time. However, for >1 year she has had significant lower extremity edema. She got to the point where she was short of breath after walking about 15-20 feet. No chest pain. CXR in 9/16 showed mild pulmonary edema. This improved with increase in Lasix to 40 qam and every other day 20 mg pm Lasix. Repeat echo was done in 11/17. This showed improvement in EF to 55%. Echo in  11/18 showed that EF remains 55%.   Seen in ER 05/2018 with hyperkalemia and anemia.   In 9/20, she was admitted after tripping and falling with a right humeral fracture. Later in 9/20, she developed acute appendicitis and had an appendectomy.  She tolerated this well.   She was discharged to [facility].  After her appendectomy, she gained significant weight.  Appraently was not getting Lasix while in the hospital and gained about 20 lbs.  She was later placed back on Lasix 40 mg daily w/ subsequent wt loss. She had recent f/u w/ Dr. Aundra Dubin on 10/20. She still had significant lower extremity edema but had been workind w/ PT without dyspnea.  No orthopnea/PND.  No chest pain. REDS clip 28%. Given marked peripheral edema, Dr. Aundra Dubin increased her Lasix to 40 mg bid for 4 days then back to 40 mg daily.  Pt instructed to return in 3 weeks for f/u.  I evaluated her on 11/10 for f/u.  She was very weak. In wheelchair. Unable to stand on scale for wt, but she reported her weight at nursing facility was 126 lb (down from 134 lb previous clinic visit). BP was low at 86/54. Pulse rate was 76 bpm. ReDs clip 29%. She also endorsed 1 week h/o diarrhea. Appetite had been poor. We checked labs and found that she had worsening renal failure w/ SCr from 2.4>>5.34. K was 3.5. SNF was contacted regarding results and pt instructed to stop Lasix and go to ED for abnormal labs.  She was admitted by IM for acute renal failure. Nephrology consulted and pt not a candidate for HD. Renal failure progressed and SCr further rose to 8 range. Palliative care recommended and pt discharged home w/ Hospice. All scheduled prescriptions meds were discontinued. Pt only receiving PRN meds for comfort care.   Spoke w/ daughter today. Pt comfortable. No respiratory distress. Has hospital bed. Hospice RN visits 3 days a week. Appetite is poor. Per daughter, they were told life expectancy < 6 wks.   The patient does not have symptoms  concerning for COVID-19 infection (fever, chills, cough, or new shortness of breath).    Past Medical History:  Diagnosis Date  . Cancer ([place])    Breast Hx of stage IIB, moderately differentiated with 3 of 8 possible lymph nodes.  . DJD (degenerative joint disease)   . GERD (gastroesophageal reflux disease)   . Hypertension   . Osteoporosis   . Thyroid disease    Hypothyroidism   Past Surgical History:  Procedure Laterality Date  . ANKLE SURGERY     Right  . INTRACAPSULAR CATARACT EXTRACTION    . LAPAROSCOPIC APPENDECTOMY N/A 06/02/2019   Procedure: APPENDECTOMY LAPAROSCOPIC;  Surgeon: Alphonsa Overall, MD;  Location: [facility];  Service: General;  Laterality: N/A;  . MASTECTOMY  1994     No outpatient medications have been marked as taking for the 08/10/19 encounter [facility] Encounter) with [place].     Allergies:   Patient has no known allergies.   Social History   Tobacco Use  . Smoking status: Never Smoker  . Smokeless tobacco: Current User    Types: Chew  Substance Use Topics  . Alcohol use: No  . Drug use: Not on file     Family Hx: The patient's family history includes Alcohol abuse in her brother; Asthma in her son.  ROS:   Please see the history of present illness.     All other systems reviewed and are negative.   Prior CV studies:   The following studies were reviewed today:  Echo 3/18: EF 55%, G1DD  Labs/Other Tests and Data Reviewed:    EKG:  No ECG reviewed.  Recent Labs: 07/30/2019: B Natriuretic Peptide 474.5 08/03/2019: Magnesium 1.9 08/04/2019: ALT 6; BUN 96; Creatinine, Ser 8.46; Hemoglobin 9.0; Platelets 285; Potassium 3.2; Sodium 138   Recent Lipid Panel Lab Results  Component Value Date/Time   CHOL 195 12/06/2008 10:26 AM   TRIG 133.0 12/06/2008 10:26 AM   HDL 54.30 12/06/2008 10:26 AM   CHOLHDL 4 12/06/2008 10:26 AM   LDLCALC 114 (H) 12/06/2008 10:26 AM   LDLDIRECT 142.5 12/08/2007 09:59 AM    Wt Readings from Last 3  Encounters:  08/04/19 62.3 kg (137 lb 5.6 oz)  07/27/19 57.2 kg (126 lb)  07/06/19 60.8 kg (134 lb)     Objective:    Vital Signs:  There were no vitals taken for this visit.   VITAL SIGNS:  N/a Physical Exam  N/A- unable to speak w/ pt given current status, spoke w/ daughter. Pt comfort care w/ Hospice.  ASSESSMENT & PLAN:    1. Chronic Diastolic HF: now comfort care w/ Hospice due to ESRD, not a candidate for HD. Lasix recently discontinued. All cardiac meds discontinued at recent hospitalization. Only PRN meds for comfort care. Per daughter, life expectancy < 6 weeks.   - Expressed condolences. No further cardiac care.   COVID-19 Education: The signs and symptoms of COVID-19 were discussed with the patient and how  to seek care for testing (follow up with PCP or arrange E-visit).  The importance of social distancing was discussed today.  Time:   Today, I have spent 5 minutes with the patient with telehealth technology discussing the above problems.     Medication Adjustments/Labs and Tests Ordered: Current medicines are reviewed at length with the patient today.  Concerns regarding medicines are outlined above.   Tests Ordered: No orders of the defined types were placed in this encounter.   Medication Changes: No orders of the defined types were placed in this encounter.   Follow Up:  No f/u.   Signed, Lyda Jester, PA-C  08/10/2019 3:19 PM    [facility]

## 2019-08-26 ENCOUNTER — Telehealth: Payer: Self-pay | Admitting: Hematology and Oncology

## 2019-08-26 ENCOUNTER — Telehealth: Payer: Self-pay | Admitting: *Deleted

## 2019-08-26 NOTE — Telephone Encounter (Signed)
Daughter called and left a message- need to cancel future appointments. Patient passed away 09-19-2019. Appts cancelled as requested. Returned telephone call to offer condolences- message left.

## 2019-08-26 NOTE — Telephone Encounter (Signed)
Patient daughter [name] left message to cx January appointments due patient passed away. Voicemail forwarded to desk nurse. Left message for dtr confirming receiving her message, offering condolences and letting her know appointments were cancelled.

## 2019-09-17 DEATH — deceased

## 2019-09-20 ENCOUNTER — Ambulatory Visit: Payer: PPO

## 2019-09-20 ENCOUNTER — Ambulatory Visit: Payer: PPO | Admitting: Hematology and Oncology

## 2019-09-20 ENCOUNTER — Other Ambulatory Visit: Payer: PPO

## 2019-09-21 ENCOUNTER — Ambulatory Visit: Payer: PPO | Admitting: Hematology and Oncology

## 2019-09-21 ENCOUNTER — Other Ambulatory Visit: Payer: PPO

## 2019-09-21 ENCOUNTER — Ambulatory Visit: Payer: PPO

## 2021-07-24 IMAGING — CT CT ABD-PELV W/O CM
2 of 3 series · 15 of 42 positions shown, 18 images · non-contrast
Comparison: None.

CLINICAL DATA: Acute, diffuse abdominal pain following a fall due
to a syncopal episode.

EXAM:
CT ABDOMEN AND PELVIS WITHOUT CONTRAST
TECHNIQUE: Multidetector CT imaging of the abdomen and pelvis was performed
following the standard protocol without IV contrast.

[Series 3: a/p w/o 5mm · axial · non-contrast · 0.70mm/px · z∈[-794,-389]mm · 12 of 93 slices shown, 15 images]
[im 8/93  soft-tissue]
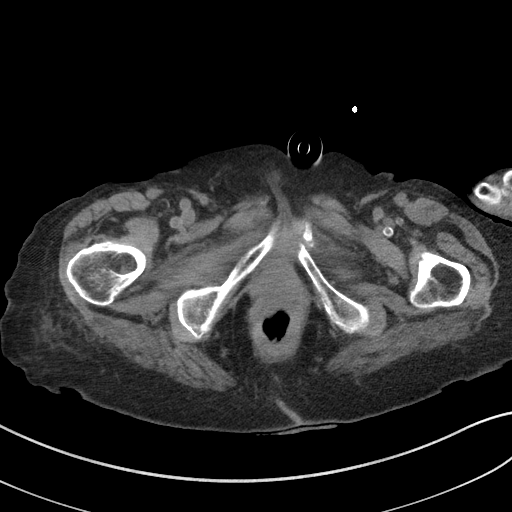
[im 8/93  bone]
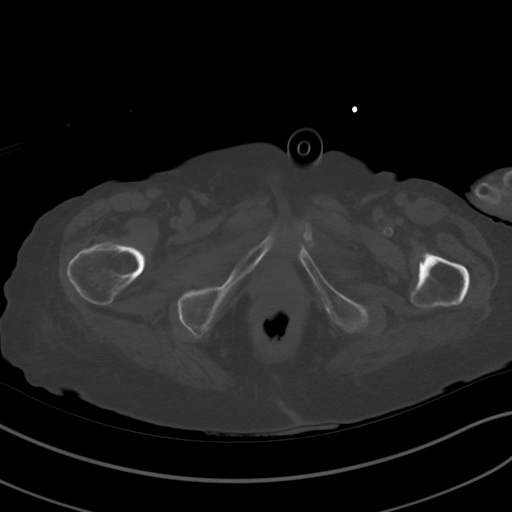
[im 18/93  soft-tissue]
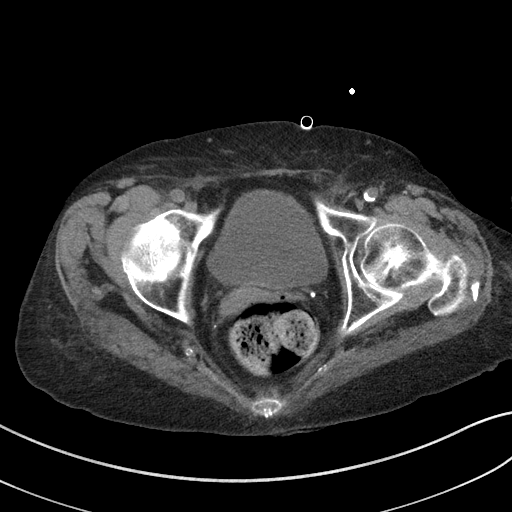
[im 25/93  soft-tissue]
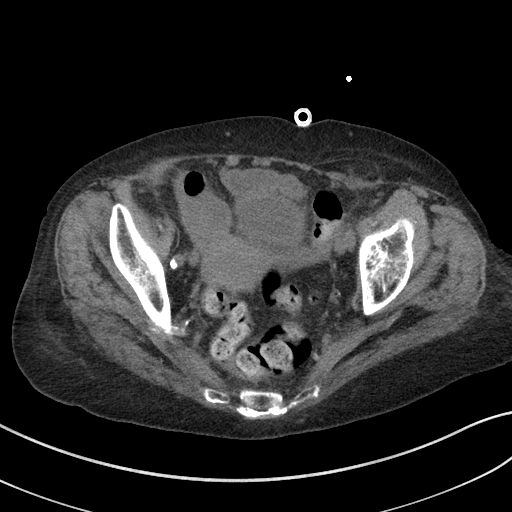
[im 36/93  soft-tissue]
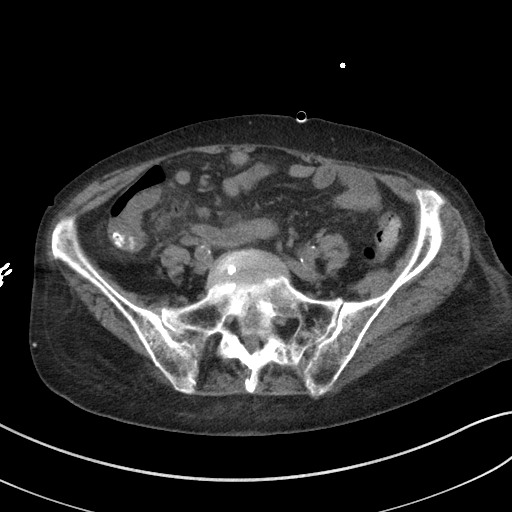
[im 47/93  soft-tissue]
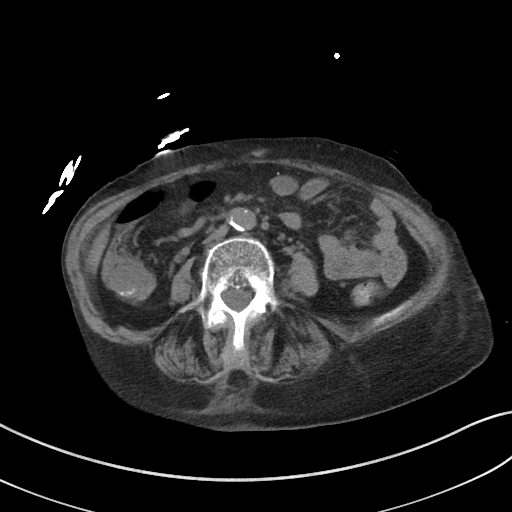
[im 57/93  soft-tissue]
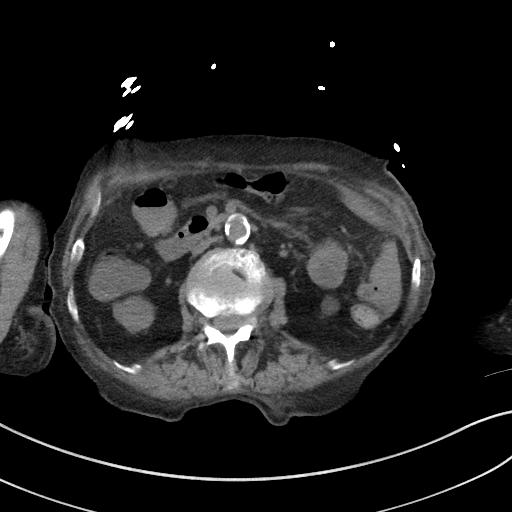
[im 68/93  soft-tissue]
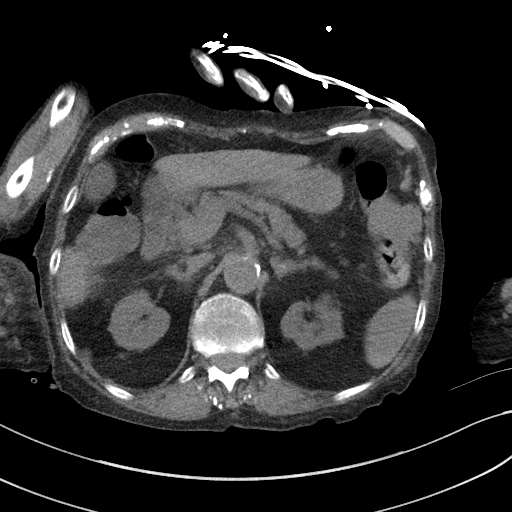
[im 75/93  soft-tissue]
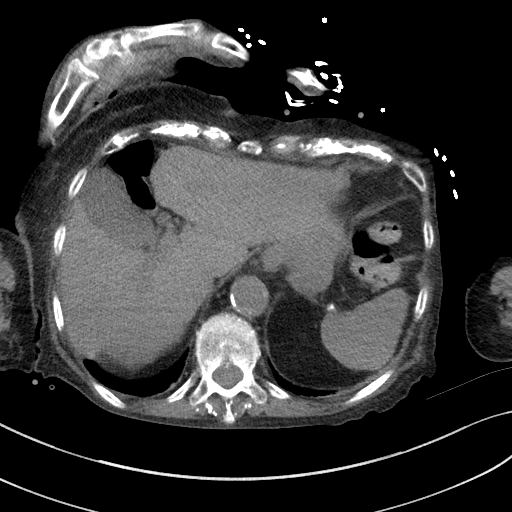
[im 78/93  lung]
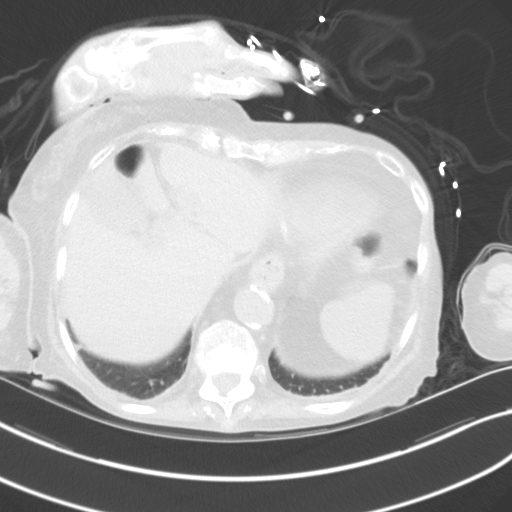
[im 82/93  lung]
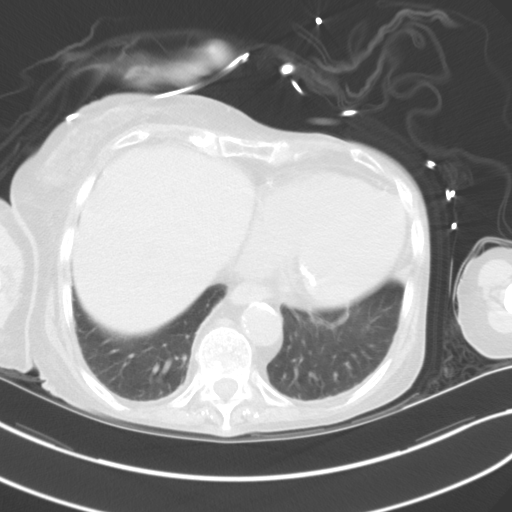
[im 85/93  soft-tissue]
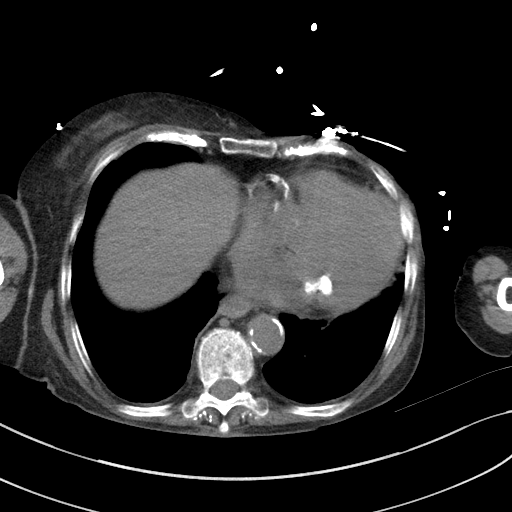
[im 85/93  lung]
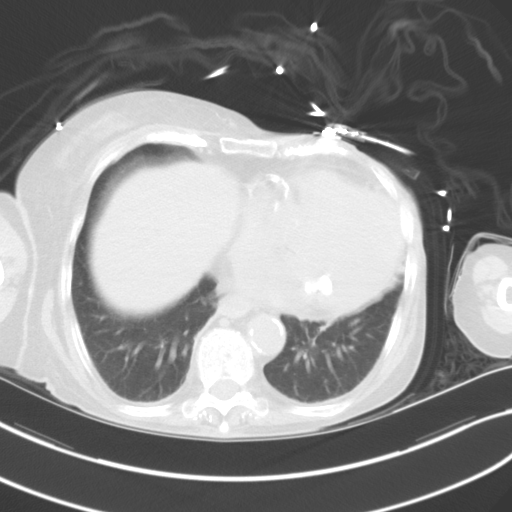
[im 85/93  bone]
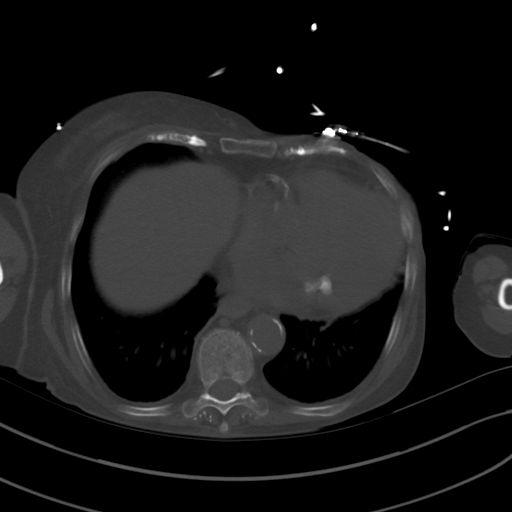
[im 89/93  lung]
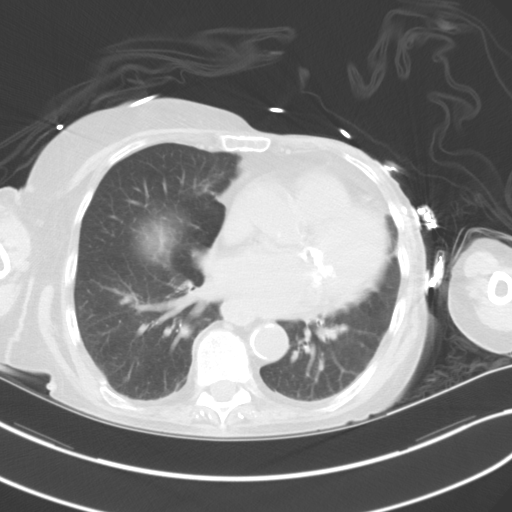

[Series 6: a/p w/o cor · coronal · non-contrast · 0.78mm/px · 3 of 121 slices shown]
[im 41/121  soft-tissue]
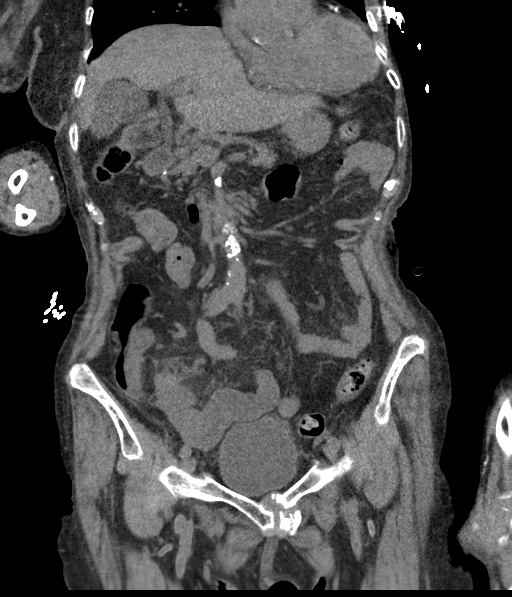
[im 54/121  soft-tissue]
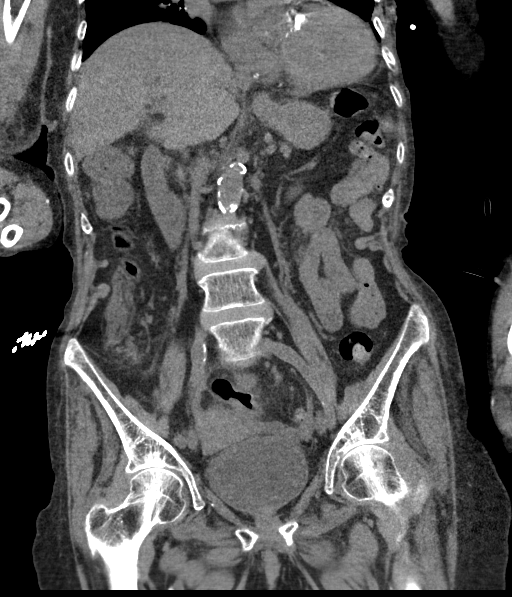
[im 67/121  soft-tissue]
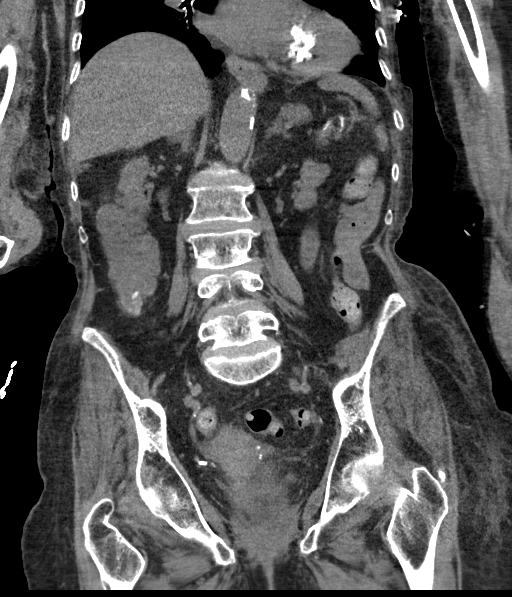

[15 of 42 positions shown; findings below may reference images not displayed]

FINDINGS: Lower chest: Enlarged heart. Dense mitral valve annulus
calcifications found coronary artery calcifications. Minimal linear
atelectasis or scarring at the right lung base.

Hepatobiliary: No focal liver abnormality is seen. No gallstones,
gallbladder wall thickening, or biliary dilatation.

Pancreas: Moderate diffuse pancreatic atrophy.

Spleen: Normal in size without focal abnormality.

Adrenals/Urinary Tract: Normal appearing adrenal glands. Multiple
rounded, exophytic bilateral renal masses and left renal parapelvic
cysts. The exophytic masses range between water density and a
maximum of 34 Hounsfield units in density. Unremarkable urinary
bladder. No visible ureteral abnormalities. No calculi or
hydronephrosis.

Stomach/Bowel: Small hiatal hernia. Enlarged appendix containing
multiple appendicoliths. The appendix measures up to 12 mm in
diameter. Mild adjacent soft tissue stranding and fat induration.
Small focal area of adjacent gas with a somewhat linear
configuration. The appendix origin is in the upper pelvis on the
right. The appendix extends horizontally toward the midline.
Unremarkable small bowel and colon.

Vascular/Lymphatic: Atheromatous arterial calcifications without
aneurysm. No enlarged lymph nodes.

Reproductive: Uterus and bilateral adnexa are unremarkable.

Other: No free peritoneal fluid or air.

Musculoskeletal: Comminuted left pubic body fracture with sclerosis
and corticated fragments. Not nonunion of the fragments. No acute
fractures are seen. Left sacral bone island.
IMPRESSION: 1. Acute appendicitis with multiple appendicoliths and a small
focal, contained perforation without abscess.
2. No evidence of acute abdominal or pelvic injury.
3. Comminuted left pubic body fracture with sclerosis and corticated
fragments, compatible with an old fracture with nonunion.
4. Small hiatal hernia.
5. Cardiomegaly.
6. Dense mitral valve annulus and coronary artery atherosclerosis.
7. Multiple bilateral renal masses, as described above. These could
represent a combination of simple and complicated cysts and/or solid
masses.

## 2021-07-24 IMAGING — CT CT HEAD W/O CM
4 series · 17 of 47 positions shown, 19 images · non-contrast
Comparison: None.

CLINICAL DATA: Syncopal episode and confusion today. Fell
yesterday.

EXAM:
CT HEAD WITHOUT CONTRAST
TECHNIQUE: Contiguous axial images were obtained from the base of the skull
through the vertex without intravenous contrast.

[Series 3: head without · axial · non-contrast · 0.40mm/px · z∈[-130,-5]mm · 7 of 35 slices shown, 9 images]
[im 5/35  brain]
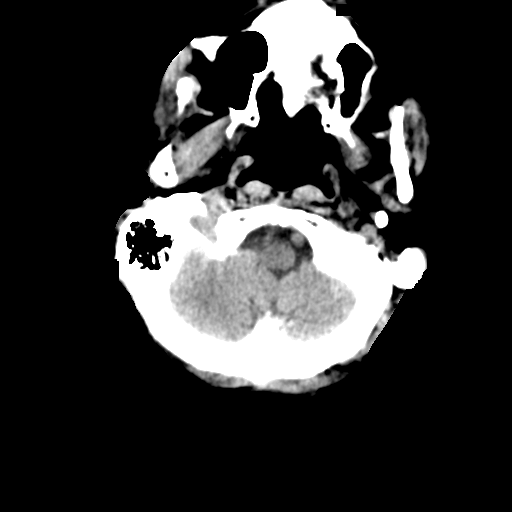
[im 5/35  bone]
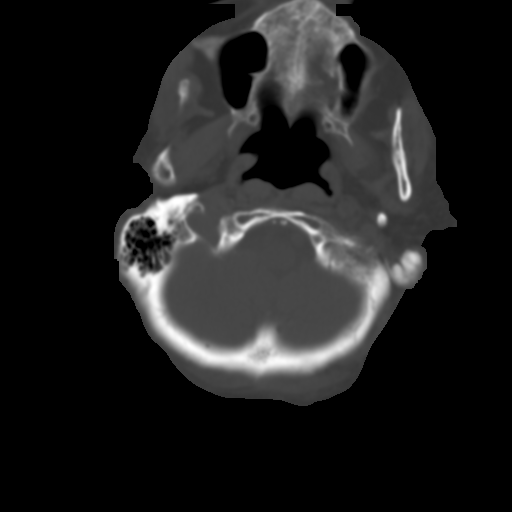
[im 9/35  brain]
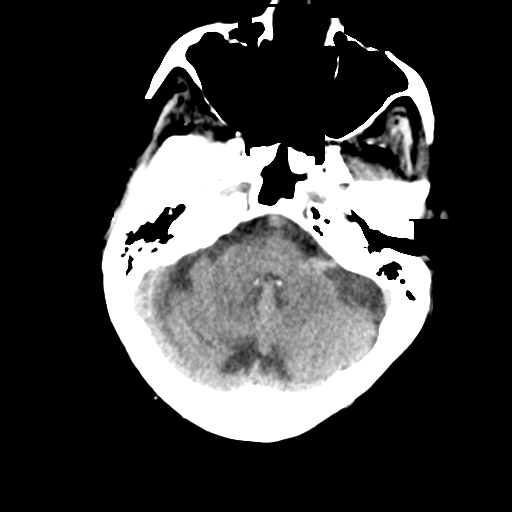
[im 13/35  brain]
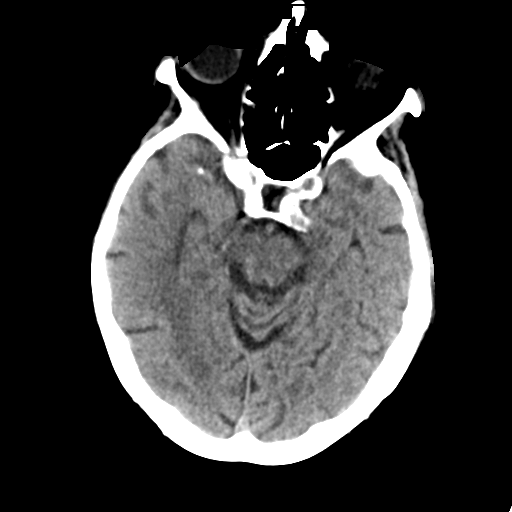
[im 18/35  brain]
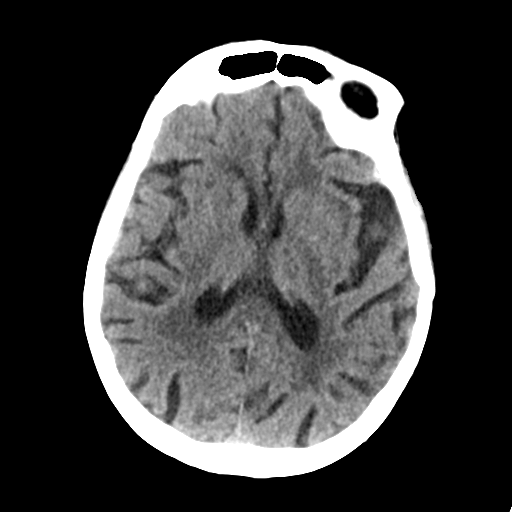
[im 22/35  brain]
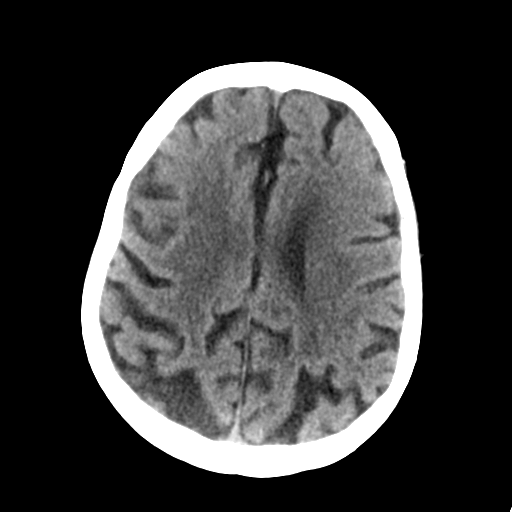
[im 22/35  bone]
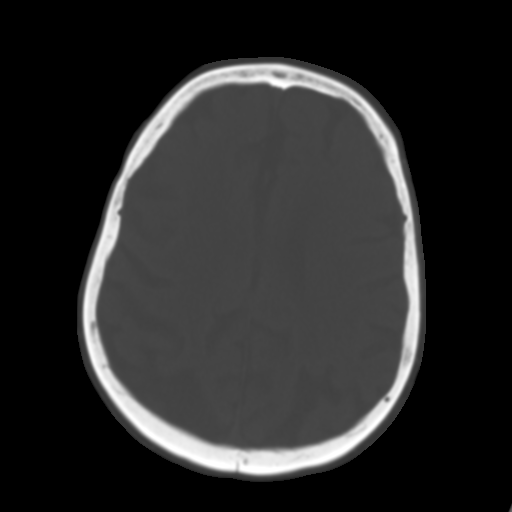
[im 26/35  brain]
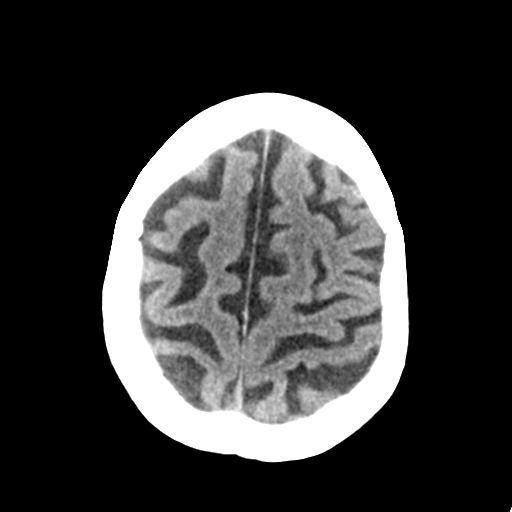
[im 30/35  brain]
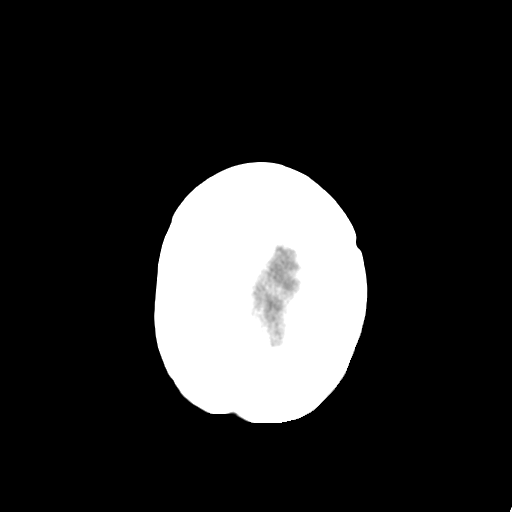

[Series 4: head bone · axial · 0.40mm/px · z∈[-134,-74]mm · 4 of 86 slices shown]
[im 9/86  bone]
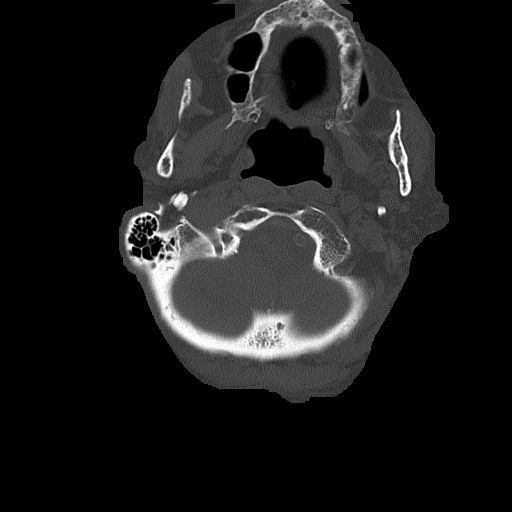
[im 18/86  bone]
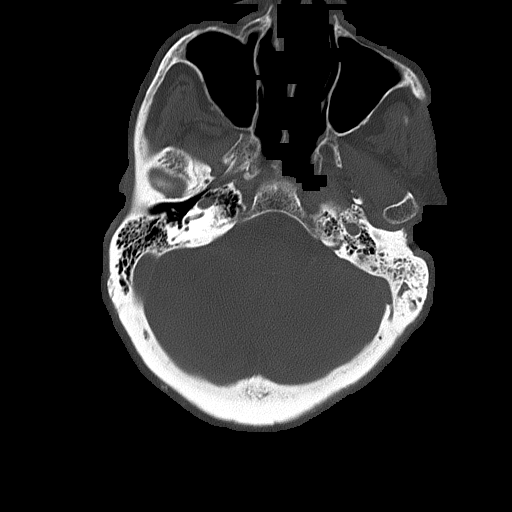
[im 26/86  bone]
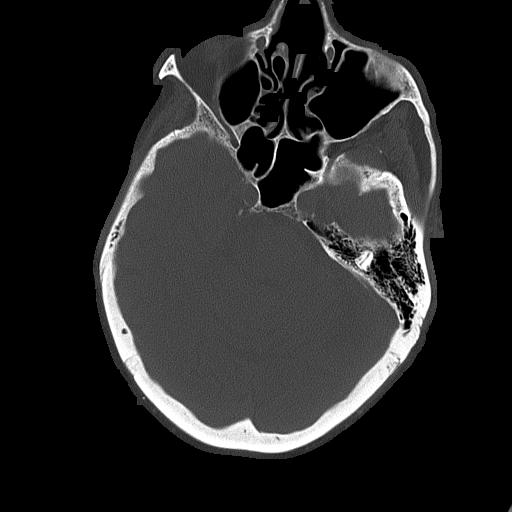
[im 39/86  bone]
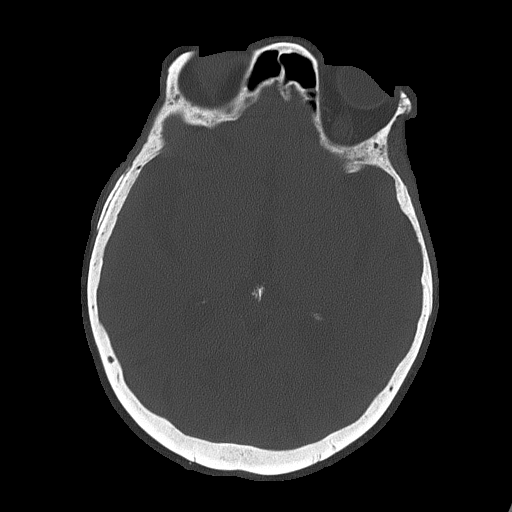

[Series 5: head without cor · coronal · non-contrast · 0.33mm/px · 3 of 67 slices shown]
[im 23/67  brain]
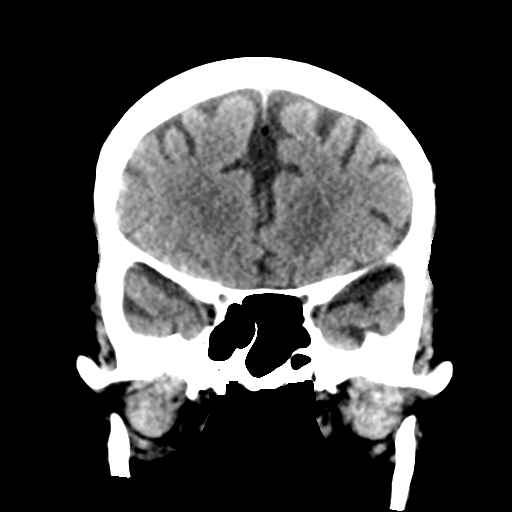
[im 30/67  brain]
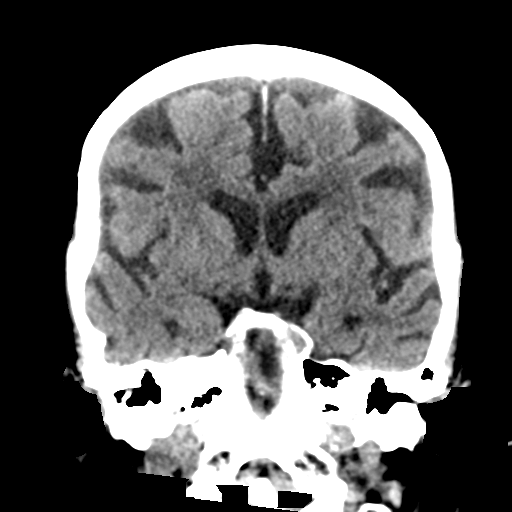
[im 37/67  brain]
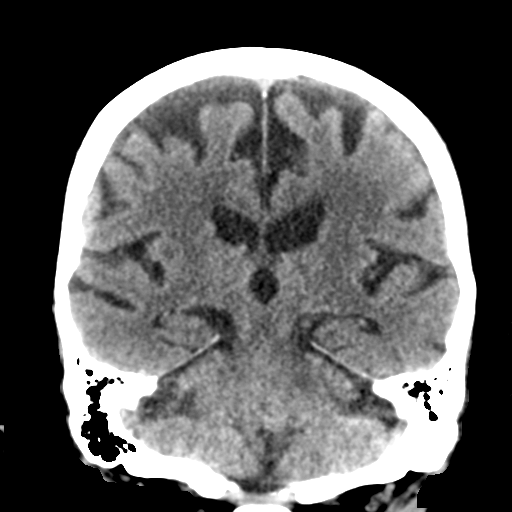

[Series 6: head without sag · sagittal · non-contrast · 0.33mm/px · 3 of 51 slices shown]
[im 17/51  brain]
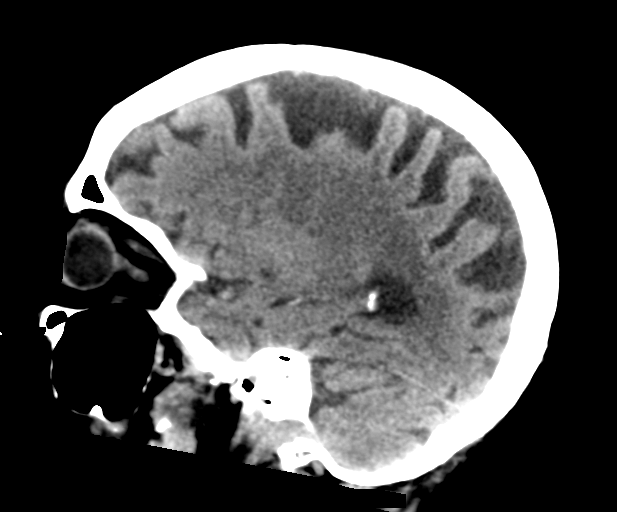
[im 26/51  brain]
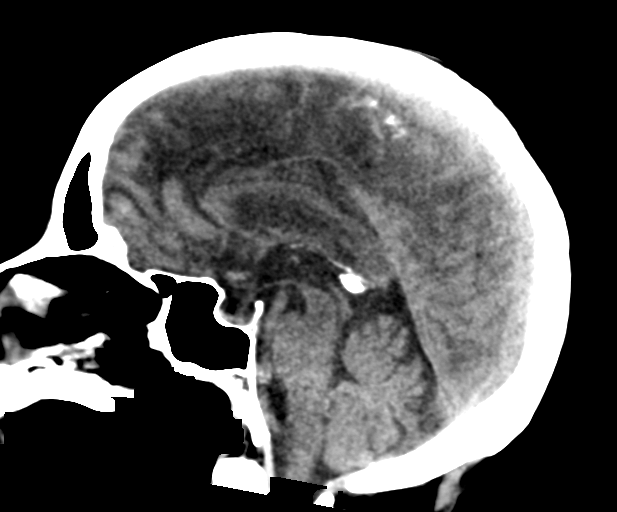
[im 34/51  brain]
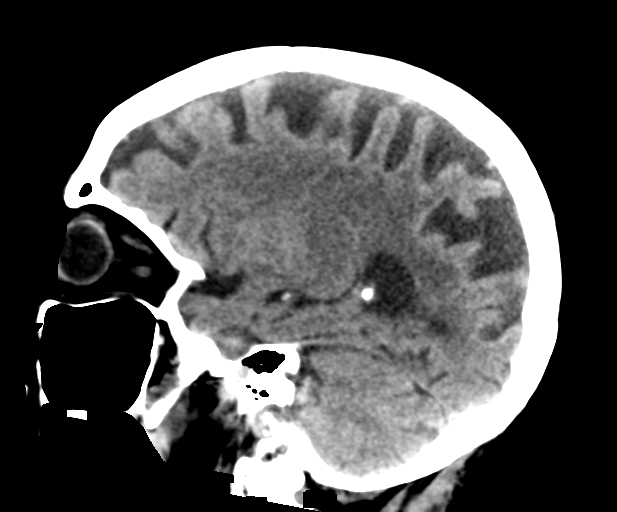

[17 of 47 positions shown; findings below may reference images not displayed]

FINDINGS: Brain: Moderately enlarged ventricles and subarachnoid spaces.
Moderate patchy white matter low density in both cerebral
hemispheres. Old left thalamus lacunar infarct. No intracranial
hemorrhage, mass lesion or CT evidence of acute infarction.

Vascular: No hyperdense vessel or unexpected calcification.

Skull: Normal. Negative for fracture or focal lesion.

Sinuses/Orbits: Status post bilateral cataract extraction.
Unremarkable bones and included paranasal sinuses.

Other: None.
IMPRESSION: 1. No acute abnormality.
2. Moderate diffuse cerebral and cerebellar atrophy.
3. Moderate chronic small vessel white matter ischemic changes in
both cerebral hemispheres.
4. Old left thalamus lacunar infarct.
